# Patient Record
Sex: Male | Born: 1935 | Race: Black or African American | Hispanic: No | Smoking: Former smoker
Health system: Southern US, Community
[De-identification: ages and names within clinical notes are randomized; demographics above are authoritative.]

## PROBLEM LIST (undated history)

## (undated) DIAGNOSIS — C679 Malignant neoplasm of bladder, unspecified: Secondary | ICD-10-CM

## (undated) DIAGNOSIS — R413 Other amnesia: Secondary | ICD-10-CM

## (undated) DIAGNOSIS — M1990 Unspecified osteoarthritis, unspecified site: Secondary | ICD-10-CM

## (undated) DIAGNOSIS — C801 Malignant (primary) neoplasm, unspecified: Secondary | ICD-10-CM

## (undated) DIAGNOSIS — I2699 Other pulmonary embolism without acute cor pulmonale: Secondary | ICD-10-CM

## (undated) DIAGNOSIS — J439 Emphysema, unspecified: Secondary | ICD-10-CM

## (undated) DIAGNOSIS — R42 Dizziness and giddiness: Secondary | ICD-10-CM

## (undated) DIAGNOSIS — N481 Balanitis: Secondary | ICD-10-CM

## (undated) DIAGNOSIS — I440 Atrioventricular block, first degree: Secondary | ICD-10-CM

## (undated) DIAGNOSIS — I719 Aortic aneurysm of unspecified site, without rupture: Secondary | ICD-10-CM

## (undated) DIAGNOSIS — R001 Bradycardia, unspecified: Secondary | ICD-10-CM

## (undated) DIAGNOSIS — R0602 Shortness of breath: Secondary | ICD-10-CM

## (undated) DIAGNOSIS — C61 Malignant neoplasm of prostate: Secondary | ICD-10-CM

## (undated) DIAGNOSIS — R3915 Urgency of urination: Secondary | ICD-10-CM

## (undated) DIAGNOSIS — K449 Diaphragmatic hernia without obstruction or gangrene: Secondary | ICD-10-CM

## (undated) DIAGNOSIS — Z8551 Personal history of malignant neoplasm of bladder: Secondary | ICD-10-CM

## (undated) DIAGNOSIS — Z960 Presence of urogenital implants: Secondary | ICD-10-CM

## (undated) DIAGNOSIS — I82409 Acute embolism and thrombosis of unspecified deep veins of unspecified lower extremity: Secondary | ICD-10-CM

## (undated) HISTORY — DX: Atrioventricular block, first degree: I44.0

## (undated) HISTORY — DX: Bradycardia, unspecified: R00.1

## (undated) HISTORY — DX: Dizziness and giddiness: R42

## (undated) HISTORY — PX: OTHER SURGICAL HISTORY: SHX169

## (undated) HISTORY — DX: Shortness of breath: R06.02

## (undated) HISTORY — DX: Malignant neoplasm of prostate: C61

## (undated) HISTORY — PX: FOOT SURGERY: SHX648

## (undated) HISTORY — DX: Unspecified osteoarthritis, unspecified site: M19.90

## (undated) HISTORY — DX: Balanitis: N48.1

---
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
## 1999-01-01 ENCOUNTER — Ambulatory Visit (HOSPITAL_COMMUNITY): Admission: RE | Admit: 1999-01-01 | Discharge: 1999-01-01 | Payer: Self-pay | Admitting: Family Medicine

## 1999-06-02 ENCOUNTER — Encounter: Admission: RE | Admit: 1999-06-02 | Discharge: 1999-06-02 | Payer: Self-pay | Admitting: Family Medicine

## 1999-06-02 ENCOUNTER — Encounter: Payer: Self-pay | Admitting: Family Medicine

## 2000-02-19 ENCOUNTER — Emergency Department (HOSPITAL_COMMUNITY): Admission: EM | Admit: 2000-02-19 | Discharge: 2000-02-19 | Payer: Self-pay | Admitting: Emergency Medicine

## 2000-02-20 ENCOUNTER — Encounter: Payer: Self-pay | Admitting: Emergency Medicine

## 2000-08-04 ENCOUNTER — Ambulatory Visit (HOSPITAL_COMMUNITY): Admission: RE | Admit: 2000-08-04 | Discharge: 2000-08-04 | Payer: Self-pay | Admitting: Family Medicine

## 2000-08-04 ENCOUNTER — Encounter: Payer: Self-pay | Admitting: Family Medicine

## 2000-08-16 DIAGNOSIS — Z8546 Personal history of malignant neoplasm of prostate: Secondary | ICD-10-CM

## 2000-08-16 HISTORY — DX: Personal history of malignant neoplasm of prostate: Z85.46

## 2000-11-23 ENCOUNTER — Ambulatory Visit (HOSPITAL_COMMUNITY): Admission: RE | Admit: 2000-11-23 | Discharge: 2000-11-23 | Payer: Self-pay | Admitting: Family Medicine

## 2000-11-23 ENCOUNTER — Encounter: Payer: Self-pay | Admitting: Family Medicine

## 2001-01-02 ENCOUNTER — Encounter (INDEPENDENT_AMBULATORY_CARE_PROVIDER_SITE_OTHER): Payer: Self-pay | Admitting: *Deleted

## 2001-01-02 ENCOUNTER — Other Ambulatory Visit: Admission: RE | Admit: 2001-01-02 | Discharge: 2001-01-02 | Payer: Self-pay | Admitting: Urology

## 2001-01-11 ENCOUNTER — Encounter: Payer: Self-pay | Admitting: Urology

## 2001-01-11 ENCOUNTER — Encounter: Admission: RE | Admit: 2001-01-11 | Discharge: 2001-01-11 | Payer: Self-pay | Admitting: Urology

## 2001-01-18 ENCOUNTER — Ambulatory Visit: Admission: RE | Admit: 2001-01-18 | Discharge: 2001-04-18 | Payer: Self-pay | Admitting: Radiation Oncology

## 2001-02-10 ENCOUNTER — Ambulatory Visit (HOSPITAL_COMMUNITY): Admission: RE | Admit: 2001-02-10 | Discharge: 2001-02-10 | Payer: Self-pay | Admitting: Radiation Oncology

## 2001-04-11 ENCOUNTER — Encounter: Payer: Self-pay | Admitting: Urology

## 2001-04-11 ENCOUNTER — Ambulatory Visit (HOSPITAL_COMMUNITY): Admission: RE | Admit: 2001-04-11 | Discharge: 2001-04-11 | Payer: Self-pay | Admitting: Urology

## 2001-06-08 ENCOUNTER — Ambulatory Visit: Admission: RE | Admit: 2001-06-08 | Discharge: 2001-09-06 | Payer: Self-pay | Admitting: Radiation Oncology

## 2006-06-16 ENCOUNTER — Emergency Department (HOSPITAL_COMMUNITY): Admission: EM | Admit: 2006-06-16 | Discharge: 2006-06-16 | Payer: Self-pay | Admitting: Emergency Medicine

## 2006-08-18 ENCOUNTER — Encounter: Admission: RE | Admit: 2006-08-18 | Discharge: 2006-08-18 | Payer: Self-pay | Admitting: Family Medicine

## 2007-11-20 ENCOUNTER — Ambulatory Visit (HOSPITAL_COMMUNITY): Admission: RE | Admit: 2007-11-20 | Discharge: 2007-11-20 | Payer: Self-pay | Admitting: Urology

## 2010-09-06 ENCOUNTER — Encounter: Payer: Self-pay | Admitting: Urology

## 2010-09-07 ENCOUNTER — Encounter: Payer: Self-pay | Admitting: Urology

## 2011-01-01 NOTE — Op Note (Signed)
[facility]  Patient:    KWINTON, MAAHS Visit Number: [id number] MRN: [id number]          Service Type: DSU Location: DAY Attending Physician:  Lindaann Slough Dictated by:   Lindaann Slough, M.D. Proc. Date: 04/11/01 Adm. Date:  04/11/2001   CC:         Maryln Gottron, M.D.   Operative Report  PREOPERATIVE DIAGNOSIS:  Adenocarcinoma of prostate.  POSTOPERATIVE DIAGNOSIS:  Adenocarcinoma of prostate.  OPERATION:  I-125 seed implantation.  SURGEON:  Lindaann Slough, M.D.           Maryln Gottron, M.D.  ANESTHESIA:  General.  INDICATIONS:  The patient is a 77 year old male who was found to have an elevated PSA at 14.77.  Prostate biopsy was positive for adenocarcinoma.  Bone scan is negative for metastatic disease.  Treatment options were discussed with the patient, and he chose to have radiation.  He was evaluated by Dr. Dayton Scrape and was scheduled for combination external beam and seed implantation.  He has completed the external beam radiation treatment, and he is scheduled now for seed implantation.  DESCRIPTION OF PROCEDURE:  The patient was placed in the dorsolithotomy position.  A Foley catheter and a rectal tube were placed in the bladder. Then a transducer was inserted in the rectum.  When the imaged obtained on the ultrasound were similar to the ones obtained on the simulation study, the anchors were placed in the prostate, and the seeds were implanted in the prostate.  This was done under ultrasound and fluoroscopy guidance.  There was good seed distribution.  A total of 101 seeds were implanted in the prostate. Then the Foley catheter was removed.  A flexible cystoscope was then passed in the bladder.  There was on seed in the stand protruding to the prostatic urethra.  This seed was grasped with a grasping forceps and removed.  Three seeds were in that strand.  So, the patient received a total of 98 seeds. there was no evidence  of stone or tumor in the bladder.  The ureteral orifices were in normal position and shape with clear efflux.  The cystoscope was then removed.  A #16 Foley catheter was then reinserted in the bladder.  The patient tolerated the procedure well and left the OR in satisfactory condition to postanesthesia care unit. Dictated by:   Lindaann Slough, M.D. Attending Physician:  Lindaann Slough DD:  04/11/01 TD:  04/11/01 Job: [id number]

## 2013-04-20 DIAGNOSIS — M4696 Unspecified inflammatory spondylopathy, lumbar region: Secondary | ICD-10-CM | POA: Insufficient documentation

## 2013-04-20 DIAGNOSIS — R972 Elevated prostate specific antigen [PSA]: Secondary | ICD-10-CM | POA: Insufficient documentation

## 2013-04-20 DIAGNOSIS — E785 Hyperlipidemia, unspecified: Secondary | ICD-10-CM | POA: Insufficient documentation

## 2013-10-01 ENCOUNTER — Encounter (HOSPITAL_COMMUNITY): Payer: Self-pay | Admitting: Emergency Medicine

## 2013-10-01 ENCOUNTER — Emergency Department (HOSPITAL_COMMUNITY)
Admission: EM | Admit: 2013-10-01 | Discharge: 2013-10-01 | Disposition: A | Discharge status: Home / Self Care | Payer: Commercial Managed Care - HMO | Source: Home / Self Care | Attending: Emergency Medicine | Admitting: Emergency Medicine

## 2013-10-01 DIAGNOSIS — K047 Periapical abscess without sinus: Secondary | ICD-10-CM

## 2013-10-01 DIAGNOSIS — E215 Disorder of parathyroid gland, unspecified: Secondary | ICD-10-CM

## 2013-10-01 MED ORDER — HYDROCODONE-ACETAMINOPHEN 5-325 MG PO TABS
ORAL_TABLET | ORAL | Status: AC
  Filled 2013-10-01: qty 1

## 2013-10-01 MED ORDER — OXYCODONE-ACETAMINOPHEN 5-325 MG PO TABS: ORAL_TABLET | ORAL | Status: DC

## 2013-10-01 MED ORDER — HYDROCODONE-ACETAMINOPHEN 5-325 MG PO TABS
1.0000 | ORAL_TABLET | Freq: Once | ORAL | Status: AC
  Administered 2013-10-01: 1 via ORAL

## 2013-10-01 MED ORDER — CLINDAMYCIN HCL 300 MG PO CAPS: 300.0000 mg | ORAL_CAPSULE | Freq: Four times a day (QID) | ORAL | Status: DC

## 2013-10-01 NOTE — ED Notes (Signed)
C/o right lower tooth pain and swelling.  On set Friday.  Mild relief with tylenol.  Denies drainage and any other symptoms.

## 2013-10-01 NOTE — Discharge Instructions (Signed)
Look up the [employer] for free dental clinics. Http://www.ncdental.org/ncds/Schedule.asp ° °Get there early and be prepared to wait. [employer] and [employer] have dental hygienist schools that provide low cost routine dental care.  ° °Other resources: °[facility] °[address] °[place], [place] °([phone] ° °Patients with Medicaid: °[facility]                     [facility] °[address]                                [address] °Phone:  [phone]                                                  Phone:  [phone] ° °If unable to pay or uninsured, contact:  [facility] or [facility] to become qualified for the adult dental clinic. ° °No matter what dental problem you have, it will not get better unless you get good dental care.  If the tooth is not taken care of, your symptoms will come back in time and you will be visiting us again in the Urgent Care Center with a bad toothache.  So, see your dentist as soon as possible.  If you don't have a dentist, we can give you a list of dentists.  Sometimes the most cost effective treatment is removal of the tooth.  This can be done very inexpensively through one of the low cost Affordable Denture Centers such as the facility on [address] in [place] (1-800-336-8873).  The downside to this is that you will have one less tooth and this can effect your ability to chew. ° °Some other things that can be done for a dental infection include the following: ° °· Rinse your mouth out with hot salt water (1/2 tsp of table salt and a pinch of baking soda in 8 oz of hot water).  You can do this every 2 or 3 hours. °· Avoid cold foods, beverages, and cold air.  This will make your symptoms worse. °· Sleep with your head elevated.  Sleeping flat will cause your gums and oral tissues to swell and make them hurt more.  You can sleep on several pillows.  Even  better is to sleep in a recliner with your head higher than your heart. °· For mild to moderate pain, you can take Tylenol, ibuprofen, or Aleve. °· External application of heat by a heating pad, hot water bottle, or hot wet towel can help with pain and speed healing.  You can do this every 2 to 3 hours. Do not fall asleep on a heating pad since this can cause a burn.  °·  °

## 2013-10-01 NOTE — ED Provider Notes (Signed)
  Chief Complaint   Chief Complaint  Patient presents with  . Dental Pain    History of Present Illness   Connor Morris is a 80 year old male who's had a one-week history of toothache in the right, lower, second bicuspid. There has been no swelling, no purulent drainage, no difficulty swallowing or breathing, no swelling of the tongue floor the mouth. There is pain with chewing on that side. He is able to fully open and close his mouth. He denies any headache, fever, facial pain, neck pain or swelling.  Review of Systems   Other than as noted above, the patient denies any of the following symptoms: Systemic:  No fever, chills,  Or sweats. ENT:  No headache, ear ache, sore throat, nasal congestion, facial pain, or swelling. Lymphatic:  No adenopathy. Lungs:  No coughing, wheezing or shortness of breath.  [facility]   Past medical history, family history, social history, meds, and allergies were reviewed.  He takes medication for his cholesterol.   Physical Examination     Vital signs:  BP 147/65  Pulse 64  Temp(Src) 98.6 F (37 C) (Oral)  Resp 16  SpO2 98% General:  Alert, oriented, in no distress. ENT:  TMs and canals normal.  Nasal mucosa normal. Mouth exam:  Almost all of his molars were missing. The right, lower, second bicuspid appears normal but it's tender to palpation. No swelling of the gingiva, no visible collection of pus. There is no swelling of the floor the mouth the tongue. The pharynx is clear and widely patent. Neck:  No swelling or adenopathy. Lungs:  Breath sounds clear and equal bilaterally.  No wheezes, rales or rhonchi. Heart:  Regular rhythm.  No gallops or murmers. Skin:  Clear, warm and dry.   Course in [facility] [place]   He was given Norco 5/325 one by mouth for pain.  Assessment   The encounter diagnosis was Dental abscess.  Plan   1.  Meds:  The following meds were prescribed:   Discharge Medication List as of 10/01/2013  1:32 PM    START  taking these medications   Details  clindamycin (CLEOCIN) 300 MG capsule Take 1 capsule (300 mg total) by mouth 4 (four) times daily., Starting 10/01/2013, Until Discontinued, Normal    oxyCODONE-acetaminophen (PERCOCET) 5-325 MG per tablet 1 tablet every 6 hours as needed for pain., Print        2.  Patient Education/Counseling:  The patient was given appropriate handouts, self care instructions, and instructed in symptomatic relief. Suggested sleeping with head of bed elevated and hot salt water mouthwash.   3.  Follow up:  The patient was told to follow up if no better in 3 to 4 days, if becoming worse in any way, and given some red flag symptoms such as difficulty swallowing or breathing which would prompt immediate return.  Follow up with a dentist as soon as posssible.     Harden Mo, MD 10/01/13 [phone]

## 2013-12-13 ENCOUNTER — Encounter: Payer: Self-pay | Admitting: Gastroenterology

## 2013-12-13 DIAGNOSIS — F17200 Nicotine dependence, unspecified, uncomplicated: Secondary | ICD-10-CM | POA: Insufficient documentation

## 2013-12-26 ENCOUNTER — Other Ambulatory Visit (HOSPITAL_COMMUNITY): Payer: Self-pay | Admitting: Urology

## 2013-12-26 DIAGNOSIS — C61 Malignant neoplasm of prostate: Secondary | ICD-10-CM

## 2014-01-04 ENCOUNTER — Encounter (HOSPITAL_COMMUNITY)
Admission: RE | Admit: 2014-01-04 | Discharge: 2014-01-04 | Disposition: A | Discharge status: Home / Self Care | Payer: Medicare HMO | Source: Ambulatory Visit | Attending: Urology | Admitting: Urology

## 2014-01-04 DIAGNOSIS — C61 Malignant neoplasm of prostate: Secondary | ICD-10-CM | POA: Insufficient documentation

## 2014-01-04 MED ORDER — TECHNETIUM TC 99M MEDRONATE IV KIT
24.6000 | PACK | Freq: Once | INTRAVENOUS | Status: AC | PRN
  Administered 2014-01-04: 24.6 via INTRAVENOUS

## 2014-01-11 ENCOUNTER — Ambulatory Visit (AMBULATORY_SURGERY_CENTER): Payer: Self-pay

## 2014-01-11 VITALS — Ht 67.0 in | Wt 173.6 lb

## 2014-01-11 DIAGNOSIS — Z1211 Encounter for screening for malignant neoplasm of colon: Secondary | ICD-10-CM

## 2014-01-11 MED ORDER — MOVIPREP 100 G PO SOLR: 1.0000 | Freq: Once | ORAL | Status: DC

## 2014-01-11 NOTE — Progress Notes (Signed)
No allergies to eggs or soy No home oxygen No diet/weight loss meds No experience with anesthesia No email

## 2014-01-16 NOTE — Addendum Note (Signed)
Addended by: Lowry Ram on: 01/16/2014 04:03 PM   Modules accepted: Level of Service

## 2014-01-25 ENCOUNTER — Encounter: Payer: Self-pay | Admitting: Gastroenterology

## 2014-01-25 ENCOUNTER — Ambulatory Visit (AMBULATORY_SURGERY_CENTER): Payer: Medicare HMO | Admitting: Gastroenterology

## 2014-01-25 VITALS — BP 118/62 | HR 62 | Temp 96.3°F | Resp 46 | Ht 67.0 in | Wt 173.0 lb

## 2014-01-25 DIAGNOSIS — Z1211 Encounter for screening for malignant neoplasm of colon: Secondary | ICD-10-CM

## 2014-01-25 MED ORDER — SODIUM CHLORIDE 0.9 % IV SOLN: 500.0000 mL | INTRAVENOUS | Status: DC

## 2014-01-25 NOTE — Progress Notes (Signed)
Report to PACU, RN, vss, BBS= Clear.  

## 2014-01-25 NOTE — Op Note (Signed)
[place]  [employer] [place], [id number]   COLONOSCOPY PROCEDURE REPORT  PATIENT: Connor Morris, Connor Morris  MR#: [id number] BIRTHDATE: Feb 02, 1934 , 87  yrs. old GENDER: Male ENDOSCOPIST: Milus Banister, MD REFERRED WI:OXBDZ Ardeth Perfect, M.D. PROCEDURE DATE:  01/25/2014 PROCEDURE:   Colonoscopy, screening First Screening Colonoscopy - Avg.  risk and is 50 yrs.  old or older - No.  Prior Negative Screening - Now for repeat screening. 10 or more years since last screening  History of Adenoma - Now for follow-up colonoscopy & has been > or = to 3 yrs.  N/A  Polyps Removed Today? No.  Recommend repeat exam, <10 yrs? No. ASA CLASS:   Class II INDICATIONS:average risk screening, colonoscopy 15 years ago per patient was normal MEDICATIONS: MAC sedation, administered by CRNA and propofol (Diprivan) 200mg  IV  DESCRIPTION OF PROCEDURE:   After the risks benefits and alternatives of the procedure were thoroughly explained, informed consent was obtained.  A digital rectal exam revealed no abnormalities of the rectum.   The LB [id number]  endoscope was introduced through the anus and advanced to the cecum, which was identified by both the appendix and ileocecal valve. No adverse events experienced.   The quality of the prep was excellent.  The instrument was then slowly withdrawn as the colon was fully examined.   COLON FINDINGS: A normal appearing cecum, ileocecal valve, and appendiceal orifice were identified.  The ascending, hepatic flexure, transverse, splenic flexure, descending, sigmoid colon and rectum appeared unremarkable.  No polyps or cancers were seen. Retroflexed views revealed no abnormalities. The time to cecum=2 minutes 59 seconds.  Withdrawal time=6 minutes 34 seconds.  The scope was withdrawn and the procedure completed. COMPLICATIONS: There were no complications.  ENDOSCOPIC IMPRESSION: Normal colon No polyps or cancers  RECOMMENDATIONS: Given  your age, you do not need further colon cancer screening. These types of tests usually stop around the age 61-80.   eSigned:  Milus Banister, MD 01/25/2014 9:14 AM

## 2014-01-25 NOTE — Patient Instructions (Signed)
YOU HAD AN ENDOSCOPIC PROCEDURE TODAY AT THE [facility]: Refer to the procedure report that was given to you for any specific questions about what was found during the examination.  If the procedure report does not answer your questions, please call your gastroenterologist to clarify.  If you requested that your care partner not be given the details of your procedure findings, then the procedure report has been included in a sealed envelope for you to review at your convenience later.  YOU SHOULD EXPECT: Some feelings of bloating in the abdomen. Passage of more gas than usual.  Walking can help get rid of the air that was put into your GI tract during the procedure and reduce the bloating. If you had a lower endoscopy (such as a colonoscopy or flexible sigmoidoscopy) you may notice spotting of blood in your stool or on the toilet paper. If you underwent a bowel prep for your procedure, then you may not have a normal bowel movement for a few days.  DIET: Your first meal following the procedure should be a light meal and then it is ok to progress to your normal diet.  A half-sandwich or bowl of soup is an example of a good first meal.  Heavy or fried foods are harder to digest and may make you feel nauseous or bloated.  Likewise meals heavy in dairy and vegetables can cause extra gas to form and this can also increase the bloating.  Drink plenty of fluids but you should avoid alcoholic beverages for 24 hours.  ACTIVITY: Your care partner should take you home directly after the procedure.  You should plan to take it easy, moving slowly for the rest of the day.  You can resume normal activity the day after the procedure however you should NOT DRIVE or use heavy machinery for 24 hours (because of the sedation medicines used during the test).    SYMPTOMS TO REPORT IMMEDIATELY: A gastroenterologist can be reached at any hour.  During normal business hours, 8:30 AM to 5:00 PM Monday through Friday,  call [phone].  After hours and on weekends, please call the GI answering service at [phone] who will take a message and have the physician on call contact you.   Following lower endoscopy (colonoscopy or flexible sigmoidoscopy):  Excessive amounts of blood in the stool  Significant tenderness or worsening of abdominal pains  Swelling of the abdomen that is new, acute  Fever of 100F or higher  FOLLOW UP: If any biopsies were taken you will be contacted by phone or by letter within the next 1-3 weeks.  Call your gastroenterologist if you have not heard about the biopsies in 3 weeks.  Our staff will call the home number listed on your records the next business day following your procedure to check on you and address any questions or concerns that you may have at that time regarding the information given to you following your procedure. This is a courtesy call and so if there is no answer at the home number and we have not heard from you through the emergency physician on call, we will assume that you have returned to your regular daily activities without incident.  SIGNATURES/CONFIDENTIALITY: You and/or your care partner have signed paperwork which will be entered into your electronic medical record.  These signatures attest to the fact that that the information above on your After Visit Summary has been reviewed and is understood.  Full responsibility of the confidentiality of this   discharge information lies with you and/or your care-partner.  Recommendations Given your age, you do not need further colon cancer screening. These types of tests usually stop around age 79-80.

## 2014-01-28 ENCOUNTER — Telehealth: Payer: Self-pay | Admitting: *Deleted

## 2014-01-28 NOTE — Telephone Encounter (Signed)
No identifier, left message, follow-up  

## 2014-08-26 DIAGNOSIS — C61 Malignant neoplasm of prostate: Secondary | ICD-10-CM | POA: Diagnosis not present

## 2014-09-02 DIAGNOSIS — C61 Malignant neoplasm of prostate: Secondary | ICD-10-CM | POA: Diagnosis not present

## 2014-10-08 DIAGNOSIS — H4011X1 Primary open-angle glaucoma, mild stage: Secondary | ICD-10-CM | POA: Diagnosis not present

## 2014-11-05 DIAGNOSIS — H40013 Open angle with borderline findings, low risk, bilateral: Secondary | ICD-10-CM | POA: Diagnosis not present

## 2014-12-09 DIAGNOSIS — Z79899 Other long term (current) drug therapy: Secondary | ICD-10-CM | POA: Diagnosis not present

## 2014-12-09 DIAGNOSIS — E785 Hyperlipidemia, unspecified: Secondary | ICD-10-CM | POA: Diagnosis not present

## 2014-12-09 DIAGNOSIS — R972 Elevated prostate specific antigen [PSA]: Secondary | ICD-10-CM | POA: Diagnosis not present

## 2014-12-16 DIAGNOSIS — Z23 Encounter for immunization: Secondary | ICD-10-CM | POA: Diagnosis not present

## 2014-12-16 DIAGNOSIS — Z8546 Personal history of malignant neoplasm of prostate: Secondary | ICD-10-CM | POA: Diagnosis not present

## 2014-12-16 DIAGNOSIS — E785 Hyperlipidemia, unspecified: Secondary | ICD-10-CM | POA: Diagnosis not present

## 2014-12-16 DIAGNOSIS — R972 Elevated prostate specific antigen [PSA]: Secondary | ICD-10-CM | POA: Diagnosis not present

## 2014-12-16 DIAGNOSIS — Z6828 Body mass index (BMI) 28.0-28.9, adult: Secondary | ICD-10-CM | POA: Diagnosis not present

## 2014-12-16 DIAGNOSIS — Z0000 Encounter for general adult medical examination without abnormal findings: Secondary | ICD-10-CM | POA: Diagnosis not present

## 2014-12-16 DIAGNOSIS — Z87891 Personal history of nicotine dependence: Secondary | ICD-10-CM | POA: Diagnosis not present

## 2014-12-19 DIAGNOSIS — Z1212 Encounter for screening for malignant neoplasm of rectum: Secondary | ICD-10-CM | POA: Diagnosis not present

## 2015-03-03 DIAGNOSIS — C61 Malignant neoplasm of prostate: Secondary | ICD-10-CM | POA: Diagnosis not present

## 2015-05-07 DIAGNOSIS — H40053 Ocular hypertension, bilateral: Secondary | ICD-10-CM | POA: Diagnosis not present

## 2015-10-22 DIAGNOSIS — H40053 Ocular hypertension, bilateral: Secondary | ICD-10-CM | POA: Diagnosis not present

## 2015-12-17 DIAGNOSIS — Z125 Encounter for screening for malignant neoplasm of prostate: Secondary | ICD-10-CM | POA: Diagnosis not present

## 2015-12-17 DIAGNOSIS — E784 Other hyperlipidemia: Secondary | ICD-10-CM | POA: Diagnosis not present

## 2015-12-22 DIAGNOSIS — Z1389 Encounter for screening for other disorder: Secondary | ICD-10-CM | POA: Diagnosis not present

## 2015-12-22 DIAGNOSIS — E784 Other hyperlipidemia: Secondary | ICD-10-CM | POA: Diagnosis not present

## 2015-12-22 DIAGNOSIS — R972 Elevated prostate specific antigen [PSA]: Secondary | ICD-10-CM | POA: Diagnosis not present

## 2015-12-22 DIAGNOSIS — Z6827 Body mass index (BMI) 27.0-27.9, adult: Secondary | ICD-10-CM | POA: Diagnosis not present

## 2015-12-22 DIAGNOSIS — Z8546 Personal history of malignant neoplasm of prostate: Secondary | ICD-10-CM | POA: Diagnosis not present

## 2015-12-22 DIAGNOSIS — Z0000 Encounter for general adult medical examination without abnormal findings: Secondary | ICD-10-CM | POA: Diagnosis not present

## 2015-12-25 DIAGNOSIS — M859 Disorder of bone density and structure, unspecified: Secondary | ICD-10-CM | POA: Diagnosis not present

## 2015-12-25 DIAGNOSIS — M899 Disorder of bone, unspecified: Secondary | ICD-10-CM | POA: Insufficient documentation

## 2016-05-13 DIAGNOSIS — C61 Malignant neoplasm of prostate: Secondary | ICD-10-CM | POA: Diagnosis not present

## 2016-05-20 DIAGNOSIS — C61 Malignant neoplasm of prostate: Secondary | ICD-10-CM | POA: Diagnosis not present

## 2016-05-20 DIAGNOSIS — R351 Nocturia: Secondary | ICD-10-CM | POA: Diagnosis not present

## 2016-05-24 ENCOUNTER — Other Ambulatory Visit: Payer: Self-pay | Admitting: Urology

## 2016-05-24 DIAGNOSIS — C61 Malignant neoplasm of prostate: Secondary | ICD-10-CM

## 2016-06-07 ENCOUNTER — Encounter (HOSPITAL_COMMUNITY)
Admission: RE | Admit: 2016-06-07 | Discharge: 2016-06-07 | Disposition: A | Discharge status: Home / Self Care | Payer: Commercial Managed Care - HMO | Source: Ambulatory Visit | Attending: Urology | Admitting: Urology

## 2016-06-07 DIAGNOSIS — C61 Malignant neoplasm of prostate: Secondary | ICD-10-CM

## 2016-06-07 DIAGNOSIS — Z8546 Personal history of malignant neoplasm of prostate: Secondary | ICD-10-CM | POA: Diagnosis not present

## 2016-06-07 DIAGNOSIS — R972 Elevated prostate specific antigen [PSA]: Secondary | ICD-10-CM | POA: Diagnosis not present

## 2016-06-07 MED ORDER — TECHNETIUM TC 99M MEDRONATE IV KIT: 25.0000 | PACK | Freq: Once | INTRAVENOUS | Status: DC | PRN

## 2016-08-16 DIAGNOSIS — R001 Bradycardia, unspecified: Secondary | ICD-10-CM

## 2016-08-16 HISTORY — DX: Bradycardia, unspecified: R00.1

## 2016-09-07 DIAGNOSIS — I440 Atrioventricular block, first degree: Secondary | ICD-10-CM | POA: Diagnosis not present

## 2016-09-07 DIAGNOSIS — R42 Dizziness and giddiness: Secondary | ICD-10-CM | POA: Diagnosis not present

## 2016-09-07 DIAGNOSIS — R030 Elevated blood-pressure reading, without diagnosis of hypertension: Secondary | ICD-10-CM | POA: Insufficient documentation

## 2016-09-07 DIAGNOSIS — N481 Balanitis: Secondary | ICD-10-CM | POA: Diagnosis not present

## 2016-09-07 DIAGNOSIS — E784 Other hyperlipidemia: Secondary | ICD-10-CM | POA: Diagnosis not present

## 2016-09-07 DIAGNOSIS — R001 Bradycardia, unspecified: Secondary | ICD-10-CM | POA: Diagnosis not present

## 2016-09-14 DIAGNOSIS — R42 Dizziness and giddiness: Secondary | ICD-10-CM | POA: Insufficient documentation

## 2016-09-14 DIAGNOSIS — I440 Atrioventricular block, first degree: Secondary | ICD-10-CM | POA: Insufficient documentation

## 2016-09-14 DIAGNOSIS — R001 Bradycardia, unspecified: Secondary | ICD-10-CM | POA: Insufficient documentation

## 2016-09-14 HISTORY — DX: Atrioventricular block, first degree: I44.0

## 2016-09-16 ENCOUNTER — Ambulatory Visit (INDEPENDENT_AMBULATORY_CARE_PROVIDER_SITE_OTHER): Payer: Medicare HMO | Admitting: Cardiovascular Disease

## 2016-09-16 ENCOUNTER — Encounter: Payer: Self-pay | Admitting: Cardiovascular Disease

## 2016-09-16 ENCOUNTER — Encounter (INDEPENDENT_AMBULATORY_CARE_PROVIDER_SITE_OTHER): Payer: Self-pay

## 2016-09-16 VITALS — BP 118/70 | HR 47 | Ht 68.0 in | Wt 170.0 lb

## 2016-09-16 DIAGNOSIS — R001 Bradycardia, unspecified: Secondary | ICD-10-CM | POA: Insufficient documentation

## 2016-09-16 DIAGNOSIS — R0602 Shortness of breath: Secondary | ICD-10-CM | POA: Diagnosis not present

## 2016-09-16 NOTE — Patient Instructions (Signed)
Medication Instructions:  Your physician recommends that you continue on your current medications as directed. Please refer to the Current Medication list given to you today.   Labwork: None Ordered   Testing/Procedures: Your physician has requested that you have an echocardiogram. Echocardiography is a painless test that uses sound waves to create images of your heart. It provides your doctor with information about the size and shape of your heart and how well your heart's chambers and valves are working. This procedure takes approximately one hour. There are no restrictions for this procedure.    Follow-Up: Your physician recommends that you schedule a follow-up appointment in: 3 months with Dr. Nahser.    If you need a refill on your cardiac medications before your next appointment, please call your pharmacy.   Thank you for choosing [facility]! Gwyneth Fernandez, RN [phone]    

## 2016-09-16 NOTE — Progress Notes (Signed)
Cardiology Office Note   Date:  09/16/2016   ID:  Connor Morris, DOB 08/01/1934, MRN [id number]  PCP:  Velna Hatchet, MD  Cardiologist:   Mertie Moores, MD   Chief Complaint  Patient presents with  . Bradycardia      History of Present Illness: Connor Morris is a 83 y.o. male who presents for Evaluation of his bradycardia.  Seen for the first time , request from [place]  No hx of syncope  No CP or dyspnea   Retired from the [employer] - [employer].  Goes to the [facility] - sometimes 5 days a week .  No CP or dyspnea   Past Medical History:  Diagnosis Date  . Arthritis   . Balanitis   . Prostate cancer ([place]) 2003   seed implants    Past Surgical History:  Procedure Laterality Date  . FOOT SURGERY       Current Outpatient Prescriptions  Medication Sig Dispense Refill  . latanoprost (XALATAN) 0.005 % ophthalmic solution Place 1 drop into both eyes at bedtime.     Marland Kitchen oxyCODONE-acetaminophen (PERCOCET) 5-325 MG per tablet 1 tablet every 6 hours as needed for pain. 20 tablet 0  . pravastatin (PRAVACHOL) 20 MG tablet Take 20 mg by mouth daily.     . valACYclovir (VALTREX) 500 MG tablet Take 500 mg by mouth daily.      No current facility-administered medications for this visit.     Allergies:   Patient has no known allergies.    Social History:  The patient  reports that he quit smoking about 31 years ago. His smoking use included Cigarettes. He has a 20.00 pack-year smoking history. He has never used smokeless tobacco. He reports that he does not drink alcohol or use drugs.   Family History:  The patient's family history includes Diabetes in his sister; Hypertension in his mother.    ROS:  Please see the history of present illness.    Review of Systems: Constitutional:  denies fever, chills, diaphoresis, appetite change and fatigue.  HEENT: denies photophobia, eye pain, redness, hearing loss, ear pain, congestion, sore throat, rhinorrhea,  sneezing, neck pain, neck stiffness and tinnitus.  Respiratory: denies SOB, DOE, cough, chest tightness, and wheezing.  Cardiovascular: denies chest pain, palpitations and leg swelling.  Gastrointestinal: denies nausea, vomiting, abdominal pain, diarrhea, constipation, blood in stool.  Genitourinary: denies dysuria, urgency, frequency, hematuria, flank pain and difficulty urinating.  Musculoskeletal: denies  myalgias, back pain, joint swelling, arthralgias and gait problem.   Skin: denies pallor, rash and wound.  Neurological: denies dizziness, seizures, syncope, weakness, light-headedness, numbness and headaches.   Hematological: denies adenopathy, easy bruising, personal or family bleeding history.  Psychiatric/ Behavioral: denies suicidal ideation, mood changes, confusion, nervousness, sleep disturbance and agitation.       All other systems are reviewed and negative.    PHYSICAL EXAM: VS:  BP 118/70 (BP Location: Left Arm)   Pulse (!) 47   Ht 5\' 8"  (1.727 m)   Wt 170 lb (77.1 kg)   BMI 25.85 kg/m  , BMI Body mass index is 25.85 kg/m. GEN: Well nourished, well developed, in no acute distress  HEENT: normal  Neck: no JVD, carotid bruits, or masses Cardiac: RRR;  Bradycardic .  no murmurs, rubs, or gallops,no edema  Respiratory:  clear to auscultation bilaterally, normal work of breathing GI: soft, nontender, nondistended, + BS MS: no deformity or atrophy  Skin: warm and dry, no rash Neuro:  Strength and  sensation are intact Psych: normal   EKG:  EKG is ordered today. The ekg ordered today demonstrates  Sinus brady at 47.   1st degree AV block , minimal voltage for LVH    Recent Labs: No results found for requested labs within last 8760 hours.    Lipid Panel No results found for: CHOL, TRIG, HDL, CHOLHDL, VLDL, LDLCALC, LDLDIRECT    Wt Readings from Last 3 Encounters:  09/16/16 170 lb (77.1 kg)  01/25/14 173 lb (78.5 kg)  01/11/14 173 lb 9.6 oz (78.7 kg)       Other studies Reviewed: Additional studies/ records that were reviewed today include: . Review of the above records demonstrates:    ASSESSMENT AND PLAN:  1.  Asymptomatic sinus bradycardia:   No CP ,   no syncope or near syncope.  Had some vertigo while raking the yard once   No further evaluation for this sinus bradycardia  at this time.  2. Dyspnea:   Will get an echo.  No angina Will see him in 3 months .     Current medicines are reviewed at length with the patient today.  The patient does not have concerns regarding medicines.  Labs/ tests ordered today include:  No orders of the defined types were placed in this encounter. 2 D echo     Disposition:   FU with me in 3 months      Mertie Moores, MD  09/16/2016 11:36 AM    [facility] [place], [place], [place]  [address] Phone: [phone]; Fax: [phone]

## 2016-10-05 ENCOUNTER — Other Ambulatory Visit (HOSPITAL_COMMUNITY): Payer: Medicare HMO

## 2016-12-03 ENCOUNTER — Other Ambulatory Visit: Payer: Self-pay

## 2016-12-03 ENCOUNTER — Ambulatory Visit (HOSPITAL_COMMUNITY): Payer: Medicare HMO | Attending: Cardiology

## 2016-12-03 DIAGNOSIS — I352 Nonrheumatic aortic (valve) stenosis with insufficiency: Secondary | ICD-10-CM | POA: Insufficient documentation

## 2016-12-03 DIAGNOSIS — R0602 Shortness of breath: Secondary | ICD-10-CM | POA: Diagnosis not present

## 2016-12-03 DIAGNOSIS — R0609 Other forms of dyspnea: Secondary | ICD-10-CM | POA: Diagnosis present

## 2016-12-03 DIAGNOSIS — R001 Bradycardia, unspecified: Secondary | ICD-10-CM | POA: Diagnosis not present

## 2016-12-03 DIAGNOSIS — Z87891 Personal history of nicotine dependence: Secondary | ICD-10-CM | POA: Diagnosis not present

## 2016-12-03 DIAGNOSIS — I517 Cardiomegaly: Secondary | ICD-10-CM | POA: Insufficient documentation

## 2016-12-03 MED ORDER — PERFLUTREN LIPID MICROSPHERE
1.0000 mL | INTRAVENOUS | Status: AC | PRN
  Administered 2016-12-03: 2 mL via INTRAVENOUS

## 2016-12-06 ENCOUNTER — Telehealth: Payer: Self-pay | Admitting: Cardiovascular Disease

## 2016-12-06 NOTE — Telephone Encounter (Signed)
Reviewed results of echo with patient and advised him that Dr. Acie Fredrickson will go through the results in more detail at his office visit on May 1. He verbalized understanding and thanked me for the call.

## 2016-12-06 NOTE — Telephone Encounter (Signed)
New Message     Pt is returning your call about echo results

## 2016-12-14 ENCOUNTER — Encounter: Payer: Self-pay | Admitting: Cardiovascular Disease

## 2016-12-14 ENCOUNTER — Ambulatory Visit (INDEPENDENT_AMBULATORY_CARE_PROVIDER_SITE_OTHER): Payer: Medicare HMO | Admitting: Cardiovascular Disease

## 2016-12-14 VITALS — BP 150/66 | HR 50 | Ht 68.0 in | Wt 174.2 lb

## 2016-12-14 DIAGNOSIS — R42 Dizziness and giddiness: Secondary | ICD-10-CM

## 2016-12-14 DIAGNOSIS — R001 Bradycardia, unspecified: Secondary | ICD-10-CM | POA: Diagnosis not present

## 2016-12-14 NOTE — Patient Instructions (Signed)

## 2016-12-14 NOTE — Progress Notes (Signed)
Cardiology Office Note   Date:  12/14/2016   ID:  Connor Morris, DOB [birth year], MRN [id number]  PCP:  Velna Hatchet, MD  Cardiologist:   Mertie Moores, MD   Chief Complaint  Patient presents with  . Follow-up    sinus brady       History of Present Illness: Connor Morris is a 83 y.o. male who presents for Evaluation of his bradycardia.  Seen for the first time , request from [facility])  No hx of syncope  No CP or dyspnea   Retired from the [employer] - [employer].  Goes to the [facility] - sometimes 5 days a week .  No CP or dyspnea   Dec 14, 2016:  Doing well.  Seen with Connor Morris, daughter.   Occasional dizzy spells - sound like orthostatic hypotension. Also has occasional dizzy spell after walking around the track.  Has occurred 1-2 times this month  Past Medical History:  Diagnosis Date  . Arthritis   . Balanitis   . Prostate cancer ([place]) 2003   seed implants    Past Surgical History:  Procedure Laterality Date  . FOOT SURGERY       Current Outpatient Prescriptions  Medication Sig Dispense Refill  . latanoprost (XALATAN) 0.005 % ophthalmic solution Place 1 drop into both eyes at bedtime.     Marland Kitchen oxyCODONE-acetaminophen (PERCOCET) 5-325 MG per tablet 1 tablet every 6 hours as needed for pain. 20 tablet 0  . pravastatin (PRAVACHOL) 20 MG tablet Take 20 mg by mouth daily.     . valACYclovir (VALTREX) 500 MG tablet Take 500 mg by mouth daily.      No current facility-administered medications for this visit.     Allergies:   Patient has no known allergies.    Social History:  The patient  reports that he quit smoking about 31 years ago. His smoking use included Cigarettes. He has a 20.00 pack-year smoking history. He has never used smokeless tobacco. He reports that he does not drink alcohol or use drugs.   Family History:  The patient's family history includes Diabetes in his sister; Hypertension in his mother.    ROS:  Please see  the history of present illness.    Review of Systems: Constitutional:  denies fever, chills, diaphoresis, appetite change and fatigue.  HEENT: denies photophobia, eye pain, redness, hearing loss, ear pain, congestion, sore throat, rhinorrhea, sneezing, neck pain, neck stiffness and tinnitus.  Respiratory: denies SOB, DOE, cough, chest tightness, and wheezing.  Cardiovascular: denies chest pain, palpitations and leg swelling.  Gastrointestinal: denies nausea, vomiting, abdominal pain, diarrhea, constipation, blood in stool.  Genitourinary: denies dysuria, urgency, frequency, hematuria, flank pain and difficulty urinating.  Musculoskeletal: denies  myalgias, back pain, joint swelling, arthralgias and gait problem.   Skin: denies pallor, rash and wound.  Neurological: denies dizziness, seizures, syncope, weakness, light-headedness, numbness and headaches.   Hematological: denies adenopathy, easy bruising, personal or family bleeding history.  Psychiatric/ Behavioral: denies suicidal ideation, mood changes, confusion, nervousness, sleep disturbance and agitation.       All other systems are reviewed and negative.    PHYSICAL EXAM: VS:  BP (!) 150/66 (BP Location: Left Arm, Patient Position: Sitting, Cuff Size: Normal)   Pulse (!) 50   Ht 5\' 8"  (1.727 m)   Wt 174 lb 3.2 oz (79 kg)   SpO2 97%   BMI 26.49 kg/m  , BMI Body mass index is 26.49 kg/m. GEN: Well nourished, well developed,  in no acute distress  HEENT: normal  Neck: no JVD, carotid bruits, or masses Cardiac: RRR;  Bradycardic .  no murmurs, rubs, or gallops,no edema  Respiratory:  clear to auscultation bilaterally, normal work of breathing GI: soft, nontender, nondistended, + BS MS: no deformity or atrophy  Skin: warm and dry, no rash Neuro:  Strength and sensation are intact Psych: normal   EKG:  EKG is ordered today. The ekg ordered today demonstrates  Sinus brady at 47.   1st degree AV block , minimal voltage for LVH     Recent Labs: No results found for requested labs within last 8760 hours.    Lipid Panel No results found for: CHOL, TRIG, HDL, CHOLHDL, VLDL, LDLCALC, LDLDIRECT    Wt Readings from Last 3 Encounters:  12/14/16 174 lb 3.2 oz (79 kg)  09/16/16 170 lb (77.1 kg)  01/25/14 173 lb (78.5 kg)      Other studies Reviewed: Additional studies/ records that were reviewed today include: . Review of the above records demonstrates:    ASSESSMENT AND PLAN:  1.  Asymptomatic sinus bradycardia:   No CP ,   no syncope or near syncope.   He does have some occasional episodes of orthostatic hypertension. I've advised him to call us if this seems to be getting worse. Had some vertigo while raking the yard once   No further evaluation for this sinus bradycardia  at this time.  2. Dyspnea:   Echocardiogram reveals normal left ventricle systolic function. He has grade 1 diastolic dysfunction. He has mild aortic stenosis. We'll continue to follow.     Current medicines are reviewed at length with the patient today.  The patient does not have concerns regarding medicines.  Labs/ tests ordered today include:  No orders of the defined types were placed in this encounter.   Disposition:   FU with me in 6 months     Mertie Moores, MD  12/14/2016 10:53 AM    [address], [place], [place]  [address] Phone: [phone]; Fax: [phone]

## 2016-12-20 DIAGNOSIS — E784 Other hyperlipidemia: Secondary | ICD-10-CM | POA: Diagnosis not present

## 2016-12-20 DIAGNOSIS — M859 Disorder of bone density and structure, unspecified: Secondary | ICD-10-CM | POA: Diagnosis not present

## 2016-12-20 DIAGNOSIS — Z125 Encounter for screening for malignant neoplasm of prostate: Secondary | ICD-10-CM | POA: Diagnosis not present

## 2016-12-27 DIAGNOSIS — Z1389 Encounter for screening for other disorder: Secondary | ICD-10-CM | POA: Diagnosis not present

## 2016-12-27 DIAGNOSIS — R9721 Rising PSA following treatment for malignant neoplasm of prostate: Secondary | ICD-10-CM | POA: Diagnosis not present

## 2016-12-27 DIAGNOSIS — Z0000 Encounter for general adult medical examination without abnormal findings: Secondary | ICD-10-CM | POA: Diagnosis not present

## 2016-12-27 DIAGNOSIS — E784 Other hyperlipidemia: Secondary | ICD-10-CM | POA: Diagnosis not present

## 2016-12-27 DIAGNOSIS — I440 Atrioventricular block, first degree: Secondary | ICD-10-CM | POA: Diagnosis not present

## 2016-12-27 DIAGNOSIS — Z6827 Body mass index (BMI) 27.0-27.9, adult: Secondary | ICD-10-CM | POA: Diagnosis not present

## 2016-12-27 DIAGNOSIS — R001 Bradycardia, unspecified: Secondary | ICD-10-CM | POA: Diagnosis not present

## 2016-12-27 DIAGNOSIS — R972 Elevated prostate specific antigen [PSA]: Secondary | ICD-10-CM | POA: Diagnosis not present

## 2016-12-27 DIAGNOSIS — M859 Disorder of bone density and structure, unspecified: Secondary | ICD-10-CM | POA: Diagnosis not present

## 2017-02-10 ENCOUNTER — Other Ambulatory Visit: Payer: Self-pay | Admitting: Urology

## 2017-02-10 DIAGNOSIS — R351 Nocturia: Secondary | ICD-10-CM | POA: Diagnosis not present

## 2017-02-10 DIAGNOSIS — C61 Malignant neoplasm of prostate: Secondary | ICD-10-CM | POA: Diagnosis not present

## 2017-02-21 ENCOUNTER — Encounter (HOSPITAL_COMMUNITY)
Admission: RE | Admit: 2017-02-21 | Discharge: 2017-02-21 | Disposition: A | Discharge status: Home / Self Care | Payer: Medicare HMO | Source: Ambulatory Visit | Attending: Urology | Admitting: Urology

## 2017-02-21 DIAGNOSIS — Z8546 Personal history of malignant neoplasm of prostate: Secondary | ICD-10-CM | POA: Diagnosis not present

## 2017-02-21 DIAGNOSIS — C61 Malignant neoplasm of prostate: Secondary | ICD-10-CM | POA: Diagnosis not present

## 2017-02-21 MED ORDER — TECHNETIUM TC 99M MEDRONATE IV KIT
21.9000 | PACK | Freq: Once | INTRAVENOUS | Status: AC | PRN
  Administered 2017-02-21: 21.9 via INTRAVENOUS

## 2017-04-07 DIAGNOSIS — C61 Malignant neoplasm of prostate: Secondary | ICD-10-CM | POA: Diagnosis not present

## 2017-04-07 DIAGNOSIS — R351 Nocturia: Secondary | ICD-10-CM | POA: Diagnosis not present

## 2017-05-02 DIAGNOSIS — H40053 Ocular hypertension, bilateral: Secondary | ICD-10-CM | POA: Diagnosis not present

## 2017-06-21 ENCOUNTER — Encounter: Payer: Self-pay | Admitting: Cardiovascular Disease

## 2017-06-21 ENCOUNTER — Ambulatory Visit (INDEPENDENT_AMBULATORY_CARE_PROVIDER_SITE_OTHER): Payer: Medicare HMO | Admitting: Cardiovascular Disease

## 2017-06-21 VITALS — BP 134/68 | HR 54 | Ht 68.0 in | Wt 173.1 lb

## 2017-06-21 DIAGNOSIS — R001 Bradycardia, unspecified: Secondary | ICD-10-CM

## 2017-06-21 NOTE — Patient Instructions (Signed)

## 2017-06-21 NOTE — Progress Notes (Signed)
Cardiology Office Note   Date:  06/21/2017   ID:  Connor Morris, DOB 05/11/1934, MRN [id number]  PCP:  Velna Hatchet, MD  Cardiologist:   Mertie Moores, MD   Chief Complaint  Patient presents with  . Follow-up    sinus bradycardia      History of Present Illness: Connor Morris is a 83 y.o. male who presents for Evaluation of his bradycardia.  Seen for the first time , request from [facility])  No hx of syncope  No CP or dyspnea   Retired from the [employer] - [employer].  Goes to the [facility] - sometimes 5 days a week .  No CP or dyspnea   Dec 14, 2016:  Doing well.  Seen with [name], daughter.   Occasional dizzy spells - sound like orthostatic hypotension. Also has occasional dizzy spell after walking around the track.  Has occurred 1-2 times this month  Nov. 6 2018:    Doing well ,  No syncope  Felt dizzy walking around the track .  No CP or dyspnea   Past Medical History:  Diagnosis Date  . Arthritis   . Balanitis   . Prostate cancer ([place]) 2003   seed implants    Past Surgical History:  Procedure Laterality Date  . FOOT SURGERY       Current Outpatient Medications  Medication Sig Dispense Refill  . oxyCODONE-acetaminophen (PERCOCET) 5-325 MG per tablet 1 tablet every 6 hours as needed for pain. 20 tablet 0  . pravastatin (PRAVACHOL) 20 MG tablet Take 20 mg by mouth daily.     . valACYclovir (VALTREX) 500 MG tablet Take 500 mg by mouth daily.      No current facility-administered medications for this visit.     Allergies:   Patient has no known allergies.    Social History:  The patient  reports that he quit smoking about 31 years ago. His smoking use included cigarettes. He has a 20.00 pack-year smoking history. he has never used smokeless tobacco. He reports that he does not drink alcohol or use drugs.   Family History:  The patient's family history includes Diabetes in his sister; Hypertension in his mother.     ROS:  Please see the history of present illness.    Review of Systems: Noted in the HPI.  Otherwise negative      All other systems are reviewed and negative.    Physical Exam: Blood pressure 134/68, pulse (!) 54, height 5\' 8"  (1.727 m), weight 173 lb 1.9 oz (78.5 kg), SpO2 98 %.  GEN:  Well nourished, well developed in no acute distress HEENT: Normal NECK: No JVD; No carotid bruits LYMPHATICS: No lymphadenopathy CARDIAC: RR, no murmurs, rubs, gallops RESPIRATORY:  Clear to auscultation without rales, wheezing or rhonchi  ABDOMEN: Soft, non-tender, non-distended MUSCULOSKELETAL:  No edema; No deformity  SKIN: Warm and dry NEUROLOGIC:  Alert and oriented x 3   EKG:     Recent Labs: No results found for requested labs within last 8760 hours.    Lipid Panel No results found for: CHOL, TRIG, HDL, CHOLHDL, VLDL, LDLCALC, LDLDIRECT    Wt Readings from Last 3 Encounters:  06/21/17 173 lb 1.9 oz (78.5 kg)  12/14/16 174 lb 3.2 oz (79 kg)  09/16/16 170 lb (77.1 kg)      Other studies Reviewed: Additional studies/ records that were reviewed today include: . Review of the above records demonstrates:    ASSESSMENT AND PLAN:  1.  Asymptomatic sinus bradycardia:   Doing well.  He has not had any syncope or presyncope. See him again in 1 year.   Current medicines are reviewed at length with the patient today.  The patient does not have concerns regarding medicines.  Labs/ tests ordered today include:  No orders of the defined types were placed in this encounter.       Mertie Moores, MD  06/21/2017 4:05 PM    [facility] [address], [place], [place]  [address] Phone: [phone]; Fax: [phone]

## 2017-07-08 IMAGING — NM NM BONE WHOLE BODY
2 series · 2 of 2 positions shown · non-contrast
Comparison: 01/04/2014 and older studies

CLINICAL DATA: History of prostate carcinoma. PSA is 8.65. No
recent trauma. No bone pain.

EXAM:
NUCLEAR MEDICINE WHOLE BODY BONE SCAN
TECHNIQUE: Whole body anterior and posterior images were obtained approximately
3 hours after intravenous injection of radiopharmaceutical.
RADIOPHARMACEUTICALS:  20.0 mCi 9echnetium-66m MDP IV

[Series 1: wbr_bone_40 whole body · 2.66mm/px · 1 of 1 slices shown (1 of 2)]
[im 1/1]
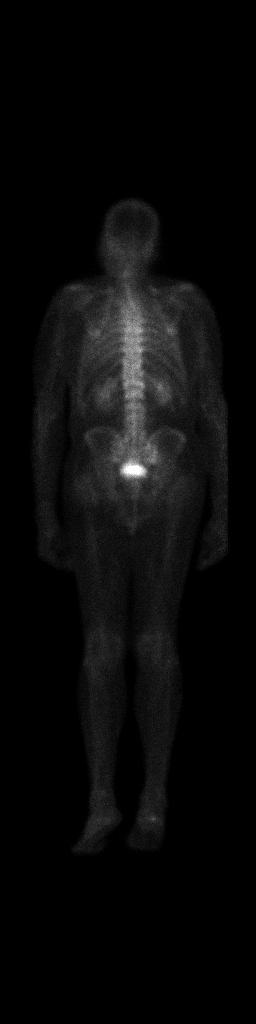

[Series 1: wbr_bone_40 whole body · 2.66mm/px · 1 of 1 slices shown (2 of 2)]
[im 1/1]
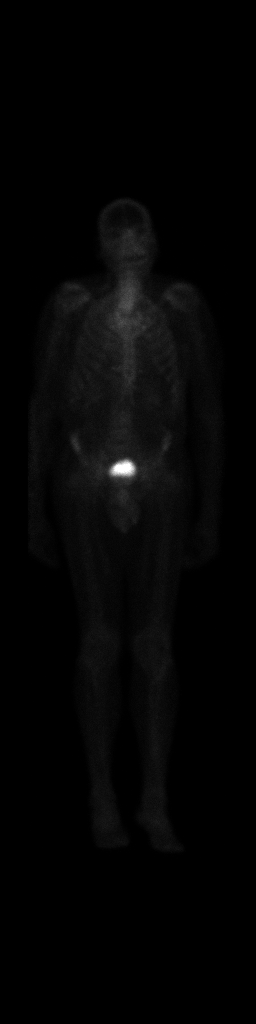

[2 of 2 positions shown; findings below may reference images not displayed]

FINDINGS: There are no areas of radiotracer localization to suggest metastatic
disease to bone.

Patchy degenerative uptake is noted along the thoracolumbar spine.
There is degenerative uptake involving the shoulders,
sternoclavicular joints, knees and feet.

Renal uptake is symmetric.
IMPRESSION: 1. No evidence of metastatic disease to bone. Stable appearance from
the prior exam.

## 2017-10-31 DIAGNOSIS — H40053 Ocular hypertension, bilateral: Secondary | ICD-10-CM | POA: Diagnosis not present

## 2017-11-09 ENCOUNTER — Ambulatory Visit: Payer: Medicare HMO | Admitting: Physician Assistant

## 2017-11-09 DIAGNOSIS — R0989 Other specified symptoms and signs involving the circulatory and respiratory systems: Secondary | ICD-10-CM

## 2017-11-09 NOTE — Progress Notes (Deleted)
Cardiology Office Note    Date:  11/09/2017   ID:  Zaccheaus Storlie, DOB 06-Feb-1934, MRN [id number]  PCP:  Velna Hatchet, MD  Cardiologist: Dr. Acie Fredrickson  Chief Complaint: Dizziness  History of Present Illness:   Connor Morris is a 84 y.o. male with history of bradycardia presents for evaluation of dizziness.  Echocardiogram 11/2016 showed LV function of 42-70%, grade 1 diastolic dysfunction, mild aortic stenosis and mild dilated left atrium.  Patient does have history of occasional dizzy spell that felt like orthostatic hypotension previously.  No syncope or pre-syncopal previously.  He was doing well on cardiac standpoint when last seen by Dr. Acie Fredrickson November 2018.   Past Medical History:  Diagnosis Date  . Arthritis   . Balanitis   . Prostate cancer ([place]) 2003   seed implants    Past Surgical History:  Procedure Laterality Date  . FOOT SURGERY      Current Medications: Prior to Admission medications   Medication Sig Start Date End Date Taking? Authorizing Provider  oxyCODONE-acetaminophen (PERCOCET) 5-325 MG per tablet 1 tablet every 6 hours as needed for pain. 10/01/13   Harden Mo, MD  pravastatin (PRAVACHOL) 20 MG tablet Take 20 mg by mouth daily.  01/12/14   [provider]  valACYclovir (VALTREX) 500 MG tablet Take 500 mg by mouth daily.  01/12/14   [provider]    Allergies:   Patient has no known allergies.   Social History   Socioeconomic History  . Marital status: Divorced    Spouse name: Not on file  . Number of children: Not on file  . Years of education: Not on file  . Highest education level: Not on file  Occupational History  . Not on file  Social Needs  . Financial resource strain: Not on file  . Food insecurity:    Worry: Not on file    Inability: Not on file  . Transportation needs:    Medical: Not on file    Non-medical: Not on file  Tobacco Use  . Smoking status: Former Smoker    Packs/day: 1.00    Years:  20.00    Pack years: 20.00    Types: Cigarettes    Last attempt to quit: 09/14/1985    Years since quitting: 32.1  . Smokeless tobacco: Never Used  Substance and Sexual Activity  . Alcohol use: No  . Drug use: No  . Sexual activity: Never  Lifestyle  . Physical activity:    Days per week: Not on file    Minutes per session: Not on file  . Stress: Not on file  Relationships  . Social connections:    Talks on phone: Not on file    Gets together: Not on file    Attends religious service: Not on file    Active member of club or organization: Not on file    Attends meetings of clubs or organizations: Not on file    Relationship status: Not on file  Other Topics Concern  . Not on file  Social History Narrative  . Not on file     Family History:  The patient's family history includes Diabetes in his sister; Hypertension in his mother. ***  ROS:   Please see the history of present illness.    ROS All other systems reviewed and are negative.   PHYSICAL EXAM:   VS:  There were no vitals taken for this visit.   GEN: Well nourished, well developed, in no  acute distress HEENT: normal Neck: no JVD, carotid bruits, or masses Cardiac: ***RRR; no murmurs, rubs, or gallops,no edema  Respiratory:  clear to auscultation bilaterally, normal work of breathing GI: soft, nontender, nondistended, + BS MS: no deformity or atrophy Skin: warm and dry, no rash Neuro:  Alert and Oriented x 3, Strength and sensation are intact Psych: euthymic mood, full affect  Wt Readings from Last 3 Encounters:  06/21/17 173 lb 1.9 oz (78.5 kg)  12/14/16 174 lb 3.2 oz (79 kg)  09/16/16 170 lb (77.1 kg)      Studies/Labs Reviewed:   EKG:  EKG is ordered today.  The ekg ordered today demonstrates ***  Recent Labs: No results found for requested labs within last 8760 hours.   Lipid Panel No results found for: CHOL, TRIG, HDL, CHOLHDL, VLDL, LDLCALC, LDLDIRECT  Additional studies/ records that were  reviewed today include:   As summarized above   ASSESSMENT & PLAN:    1. Dizziness/bradycardia    Medication Adjustments/Labs and Tests Ordered: Current medicines are reviewed at length with the patient today.  Concerns regarding medicines are outlined above.  Medication changes, Labs and Tests ordered today are listed in the Patient Instructions below. There are no Patient Instructions on file for this visit.   Mahalia Longest [place], [place]  11/09/2017 10:12 AM    [address], [place], [place]  [address] Phone: [phone]; Fax: [phone]

## 2017-11-10 ENCOUNTER — Encounter: Payer: Self-pay | Admitting: Physician Assistant

## 2017-11-11 ENCOUNTER — Encounter (HOSPITAL_COMMUNITY): Payer: Self-pay | Admitting: Emergency Medicine

## 2017-11-11 ENCOUNTER — Ambulatory Visit (HOSPITAL_COMMUNITY)
Admission: EM | Admit: 2017-11-11 | Discharge: 2017-11-11 | Disposition: A | Discharge status: Home / Self Care | Payer: Medicare HMO | Attending: Family Medicine | Admitting: Family Medicine

## 2017-11-11 DIAGNOSIS — N4889 Other specified disorders of penis: Secondary | ICD-10-CM

## 2017-11-11 DIAGNOSIS — A64 Unspecified sexually transmitted disease: Secondary | ICD-10-CM

## 2017-11-11 DIAGNOSIS — Z202 Contact with and (suspected) exposure to infections with a predominantly sexual mode of transmission: Secondary | ICD-10-CM | POA: Diagnosis not present

## 2017-11-11 DIAGNOSIS — Z113 Encounter for screening for infections with a predominantly sexual mode of transmission: Secondary | ICD-10-CM

## 2017-11-11 MED ORDER — VALACYCLOVIR HCL 1 G PO TABS: 1000.0000 mg | ORAL_TABLET | Freq: Three times a day (TID) | ORAL | 0 refills | Status: DC

## 2017-11-11 MED ORDER — CEFTRIAXONE SODIUM 250 MG IJ SOLR
250.0000 mg | Freq: Once | INTRAMUSCULAR | Status: AC
  Administered 2017-11-11: 250 mg via INTRAMUSCULAR

## 2017-11-11 MED ORDER — VALACYCLOVIR HCL 500 MG PO TABS: 500.0000 mg | ORAL_TABLET | Freq: Two times a day (BID) | ORAL | Status: DC

## 2017-11-11 MED ORDER — CEFTRIAXONE SODIUM 250 MG IJ SOLR
INTRAMUSCULAR | Status: AC
  Filled 2017-11-11: qty 250

## 2017-11-11 NOTE — Discharge Instructions (Addendum)
Take meds for 1 week until gone. Use condoms until rash has cleared

## 2017-11-11 NOTE — ED Provider Notes (Addendum)
[facility]    CSN: [id number] Arrival date & time: 11/11/17  1155     History   Chief Complaint Chief Complaint  Patient presents with  . Genital Problem    HPI Connor Morris is a 84 y.o. male.   Patient was exposed to possible STD about 1 month ago and symptoms developed soon thereafter.  He denies any drainage or discharge or pain.  Symptoms are primarily erythema and raw areas on the glans and then slightly proximal to the glans.  He has tried some over-the-counter Neosporin without relief.  HPI  Past Medical History:  Diagnosis Date  . Arthritis   . Balanitis   . Prostate cancer ([place]) 2003   seed implants    Patient Active Problem List   Diagnosis Date Noted  . Sinus bradycardia 09/16/2016  . Shortness of breath 09/16/2016  . Bradycardia 09/14/2016  . 1st degree AV block 09/14/2016  . Dizziness 09/14/2016    Past Surgical History:  Procedure Laterality Date  . FOOT SURGERY         Home Medications    Prior to Admission medications   Medication Sig Start Date End Date Taking? Authorizing Provider  oxyCODONE-acetaminophen (PERCOCET) 5-325 MG per tablet 1 tablet every 6 hours as needed for pain. 10/01/13   Harden Mo, MD  pravastatin (PRAVACHOL) 20 MG tablet Take 20 mg by mouth daily.  01/12/14   [provider]  valACYclovir (VALTREX) 500 MG tablet Take 500 mg by mouth daily.  01/12/14   [provider]    Family History Family History  Problem Relation Age of Onset  . Diabetes Sister   . Hypertension Mother        family history  . Colon cancer Neg Hx     Social History Social History   Tobacco Use  . Smoking status: Former Smoker    Packs/day: 1.00    Years: 20.00    Pack years: 20.00    Types: Cigarettes    Last attempt to quit: 09/14/1985    Years since quitting: 32.1  . Smokeless tobacco: Never Used  Substance Use Topics  . Alcohol use: No  . Drug use: No     Allergies   Patient has no known  allergies.   Review of Systems Review of Systems  Constitutional: Negative.   HENT: Negative.   Cardiovascular: Negative.   Genitourinary: Positive for genital sores and penile swelling.  Neurological: Negative.      Physical Exam Triage Vital Signs ED Triage Vitals  Enc Vitals Group     BP 11/11/17 1226 (!) 146/53     Pulse Rate 11/11/17 1226 62     Resp 11/11/17 1226 18     Temp 11/11/17 1226 98.4 F (36.9 C)     Temp src --      SpO2 11/11/17 1226 100 %     Weight --      Height --      Head Circumference --      Peak Flow --      Pain Score 11/11/17 1228 0     Pain Loc --      Pain Edu? --      Excl. in [place]? --    No data found.  Updated Vital Signs BP (!) 146/53   Pulse 62   Temp 98.4 F (36.9 C)   Resp 18   SpO2 100%   Visual Acuity Right Eye Distance:   Left Eye Distance:  Bilateral Distance:    Right Eye Near:   Left Eye Near:    Bilateral Near:     Physical Exam   UC Treatments / Results  Labs (all labs ordered are listed, but only abnormal results are displayed) Labs Reviewed - No data to display  EKG None Radiology No results found.  Procedures Procedures (including critical care time)  Medications Ordered in UC Medications - No data to display   Initial Impression / Assessment and Plan / UC Course  I have reviewed the triage vital signs and the nursing notes.  Pertinent labs & imaging results that were available during my care of the patient were reviewed by me and considered in my medical decision making (see chart for details).     STD.  This could be herpetic infection but it is not associated with pain.  Other possibility is yeast. Will cover with Rocephin but also prescription for Valtrex  Final Clinical Impressions(s) / UC Diagnoses   Final diagnoses:  None    ED Discharge Orders    None       Controlled Substance Prescriptions [place] Controlled Substance Registry consulted? Not Applicable   Wardell Honour, MD 11/11/17 1310    Wardell Honour, MD 11/11/17 1325

## 2017-11-11 NOTE — ED Triage Notes (Addendum)
Pt states he messed around with a girl a month ago, states "down there is a different color" Pt concerned for STD. Pt poor historian. Denies pain. Denies burning with urination. Denies penile discharge

## 2017-11-23 ENCOUNTER — Encounter: Payer: Self-pay | Admitting: Physician Assistant

## 2017-11-23 ENCOUNTER — Ambulatory Visit: Payer: Medicare HMO | Admitting: Physician Assistant

## 2017-11-23 VITALS — BP 140/76 | HR 55 | Ht 68.0 in | Wt 172.0 lb

## 2017-11-23 DIAGNOSIS — E782 Mixed hyperlipidemia: Secondary | ICD-10-CM | POA: Diagnosis not present

## 2017-11-23 DIAGNOSIS — R42 Dizziness and giddiness: Secondary | ICD-10-CM | POA: Diagnosis not present

## 2017-11-23 DIAGNOSIS — R001 Bradycardia, unspecified: Secondary | ICD-10-CM

## 2017-11-23 NOTE — Progress Notes (Signed)
Cardiology Office Note    Date:  11/23/2017   ID:  Connor Morris, DOB 10-07-1933, MRN [id number]  PCP:  Velna Hatchet, MD  Cardiologist:  Dr. Acie Fredrickson  Chief Complaint: Dizziness  History of Present Illness:   Connor Morris is a 84 y.o. male with hx of sinus bradycardia and dizzy spells sounds like orthostatics presents for dizziness.   Last seen by Dr. Acie Fredrickson 06/2017. Continues to have dizziness. No work up recommended.  Recently noted worsening dizziness for past 1-2 weeks and added to my schedules. He gets dizziness when trying to stand and initial walking. Resolves in 5 minutes. No syncope, headache, chest pain or sob. Sometime exacerbates with rapid head movement. He goes to [facility] 5 times/week. He walks 2 miles each time without any discomfort except initial dizziness. Denies orthopnea, PND, LE edema, slurred speech or weakness of one side of body. No allergies, ear pain, headache or fever. Not on any AV blocking agent.   Past Medical History:  Diagnosis Date  . 1st degree AV block 09/14/2016  . Arthritis   . Balanitis   . Dizziness 09/14/2016  . Prostate cancer ([place]) 2003   seed implants  . Shortness of breath 09/16/2016  . Sinus bradycardia 09/16/2016    Past Surgical History:  Procedure Laterality Date  . FOOT SURGERY      Current Medications:  Prior to Admission medications   Medication Sig Start Date End Date Taking? Authorizing Provider  clotrimazole (LOTRIMIN) 1 % cream Apply 1 application topically 2 (two) times daily.   Yes [provider]  pravastatin (PRAVACHOL) 20 MG tablet Take 20 mg by mouth daily.  01/12/14  Yes [provider]     Allergies:   Patient has no known allergies.   Social History   Socioeconomic History  . Marital status: Divorced    Spouse name: Not on file  . Number of children: Not on file  . Years of education: Not on file  . Highest education level: Not on file  Occupational History  . Not on file  Social  Needs  . Financial resource strain: Not on file  . Food insecurity:    Worry: Not on file    Inability: Not on file  . Transportation needs:    Medical: Not on file    Non-medical: Not on file  Tobacco Use  . Smoking status: Former Smoker    Packs/day: 1.00    Years: 20.00    Pack years: 20.00    Types: Cigarettes    Last attempt to quit: 09/14/1985    Years since quitting: 32.2  . Smokeless tobacco: Never Used  Substance and Sexual Activity  . Alcohol use: No  . Drug use: No  . Sexual activity: Never  Lifestyle  . Physical activity:    Days per week: Not on file    Minutes per session: Not on file  . Stress: Not on file  Relationships  . Social connections:    Talks on phone: Not on file    Gets together: Not on file    Attends religious service: Not on file    Active member of club or organization: Not on file    Attends meetings of clubs or organizations: Not on file    Relationship status: Not on file  Other Topics Concern  . Not on file  Social History Narrative  . Not on file     Family History:  The patient's family history includes Diabetes in his  sister; Hypertension in his mother.   ROS:   Please see the history of present illness.    ROS All other systems reviewed and are negative.   PHYSICAL EXAM:   VS:  BP 140/76   Pulse (!) 55   Ht 5\' 8"  (1.727 m)   Wt 172 lb (78 kg)   SpO2 96%   BMI 26.15 kg/m    GEN: Well nourished, well developed, in no acute distress  HEENT: normal  Neck: no JVD, carotid bruits, or masses Cardiac: RRR; no murmurs, rubs, or gallops,no edema  Respiratory:  clear to auscultation bilaterally, normal work of breathing GI: soft, nontender, nondistended, + BS MS: no deformity or atrophy  Skin: warm and dry, no rash Neuro:  Alert and Oriented x 3, Strength and sensation are intact Psych: euthymic mood, full affect  Wt Readings from Last 3 Encounters:  11/23/17 172 lb (78 kg)  06/21/17 173 lb 1.9 oz (78.5 kg)  12/14/16 174  lb 3.2 oz (79 kg)      Studies/Labs Reviewed:   EKG:  EKG is ordered today.  The ekg ordered today demonstrates sinus bradycardia at rate of 55 bpm. PR interval of 242ms.   Recent Labs: No results found for requested labs within last 8760 hours.   Lipid Panel No results found for: CHOL, TRIG, HDL, CHOLHDL, VLDL, LDLCALC, LDLDIRECT  Additional studies/ records that were reviewed today include:   As above  ASSESSMENT & PLAN:    1. Dizziness/Sinus bradycardia with 1st degree AV -Patient is not orthostatic by vitals today nor felt dizzy.  Seems his dizziness occurs mostly during rapid head movement.  No syncope.  Denies any allergies or ear pain.  Recommended follow-up with PCP for further evaluation.  Might need meclizine versus vestibular therapy.  He has sinus bradycardia at baseline.  Does not suspect high degree bradycardia.  Discussed 24-hour monitor however patient wants to defer for now until reevaluation by PCP.  Continue walking regimen.  Advised to get balance before movement.  Orthostatic VS for the past 24 hrs:  BP- Lying Pulse- Lying BP- Sitting Pulse- Sitting BP- Standing at 0 minutes Pulse- Standing at 0 minutes  11/23/17 1009 164/68 (!) 48 145/69 58 140/66 56    2. HLD - Continue pravastatin. Followed by PCP    Medication Adjustments/Labs and Tests Ordered: Current medicines are reviewed at length with the patient today.  Concerns regarding medicines are outlined above.  Medication changes, Labs and Tests ordered today are listed in the Patient Instructions below. Patient Instructions  Medication Instructions:   Your physician recommends that you continue on your current medications as directed. Please refer to the Current Medication list given to you today.   If you need a refill on your cardiac medications before your next appointment, please call your pharmacy.  Labwork:  NONE ORDERED  TODAY    Testing/Procedures:  NONE ORDERED  TODAY    Follow-Up:   WITH DR Acie Fredrickson AS SCHEDULED   Any Other Special Instructions Will Be Listed Below (If Applicable).  MAKE SURE YOU CONTACT YOUR PCP FOR DIZZINESS IN NEXT COUPLE OF WEEKS  Jarrett Soho, [place]  11/23/2017 10:24 AM    [address], [place], [place]  [address] Phone: [phone]; Fax: [phone]

## 2017-11-23 NOTE — Patient Instructions (Signed)
Medication Instructions:   Your physician recommends that you continue on your current medications as directed. Please refer to the Current Medication list given to you today.   If you need a refill on your cardiac medications before your next appointment, please call your pharmacy.  Labwork:  NONE ORDERED  TODAY    Testing/Procedures:  NONE ORDERED  TODAY    Follow-Up:  WITH DR Acie Fredrickson AS SCHEDULED   Any Other Special Instructions Will Be Listed Below (If Applicable).  MAKE SURE YOU CONTACT YOUR PCP FOR DIZZINESS IN NEXT COUPLE OF WEEKS

## 2017-12-01 ENCOUNTER — Ambulatory Visit: Payer: Medicare HMO | Admitting: Physician Assistant

## 2017-12-01 ENCOUNTER — Ambulatory Visit (HOSPITAL_COMMUNITY)
Admission: EM | Admit: 2017-12-01 | Discharge: 2017-12-01 | Disposition: A | Discharge status: Home / Self Care | Payer: Medicare HMO | Attending: Family Medicine | Admitting: Family Medicine

## 2017-12-01 ENCOUNTER — Encounter (HOSPITAL_COMMUNITY): Payer: Self-pay | Admitting: Family Medicine

## 2017-12-01 DIAGNOSIS — R21 Rash and other nonspecific skin eruption: Secondary | ICD-10-CM | POA: Diagnosis present

## 2017-12-01 DIAGNOSIS — Z87891 Personal history of nicotine dependence: Secondary | ICD-10-CM | POA: Insufficient documentation

## 2017-12-01 DIAGNOSIS — N481 Balanitis: Secondary | ICD-10-CM | POA: Diagnosis not present

## 2017-12-01 MED ORDER — CLOTRIMAZOLE 1 % EX CREA: TOPICAL_CREAM | CUTANEOUS | 0 refills | Status: DC

## 2017-12-01 NOTE — ED Triage Notes (Signed)
Pt here for rash to penis. He was seen here a month ago and reports that it has worsened.

## 2017-12-01 NOTE — ED Provider Notes (Signed)
[place]    CSN: [id number] Arrival date & time: 12/01/17  1035     History   Chief Complaint Chief Complaint  Patient presents with  . Rash    HPI Connor Morris is a 84 y.o. male.   84 year old male, with history of prostate cancer, presenting today complaining of redness to the head of his penis.  Patient was seen about a month ago at this location for the same and was given IM Rocephin and Valtrex.  States his symptoms have not improved since that time.  He denies any associated pain.  The history is provided by the patient.  Rash  Location:  Pelvis Pelvic rash location:  Penis Quality: redness and swelling   Quality: not blistering, not bruising, not burning, not draining, not dry, not itchy, not painful and not peeling   Severity:  Moderate Onset quality:  Gradual Duration:  1 month Timing:  Constant Progression:  Unchanged Chronicity:  New Context: not animal contact, not chemical exposure, not diapers, not eggs, not exposure to similar rash and not food   Relieved by:  Nothing Worsened by:  Nothing Ineffective treatments: valtrex  Associated symptoms: no abdominal pain, no diarrhea, no fatigue, no fever, no headaches, no hoarse voice, no induration, no joint pain, no myalgias, no nausea, no periorbital edema, no shortness of breath, no sore throat and not vomiting     Past Medical History:  Diagnosis Date  . 1st degree AV block 09/14/2016  . Arthritis   . Balanitis   . Dizziness 09/14/2016  . Prostate cancer ([place]) 2003   seed implants  . Shortness of breath 09/16/2016  . Sinus bradycardia 09/16/2016    Patient Active Problem List   Diagnosis Date Noted  . Sinus bradycardia 09/16/2016  . Shortness of breath 09/16/2016  . Bradycardia 09/14/2016  . 1st degree AV block 09/14/2016  . Dizziness 09/14/2016    Past Surgical History:  Procedure Laterality Date  . FOOT SURGERY         Home Medications    Prior to Admission medications     Medication Sig Start Date End Date Taking? Authorizing Provider  clotrimazole (LOTRIMIN) 1 % cream Apply to affected area 2 times daily 12/01/17   Marielys Trinidad C, PA-C  pravastatin (PRAVACHOL) 20 MG tablet Take 20 mg by mouth daily.  01/12/14   [provider]    Family History Family History  Problem Relation Age of Onset  . Diabetes Sister   . Hypertension Mother        family history  . Colon cancer Neg Hx     Social History Social History   Tobacco Use  . Smoking status: Former Smoker    Packs/day: 1.00    Years: 20.00    Pack years: 20.00    Types: Cigarettes    Last attempt to quit: 09/14/1985    Years since quitting: 32.2  . Smokeless tobacco: Never Used  Substance Use Topics  . Alcohol use: No  . Drug use: No     Allergies   Patient has no known allergies.   Review of Systems Review of Systems  Constitutional: Negative for chills, fatigue and fever.  HENT: Negative for ear pain, hoarse voice and sore throat.   Eyes: Negative for pain and visual disturbance.  Respiratory: Negative for cough and shortness of breath.   Cardiovascular: Negative for chest pain and palpitations.  Gastrointestinal: Negative for abdominal pain, diarrhea, nausea and vomiting.  Genitourinary: Positive for penile  swelling. Negative for dysuria and hematuria.  Musculoskeletal: Negative for arthralgias, back pain and myalgias.  Skin: Positive for rash. Negative for color change.  Neurological: Negative for seizures, syncope and headaches.  All other systems reviewed and are negative.    Physical Exam Triage Vital Signs ED Triage Vitals  Enc Vitals Group     BP 12/01/17 1107 (!) 151/96     Pulse Rate 12/01/17 1107 63     Resp 12/01/17 1107 18     Temp 12/01/17 1107 98.5 F (36.9 C)     Temp src --      SpO2 12/01/17 1107 96 %     Weight --      Height --      Head Circumference --      Peak Flow --      Pain Score 12/01/17 1105 0     Pain Loc --      Pain Edu? --       Excl. in [place]? --    No data found.  Updated Vital Signs BP (!) 151/96   Pulse 63   Temp 98.5 F (36.9 C)   Resp 18   SpO2 96%   Visual Acuity Right Eye Distance:   Left Eye Distance:   Bilateral Distance:    Right Eye Near:   Left Eye Near:    Bilateral Near:     Physical Exam  Constitutional: He appears well-developed and well-nourished.  HENT:  Head: Normocephalic and atraumatic.  Eyes: Conjunctivae are normal.  Neck: Neck supple.  Cardiovascular: Normal rate and regular rhythm.  No murmur heard. Pulmonary/Chest: Effort normal and breath sounds normal. No respiratory distress.  Abdominal: Soft. There is no tenderness.  Genitourinary: Uncircumcised.  Genitourinary Comments: Erythema to the head of the penis with an area of excoriation to the right side of the head of the penis.   Musculoskeletal: He exhibits no edema.  Neurological: He is alert.  Skin: Skin is warm and dry.  Psychiatric: He has a normal mood and affect.  Nursing note and vitals reviewed.    UC Treatments / Results  Labs (all labs ordered are listed, but only abnormal results are displayed) Labs Reviewed  RPR    EKG None Radiology No results found.  Procedures Procedures (including critical care time)  Medications Ordered in UC Medications - No data to display   Initial Impression / Assessment and Plan / UC Course  I have reviewed the triage vital signs and the nursing notes.  Pertinent labs & imaging results that were available during my care of the patient were reviewed by me and considered in my medical decision making (see chart for details).     Patient uncircumcised. Erythema to the glans. Will treat with clotrimazole for balanitis   Final Clinical Impressions(s) / UC Diagnoses   Final diagnoses:  Balanitis    ED Discharge Orders        Ordered    clotrimazole (LOTRIMIN) 1 % cream     12/01/17 1118       Controlled Substance Prescriptions [place] Controlled  Substance Registry consulted? Not Applicable   Phebe Colla, [place] 12/01/17 1228

## 2017-12-02 LAB — RPR: RPR Ser Ql: NONREACTIVE

## 2017-12-13 ENCOUNTER — Encounter: Payer: Self-pay | Admitting: Cardiovascular Disease

## 2017-12-22 DIAGNOSIS — Z125 Encounter for screening for malignant neoplasm of prostate: Secondary | ICD-10-CM | POA: Diagnosis not present

## 2017-12-22 DIAGNOSIS — E7849 Other hyperlipidemia: Secondary | ICD-10-CM | POA: Diagnosis not present

## 2017-12-22 DIAGNOSIS — R82998 Other abnormal findings in urine: Secondary | ICD-10-CM | POA: Diagnosis not present

## 2017-12-22 DIAGNOSIS — M859 Disorder of bone density and structure, unspecified: Secondary | ICD-10-CM | POA: Diagnosis not present

## 2017-12-26 DIAGNOSIS — Z1212 Encounter for screening for malignant neoplasm of rectum: Secondary | ICD-10-CM | POA: Diagnosis not present

## 2018-01-02 ENCOUNTER — Encounter: Payer: Self-pay | Admitting: Cardiovascular Disease

## 2018-01-02 ENCOUNTER — Ambulatory Visit: Payer: Medicare HMO | Admitting: Cardiovascular Disease

## 2018-01-02 VITALS — BP 134/52 | HR 51 | Ht 68.0 in | Wt 170.0 lb

## 2018-01-02 DIAGNOSIS — R001 Bradycardia, unspecified: Secondary | ICD-10-CM | POA: Diagnosis not present

## 2018-01-02 NOTE — Progress Notes (Signed)
Cardiology Office Note   Date:  01/02/2018   ID:  Connor Morris, DOB 05/04/1934, MRN [id number]  PCP:  Velna Hatchet, MD  Cardiologist:   Mertie Moores, MD   Chief Complaint  Patient presents with  . Bradycardia        Connor Morris is a 84 y.o. male who presents for Evaluation of his bradycardia.  Seen for the first time , request from [facility])  No hx of syncope  No CP or dyspnea   Retired from the [employer] - [employer].  Goes to the [facility] - sometimes 5 days a week .  No CP or dyspnea   Dec 14, 2016:  Doing well.  Seen with Connor Morris, daughter.   Occasional dizzy spells - sound like orthostatic hypotension. Also has occasional dizzy spell after walking around the track.  Has occurred 1-2 times this month  Nov. 6 2018:    Doing well ,  No syncope  Felt dizzy walking around the track .  No CP or dyspnea   Jan 02, 2018: Connor Morris He also has a history of hyperlipidemia.  Seen today for follow-up of his bradycardia. No syncope or presyncope ,  Had a remote episode of dizziness   Past Medical History:  Diagnosis Date  . 1st degree AV block 09/14/2016  . Arthritis   . Balanitis   . Dizziness 09/14/2016  . Prostate cancer ([place]) 2003   seed implants  . Shortness of breath 09/16/2016  . Sinus bradycardia 09/16/2016    Past Surgical History:  Procedure Laterality Date  . FOOT SURGERY       Current Outpatient Medications  Medication Sig Dispense Refill  . clotrimazole (LOTRIMIN) 1 % cream Apply to affected area 2 times daily 40 g 0  . pravastatin (PRAVACHOL) 20 MG tablet Take 20 mg by mouth daily.      No current facility-administered medications for this visit.     Allergies:   Patient has no known allergies.    Social History:  The patient  reports that he quit smoking about 32 years ago. His smoking use included cigarettes. He has a 20.00 pack-year smoking history. He has never used smokeless tobacco. He reports that he  does not drink alcohol or use drugs.   Family History:  The patient's family history includes Diabetes in his sister; Hypertension in his mother.    ROS:  Please see the history of present illness.    Review of Systems: Noted in current history, otherwise review of systems is negative.  Physical Exam: Blood pressure (!) 134/52, pulse (!) 51, height 5\' 8"  (1.727 m), weight 170 lb (77.1 kg), SpO2 97 %.  GEN:   Elderly , frail appearing male,  NAD  HEENT: Normal NECK: No JVD; No carotid bruits LYMPHATICS: No lymphadenopathy CARDIAC: RR , bradycardia  RESPIRATORY:  Clear to auscultation without rales, wheezing or rhonchi  ABDOMEN: Soft, non-tender, non-distended MUSCULOSKELETAL:  No edema; No deformity  SKIN: Warm and dry NEUROLOGIC:       EKG:     Recent Labs: No results found for requested labs within last 8760 hours.    Lipid Panel No results found for: CHOL, TRIG, HDL, CHOLHDL, VLDL, LDLCALC, LDLDIRECT    Wt Readings from Last 3 Encounters:  01/02/18 170 lb (77.1 kg)  11/23/17 172 lb (78 kg)  06/21/17 173 lb 1.9 oz (78.5 kg)      Other studies Reviewed: Additional studies/ records that were reviewed today include: .  Review of the above records demonstrates:    ASSESSMENT AND PLAN:  1.  Asymptomatic sinus bradycardia:   Connor Morris continues to have episodes of asymptomatic sinus bradycardia.  He denies any episodes of syncope or presyncope.  He had some dizziness a year or so ago but has not had any recurrent problems.  At this point, we will continue to monitor.  There is no indication for pacemaker at this time.  I will see him back in 1 year for follow-up visit.     Current medicines are reviewed at length with the patient today.  The patient does not have concerns regarding medicines.  Labs/ tests ordered today include:  No orders of the defined types were placed in this encounter.       Mertie Moores, MD  01/02/2018 11:43 AM    [address], [place], [place]  [address] Phone: [phone]; Fax: [phone]

## 2018-01-02 NOTE — Patient Instructions (Signed)
Medication Instructions:  Your physician recommends that you continue on your current medications as directed. Please refer to the Current Medication list given to you today.   Labwork: None Ordered   Testing/Procedures: None Ordered   Follow-Up: Your physician wants you to follow-up in: 1 year with Dr. Nahser.  You will receive a reminder letter in the mail two months in advance. If you don't receive a letter, please call our office to schedule the follow-up appointment.   If you need a refill on your cardiac medications before your next appointment, please call your pharmacy.   Thank you for choosing [facility]! Iniko Robles, RN [phone]    

## 2018-03-16 DIAGNOSIS — I714 Abdominal aortic aneurysm, without rupture, unspecified: Secondary | ICD-10-CM

## 2018-03-16 DIAGNOSIS — Z86711 Personal history of pulmonary embolism: Secondary | ICD-10-CM

## 2018-03-16 DIAGNOSIS — Z86718 Personal history of other venous thrombosis and embolism: Secondary | ICD-10-CM

## 2018-03-16 DIAGNOSIS — Z7901 Long term (current) use of anticoagulants: Secondary | ICD-10-CM

## 2018-03-16 HISTORY — DX: Personal history of other venous thrombosis and embolism: Z86.718

## 2018-03-16 HISTORY — DX: Abdominal aortic aneurysm, without rupture, unspecified: I71.40

## 2018-03-16 HISTORY — DX: Long term (current) use of anticoagulants: Z79.01

## 2018-03-16 HISTORY — DX: Abdominal aortic aneurysm, without rupture: I71.4

## 2018-03-16 HISTORY — DX: Personal history of pulmonary embolism: Z86.711

## 2018-04-04 ENCOUNTER — Encounter (HOSPITAL_COMMUNITY): Payer: Self-pay | Admitting: Emergency Medicine

## 2018-04-04 ENCOUNTER — Observation Stay (HOSPITAL_COMMUNITY)
Admission: EM | Admit: 2018-04-04 | Discharge: 2018-04-06 | Disposition: A | Discharge status: Home / Self Care | Payer: Medicare HMO | Attending: Internal Medicine | Admitting: Internal Medicine

## 2018-04-04 ENCOUNTER — Emergency Department (HOSPITAL_COMMUNITY): Payer: Medicare HMO

## 2018-04-04 DIAGNOSIS — M1990 Unspecified osteoarthritis, unspecified site: Secondary | ICD-10-CM | POA: Insufficient documentation

## 2018-04-04 DIAGNOSIS — Z7901 Long term (current) use of anticoagulants: Secondary | ICD-10-CM | POA: Insufficient documentation

## 2018-04-04 DIAGNOSIS — R001 Bradycardia, unspecified: Secondary | ICD-10-CM | POA: Insufficient documentation

## 2018-04-04 DIAGNOSIS — Z8546 Personal history of malignant neoplasm of prostate: Secondary | ICD-10-CM

## 2018-04-04 DIAGNOSIS — I712 Thoracic aortic aneurysm, without rupture: Secondary | ICD-10-CM | POA: Diagnosis not present

## 2018-04-04 DIAGNOSIS — Z833 Family history of diabetes mellitus: Secondary | ICD-10-CM | POA: Insufficient documentation

## 2018-04-04 DIAGNOSIS — I2699 Other pulmonary embolism without acute cor pulmonale: Secondary | ICD-10-CM | POA: Diagnosis not present

## 2018-04-04 DIAGNOSIS — I440 Atrioventricular block, first degree: Secondary | ICD-10-CM | POA: Diagnosis not present

## 2018-04-04 DIAGNOSIS — I7121 Aneurysm of the ascending aorta, without rupture: Secondary | ICD-10-CM | POA: Diagnosis present

## 2018-04-04 DIAGNOSIS — Z8249 Family history of ischemic heart disease and other diseases of the circulatory system: Secondary | ICD-10-CM | POA: Diagnosis not present

## 2018-04-04 DIAGNOSIS — Z79899 Other long term (current) drug therapy: Secondary | ICD-10-CM | POA: Diagnosis not present

## 2018-04-04 DIAGNOSIS — Z87891 Personal history of nicotine dependence: Secondary | ICD-10-CM | POA: Insufficient documentation

## 2018-04-04 DIAGNOSIS — R0789 Other chest pain: Secondary | ICD-10-CM | POA: Diagnosis not present

## 2018-04-04 HISTORY — DX: Aortic aneurysm of unspecified site, without rupture: I71.9

## 2018-04-04 HISTORY — DX: Other pulmonary embolism without acute cor pulmonale: I26.99

## 2018-04-04 HISTORY — DX: Acute embolism and thrombosis of unspecified deep veins of unspecified lower extremity: I82.409

## 2018-04-04 LAB — BASIC METABOLIC PANEL
Anion gap: 8 (ref 5–15)
BUN: 13 mg/dL (ref 8–23)
CALCIUM: 9.8 mg/dL (ref 8.9–10.3)
CO2: 24 mmol/L (ref 22–32)
CREATININE: 1.07 mg/dL (ref 0.61–1.24)
Chloride: 104 mmol/L (ref 98–111)
GFR calc non Af Amer: >60 mL/min (ref >60)
Glucose, Bld: 101 mg/dL — ABNORMAL HIGH (ref 70–99)
Potassium: 4.1 mmol/L (ref 3.5–5.1)
SODIUM: 136 mmol/L (ref 135–145)

## 2018-04-04 LAB — CBC
HCT: 44 % (ref 39.0–52.0)
Hemoglobin: 13.9 g/dL (ref 13.0–17.0)
MCH: 29.3 pg (ref 26.0–34.0)
MCHC: 31.6 g/dL (ref 30.0–36.0)
MCV: 92.6 fL (ref 78.0–100.0)
PLATELETS: 193 10*3/uL (ref 150–400)
RBC: 4.75 MIL/uL (ref 4.22–5.81)
RDW: 11.7 % (ref 11.5–15.5)
WBC: 8.1 10*3/uL (ref 4.0–10.5)

## 2018-04-04 LAB — I-STAT TROPONIN, ED: TROPONIN I, POC: 0.01 ng/mL (ref 0.00–0.08)

## 2018-04-04 MED ORDER — NITROGLYCERIN 0.4 MG SL SUBL
0.4000 mg | SUBLINGUAL_TABLET | SUBLINGUAL | Status: DC | PRN
  Administered 2018-04-05 (×2): 0.4 mg via SUBLINGUAL
  Filled 2018-04-04: qty 1

## 2018-04-04 MED ORDER — ASPIRIN 81 MG PO CHEW
324.0000 mg | CHEWABLE_TABLET | Freq: Once | ORAL | Status: AC
Start: 2018-04-04 — End: 2018-04-05
  Administered 2018-04-05: 324 mg via ORAL
  Filled 2018-04-04: qty 4

## 2018-04-04 NOTE — ED Triage Notes (Signed)
Patient reports left chest pain with mild SOB onset 2 days ago , denies emesis or diaphoresis , described pain as " sore" .

## 2018-04-05 ENCOUNTER — Encounter (HOSPITAL_COMMUNITY): Payer: Self-pay | Admitting: General Practice

## 2018-04-05 ENCOUNTER — Observation Stay (HOSPITAL_BASED_OUTPATIENT_CLINIC_OR_DEPARTMENT_OTHER): Payer: Medicare HMO

## 2018-04-05 ENCOUNTER — Emergency Department (HOSPITAL_COMMUNITY): Payer: Medicare HMO

## 2018-04-05 ENCOUNTER — Other Ambulatory Visit: Payer: Self-pay

## 2018-04-05 ENCOUNTER — Observation Stay (HOSPITAL_COMMUNITY): Payer: Medicare HMO

## 2018-04-05 DIAGNOSIS — Z8546 Personal history of malignant neoplasm of prostate: Secondary | ICD-10-CM | POA: Diagnosis not present

## 2018-04-05 DIAGNOSIS — I7121 Aneurysm of the ascending aorta, without rupture: Secondary | ICD-10-CM | POA: Diagnosis present

## 2018-04-05 DIAGNOSIS — I2699 Other pulmonary embolism without acute cor pulmonale: Secondary | ICD-10-CM | POA: Diagnosis not present

## 2018-04-05 DIAGNOSIS — I712 Thoracic aortic aneurysm, without rupture: Secondary | ICD-10-CM

## 2018-04-05 DIAGNOSIS — M7989 Other specified soft tissue disorders: Secondary | ICD-10-CM | POA: Diagnosis not present

## 2018-04-05 HISTORY — DX: Other pulmonary embolism without acute cor pulmonale: I26.99

## 2018-04-05 LAB — PSA: PROSTATIC SPECIFIC ANTIGEN: 11.93 ng/mL — AB (ref 0.00–4.00)

## 2018-04-05 LAB — D-DIMER, QUANTITATIVE: D-Dimer, Quant: 5.25 ug/mL-FEU — ABNORMAL HIGH (ref 0.00–0.50)

## 2018-04-05 LAB — I-STAT TROPONIN, ED: Troponin i, poc: 0.01 ng/mL (ref 0.00–0.08)

## 2018-04-05 MED ORDER — ENOXAPARIN SODIUM 80 MG/0.8ML ~~LOC~~ SOLN
80.0000 mg | Freq: Two times a day (BID) | SUBCUTANEOUS | Status: DC
  Administered 2018-04-05: 80 mg via SUBCUTANEOUS
  Filled 2018-04-05 (×2): qty 0.8

## 2018-04-05 MED ORDER — IOPAMIDOL (ISOVUE-370) INJECTION 76%
100.0000 mL | Freq: Once | INTRAVENOUS | Status: AC | PRN
  Administered 2018-04-05: 66 mL via INTRAVENOUS

## 2018-04-05 MED ORDER — RIVAROXABAN 20 MG PO TABS: 20.0000 mg | ORAL_TABLET | Freq: Every day | ORAL | Status: DC

## 2018-04-05 MED ORDER — TRAMADOL HCL 50 MG PO TABS
50.0000 mg | ORAL_TABLET | Freq: Four times a day (QID) | ORAL | Status: DC | PRN
  Administered 2018-04-05 – 2018-04-06 (×2): 50 mg via ORAL
  Filled 2018-04-05 (×2): qty 1

## 2018-04-05 MED ORDER — ONDANSETRON HCL 4 MG/2ML IJ SOLN: 4.0000 mg | Freq: Four times a day (QID) | INTRAMUSCULAR | Status: DC | PRN

## 2018-04-05 MED ORDER — ACETAMINOPHEN 650 MG RE SUPP: 650.0000 mg | Freq: Four times a day (QID) | RECTAL | Status: DC | PRN

## 2018-04-05 MED ORDER — RIVAROXABAN 15 MG PO TABS
15.0000 mg | ORAL_TABLET | Freq: Two times a day (BID) | ORAL | Status: DC
  Administered 2018-04-05 – 2018-04-06 (×2): 15 mg via ORAL
  Filled 2018-04-05 (×2): qty 1

## 2018-04-05 MED ORDER — FENTANYL CITRATE (PF) 100 MCG/2ML IJ SOLN
50.0000 ug | Freq: Once | INTRAMUSCULAR | Status: DC
  Filled 2018-04-05: qty 2

## 2018-04-05 MED ORDER — ACETAMINOPHEN 325 MG PO TABS
650.0000 mg | ORAL_TABLET | Freq: Four times a day (QID) | ORAL | Status: DC | PRN
  Administered 2018-04-05: 650 mg via ORAL
  Filled 2018-04-05: qty 2

## 2018-04-05 MED ORDER — IOPAMIDOL (ISOVUE-370) INJECTION 76%
INTRAVENOUS | Status: AC
  Filled 2018-04-05: qty 100

## 2018-04-05 MED ORDER — ONDANSETRON HCL 4 MG PO TABS: 4.0000 mg | ORAL_TABLET | Freq: Four times a day (QID) | ORAL | Status: DC | PRN

## 2018-04-05 NOTE — Progress Notes (Signed)
Bilateral lower extremity venous duplex completed. Preliminary results - Right no evidence of DVT or Baker's cyst. Left - POSITIVE FOR AN ACUTE DVT NOTED IN THE POPLITEAL VEIN. There is no evidence of a Baker's cyst. Toma Copier, RVS 04/05/2018, 1:42 PM

## 2018-04-05 NOTE — Progress Notes (Signed)
PROGRESS NOTE        PATIENT DETAILS Name: Connor Morris Age: 84 y.o. Sex: male Date of Birth: 1934-03-24 Admit Date: 04/04/2018 Admitting Physician Etta Quill, DO QJJ:HERDEYCX, Nicki Reaper, MD  Brief Narrative: Patient is a 84 y.o. male with prior history of prostate CA status post seed implant/brachii therapy in 2003-presenting with left-sided chest pain, found to have pulmonary embolism.  See below for further details.  Subjective: Continues to have some left-sided pleuritic chest pain.  However appears comfortable.  Assessment/Plan: Pulmonary embolism: Likely the cause of patient's left-sided chest pain-it appears to be a known provoked pulmonary embolism.  Lower extremity Doppler positive for left popliteal vein DVT.  Stop Lovenox-switch over to Xarelto.  Await echocardiogram-if he continues to do well overnight-home tomorrow morning.  Need evaluation by oncology before consideration of discontinuing anticoagulation.  Asymptomatic sinus bradycardia: Followed by cardiology in the outpatient setting.  Avoid rate control agents.  Remote history of prostate cancer: Stable for outpatient follow-up with his urologist/oncologist.  He waited at 11.93.  DVT Prophylaxis: Full dose anticoagulation with Xarelto  Code Status: Full code   Family Communication: None at bedside  Disposition Plan: Remain inpatient  Antimicrobial agents: Anti-infectives (From admission, onward)   None      Procedures: None  CONSULTS:  None  Time spent: 25- minutes-Greater than 50% of this time was spent in counseling, explanation of diagnosis, planning of further management, and coordination of care.  MEDICATIONS: Scheduled Meds: . fentaNYL (SUBLIMAZE) injection  50 mcg Intravenous Once  . rivaroxaban  15 mg Oral BID   Followed by  . [START ON 04/27/2018] rivaroxaban  20 mg Oral Q supper   Continuous Infusions: PRN Meds:.acetaminophen **OR** acetaminophen,  nitroGLYCERIN, ondansetron **OR** ondansetron (ZOFRAN) IV   PHYSICAL EXAM: Vital signs: Vitals:   04/05/18 0400 04/05/18 0415 04/05/18 0500 04/05/18 1356  BP: 118/68 116/65 120/60 (!) 116/59  Pulse: 81 80 82 68  Resp: 15 16 18 18   Temp:   98.1 F (36.7 C) 98.4 F (36.9 C)  TempSrc:   Oral Oral  SpO2: 95% 96% 97% 96%   There were no vitals filed for this visit. There is no height or weight on file to calculate BMI.   General appearance :Awake, alert, not in any distress. Speech Clear. Eyes:Pink conjunctiva HEENT: Atraumatic and Normocephalic Neck: supple, no JVD. No cervical lymphadenopathy. No thyromegaly Resp:Good air entry bilaterally, no added sounds  CVS: S1 S2 regular, no murmurs.  GI: Bowel sounds present, Non tender and not distended with no gaurding, rigidity or rebound.No organomegaly Extremities: B/L Lower Ext shows no edema, both legs are warm to touch Neurology:  speech clear,Non focal, sensation is grossly intact. Psychiatric: Normal judgment and insight. Alert and oriented x 3. Normal mood. Musculoskeletal:No digital cyanosis Skin:No Rash, warm and dry Wounds:N/A  I have personally reviewed following labs and imaging studies  LABORATORY DATA: CBC: Recent Labs  Lab 04/04/18 2027  WBC 8.1  HGB 13.9  HCT 44.0  MCV 92.6  PLT 448    Basic Metabolic Panel: Recent Labs  Lab 04/04/18 2027  NA 136  K 4.1  CL 104  CO2 24  GLUCOSE 101*  BUN 13  CREATININE 1.07  CALCIUM 9.8    GFR: CrCl cannot be calculated (Unknown ideal weight.).  Liver Function Tests: No results for input(s): AST, ALT, ALKPHOS, BILITOT, PROT,  ALBUMIN in the last 168 hours. No results for input(s): LIPASE, AMYLASE in the last 168 hours. No results for input(s): AMMONIA in the last 168 hours.  Coagulation Profile: No results for input(s): INR, PROTIME in the last 168 hours.  Cardiac Enzymes: No results for input(s): CKTOTAL, CKMB, CKMBINDEX, TROPONINI in the last 168  hours.  BNP (last 3 results) No results for input(s): PROBNP in the last 8760 hours.  HbA1C: No results for input(s): HGBA1C in the last 72 hours.  CBG: No results for input(s): GLUCAP in the last 168 hours.  Lipid Profile: No results for input(s): CHOL, HDL, LDLCALC, TRIG, CHOLHDL, LDLDIRECT in the last 72 hours.  Thyroid Function Tests: No results for input(s): TSH, T4TOTAL, FREET4, T3FREE, THYROIDAB in the last 72 hours.  Anemia Panel: No results for input(s): VITAMINB12, FOLATE, FERRITIN, TIBC, IRON, RETICCTPCT in the last 72 hours.  Urine analysis: No results found for: COLORURINE, APPEARANCEUR, LABSPEC, PHURINE, GLUCOSEU, HGBUR, BILIRUBINUR, KETONESUR, PROTEINUR, UROBILINOGEN, NITRITE, LEUKOCYTESUR  Sepsis Labs: Lactic Acid, Venous No results found for: LATICACIDVEN  MICROBIOLOGY: No results found for this or any previous visit (from the past 240 hour(s)).  RADIOLOGY STUDIES/RESULTS: Dg Chest 2 View  Result Date: 04/04/2018 CLINICAL DATA:  Left-sided chest pain and dyspnea onset yesterday. EXAM: CHEST - 2 VIEW COMPARISON:  CT abdomen and pelvis 06/07/2016, CXR 08/18/2006 FINDINGS: Borderline cardiomegaly with tortuous atherosclerotic and ectatic appearing thoracic aorta. Small hiatal hernia is present causing a double density over the cardiac silhouette. Bibasilar atelectasis is seen. On the lateral view, there is redemonstration of 50% compression deformity of T12 and superior endplate central depression of L1. IMPRESSION: 1. Borderline cardiomegaly with aortic atherosclerosis. Bibasilar atelectasis without acute pulmonary disease. 2. Chronic moderate T12 compression deformity with central superior endplate compression of L1. Electronically Signed   By: Ashley Royalty M.D.   On: 04/04/2018 21:11   Ct Angio Chest Pe W/cm &/or Wo Cm  Result Date: 04/05/2018 CLINICAL DATA:  Left-sided chest pain EXAM: CT ANGIOGRAPHY CHEST WITH CONTRAST TECHNIQUE: Multidetector CT imaging of the  chest was performed using the standard protocol during bolus administration of intravenous contrast. Multiplanar CT image reconstructions and MIPs were obtained to evaluate the vascular anatomy. CONTRAST:  47mL ISOVUE-370 IOPAMIDOL (ISOVUE-370) INJECTION 76% COMPARISON:  Chest x-ray 04/04/2018 FINDINGS: Cardiovascular: Satisfactory opacification of the pulmonary arteries to the segmental level. Small nonocclusive emboli with left upper lobe segmental branches. Aneurysmal dilatation of the ascending aorta up to 4.6 cm. Moderate aortic atherosclerosis. Coronary vascular calcification. Mild cardiomegaly. No pericardial effusion Mediastinum/Nodes: Midline trachea. No thyroid mass. No significantly enlarged lymph nodes. Hiatal hernia with mild distal esophageal thickening. Lungs/Pleura: No acute consolidation or effusion. Moderate emphysema. Bronchial wall thickening in the left greater than right lower lobe with atelectasis or mild infiltrate at the left base. No pleural effusion Upper Abdomen: Cyst within the upper pole of the left kidney. No acute abnormality Musculoskeletal: Moderate compression deformity L1. Review of the MIP images confirms the above findings. IMPRESSION: 1. Positive for small acute emboli within segmental left upper lobe pulmonary branch vessels. 2. Aneurysmal dilatation of the ascending aorta up to 4.6 cm. Ascending thoracic aortic aneurysm. Recommend semi-annual imaging followup by CTA or MRA and referral to cardiothoracic surgery if not already obtained. This recommendation follows 2010 ACCF/AHA/AATS/ACR/ASA/SCA/SCAI/SIR/STS/SVM Guidelines for the Diagnosis and Management of Patients With Thoracic Aortic Disease. Circulation. 2010; 121: A355-D322 3. Emphysema 4. Hiatal hernia with distal esophageal thickening, possible reflux or mild esophagitis. Critical Value/emergent results were called by telephone  at the time of interpretation on 04/05/2018 at 2:38 am to Dr. Theotis Burrow , who verbally  acknowledged these results. Aortic Atherosclerosis (ICD10-I70.0) and Emphysema (ICD10-J43.9).Aortic aneurysm NOS (ICD10-I71.9). Electronically Signed   By: Donavan Foil M.D.   On: 04/05/2018 02:38     LOS: 0 days   Oren Binet, MD  [facility]  If 7PM-7AM, please contact night-coverage  Please page via [email or web address]-Password [email or web address]-click on MD name and type text message  04/05/2018, 4:33 PM

## 2018-04-05 NOTE — Progress Notes (Signed)
ANTICOAGULATION CONSULT NOTE - Initial Consult  Pharmacy Consult for Xarelto Indication: pulmonary embolus  No Known Allergies  Patient Measurements:     Vital Signs: Temp: 98.1 F (36.7 C) (08/21 0500) Temp Source: Oral (08/21 0500) BP: 120/60 (08/21 0500) Pulse Rate: 82 (08/21 0500)  Labs: Recent Labs    04/04/18 2027  HGB 13.9  HCT 44.0  PLT 193  CREATININE 1.07    CrCl cannot be calculated (Unknown ideal weight.).   Medical History: Past Medical History:  Diagnosis Date  . 1st degree AV block 09/14/2016  . Arthritis   . Balanitis   . Dizziness 09/14/2016  . Prostate cancer ([place]) 2003   seed implants  . Shortness of breath 09/16/2016  . Sinus bradycardia 09/16/2016    Medications:  Scheduled:  . fentaNYL (SUBLIMAZE) injection  50 mcg Intravenous Once  . iopamidol        Assessment: 84 yo M presented with SOB and CP x 2 days.  CT found PE.  Pt initially started on treatment dose Lovenox (last dose 0530 8/21) now to transition to PO Xarelto.  SCr 1, CrCl >30, Hgb 13  Goal of Therapy:  therapeitic anticoagulation Monitor platelets by anticoagulation protocol: Yes   Plan:  D/C Lovenox. Start Xarelto 15mg  PO BID x 21 days, then Xarelto 20mg  daily with food. First Xarelto dose to be given 8/21 at [place], Pharm.D., BCPS Clinical Pharmacist Pager: [phone] Clinical phone for 04/05/2018 from 8:30-4:00 is [phone].  **Pharmacist phone directory can now be found on [email or web address] (PW [email or web address]).  Listed under [place].  04/05/2018 11:26 AM

## 2018-04-05 NOTE — Progress Notes (Signed)
ANTICOAGULATION CONSULT NOTE - Initial Consult  Pharmacy Consult for lovenox Indication: pulmonary embolus  No Known Allergies  Patient Measurements:   Heparin Dosing Weight: 170 lb  Vital Signs: Temp: 98.2 F (36.8 C) (08/20 2020) Temp Source: Oral (08/20 2020) BP: 115/75 (08/21 0315) Pulse Rate: 86 (08/21 0315)  Labs: Recent Labs    04/04/18 2027  HGB 13.9  HCT 44.0  PLT 193  CREATININE 1.07    CrCl cannot be calculated (Unknown ideal weight.).   Medical History: Past Medical History:  Diagnosis Date  . 1st degree AV block 09/14/2016  . Arthritis   . Balanitis   . Dizziness 09/14/2016  . Prostate cancer ([place]) 2003   seed implants  . Shortness of breath 09/16/2016  . Sinus bradycardia 09/16/2016    Medications:  See medication history  Assessment: 84 yo man to start lovenox for PE.  He was not on anticoagulation PTA Goal of Therapy:  Anti-Xa level 0.6-1 units/ml 4hrs after LMWH dose given Monitor platelets by anticoagulation protocol: Yes   Plan:  Lovenox 80 mg sq q12 hours Check CBC q 3 days while on lovenox  Gwyn Hieronymus Poteet 04/05/2018,3:41 AM

## 2018-04-05 NOTE — Significant Event (Addendum)
Rapid Response Event Note  Overview: Ongoing Chest Pain  Initial Focused Assessment:  Asked by RN if NTG should be given for chest pain or how to treat it. I went in to assess patient, patient was alert and oriented, conversant and laughing, stated the pain is in the left side of his chest, same intensity, he states that this is not new. Patient + small acute emboli w/i segmental LUL pulmonary branch vessels. Placed on DOAC.   VSS. Saturations are 100% on RA, skin warm and dry.  Interventions: - RN to give Tramadol for pain relief, will hold on EKG/NTG since pain seems more related to PE.   Plan of Care: - Re-assess pain in about 30-45 minutes - I called at 2140, per RN, patient is resting comfortably  Event Summary:   at    Start Time 2025 End Time 2040  Connor Morris R

## 2018-04-05 NOTE — Progress Notes (Signed)
Patient arrived to the unit via bed from the emergency department.  Patient is alert and oriented.  Skin assessment complete.  Skin is intact. No skin issues.  Placed the patient on cardiac monitoring.  educated the patient on how to reach the staff on the unit.  Activated the bed alarm and placed the call light within reach.  Will continue to monitor the patient and notify MD as needed

## 2018-04-05 NOTE — ED Notes (Signed)
Patient transported to CT 

## 2018-04-05 NOTE — H&P (Addendum)
History and Physical    Connor Morris PRF:[id number] DOB: 13-Nov-1933 DOA: 04/04/2018  PCP: Velna Hatchet, MD  Patient coming from: Home  I have personally briefly reviewed patient's old medical records in [place]  Chief Complaint: Chest pain  HPI: Connor Morris is a 84 y.o. male with medical history significant of prostate CA s/p seed implant in 2003.  Patient presents to the ED with c/o chest pain.  Symptoms onset over the last 2 days.  Heaviness that was initially intermittent but now constant.  Associated SOB, no nausea, no vomiting, no diaphoresis.  Pain is non exertional and nothing seems to make it better or worse.   ED Course: Found to have small LUL acute PE.  No recent travel nor prolonged immobility.   Review of Systems: As per HPI otherwise 10 point review of systems negative.   Past Medical History:  Diagnosis Date  . 1st degree AV block 09/14/2016  . Arthritis   . Balanitis   . Dizziness 09/14/2016  . Prostate cancer ([place]) 2003   seed implants  . Shortness of breath 09/16/2016  . Sinus bradycardia 09/16/2016    Past Surgical History:  Procedure Laterality Date  . FOOT SURGERY       reports that he quit smoking about 32 years ago. His smoking use included cigarettes. He has a 20.00 pack-year smoking history. He has never used smokeless tobacco. He reports that he does not drink alcohol or use drugs.  No Known Allergies  Family History  Problem Relation Age of Onset  . Diabetes Sister   . Hypertension Mother        family history  . Colon cancer Neg Hx      Prior to Admission medications   Medication Sig Start Date End Date Taking? Authorizing Provider  clotrimazole (LOTRIMIN) 1 % cream Apply to affected area 2 times daily Patient not taking: Reported on 04/04/2018 12/01/17   Phebe Colla, [place]    Physical Exam: Vitals:   04/05/18 0230 04/05/18 0245 04/05/18 0300 04/05/18 0315  BP: 116/67 108/65 112/67 115/75  Pulse: 86 87 88 86  Resp:  20 18 16 19   Temp:      TempSrc:      SpO2: 94% 95% 97% 93%    Constitutional: NAD, calm, comfortable Eyes: PERRL, lids and conjunctivae normal ENMT: Mucous membranes are moist. Posterior pharynx clear of any exudate or lesions.Normal dentition.  Neck: normal, supple, no masses, no thyromegaly Respiratory: clear to auscultation bilaterally, no wheezing, no crackles. Normal respiratory effort. No accessory muscle use.  Cardiovascular: Regular rate and rhythm, no murmurs / rubs / gallops. No extremity edema. 2+ pedal pulses. No carotid bruits.  Abdomen: no tenderness, no masses palpated. No hepatosplenomegaly. Bowel sounds positive.  Musculoskeletal: no clubbing / cyanosis. No joint deformity upper and lower extremities. Good ROM, no contractures. Normal muscle tone.  Skin: no rashes, lesions, ulcers. No induration Neurologic: CN 2-12 grossly intact. Sensation intact, DTR normal. Strength 5/5 in all 4.  Psychiatric: Normal judgment and insight. Alert and oriented x 3. Normal mood.    Labs on Admission: I have personally reviewed following labs and imaging studies  CBC: Recent Labs  Lab 04/04/18 2027  WBC 8.1  HGB 13.9  HCT 44.0  MCV 92.6  PLT 935   Basic Metabolic Panel: Recent Labs  Lab 04/04/18 2027  NA 136  K 4.1  CL 104  CO2 24  GLUCOSE 101*  BUN 13  CREATININE 1.07  CALCIUM 9.8  GFR: CrCl cannot be calculated (Unknown ideal weight.). Liver Function Tests: No results for input(s): AST, ALT, ALKPHOS, BILITOT, PROT, ALBUMIN in the last 168 hours. No results for input(s): LIPASE, AMYLASE in the last 168 hours. No results for input(s): AMMONIA in the last 168 hours. Coagulation Profile: No results for input(s): INR, PROTIME in the last 168 hours. Cardiac Enzymes: No results for input(s): CKTOTAL, CKMB, CKMBINDEX, TROPONINI in the last 168 hours. BNP (last 3 results) No results for input(s): PROBNP in the last 8760 hours. HbA1C: No results for input(s): HGBA1C  in the last 72 hours. CBG: No results for input(s): GLUCAP in the last 168 hours. Lipid Profile: No results for input(s): CHOL, HDL, LDLCALC, TRIG, CHOLHDL, LDLDIRECT in the last 72 hours. Thyroid Function Tests: No results for input(s): TSH, T4TOTAL, FREET4, T3FREE, THYROIDAB in the last 72 hours. Anemia Panel: No results for input(s): VITAMINB12, FOLATE, FERRITIN, TIBC, IRON, RETICCTPCT in the last 72 hours. Urine analysis: No results found for: COLORURINE, APPEARANCEUR, LABSPEC, PHURINE, GLUCOSEU, HGBUR, BILIRUBINUR, KETONESUR, PROTEINUR, UROBILINOGEN, NITRITE, LEUKOCYTESUR  Radiological Exams on Admission: Dg Chest 2 View  Result Date: 04/04/2018 CLINICAL DATA:  Left-sided chest pain and dyspnea onset yesterday. EXAM: CHEST - 2 VIEW COMPARISON:  CT abdomen and pelvis 06/07/2016, CXR 08/18/2006 FINDINGS: Borderline cardiomegaly with tortuous atherosclerotic and ectatic appearing thoracic aorta. Small hiatal hernia is present causing a double density over the cardiac silhouette. Bibasilar atelectasis is seen. On the lateral view, there is redemonstration of 50% compression deformity of T12 and superior endplate central depression of L1. IMPRESSION: 1. Borderline cardiomegaly with aortic atherosclerosis. Bibasilar atelectasis without acute pulmonary disease. 2. Chronic moderate T12 compression deformity with central superior endplate compression of L1. Electronically Signed   By: Ashley Royalty M.D.   On: 04/04/2018 21:11   Ct Angio Chest Pe W/cm &/or Wo Cm  Result Date: 04/05/2018 CLINICAL DATA:  Left-sided chest pain EXAM: CT ANGIOGRAPHY CHEST WITH CONTRAST TECHNIQUE: Multidetector CT imaging of the chest was performed using the standard protocol during bolus administration of intravenous contrast. Multiplanar CT image reconstructions and MIPs were obtained to evaluate the vascular anatomy. CONTRAST:  46mL ISOVUE-370 IOPAMIDOL (ISOVUE-370) INJECTION 76% COMPARISON:  Chest x-ray 04/04/2018  FINDINGS: Cardiovascular: Satisfactory opacification of the pulmonary arteries to the segmental level. Small nonocclusive emboli with left upper lobe segmental branches. Aneurysmal dilatation of the ascending aorta up to 4.6 cm. Moderate aortic atherosclerosis. Coronary vascular calcification. Mild cardiomegaly. No pericardial effusion Mediastinum/Nodes: Midline trachea. No thyroid mass. No significantly enlarged lymph nodes. Hiatal hernia with mild distal esophageal thickening. Lungs/Pleura: No acute consolidation or effusion. Moderate emphysema. Bronchial wall thickening in the left greater than right lower lobe with atelectasis or mild infiltrate at the left base. No pleural effusion Upper Abdomen: Cyst within the upper pole of the left kidney. No acute abnormality Musculoskeletal: Moderate compression deformity L1. Review of the MIP images confirms the above findings. IMPRESSION: 1. Positive for small acute emboli within segmental left upper lobe pulmonary branch vessels. 2. Aneurysmal dilatation of the ascending aorta up to 4.6 cm. Ascending thoracic aortic aneurysm. Recommend semi-annual imaging followup by CTA or MRA and referral to cardiothoracic surgery if not already obtained. This recommendation follows 2010 ACCF/AHA/AATS/ACR/ASA/SCA/SCAI/SIR/STS/SVM Guidelines for the Diagnosis and Management of Patients With Thoracic Aortic Disease. Circulation. 2010; 121: J188-C166 3. Emphysema 4. Hiatal hernia with distal esophageal thickening, possible reflux or mild esophagitis. Critical Value/emergent results were called by telephone at the time of interpretation on 04/05/2018 at 2:38 am to Dr. Theotis Burrow ,  who verbally acknowledged these results. Aortic Atherosclerosis (ICD10-I70.0) and Emphysema (ICD10-J43.9).Aortic aneurysm NOS (ICD10-I71.9). Electronically Signed   By: Donavan Foil M.D.   On: 04/05/2018 02:38    EKG: Independently reviewed.  Assessment/Plan Principal Problem:   Acute pulmonary  embolism without acute cor pulmonale (HCC) Active Problems:   History of prostate cancer   Thoracic ascending aortic aneurysm ([place])    1. Acute PE - 1. Lovenox 2. Tele monitor 3. 2d echo 4. BLE [place] 2. H/o Prostate CA - 1. Checking PSA 2. No osseous mets on bone scan last year 3. May want to touch base with oncology in AM to see if we need to repeat bone scan or other imaging study to look for evidence of mets now that he has an apparently unprovoked PE. 3. Thoracic ascending aortic aneurysm - 1. Needs semi-annual imaging follow up with CT or MRI to monitor.  DVT prophylaxis: Lovenox Code Status: Full Family Communication: No family in room Disposition Plan: Home after admit Consults called: None Admission status: Place in obs   Kamrynn Melott, [facility] Pager [phone] Only works nights!  If 7AM-7PM, please contact the primary day team physician taking care of patient  [email or web address] Password [facility]  04/05/2018, 3:56 AM

## 2018-04-05 NOTE — ED Provider Notes (Addendum)
[facility] EMERGENCY DEPARTMENT Provider Note   CSN: [id number] Arrival date & time: 04/04/18  2013     History   Chief Complaint Chief Complaint  Patient presents with  . Chest Pain    HPI Connor Morris is a 84 y.o. male.  84 year old male with past medical history including first-degree heart block, prostate cancer who presents with chest pain.  Over the past 2 days, the patient has had left-sided chest pain that he describes as a heaviness that was initially intermittent but has been more constant since this afternoon/evening.  He reports associated shortness of breath, no nausea, vomiting, or diaphoresis.  The pain is nonexertional and nothing seems to make it better or worse.  He rubs some alcohol in his chest earlier to try to help his pain.  He has never had pain like this before.  He denies any leg swelling or pain.  No recent travel or history of blood clots.  He denies any cardiac history.  No family history of heart disease.  The history is provided by the patient.  Chest Pain      Past Medical History:  Diagnosis Date  . 1st degree AV block 09/14/2016  . Arthritis   . Balanitis   . Dizziness 09/14/2016  . Prostate cancer ([place]) 2003   seed implants  . Shortness of breath 09/16/2016  . Sinus bradycardia 09/16/2016    Patient Active Problem List   Diagnosis Date Noted  . Sinus bradycardia 09/16/2016  . Shortness of breath 09/16/2016  . Bradycardia 09/14/2016  . 1st degree AV block 09/14/2016  . Dizziness 09/14/2016    Past Surgical History:  Procedure Laterality Date  . FOOT SURGERY          Home Medications    Prior to Admission medications   Medication Sig Start Date End Date Taking? Authorizing Provider  clotrimazole (LOTRIMIN) 1 % cream Apply to affected area 2 times daily Patient not taking: Reported on 04/04/2018 12/01/17   Phebe Colla, PA-C    Family History Family History  Problem Relation Age of Onset  . Diabetes  Sister   . Hypertension Mother        family history  . Colon cancer Neg Hx     Social History Social History   Tobacco Use  . Smoking status: Former Smoker    Packs/day: 1.00    Years: 20.00    Pack years: 20.00    Types: Cigarettes    Last attempt to quit: 09/14/1985    Years since quitting: 32.5  . Smokeless tobacco: Never Used  Substance Use Topics  . Alcohol use: No  . Drug use: No     Allergies   Patient has no known allergies.   Review of Systems Review of Systems  Cardiovascular: Positive for chest pain.   All other systems reviewed and are negative except that which was mentioned in HPI   Physical Exam Updated Vital Signs BP 115/75   Pulse 86   Temp 98.2 F (36.8 C) (Oral)   Resp 19   SpO2 93%   Physical Exam  Constitutional: He is oriented to person, place, and time. He appears well-developed and well-nourished.  In mild distress due to pain  HENT:  Head: Normocephalic and atraumatic.  Moist mucous membranes  Eyes: Pupils are equal, round, and reactive to light. Conjunctivae are normal.  Neck: Neck supple.  Cardiovascular: Regular rhythm and normal heart sounds. Bradycardia present.  No murmur heard. Pulmonary/Chest: Effort  normal and breath sounds normal.  Abdominal: Soft. Bowel sounds are normal. He exhibits no distension. There is no tenderness.  Musculoskeletal: He exhibits no edema.  Neurological: He is alert and oriented to person, place, and time.  Fluent speech  Skin: Skin is warm and dry.  Psychiatric: He has a normal mood and affect. Judgment normal.  Nursing note and vitals reviewed.    ED Treatments / Results  Labs (all labs ordered are listed, but only abnormal results are displayed) Labs Reviewed  BASIC METABOLIC PANEL - Abnormal; Notable for the following components:      Result Value   Glucose, Bld 101 (*)    All other components within normal limits  D-DIMER, QUANTITATIVE (NOT AT [facility]) - Abnormal; Notable for the  following components:   D-Dimer, Quant 5.25 (*)    All other components within normal limits  CBC  I-STAT TROPONIN, ED  I-STAT TROPONIN, ED    EKG EKG Interpretation  Date/Time:  Tuesday April 04 2018 20:17:29 EDT Ventricular Rate:  74 PR Interval:  194 QRS Duration: 104 QT Interval:  394 QTC Calculation: 437 R Axis:   -59 Text Interpretation:  Normal sinus rhythm Incomplete right bundle branch block Left anterior fascicular block Moderate voltage criteria for LVH, may be normal variant Abnormal ECG previous T wave inversions in III have normalized T wave flattening in I and aVL Confirmed by Theotis Burrow [id number]) on 04/04/2018 11:18:58 PM   Radiology Dg Chest 2 View  Result Date: 04/04/2018 CLINICAL DATA:  Left-sided chest pain and dyspnea onset yesterday. EXAM: CHEST - 2 VIEW COMPARISON:  CT abdomen and pelvis 06/07/2016, CXR 08/18/2006 FINDINGS: Borderline cardiomegaly with tortuous atherosclerotic and ectatic appearing thoracic aorta. Small hiatal hernia is present causing a double density over the cardiac silhouette. Bibasilar atelectasis is seen. On the lateral view, there is redemonstration of 50% compression deformity of T12 and superior endplate central depression of L1. IMPRESSION: 1. Borderline cardiomegaly with aortic atherosclerosis. Bibasilar atelectasis without acute pulmonary disease. 2. Chronic moderate T12 compression deformity with central superior endplate compression of L1. Electronically Signed   By: Ashley Royalty M.D.   On: 04/04/2018 21:11   Ct Angio Chest Pe W/cm &/or Wo Cm  Result Date: 04/05/2018 CLINICAL DATA:  Left-sided chest pain EXAM: CT ANGIOGRAPHY CHEST WITH CONTRAST TECHNIQUE: Multidetector CT imaging of the chest was performed using the standard protocol during bolus administration of intravenous contrast. Multiplanar CT image reconstructions and MIPs were obtained to evaluate the vascular anatomy. CONTRAST:  57mL ISOVUE-370 IOPAMIDOL (ISOVUE-370)  INJECTION 76% COMPARISON:  Chest x-ray 04/04/2018 FINDINGS: Cardiovascular: Satisfactory opacification of the pulmonary arteries to the segmental level. Small nonocclusive emboli with left upper lobe segmental branches. Aneurysmal dilatation of the ascending aorta up to 4.6 cm. Moderate aortic atherosclerosis. Coronary vascular calcification. Mild cardiomegaly. No pericardial effusion Mediastinum/Nodes: Midline trachea. No thyroid mass. No significantly enlarged lymph nodes. Hiatal hernia with mild distal esophageal thickening. Lungs/Pleura: No acute consolidation or effusion. Moderate emphysema. Bronchial wall thickening in the left greater than right lower lobe with atelectasis or mild infiltrate at the left base. No pleural effusion Upper Abdomen: Cyst within the upper pole of the left kidney. No acute abnormality Musculoskeletal: Moderate compression deformity L1. Review of the MIP images confirms the above findings. IMPRESSION: 1. Positive for small acute emboli within segmental left upper lobe pulmonary branch vessels. 2. Aneurysmal dilatation of the ascending aorta up to 4.6 cm. Ascending thoracic aortic aneurysm. Recommend semi-annual imaging followup by CTA or MRA and  referral to cardiothoracic surgery if not already obtained. This recommendation follows 2010 ACCF/AHA/AATS/ACR/ASA/SCA/SCAI/SIR/STS/SVM Guidelines for the Diagnosis and Management of Patients With Thoracic Aortic Disease. Circulation. 2010; 121: H675-F163 3. Emphysema 4. Hiatal hernia with distal esophageal thickening, possible reflux or mild esophagitis. Critical Value/emergent results were called by telephone at the time of interpretation on 04/05/2018 at 2:38 am to Dr. Theotis Burrow , who verbally acknowledged these results. Aortic Atherosclerosis (ICD10-I70.0) and Emphysema (ICD10-J43.9).Aortic aneurysm NOS (ICD10-I71.9). Electronically Signed   By: Donavan Foil M.D.   On: 04/05/2018 02:38    Procedures .Critical Care Performed by:  Sharlett Iles, MD Authorized by: Sharlett Iles, MD   Critical care provider statement:    Critical care time (minutes):  30   Critical care time was exclusive of:  Separately billable procedures and treating other patients   Critical care was necessary to treat or prevent imminent or life-threatening deterioration of the following conditions: Pulmonary embolism.   Critical care was time spent personally by me on the following activities:  Development of treatment plan with patient or surrogate, evaluation of patient's response to treatment, examination of patient, obtaining history from patient or surrogate, ordering and performing treatments and interventions, ordering and review of laboratory studies, ordering and review of radiographic studies, re-evaluation of patient's condition and review of old charts   (including critical care time)  Medications Ordered in ED Medications  nitroGLYCERIN (NITROSTAT) SL tablet 0.4 mg (0.4 mg Sublingual Given 04/05/18 0030)  fentaNYL (SUBLIMAZE) injection 50 mcg (has no administration in time range)  iopamidol (ISOVUE-370) 76 % injection (has no administration in time range)  aspirin chewable tablet 324 mg (324 mg Oral Given 04/05/18 0019)  iopamidol (ISOVUE-370) 76 % injection 100 mL (66 mLs Intravenous Contrast Given 04/05/18 0205)     Initial Impression / Assessment and Plan / ED Course  I have reviewed the triage vital signs and the nursing notes.  Pertinent labs & imaging results that were available during my care of the patient were reviewed by me and considered in my medical decision making (see chart for details).     Pt was uncomfortable and in pain on exam. Hypertensive, remainder of VS stable.  EKG shows some T wave flattening in lateral leads compared to previous however no ST elevation.  Gave aspirin and nitroglycerin.  Initial troponin negative and chest x-ray negative acute.  Obtained d-dimer because of history of prostate  cancer.  D-dimer elevated at 5.25.  Obtain CTA of chest which was positive for small pulmonary emboli in left upper lobe.  Gave the patient Lovenox and discussed admission with [facility], Dr. Alcario Drought.  Patient admitted for further care.   Patient was also notified of incidental finding of aortic aneurysm and need for outpatient follow up.   Final Clinical Impressions(s) / ED Diagnoses   Final diagnoses:  Acute pulmonary embolism without acute cor pulmonale, unspecified pulmonary embolism type [facility])    ED Discharge Orders    None       Parul Porcelli, Wenda Overland, MD 04/05/18 Dobbins, Wenda Overland, MD 04/05/18 [phone]

## 2018-04-06 ENCOUNTER — Observation Stay (HOSPITAL_COMMUNITY): Payer: Medicare HMO

## 2018-04-06 DIAGNOSIS — I712 Thoracic aortic aneurysm, without rupture: Secondary | ICD-10-CM | POA: Diagnosis not present

## 2018-04-06 DIAGNOSIS — I2692 Saddle embolus of pulmonary artery without acute cor pulmonale: Secondary | ICD-10-CM | POA: Diagnosis not present

## 2018-04-06 DIAGNOSIS — I2699 Other pulmonary embolism without acute cor pulmonale: Secondary | ICD-10-CM | POA: Diagnosis not present

## 2018-04-06 DIAGNOSIS — Z8546 Personal history of malignant neoplasm of prostate: Secondary | ICD-10-CM | POA: Diagnosis not present

## 2018-04-06 LAB — ECHOCARDIOGRAM COMPLETE

## 2018-04-06 MED ORDER — PANTOPRAZOLE SODIUM 40 MG PO TBEC
40.0000 mg | DELAYED_RELEASE_TABLET | Freq: Every day | ORAL | Status: DC
  Administered 2018-04-06: 40 mg via ORAL
  Filled 2018-04-06: qty 1

## 2018-04-06 MED ORDER — TRAMADOL HCL 50 MG PO TABS: 50.0000 mg | ORAL_TABLET | Freq: Four times a day (QID) | ORAL | 0 refills | Status: DC | PRN

## 2018-04-06 MED ORDER — RIVAROXABAN (XARELTO) VTE STARTER PACK (15 & 20 MG): ORAL_TABLET | ORAL | 0 refills | Status: DC

## 2018-04-06 MED ORDER — PANTOPRAZOLE SODIUM 40 MG PO TBEC: 40.0000 mg | DELAYED_RELEASE_TABLET | Freq: Every day | ORAL | 0 refills | Status: DC

## 2018-04-06 NOTE — Progress Notes (Signed)
Nsg Discharge Note  Admit Date:  04/04/2018 Discharge date: 04/06/2018   [name] to be D/C'd Home per MD order.  AVS completed.  Copy for chart, and copy for patient signed, and dated. Patient/caregiver able to verbalize understanding.  Discharge Medication: Allergies as of 04/06/2018   No Known Allergies     Medication List    STOP taking these medications   clotrimazole 1 % cream Commonly known as:  LOTRIMIN     TAKE these medications   pantoprazole 40 MG tablet Commonly known as:  PROTONIX Take 1 tablet (40 mg total) by mouth daily at 12 noon.   Rivaroxaban 15 & 20 MG Tbpk Take as directed on package: Start with one 15mg  tablet by mouth twice a day with food until 9/12 morning.. On 9/12 evening, switch to one 20mg  tablet once a day with food.   traMADol 50 MG tablet Commonly known as:  ULTRAM Take 1 tablet (50 mg total) by mouth every 6 (six) hours as needed for moderate pain.       Discharge Assessment: Vitals:   04/06/18 0512 04/06/18 1355  BP: (!) 114/59 120/65  Pulse: (!) 58 60  Resp: 16 17  Temp: 98.1 F (36.7 C) 98 F (36.7 C)  SpO2: 96% 97%   Skin clean, dry and intact without evidence of skin break down, no evidence of skin tears noted. IV catheter discontinued intact. Site without signs and symptoms of complications - no redness or edema noted at insertion site, patient denies c/o pain - only slight tenderness at site.  Dressing with slight pressure applied.  D/c Instructions-Education: Discharge instructions given to patient/family with verbalized understanding. D/c education completed with patient/family including follow up instructions, medication list, d/c activities limitations if indicated, with other d/c instructions as indicated by MD - patient able to verbalize understanding, all questions fully answered. Patient instructed to return to ED, call 911, or call MD for any changes in condition.  Patient escorted via [place], and D/C home via private  auto.  [place] N Brandon Scarbrough, RN 04/06/2018 2:54 PM

## 2018-04-06 NOTE — Care Management Note (Signed)
Case Management Note  Patient Details  Name: Connor Morris MRN: [id number] Date of Birth: 1934/01/23  Subjective/Objective:  Acute pulmonary embolism.       Hx of prostate CA status post seed implant/brachii therapy in 2003. From home alone. States lives close by and will assist with needs @ d/c. PTA independent with ADL's , no DME usage.      Ival Bible (Daughter) Raevon Broom ([phone]      PCP:  Velna Hatchet  Action/Plan: Transition to home today. NCM provided pt with 30 day free Xarelto card. Pt has transportation to home,son.  Expected Discharge Date:   04/06/2018          Expected Discharge Plan:  Home/Self Care  In-House Referral:     Discharge planning Services  CM Consult(New Xarelto for PE)  Post Acute Care Choice:    Choice offered to:     DME Arranged:   N/A DME Agency:    N/A HH Arranged:   N/A HH Agency:   N/A  Status of Service:  Completed, signed off  If discussed at [place] of Stay Meetings, dates discussed:    Additional Comments:  Sharin Mons, RN 04/06/2018, 10:25 AM

## 2018-04-06 NOTE — Discharge Summary (Signed)
PATIENT DETAILS Name: Connor Morris Age: 84 y.o. Sex: male Date of Birth: 1934/06/19 MRN: [id number]. Admitting Physician: Etta Quill, DO WGY:[id number], Nicki Reaper, MD  Admit Date: 04/04/2018 Discharge date: 04/06/2018  Recommendations for Outpatient Follow-up:  1. Follow up with PCP in 1-2 weeks 2. Please obtain BMP/CBC in one week 3. Please refer patient to Hematology-before discontinuing anticoagulation 4. Please ensure follow-up with oncology/urology as PSA elevated 5. Has 4.6 cm aneurysmal dilatation of ascending aorta-we will require semiannual imaging and or referral to cardiothoracic surgery.  Admitted From:  Home  Disposition: [place]: No  Equipment/Devices: None  Discharge Condition: Stable  CODE STATUS: FULL CODE  Diet recommendation:  Heart Healthy   Brief Summary: See H&P, Labs, Consult and Test reports for all details in brief, Patient is a 84 y.o. male with prior history of prostate CA status post seed implant/brachii therapy in 2003-presenting with left-sided chest pain, found to have pulmonary embolism.  See below for further details.  Brief Hospital Course: Pulmonary embolism: Likely the cause of patient's left-sided chest pain-it appears to be a known provoked pulmonary embolism.  Lower extremity Doppler positive for left popliteal vein DVT.    2D echocardiogram showed no signs of RV strain, EF is preserved without any wall motion abnormalities.  Initially on Lovenox, switched over to Xarelto on 8/21.  Seems to be tolerating anticoagulation pretty well, continues to have intermittent left-sided pleuritic chest pain that is easily controlled with Tylenol and Ultram.  Stable for discharge today-hemodynamically stable, will need evaluation by hematology before consideration of discontinuing anticoagulation.  Per nursing staff, patient ambulated 200 feet in the hallway without any issues.  Asymptomatic sinus bradycardia: Followed by cardiology  in the outpatient setting.  Avoid rate control agents.  Remote history of prostate cancer: Stable for outpatient follow-up with his urologist/oncologist.  PSA 11.93.  Aneurysmal dilatation of the ascending aorta up to 4.6 cm: Radiology recommending semiannual imaging by CTA or MRA.  Consider outpatient follow-up with cardiothoracic surgery as well.  Note-tried calling patient's daughter unable to leave a message, left a message/voicemail for patient's brother earlier this morning.  Procedures/Studies: None  Discharge Diagnoses:  Principal Problem:   Acute pulmonary embolism without acute cor pulmonale (HCC) Active Problems:   History of prostate cancer   Thoracic ascending aortic aneurysm [facility])   Discharge Instructions:  Activity:  As tolerated  Discharge Instructions    Call MD for:  difficulty breathing, headache or visual disturbances   Complete by:  As directed    Call MD for:  persistant nausea and vomiting   Complete by:  As directed    Call MD for:  severe uncontrolled pain   Complete by:  As directed    Diet general   Complete by:  As directed    Discharge instructions   Complete by:  As directed    Follow with Primary MD  Velna Hatchet, MD in 1 week  You have blood clots in your lungs and in your leg,hence you have been started on a blood thinner (Xarelto). If you have black tarry stools, blood stools, bloody vomiting, head trauma, significant headache or confusion-please seek immediate medical attention.   Please get a complete blood count and chemistry panel checked by your Primary MD at your next visit, and again as instructed by your Primary MD.  Get Medicines reviewed and adjusted: Please take all your medications with you for your next visit with your Primary MD  Laboratory/radiological data: Please request  your Primary MD to go over all hospital tests and procedure/radiological results at the follow up, please ask your Primary MD to get all Hospital  records sent to his/her office.  In some cases, they will be blood work, cultures and biopsy results pending at the time of your discharge. Please request that your primary care M.D. follows up on these results.  Also Note the following: If you experience worsening of your admission symptoms, develop shortness of breath, life threatening emergency, suicidal or homicidal thoughts you must seek medical attention immediately by calling 911 or calling your MD immediately  if symptoms less severe.  You must read complete instructions/literature along with all the possible adverse reactions/side effects for all the Medicines you take and that have been prescribed to you. Take any new Medicines after you have completely understood and accpet all the possible adverse reactions/side effects.   Do not drive when taking Pain medications or sleeping medications (Benzodaizepines)  Do not take more than prescribed Pain, Sleep and Anxiety Medications. It is not advisable to combine anxiety,sleep and pain medications without talking with your primary care practitioner  Special Instructions: If you have smoked or chewed Tobacco  in the last 2 yrs please stop smoking, stop any regular Alcohol  and or any Recreational drug use.  Wear Seat belts while driving.  Please note: You were cared for by a hospitalist during your hospital stay. Once you are discharged, your primary care physician will handle any further medical issues. Please note that NO REFILLS for any discharge medications will be authorized once you are discharged, as it is imperative that you return to your primary care physician (or establish a relationship with a primary care physician if you do not have one) for your post hospital discharge needs so that they can reassess your need for medications and monitor your lab values.   Increase activity slowly   Complete by:  As directed      Allergies as of 04/06/2018   No Known Allergies       Medication List    STOP taking these medications   clotrimazole 1 % cream Commonly known as:  LOTRIMIN     TAKE these medications   pantoprazole 40 MG tablet Commonly known as:  PROTONIX Take 1 tablet (40 mg total) by mouth daily at 12 noon.   Rivaroxaban 15 & 20 MG Tbpk Take as directed on package: Start with one 15mg  tablet by mouth twice a day with food until 9/12 morning.. On 9/12 evening, switch to one 20mg  tablet once a day with food.   traMADol 50 MG tablet Commonly known as:  ULTRAM Take 1 tablet (50 mg total) by mouth every 6 (six) hours as needed for moderate pain.      Follow-up Information    Velna Hatchet, MD. Schedule an appointment as soon as possible for a visit in 1 week(s).   Specialty:  Internal Medicine Contact information: [place] [address] [phone]        Nahser, Wonda Cheng, MD. Schedule an appointment as soon as possible for a visit in 2 week(s).   Specialty:  Cardiology Contact information: [address] [place] [address] [phone]          No Known Allergies   Consultations:   None  Other Procedures/Studies: Dg Chest 2 View  Result Date: 04/04/2018 CLINICAL DATA:  Left-sided chest pain and dyspnea onset yesterday. EXAM: CHEST - 2 VIEW COMPARISON:  CT abdomen  and pelvis 06/07/2016, CXR 08/18/2006 FINDINGS: Borderline cardiomegaly with tortuous atherosclerotic and ectatic appearing thoracic aorta. Small hiatal hernia is present causing a double density over the cardiac silhouette. Bibasilar atelectasis is seen. On the lateral view, there is redemonstration of 50% compression deformity of T12 and superior endplate central depression of L1. IMPRESSION: 1. Borderline cardiomegaly with aortic atherosclerosis. Bibasilar atelectasis without acute pulmonary disease. 2. Chronic moderate T12 compression deformity with central superior endplate compression of L1. Electronically Signed   By: Ashley Royalty  M.D.   On: 04/04/2018 21:11   Ct Angio Chest Pe W/cm &/or Wo Cm  Result Date: 04/05/2018 CLINICAL DATA:  Left-sided chest pain EXAM: CT ANGIOGRAPHY CHEST WITH CONTRAST TECHNIQUE: Multidetector CT imaging of the chest was performed using the standard protocol during bolus administration of intravenous contrast. Multiplanar CT image reconstructions and MIPs were obtained to evaluate the vascular anatomy. CONTRAST:  62mL ISOVUE-370 IOPAMIDOL (ISOVUE-370) INJECTION 76% COMPARISON:  Chest x-ray 04/04/2018 FINDINGS: Cardiovascular: Satisfactory opacification of the pulmonary arteries to the segmental level. Small nonocclusive emboli with left upper lobe segmental branches. Aneurysmal dilatation of the ascending aorta up to 4.6 cm. Moderate aortic atherosclerosis. Coronary vascular calcification. Mild cardiomegaly. No pericardial effusion Mediastinum/Nodes: Midline trachea. No thyroid mass. No significantly enlarged lymph nodes. Hiatal hernia with mild distal esophageal thickening. Lungs/Pleura: No acute consolidation or effusion. Moderate emphysema. Bronchial wall thickening in the left greater than right lower lobe with atelectasis or mild infiltrate at the left base. No pleural effusion Upper Abdomen: Cyst within the upper pole of the left kidney. No acute abnormality Musculoskeletal: Moderate compression deformity L1. Review of the MIP images confirms the above findings. IMPRESSION: 1. Positive for small acute emboli within segmental left upper lobe pulmonary branch vessels. 2. Aneurysmal dilatation of the ascending aorta up to 4.6 cm. Ascending thoracic aortic aneurysm. Recommend semi-annual imaging followup by CTA or MRA and referral to cardiothoracic surgery if not already obtained. This recommendation follows 2010 ACCF/AHA/AATS/ACR/ASA/SCA/SCAI/SIR/STS/SVM Guidelines for the Diagnosis and Management of Patients With Thoracic Aortic Disease. Circulation. 2010; 121: Z366-Y403 3. Emphysema 4. Hiatal hernia with  distal esophageal thickening, possible reflux or mild esophagitis. Critical Value/emergent results were called by telephone at the time of interpretation on 04/05/2018 at 2:38 am to Dr. Theotis Burrow , who verbally acknowledged these results. Aortic Atherosclerosis (ICD10-I70.0) and Emphysema (ICD10-J43.9).Aortic aneurysm NOS (ICD10-I71.9). Electronically Signed   By: Donavan Foil M.D.   On: 04/05/2018 02:38     TODAY-DAY OF DISCHARGE:  Subjective:   [name] today has no headache,no chest abdominal pain,no new weakness tingling or numbness, feels much better wants to go home today.  Objective:   Blood pressure (!) 114/59, pulse (!) 58, temperature 98.1 F (36.7 C), resp. rate 16, SpO2 96 %.  Intake/Output Summary (Last 24 hours) at 04/06/2018 1317 Last data filed at 04/06/2018 1140 Gross per 24 hour  Intake 222 ml  Output 1400 ml  Net -1178 ml   There were no vitals filed for this visit.  Exam: Awake Alert, Oriented *3, No new F.N deficits, Normal affect [place].AT,PERRAL Supple Neck,No JVD, No cervical lymphadenopathy appriciated.  Symmetrical Chest wall movement, Good air movement bilaterally, CTAB RRR,No Gallops,Rubs or new Murmurs, No Parasternal Heave +ve B.Sounds, Abd Soft, Non tender, No organomegaly appriciated, No rebound -guarding or rigidity. No Cyanosis, Clubbing or edema, No new Rash or bruise   PERTINENT RADIOLOGIC STUDIES: Dg Chest 2 View  Result Date: 04/04/2018 CLINICAL DATA:  Left-sided chest pain and dyspnea onset yesterday. EXAM: CHEST -  2 VIEW COMPARISON:  CT abdomen and pelvis 06/07/2016, CXR 08/18/2006 FINDINGS: Borderline cardiomegaly with tortuous atherosclerotic and ectatic appearing thoracic aorta. Small hiatal hernia is present causing a double density over the cardiac silhouette. Bibasilar atelectasis is seen. On the lateral view, there is redemonstration of 50% compression deformity of T12 and superior endplate central depression of L1. IMPRESSION:  1. Borderline cardiomegaly with aortic atherosclerosis. Bibasilar atelectasis without acute pulmonary disease. 2. Chronic moderate T12 compression deformity with central superior endplate compression of L1. Electronically Signed   By: Ashley Royalty M.D.   On: 04/04/2018 21:11   Ct Angio Chest Pe W/cm &/or Wo Cm  Result Date: 04/05/2018 CLINICAL DATA:  Left-sided chest pain EXAM: CT ANGIOGRAPHY CHEST WITH CONTRAST TECHNIQUE: Multidetector CT imaging of the chest was performed using the standard protocol during bolus administration of intravenous contrast. Multiplanar CT image reconstructions and MIPs were obtained to evaluate the vascular anatomy. CONTRAST:  28mL ISOVUE-370 IOPAMIDOL (ISOVUE-370) INJECTION 76% COMPARISON:  Chest x-ray 04/04/2018 FINDINGS: Cardiovascular: Satisfactory opacification of the pulmonary arteries to the segmental level. Small nonocclusive emboli with left upper lobe segmental branches. Aneurysmal dilatation of the ascending aorta up to 4.6 cm. Moderate aortic atherosclerosis. Coronary vascular calcification. Mild cardiomegaly. No pericardial effusion Mediastinum/Nodes: Midline trachea. No thyroid mass. No significantly enlarged lymph nodes. Hiatal hernia with mild distal esophageal thickening. Lungs/Pleura: No acute consolidation or effusion. Moderate emphysema. Bronchial wall thickening in the left greater than right lower lobe with atelectasis or mild infiltrate at the left base. No pleural effusion Upper Abdomen: Cyst within the upper pole of the left kidney. No acute abnormality Musculoskeletal: Moderate compression deformity L1. Review of the MIP images confirms the above findings. IMPRESSION: 1. Positive for small acute emboli within segmental left upper lobe pulmonary branch vessels. 2. Aneurysmal dilatation of the ascending aorta up to 4.6 cm. Ascending thoracic aortic aneurysm. Recommend semi-annual imaging followup by CTA or MRA and referral to cardiothoracic surgery if not  already obtained. This recommendation follows 2010 ACCF/AHA/AATS/ACR/ASA/SCA/SCAI/SIR/STS/SVM Guidelines for the Diagnosis and Management of Patients With Thoracic Aortic Disease. Circulation. 2010; 121: Z610-R604 3. Emphysema 4. Hiatal hernia with distal esophageal thickening, possible reflux or mild esophagitis. Critical Value/emergent results were called by telephone at the time of interpretation on 04/05/2018 at 2:38 am to Dr. Theotis Burrow , who verbally acknowledged these results. Aortic Atherosclerosis (ICD10-I70.0) and Emphysema (ICD10-J43.9).Aortic aneurysm NOS (ICD10-I71.9). Electronically Signed   By: Donavan Foil M.D.   On: 04/05/2018 02:38     PERTINENT LAB RESULTS: CBC: Recent Labs    04/04/18 2027  WBC 8.1  HGB 13.9  HCT 44.0  PLT 193   CMET CMP     Component Value Date/Time   NA 136 04/04/2018 2027   K 4.1 04/04/2018 2027   CL 104 04/04/2018 2027   CO2 24 04/04/2018 2027   GLUCOSE 101 (H) 04/04/2018 2027   BUN 13 04/04/2018 2027   CREATININE 1.07 04/04/2018 2027   CALCIUM 9.8 04/04/2018 2027   GFRNONAA >60 04/04/2018 2027   GFRAA >60 04/04/2018 2027    GFR CrCl cannot be calculated (Unknown ideal weight.). No results for input(s): LIPASE, AMYLASE in the last 72 hours. No results for input(s): CKTOTAL, CKMB, CKMBINDEX, TROPONINI in the last 72 hours. Invalid input(s): POCBNP Recent Labs    04/04/18 2027  DDIMER 5.25*   No results for input(s): HGBA1C in the last 72 hours. No results for input(s): CHOL, HDL, LDLCALC, TRIG, CHOLHDL, LDLDIRECT in the last 72 hours. No results for  input(s): TSH, T4TOTAL, T3FREE, THYROIDAB in the last 72 hours.  Invalid input(s): FREET3 No results for input(s): VITAMINB12, FOLATE, FERRITIN, TIBC, IRON, RETICCTPCT in the last 72 hours. Coags: No results for input(s): INR in the last 72 hours.  Invalid input(s): PT Microbiology: No results found for this or any previous visit (from the past 240 hour(s)).  FURTHER DISCHARGE  INSTRUCTIONS:  Get Medicines reviewed and adjusted: Please take all your medications with you for your next visit with your Primary MD  Laboratory/radiological data: Please request your Primary MD to go over all hospital tests and procedure/radiological results at the follow up, please ask your Primary MD to get all Hospital records sent to his/her office.  In some cases, they will be blood work, cultures and biopsy results pending at the time of your discharge. Please request that your primary care M.D. goes through all the records of your hospital data and follows up on these results.  Also Note the following: If you experience worsening of your admission symptoms, develop shortness of breath, life threatening emergency, suicidal or homicidal thoughts you must seek medical attention immediately by calling 911 or calling your MD immediately  if symptoms less severe.  You must read complete instructions/literature along with all the possible adverse reactions/side effects for all the Medicines you take and that have been prescribed to you. Take any new Medicines after you have completely understood and accpet all the possible adverse reactions/side effects.   Do not drive when taking Pain medications or sleeping medications (Benzodaizepines)  Do not take more than prescribed Pain, Sleep and Anxiety Medications. It is not advisable to combine anxiety,sleep and pain medications without talking with your primary care practitioner  Special Instructions: If you have smoked or chewed Tobacco  in the last 2 yrs please stop smoking, stop any regular Alcohol  and or any Recreational drug use.  Wear Seat belts while driving.  Please note: You were cared for by a hospitalist during your hospital stay. Once you are discharged, your primary care physician will handle any further medical issues. Please note that NO REFILLS for any discharge medications will be authorized once you are discharged, as it is  imperative that you return to your primary care physician (or establish a relationship with a primary care physician if you do not have one) for your post hospital discharge needs so that they can reassess your need for medications and monitor your lab values.  Total Time spent coordinating discharge including counseling, education and face to face time equals 45 minutes.  Signed: Anniah Glick 04/06/2018 1:17 PM

## 2018-04-06 NOTE — Progress Notes (Signed)
Patient off unit to ECHO.

## 2018-04-06 NOTE — Progress Notes (Signed)
  Echocardiogram 2D Echocardiogram has been performed.  Merrie Roof F 04/06/2018, 9:26 AM

## 2018-04-14 DIAGNOSIS — C61 Malignant neoplasm of prostate: Secondary | ICD-10-CM | POA: Diagnosis not present

## 2018-04-14 DIAGNOSIS — Z6826 Body mass index (BMI) 26.0-26.9, adult: Secondary | ICD-10-CM | POA: Diagnosis not present

## 2018-04-14 DIAGNOSIS — I712 Thoracic aortic aneurysm, without rupture: Secondary | ICD-10-CM | POA: Diagnosis not present

## 2018-04-14 DIAGNOSIS — I2699 Other pulmonary embolism without acute cor pulmonale: Secondary | ICD-10-CM | POA: Diagnosis not present

## 2018-05-01 DIAGNOSIS — H40053 Ocular hypertension, bilateral: Secondary | ICD-10-CM | POA: Diagnosis not present

## 2018-05-24 DIAGNOSIS — Z125 Encounter for screening for malignant neoplasm of prostate: Secondary | ICD-10-CM | POA: Diagnosis not present

## 2018-05-24 DIAGNOSIS — M859 Disorder of bone density and structure, unspecified: Secondary | ICD-10-CM | POA: Diagnosis not present

## 2018-05-24 DIAGNOSIS — E7849 Other hyperlipidemia: Secondary | ICD-10-CM | POA: Diagnosis not present

## 2018-05-26 DIAGNOSIS — Z1212 Encounter for screening for malignant neoplasm of rectum: Secondary | ICD-10-CM | POA: Diagnosis not present

## 2018-05-30 DIAGNOSIS — D692 Other nonthrombocytopenic purpura: Secondary | ICD-10-CM | POA: Diagnosis not present

## 2018-05-30 DIAGNOSIS — Z1389 Encounter for screening for other disorder: Secondary | ICD-10-CM | POA: Diagnosis not present

## 2018-05-30 DIAGNOSIS — M859 Disorder of bone density and structure, unspecified: Secondary | ICD-10-CM | POA: Diagnosis not present

## 2018-05-30 DIAGNOSIS — I712 Thoracic aortic aneurysm, without rupture: Secondary | ICD-10-CM | POA: Diagnosis not present

## 2018-05-30 DIAGNOSIS — I2699 Other pulmonary embolism without acute cor pulmonale: Secondary | ICD-10-CM | POA: Diagnosis not present

## 2018-05-30 DIAGNOSIS — Z7901 Long term (current) use of anticoagulants: Secondary | ICD-10-CM | POA: Diagnosis not present

## 2018-05-30 DIAGNOSIS — C61 Malignant neoplasm of prostate: Secondary | ICD-10-CM | POA: Diagnosis not present

## 2018-05-30 DIAGNOSIS — E7849 Other hyperlipidemia: Secondary | ICD-10-CM | POA: Diagnosis not present

## 2018-05-30 DIAGNOSIS — Z0000 Encounter for general adult medical examination without abnormal findings: Secondary | ICD-10-CM | POA: Diagnosis not present

## 2018-06-14 ENCOUNTER — Institutional Professional Consult (permissible substitution): Payer: Medicare HMO | Admitting: Surgery

## 2018-06-14 ENCOUNTER — Other Ambulatory Visit: Payer: Self-pay

## 2018-06-14 ENCOUNTER — Encounter: Payer: Self-pay | Admitting: Surgery

## 2018-06-14 VITALS — BP 140/60 | Resp 16 | Ht 68.0 in | Wt 178.0 lb

## 2018-06-14 DIAGNOSIS — I712 Thoracic aortic aneurysm, without rupture: Secondary | ICD-10-CM

## 2018-06-14 DIAGNOSIS — J439 Emphysema, unspecified: Secondary | ICD-10-CM | POA: Diagnosis not present

## 2018-06-14 DIAGNOSIS — I7121 Aneurysm of the ascending aorta, without rupture: Secondary | ICD-10-CM

## 2018-06-15 ENCOUNTER — Encounter: Payer: Self-pay | Admitting: Surgery

## 2018-06-15 NOTE — Progress Notes (Signed)
Cardiothoracic Surgery Consultation  PCP is Velna Hatchet, MD Referring Provider is Velna Hatchet, MD  Chief Complaint  Patient presents with  . TAA    CTA CHEST 04/05/18, ECHO 04/06/18    HPI:  The patient is an 84 year old gentleman with a history of prostate cancer status post seed implant who was admitted to [facility] from 04/04/2018 to 04/06/2018 with left-sided chest pain and was found to have a left lower extremity popliteal DVT and pulmonary embolism by CTA of the chest.  This also showed aneurysmal dilatation of the ascending aorta up to 4.6 cm.  He was treated with Lovenox and transition to Xarelto.  He has been doing well at home without chest pain or shortness of breath.  He is here today with his son.  He denies any history of heart disease.  There is no family history of aortic aneurysm, aortic dissection, or connective tissue disorder.  Past Medical History:  Diagnosis Date  . 1st degree AV block 09/14/2016  . Aortic aneurysm ([place])   . Arthritis   . Balanitis   . Dizziness 09/14/2016  . DVT (deep venous thrombosis) ([place]) 03/2018  . Prostate cancer ([place]) 2003   seed implants  . Pulmonary embolism ([place]) 03/2018  . Shortness of breath 09/16/2016  . Sinus bradycardia 09/16/2016    Past Surgical History:  Procedure Laterality Date  . FOOT SURGERY      Family History  Problem Relation Age of Onset  . Diabetes Sister   . Hypertension Mother        family history  . Colon cancer Neg Hx     Social History Social History   Tobacco Use  . Smoking status: Former Smoker    Packs/day: 1.00    Years: 20.00    Pack years: 20.00    Types: Cigarettes    Last attempt to quit: 09/14/1985    Years since quitting: 32.7  . Smokeless tobacco: Never Used  Substance Use Topics  . Alcohol use: No  . Drug use: No    Current Outpatient Medications  Medication Sig Dispense Refill  . pantoprazole (PROTONIX) 40 MG tablet Take 1 tablet (40 mg total) by mouth daily at 12  noon. 30 tablet 0  . Rivaroxaban 15 & 20 MG TBPK Take as directed on package: Start with one 15mg  tablet by mouth twice a day with food until 9/12 morning.. On 9/12 evening, switch to one 20mg  tablet once a day with food. 51 each 0  . traMADol (ULTRAM) 50 MG tablet Take 1 tablet (50 mg total) by mouth every 6 (six) hours as needed for moderate pain. 20 tablet 0   No current facility-administered medications for this visit.     No Known Allergies  Review of Systems  Constitutional: Negative.  Negative for fatigue.  HENT: Negative.   Eyes: Negative.   Respiratory: Negative for chest tightness and shortness of breath.   Cardiovascular: Negative for chest pain.  Gastrointestinal: Negative.   Endocrine: Negative.   Genitourinary: Negative.   Musculoskeletal: Negative.   Allergic/Immunologic: Negative.   Neurological: Negative for dizziness and syncope.  Hematological: Negative.   Psychiatric/Behavioral: Negative.     BP 140/60 (BP Location: Right Arm, Patient Position: Sitting, Cuff Size: Large)   Resp 16   Ht 5\' 8"  (1.727 m)   Wt 178 lb (80.7 kg)   SpO2 97% Comment: ON RA  BMI 27.06 kg/m  Physical Exam  Constitutional: He is oriented to person, place, and time.  Well-developed elderly gentleman in no distress  HENT:  Head: Normocephalic and atraumatic.  Mouth/Throat: Oropharynx is clear and moist.  Eyes: Pupils are equal, round, and reactive to light. EOM are normal.  Neck: Normal range of motion. Neck supple. No JVD present.  Cardiovascular: Normal rate, regular rhythm and normal heart sounds.  No murmur heard. Pulmonary/Chest: Effort normal and breath sounds normal. No respiratory distress.  Abdominal: Soft. Bowel sounds are normal. He exhibits no distension. There is no tenderness.  Musculoskeletal: Normal range of motion. He exhibits no edema.  Lymphadenopathy:    He has no cervical adenopathy.  Neurological: He is alert and oriented to person, place, and time.  Skin:  Skin is warm and dry.  Psychiatric: He has a normal mood and affect.     Diagnostic Tests:  CLINICAL DATA:  Left-sided chest pain  EXAM: CT ANGIOGRAPHY CHEST WITH CONTRAST  TECHNIQUE: Multidetector CT imaging of the chest was performed using the standard protocol during bolus administration of intravenous contrast. Multiplanar CT image reconstructions and MIPs were obtained to evaluate the vascular anatomy.  CONTRAST:  86mL ISOVUE-370 IOPAMIDOL (ISOVUE-370) INJECTION 76%  COMPARISON:  Chest x-ray 04/04/2018  FINDINGS: Cardiovascular: Satisfactory opacification of the pulmonary arteries to the segmental level. Small nonocclusive emboli with left upper lobe segmental branches. Aneurysmal dilatation of the ascending aorta up to 4.6 cm. Moderate aortic atherosclerosis. Coronary vascular calcification. Mild cardiomegaly. No pericardial effusion  Mediastinum/Nodes: Midline trachea. No thyroid mass. No significantly enlarged lymph nodes. Hiatal hernia with mild distal esophageal thickening.  Lungs/Pleura: No acute consolidation or effusion. Moderate emphysema. Bronchial wall thickening in the left greater than right lower lobe with atelectasis or mild infiltrate at the left base. No pleural effusion  Upper Abdomen: Cyst within the upper pole of the left kidney. No acute abnormality  Musculoskeletal: Moderate compression deformity L1.  Review of the MIP images confirms the above findings.  IMPRESSION: 1. Positive for small acute emboli within segmental left upper lobe pulmonary branch vessels. 2. Aneurysmal dilatation of the ascending aorta up to 4.6 cm. Ascending thoracic aortic aneurysm. Recommend semi-annual imaging followup by CTA or MRA and referral to cardiothoracic surgery if not already obtained. This recommendation follows 2010 ACCF/AHA/AATS/ACR/ASA/SCA/SCAI/SIR/STS/SVM Guidelines for the Diagnosis and Management of Patients With Thoracic Aortic  Disease. Circulation. 2010; 121: Y099-I338 3. Emphysema 4. Hiatal hernia with distal esophageal thickening, possible reflux or mild esophagitis.  Critical Value/emergent results were called by telephone at the time of interpretation on 04/05/2018 at 2:38 am to Dr. Theotis Burrow , who verbally acknowledged these results.  Aortic Atherosclerosis (ICD10-I70.0) and Emphysema (ICD10-J43.9).Aortic aneurysm NOS (ICD10-I71.9).   Electronically Signed   By: Donavan Foil M.D.   On: 04/05/2018 02:38  Impression:  This 84 year old gentleman has an incidental 4.6 cm fusiform ascending aortic aneurysm noted on CTA of the chest done for left-sided chest pain secondary to pulmonary embolism.  This is well below the 5.5 cm surgical threshold.  I reviewed the CTA images with him and his son and answered their questions.  I discussed the importance of good blood pressure control and preventing further enlargement of the aorta and aortic dissection.  I have recommended that he have a follow-up CTA of the chest in 1 year.   Plan:  He will return to see me in 1 year with a CTA of the chest.  I spent 30 minutes performing this consultation and > 50% of this time was spent face to face counseling and coordinating the care of this patient's  ascending aortic aneurysm.  Gaye Pollack, MD [facility] [phone]

## 2018-08-16 HISTORY — PX: CATARACT EXTRACTION W/ INTRAOCULAR LENS IMPLANT: SHX1309

## 2018-10-30 DIAGNOSIS — C61 Malignant neoplasm of prostate: Secondary | ICD-10-CM | POA: Diagnosis not present

## 2018-11-29 DIAGNOSIS — C61 Malignant neoplasm of prostate: Secondary | ICD-10-CM | POA: Diagnosis not present

## 2018-11-29 DIAGNOSIS — R030 Elevated blood-pressure reading, without diagnosis of hypertension: Secondary | ICD-10-CM | POA: Diagnosis not present

## 2018-11-29 DIAGNOSIS — M8580 Other specified disorders of bone density and structure, unspecified site: Secondary | ICD-10-CM | POA: Diagnosis not present

## 2018-11-29 DIAGNOSIS — I712 Thoracic aortic aneurysm, without rupture: Secondary | ICD-10-CM | POA: Diagnosis not present

## 2018-11-29 DIAGNOSIS — I2699 Other pulmonary embolism without acute cor pulmonale: Secondary | ICD-10-CM | POA: Diagnosis not present

## 2018-12-12 DIAGNOSIS — C61 Malignant neoplasm of prostate: Secondary | ICD-10-CM | POA: Diagnosis not present

## 2018-12-12 DIAGNOSIS — R351 Nocturia: Secondary | ICD-10-CM | POA: Diagnosis not present

## 2019-02-26 ENCOUNTER — Other Ambulatory Visit: Payer: Self-pay

## 2019-02-26 ENCOUNTER — Encounter (HOSPITAL_COMMUNITY): Payer: Self-pay

## 2019-02-26 ENCOUNTER — Ambulatory Visit (HOSPITAL_COMMUNITY)
Admission: EM | Admit: 2019-02-26 | Discharge: 2019-02-26 | Disposition: A | Discharge status: Home / Self Care | Payer: Medicare HMO | Attending: Urgent Care | Admitting: Urgent Care

## 2019-02-26 DIAGNOSIS — R300 Dysuria: Secondary | ICD-10-CM | POA: Diagnosis not present

## 2019-02-26 DIAGNOSIS — R319 Hematuria, unspecified: Secondary | ICD-10-CM | POA: Diagnosis not present

## 2019-02-26 DIAGNOSIS — Z8546 Personal history of malignant neoplasm of prostate: Secondary | ICD-10-CM

## 2019-02-26 DIAGNOSIS — R350 Frequency of micturition: Secondary | ICD-10-CM | POA: Diagnosis not present

## 2019-02-26 LAB — POCT URINALYSIS DIP (DEVICE)
Bilirubin Urine: NEGATIVE
Glucose, UA: NEGATIVE mg/dL
Ketones, ur: NEGATIVE mg/dL
Leukocytes,Ua: NEGATIVE
Nitrite: NEGATIVE
Protein, ur: NEGATIVE mg/dL
Specific Gravity, Urine: 1.025 (ref 1.005–1.030)
Urobilinogen, UA: 0.2 mg/dL (ref 0.0–1.0)
pH: 6 (ref 5.0–8.0)

## 2019-02-26 MED ORDER — CEPHALEXIN 500 MG PO CAPS: 500.0000 mg | ORAL_CAPSULE | Freq: Three times a day (TID) | ORAL | 0 refills | Status: DC

## 2019-02-26 NOTE — ED Provider Notes (Signed)
MRN: [id number] DOB: Aug 09, 1934  Subjective:   Connor Morris is a 85 y.o. male presenting for 1 week history of recurrent mild to moderate hematuria, dysuria, urinary frequency.  Patient reports that the last time he saw blood in his urine was this morning.  He has a remote history of prostate cancer treated surgically per patient.  Denies smoking cigarettes.  He is concerned about STI as he reports having had this close to a year ago and thinks that this is a problem today.  Denies being sexually active since then.  Reports that he was treated.  No current facility-administered medications for this encounter.   Current Outpatient Medications:  .  XARELTO 20 MG TABS tablet, TK 1 T PO QD, Disp: , Rfl:  .  pantoprazole (PROTONIX) 40 MG tablet, Take 1 tablet (40 mg total) by mouth daily at 12 noon., Disp: 30 tablet, Rfl: 0 .  Rivaroxaban 15 & 20 MG TBPK, Take as directed on package: Start with one 15mg  tablet by mouth twice a day with food until 9/12 morning.. On 9/12 evening, switch to one 20mg  tablet once a day with food., Disp: 51 each, Rfl: 0 .  traMADol (ULTRAM) 50 MG tablet, Take 1 tablet (50 mg total) by mouth every 6 (six) hours as needed for moderate pain., Disp: 20 tablet, Rfl: 0   No Known Allergies  Past Medical History:  Diagnosis Date  . 1st degree AV block 09/14/2016  . Aortic aneurysm ([place])   . Arthritis   . Balanitis   . Dizziness 09/14/2016  . DVT (deep venous thrombosis) ([place]) 03/2018  . Prostate cancer ([place]) 2003   seed implants  . Pulmonary embolism ([place]) 03/2018  . Shortness of breath 09/16/2016  . Sinus bradycardia 09/16/2016     Past Surgical History:  Procedure Laterality Date  . FOOT SURGERY      ROS  Objective:   Vitals: BP 138/81 (BP Location: Right Arm)   Pulse 65   Temp 98.1 F (36.7 C) (Oral)   Resp 16   SpO2 98%   Physical Exam Constitutional:      Appearance: He is well-developed.  Eyes:     General: No scleral icterus. Cardiovascular:    Rate and Rhythm: Normal rate and regular rhythm.     Heart sounds: No murmur. No friction rub. No gallop.   Pulmonary:     Effort: No respiratory distress.     Breath sounds: No wheezing or rales.  Abdominal:     General: Bowel sounds are normal. There is no distension.     Palpations: Abdomen is soft. There is no mass.     Tenderness: There is no abdominal tenderness. There is no guarding or rebound.  Skin:    General: Skin is warm and dry.  Neurological:     Mental Status: He is alert and oriented to person, place, and time.     Results for orders placed or performed during the hospital encounter of 02/26/19 (from the past 24 hour(s))  POCT urinalysis dip (device)     Status: Abnormal   Collection Time: 02/26/19 10:11 AM  Result Value Ref Range   Glucose, UA NEGATIVE NEGATIVE mg/dL   Bilirubin Urine NEGATIVE NEGATIVE   Ketones, ur NEGATIVE NEGATIVE mg/dL   Specific Gravity, Urine 1.025 1.005 - 1.030   Hgb urine dipstick LARGE (A) NEGATIVE   pH 6.0 5.0 - 8.0   Protein, ur NEGATIVE NEGATIVE mg/dL   Urobilinogen, UA 0.2 0.0 - 1.0 mg/dL  Nitrite NEGATIVE NEGATIVE   Leukocytes,Ua NEGATIVE NEGATIVE    Assessment and Plan :   1. Hematuria, unspecified type   2. History of prostate cancer   3. Urinary frequency   4. Dysuria     Counseled patient extensively on need to have follow-up with his urologist or to contact [facility] for consult.  I am concerned that he has hematuria again in the setting of his history of prostate cancer.  Information for this practice was provided to the patient.  We will do STI testing and a urine culture, treat for cystitis in the meantime with Keflex.  Recommended patient follow-up with PCP as well. Counseled patient on potential for adverse effects with medications prescribed/recommended today, ER and return-to-clinic precautions discussed, patient verbalized understanding.    Jaynee Eagles, PA-C 02/26/19 1058

## 2019-02-26 NOTE — ED Triage Notes (Signed)
Patient presents to Urgent Care with complaints of burning with urination since about a week ago. Patient reports he saw some blood in his urine last night but has not seen any today. Pt has hx of similar problems.

## 2019-02-26 NOTE — Discharge Instructions (Addendum)
Please contact [facility] for a recheck on your prostate cancer. Your PCP can set up a referral for you. In the meantime, I am going to cover you for an urinary tract infection.  [facility] in [place], [place] Address: [address] [place], [place], [place] [address] Phone: [phone]

## 2019-02-27 LAB — URINE CULTURE: Culture: <10000 — AB

## 2019-02-28 LAB — URINE CYTOLOGY ANCILLARY ONLY
Chlamydia: NEGATIVE
Neisseria Gonorrhea: NEGATIVE
Trichomonas: NEGATIVE

## 2019-03-23 ENCOUNTER — Ambulatory Visit (HOSPITAL_COMMUNITY)
Admission: EM | Admit: 2019-03-23 | Discharge: 2019-03-23 | Discharge status: Against Medical Advice | Payer: Medicare HMO

## 2019-04-26 DIAGNOSIS — H2513 Age-related nuclear cataract, bilateral: Secondary | ICD-10-CM | POA: Diagnosis not present

## 2019-04-26 DIAGNOSIS — H524 Presbyopia: Secondary | ICD-10-CM | POA: Diagnosis not present

## 2019-04-26 DIAGNOSIS — H401131 Primary open-angle glaucoma, bilateral, mild stage: Secondary | ICD-10-CM | POA: Diagnosis not present

## 2019-04-28 DIAGNOSIS — Z03818 Encounter for observation for suspected exposure to other biological agents ruled out: Secondary | ICD-10-CM | POA: Diagnosis not present

## 2019-05-04 DIAGNOSIS — H524 Presbyopia: Secondary | ICD-10-CM | POA: Diagnosis not present

## 2019-05-04 DIAGNOSIS — H2513 Age-related nuclear cataract, bilateral: Secondary | ICD-10-CM | POA: Diagnosis not present

## 2019-05-29 DIAGNOSIS — Z125 Encounter for screening for malignant neoplasm of prostate: Secondary | ICD-10-CM | POA: Diagnosis not present

## 2019-05-29 DIAGNOSIS — E7849 Other hyperlipidemia: Secondary | ICD-10-CM | POA: Diagnosis not present

## 2019-05-29 DIAGNOSIS — M859 Disorder of bone density and structure, unspecified: Secondary | ICD-10-CM | POA: Diagnosis not present

## 2019-06-04 DIAGNOSIS — I712 Thoracic aortic aneurysm, without rupture: Secondary | ICD-10-CM | POA: Diagnosis not present

## 2019-06-04 DIAGNOSIS — E785 Hyperlipidemia, unspecified: Secondary | ICD-10-CM | POA: Diagnosis not present

## 2019-06-04 DIAGNOSIS — M8580 Other specified disorders of bone density and structure, unspecified site: Secondary | ICD-10-CM | POA: Diagnosis not present

## 2019-06-04 DIAGNOSIS — D692 Other nonthrombocytopenic purpura: Secondary | ICD-10-CM | POA: Diagnosis not present

## 2019-06-04 DIAGNOSIS — E559 Vitamin D deficiency, unspecified: Secondary | ICD-10-CM | POA: Insufficient documentation

## 2019-06-04 DIAGNOSIS — Z0000 Encounter for general adult medical examination without abnormal findings: Secondary | ICD-10-CM | POA: Diagnosis not present

## 2019-06-04 DIAGNOSIS — C61 Malignant neoplasm of prostate: Secondary | ICD-10-CM | POA: Diagnosis not present

## 2019-06-04 DIAGNOSIS — I2699 Other pulmonary embolism without acute cor pulmonale: Secondary | ICD-10-CM | POA: Diagnosis not present

## 2019-06-04 DIAGNOSIS — Z7689 Persons encountering health services in other specified circumstances: Secondary | ICD-10-CM | POA: Diagnosis not present

## 2019-06-05 DIAGNOSIS — C61 Malignant neoplasm of prostate: Secondary | ICD-10-CM | POA: Diagnosis not present

## 2019-06-07 ENCOUNTER — Other Ambulatory Visit: Payer: Self-pay | Admitting: Surgery

## 2019-06-07 DIAGNOSIS — I712 Thoracic aortic aneurysm, without rupture, unspecified: Secondary | ICD-10-CM

## 2019-06-19 DIAGNOSIS — H2511 Age-related nuclear cataract, right eye: Secondary | ICD-10-CM | POA: Diagnosis not present

## 2019-06-19 DIAGNOSIS — H25811 Combined forms of age-related cataract, right eye: Secondary | ICD-10-CM | POA: Diagnosis not present

## 2019-06-27 ENCOUNTER — Inpatient Hospital Stay: Admission: RE | Admit: 2019-06-27 | Payer: Medicare HMO | Source: Ambulatory Visit

## 2019-07-04 ENCOUNTER — Ambulatory Visit: Payer: Medicare HMO | Admitting: Surgery

## 2019-07-09 ENCOUNTER — Encounter (HOSPITAL_COMMUNITY): Payer: Self-pay | Admitting: Emergency Medicine

## 2019-07-09 ENCOUNTER — Other Ambulatory Visit: Payer: Self-pay

## 2019-07-09 ENCOUNTER — Emergency Department (HOSPITAL_COMMUNITY)
Admission: EM | Admit: 2019-07-09 | Discharge: 2019-07-09 | Disposition: A | Discharge status: Home / Self Care | Payer: Medicare HMO | Attending: Emergency Medicine | Admitting: Emergency Medicine

## 2019-07-09 DIAGNOSIS — Z87891 Personal history of nicotine dependence: Secondary | ICD-10-CM | POA: Insufficient documentation

## 2019-07-09 DIAGNOSIS — R319 Hematuria, unspecified: Secondary | ICD-10-CM | POA: Insufficient documentation

## 2019-07-09 DIAGNOSIS — I82409 Acute embolism and thrombosis of unspecified deep veins of unspecified lower extremity: Secondary | ICD-10-CM | POA: Insufficient documentation

## 2019-07-09 DIAGNOSIS — Z79899 Other long term (current) drug therapy: Secondary | ICD-10-CM | POA: Diagnosis not present

## 2019-07-09 DIAGNOSIS — Z7901 Long term (current) use of anticoagulants: Secondary | ICD-10-CM | POA: Insufficient documentation

## 2019-07-09 DIAGNOSIS — I2699 Other pulmonary embolism without acute cor pulmonale: Secondary | ICD-10-CM | POA: Insufficient documentation

## 2019-07-09 DIAGNOSIS — Z8546 Personal history of malignant neoplasm of prostate: Secondary | ICD-10-CM | POA: Insufficient documentation

## 2019-07-09 LAB — CBC
HCT: 46.3 % (ref 39.0–52.0)
Hemoglobin: 14.6 g/dL (ref 13.0–17.0)
MCH: 29.6 pg (ref 26.0–34.0)
MCHC: 31.5 g/dL (ref 30.0–36.0)
MCV: 93.9 fL (ref 80.0–100.0)
Platelets: 241 10*3/uL (ref 150–400)
RBC: 4.93 MIL/uL (ref 4.22–5.81)
RDW: 12.2 % (ref 11.5–15.5)
WBC: 4.6 10*3/uL (ref 4.0–10.5)
nRBC: 0 % (ref 0.0–0.2)

## 2019-07-09 LAB — BASIC METABOLIC PANEL
Anion gap: 9 (ref 5–15)
BUN: 13 mg/dL (ref 8–23)
CO2: 29 mmol/L (ref 22–32)
Calcium: 10.2 mg/dL (ref 8.9–10.3)
Chloride: 102 mmol/L (ref 98–111)
Creatinine, Ser: 0.99 mg/dL (ref 0.61–1.24)
GFR calc Af Amer: >60 mL/min (ref >60)
GFR calc non Af Amer: >60 mL/min (ref >60)
Glucose, Bld: 97 mg/dL (ref 70–99)
Potassium: 4.1 mmol/L (ref 3.5–5.1)
Sodium: 140 mmol/L (ref 135–145)

## 2019-07-09 LAB — URINALYSIS, ROUTINE W REFLEX MICROSCOPIC: Hgb urine dipstick: NEGATIVE

## 2019-07-09 LAB — URINALYSIS, MICROSCOPIC (REFLEX)
RBC / HPF: >50 RBC/hpf (ref 0–5)
Squamous Epithelial / LPF: NONE SEEN (ref 0–5)

## 2019-07-09 NOTE — Discharge Instructions (Addendum)
Follow-up with urology please see information attached

## 2019-07-09 NOTE — ED Provider Notes (Signed)
[facility] EMERGENCY DEPARTMENT Provider Note   CSN: [id number] Arrival date & time: 07/09/19  [id number]     History   Chief Complaint Chief Complaint  Patient presents with  . Hematuria    HPI Bronxton Aldous is a 85 y.o. male history of approximately 30-pack-year smoking, prostate cancer status post radiation years ago, currently on rivaroxaban for DVT in the past.      HPI  Patient presents for 1 week of intermittent since hematuria that he notices throughout his stream.  States that her past 3 days it has been constant instead of intermittent.  Denies any pain associated with this, denies abdominal pain, weakness, dizziness, lightheadedness, flank pain, difficulty urinating, pain with urinating, pain with defecation.  Patient denies any history of hematuria prior to 1 week ago.  Patient states he is not currently sexually active without sex 1 month ago.  Did not use protection.  Denies any concern for sexually transmitted diseases.  Denies any dyspareunia or hernia.  Denies any penile discharge, penile pain, testicular pain.  States he does not see primary care doctor.  Denies following up with urologist since his prostate radiation.  Denies any leg swelling, kidney problems, other medical problems.  My review of EMR patient was seen in urgent care 02/26/2019 for hematuria and discharged with instructions to follow-up with urology.  At this time he was treated with Keflex empirically and had urine culture which resulted negative.  Past Medical History:  Diagnosis Date  . 1st degree AV block 09/14/2016  . Aortic aneurysm ([place])   . Arthritis   . Balanitis   . Dizziness 09/14/2016  . DVT (deep venous thrombosis) ([place]) 03/2018  . Prostate cancer ([place]) 2003   seed implants  . Pulmonary embolism ([place]) 03/2018  . Shortness of breath 09/16/2016  . Sinus bradycardia 09/16/2016    Patient Active Problem List   Diagnosis Date Noted  . Acute pulmonary embolism without acute  cor pulmonale ([place]) 04/05/2018  . History of prostate cancer 04/05/2018  . Thoracic ascending aortic aneurysm ([place]) 04/05/2018  . Sinus bradycardia 09/16/2016  . Shortness of breath 09/16/2016  . Bradycardia 09/14/2016  . 1st degree AV block 09/14/2016  . Dizziness 09/14/2016    Past Surgical History:  Procedure Laterality Date  . FOOT SURGERY          Home Medications    Prior to Admission medications   Medication Sig Start Date End Date Taking? Authorizing Provider  cephALEXin (KEFLEX) 500 MG capsule Take 1 capsule (500 mg total) by mouth 3 (three) times daily. 02/26/19   Jaynee Eagles, PA-C  pantoprazole (PROTONIX) 40 MG tablet Take 1 tablet (40 mg total) by mouth daily at 12 noon. 04/06/18   Ghimire, Henreitta Leber, MD  Rivaroxaban 15 & 20 MG TBPK Take as directed on package: Start with one 15mg  tablet by mouth twice a day with food until 9/12 morning.. On 9/12 evening, switch to one 20mg  tablet once a day with food. 04/06/18   Ghimire, Henreitta Leber, MD  traMADol (ULTRAM) 50 MG tablet Take 1 tablet (50 mg total) by mouth every 6 (six) hours as needed for moderate pain. 04/06/18   Ghimire, Henreitta Leber, MD  XARELTO 20 MG TABS tablet TK 1 T PO QD 02/21/19   [provider]    Family History Family History  Problem Relation Age of Onset  . Diabetes Sister   . Hypertension Mother        family history  .  Healthy Father   . Colon cancer Neg Hx     Social History Social History   Tobacco Use  . Smoking status: Former Smoker    Packs/day: 1.00    Years: 20.00    Pack years: 20.00    Types: Cigarettes    Quit date: 09/14/1985    Years since quitting: 33.8  . Smokeless tobacco: Never Used  Substance Use Topics  . Alcohol use: No  . Drug use: No     Allergies   Patient has no known allergies.   Review of Systems Review of Systems  Constitutional: Negative for chills and fever.  HENT: Negative for congestion and ear pain.   Eyes: Negative for pain.  Respiratory:  Negative for cough and shortness of breath.   Cardiovascular: Negative for chest pain.  Gastrointestinal: Negative for abdominal pain, blood in stool, constipation, diarrhea, nausea and vomiting.  Endocrine: Negative for polydipsia and polyuria.  Genitourinary: Positive for hematuria. Negative for difficulty urinating and dysuria.  Musculoskeletal: Negative for myalgias.  Neurological: Negative for dizziness and headaches.     Physical Exam Updated Vital Signs BP (!) 135/51   Pulse (!) 51   Temp 97.7 F (36.5 C) (Oral)   Resp 16   Ht 5\' 8"  (1.727 m)   Wt 72.6 kg   SpO2 100%   BMI 24.33 kg/m   Physical Exam Vitals signs and nursing note reviewed.  Constitutional:      General: He is not in acute distress. HENT:     Head: Normocephalic and atraumatic.     Nose: Nose normal.  Eyes:     General: No scleral icterus. Neck:     Musculoskeletal: Normal range of motion.  Cardiovascular:     Rate and Rhythm: Normal rate and regular rhythm.     Pulses: Normal pulses.     Heart sounds: Normal heart sounds.  Pulmonary:     Effort: Pulmonary effort is normal. No respiratory distress.     Breath sounds: No wheezing.  Abdominal:     General: Abdomen is flat. Bowel sounds are normal.     Palpations: Abdomen is soft.     Tenderness: There is no abdominal tenderness. There is no right CVA tenderness, left CVA tenderness or rebound.     Hernia: No hernia is present.     Comments: Patient is thin   Musculoskeletal:     Right lower leg: No edema.     Left lower leg: No edema.  Skin:    General: Skin is warm and dry.     Capillary Refill: Capillary refill takes less than 2 seconds.  Neurological:     Mental Status: He is alert. Mental status is at baseline.  Psychiatric:        Mood and Affect: Mood normal.        Behavior: Behavior normal.      ED Treatments / Results  Labs (all labs ordered are listed, but only abnormal results are displayed) Labs Reviewed  URINALYSIS,  ROUTINE W REFLEX MICROSCOPIC - Abnormal; Notable for the following components:      Result Value   Color, Urine RED (*)    APPearance TURBID (*)    Glucose, UA   (*)    Value: TEST NOT REPORTED DUE TO COLOR INTERFERENCE OF URINE PIGMENT   Bilirubin Urine   (*)    Value: TEST NOT REPORTED DUE TO COLOR INTERFERENCE OF URINE PIGMENT   Ketones, ur   (*)  Value: TEST NOT REPORTED DUE TO COLOR INTERFERENCE OF URINE PIGMENT   Protein, ur   (*)    Value: TEST NOT REPORTED DUE TO COLOR INTERFERENCE OF URINE PIGMENT   Nitrite   (*)    Value: TEST NOT REPORTED DUE TO COLOR INTERFERENCE OF URINE PIGMENT   Leukocytes,Ua   (*)    Value: TEST NOT REPORTED DUE TO COLOR INTERFERENCE OF URINE PIGMENT   All other components within normal limits  URINALYSIS, MICROSCOPIC (REFLEX) - Abnormal; Notable for the following components:   Bacteria, UA FEW (*)    All other components within normal limits  URINE CULTURE  CBC  BASIC METABOLIC PANEL    EKG None  Radiology No results found.  Procedures Procedures (including critical care time)  Medications Ordered in ED Medications - No data to display   Initial Impression / Assessment and Plan / ED Course  I have reviewed the triage vital signs and the nursing notes.  Pertinent labs & imaging results that were available during my care of the patient were reviewed by me and considered in my medical decision making (see chart for details).        Patient is a 85 year old male with significant smoking history, on rivaroxaban presenting for intermittent hematuria for 1 week on chart review it appears that this is been an ongoing problem for 4 months.  Differential includes nephrotic syndrome, bladder cancer, urethritis, kidney stone, exercise-induced hematuria, anticoagulant use, complication related to prostate cancer.   Patient is poor historian initially states that he is not on any medications however when asked if he is taking blood thinner he  states that he still is.  States that he misses no doses of this.  Denies any history of hematuria however when asked about urgent care visit he states that he did have it in the past.  This patient is compliant with his overactive and I suspect this is likely due to anticoagulant use.  However, as patient is elderly with smoking history I discussed with him that he is at high risk for bladder cancer.  Recommend that he follow-up with urology.  This may require cystoscopy at the discretion of specialist.  Patient is otherwise well-appearing with vitals within normal limits patient is slightly bradycardic however this appears to be normal for him.  Has a his heart rate admission 1 year ago for his DVT and PE was 60.  Patient denies any lightheadedness, dizziness, easy bleeding or bruising elsewhere in his body.  I suspect that he is stable for discharge and discussed follow-up with him.  He is agreeable to follow-up with urology.  As he is having no pain today I do not feel there is an indication for a CT scan or urgent urology consult.  Discharged in good condition.  Understanding of return precautions which include inability to urinate, pain.     Final Clinical Impressions(s) / ED Diagnoses   Final diagnoses:  Painless hematuria    ED Discharge Orders    None       Tedd Sias, [place] 07/09/19 1042    Little, Wenda Overland, MD 07/15/19 1944

## 2019-07-09 NOTE — ED Triage Notes (Signed)
Pt endorses blood in his urine for about a week or 2. Denies any pain.

## 2019-07-10 LAB — URINE CULTURE: Culture: NO GROWTH

## 2019-07-16 ENCOUNTER — Ambulatory Visit
Admission: RE | Admit: 2019-07-16 | Discharge: 2019-07-16 | Disposition: A | Discharge status: Home / Self Care | Payer: Medicare HMO | Source: Ambulatory Visit | Attending: Surgery | Admitting: Surgery

## 2019-07-16 DIAGNOSIS — I712 Thoracic aortic aneurysm, without rupture, unspecified: Secondary | ICD-10-CM

## 2019-07-16 MED ORDER — IOPAMIDOL (ISOVUE-370) INJECTION 76%
75.0000 mL | Freq: Once | INTRAVENOUS | Status: AC | PRN
  Administered 2019-07-16: 75 mL via INTRAVENOUS

## 2019-07-18 ENCOUNTER — Ambulatory Visit: Payer: Medicare HMO | Admitting: Surgery

## 2019-07-18 ENCOUNTER — Encounter: Payer: Self-pay | Admitting: Surgery

## 2019-07-18 ENCOUNTER — Other Ambulatory Visit: Payer: Self-pay

## 2019-07-18 VITALS — BP 122/70 | HR 73 | Temp 97.7°F | Resp 20 | Ht 68.0 in | Wt 160.0 lb

## 2019-07-18 DIAGNOSIS — I7121 Aneurysm of the ascending aorta, without rupture: Secondary | ICD-10-CM

## 2019-07-18 DIAGNOSIS — I712 Thoracic aortic aneurysm, without rupture: Secondary | ICD-10-CM

## 2019-07-18 NOTE — Progress Notes (Signed)
HPI:  The patient is an 85 year old gentleman who returns today for follow-up of a 4.6 cm fusiform ascending aortic aneurysm that was found on a CTA of the chest in August 2019 that was done after he presented with a left lower extremity DVT and pulmonary embolism.  I saw him in the office on 06/14/2018 and we decided to do a follow-up scan in 1 year.  He has been doing well over the past year with no change in his medical history.  He denies any chest or back pain.  Denies shortness of breath.  Current Outpatient Medications  Medication Sig Dispense Refill  . pantoprazole (PROTONIX) 40 MG tablet Take 1 tablet (40 mg total) by mouth daily at 12 noon. 30 tablet 0  . Rivaroxaban 15 & 20 MG TBPK Take as directed on package: Start with one 15mg  tablet by mouth twice a day with food until 9/12 morning.. On 9/12 evening, switch to one 20mg  tablet once a day with food. 51 each 0  . traMADol (ULTRAM) 50 MG tablet Take 1 tablet (50 mg total) by mouth every 6 (six) hours as needed for moderate pain. 20 tablet 0  . XARELTO 20 MG TABS tablet TK 1 T PO QD    . cephALEXin (KEFLEX) 500 MG capsule Take 1 capsule (500 mg total) by mouth 3 (three) times daily. (Patient not taking: Reported on 07/18/2019) 30 capsule 0   No current facility-administered medications for this visit.      Physical Exam: BP 122/70   Pulse 73   Temp 97.7 F (36.5 C) (Skin)   Resp 20   Ht 5\' 8"  (1.727 m)   Wt 160 lb (72.6 kg)   SpO2 98% Comment: RA  BMI 24.33 kg/m  He looks well. Cardiac exam shows a regular rate and rhythm with normal heart sounds.  There is no murmur. Lungs are clear.  Diagnostic Tests:  CLINICAL DATA:  Ascending thoracic aortic prominence. History of prostate carcinoma  EXAM: CT ANGIOGRAPHY CHEST WITH CONTRAST  TECHNIQUE: Multidetector CT imaging of the chest was performed using the standard protocol during bolus administration of intravenous contrast. Multiplanar CT image reconstructions  and MIPs were obtained to evaluate the vascular anatomy.  CONTRAST:  49mL ISOVUE-370 IOPAMIDOL (ISOVUE-370) INJECTION 76%  COMPARISON:  April 05, 2018  FINDINGS: Cardiovascular: Ascending thoracic aortic diameter measures 4.6 x 4.5 cm, essentially stable. The measured diameter at the sinuses of Valsalva measures 3.9 cm. The measured diameter in the aortic arch measures 3.0 cm. The measured diameter of the descending aorta at the pulmonary outflow tract level measures 2.7 x 2.6 cm. There is no evident thoracic aortic dissection. There are scattered foci of calcification in the visualized great vessels. There is aortic atherosclerosis. There is no appreciable pericardial effusion or pericardial thickening.  There is no demonstrable pulmonary embolus.  Mediastinum/Nodes: Thyroid is mildly inhomogeneous without dominant mass. There is no appreciable thoracic adenopathy. There is a hiatal hernia with distal esophageal thickening, essentially stable.  Lungs/Pleura: There is a degree of underlying centrilobular emphysematous change. There is central peribronchial thickening, stable. There are areas of patchy atelectasis bilaterally. There is no edema or consolidation. No pleural effusions are evident.  Upper Abdomen: There is upper abdominal aortic atherosclerosis. There are cysts in the upper pole left kidney, largest measuring 3.9 x 3.5 cm. Visualized upper abdominal structures otherwise appear unremarkable.  Musculoskeletal: There is persistent and stable compression of the L1 vertebral body with kyphosis at the thoracolumbar junction.  Bones are diffusely osteoporotic. No blastic or lytic bone lesions are evident. There are no chest wall lesions.  Review of the MIP images confirms the above findings.  IMPRESSION: 1. Essentially stable prominence of the ascending thoracic aorta, measuring 4.6 x 4.5 cm. Other measurements as described. No evident dissection. Ascending  thoracic aortic aneurysm. Recommend semi-annual imaging followup by CTA or MRA and referral to cardiothoracic surgery if not already obtained. This recommendation follows 2010 ACCF/AHA/AATS/ACR/ASA/SCA/SCAI/SIR/STS/SVM Guidelines for the Diagnosis and Management of Patients With Thoracic Aortic Disease. Circulation. 2010; 121ML:4928372. Aortic aneurysm NOS (ICD10-I71.9). There is aortic and great vessel atherosclerosis.  2.  No demonstrable pulmonary embolus.  3. Underlying emphysematous change. Central peribronchial thickening consistent chronic bronchitis. There are areas of patchy atelectasis. No edema or consolidation. No pleural effusions.  4.  No evident adenopathy.  5. Persistent hiatal hernia with distal esophageal thickening, grossly stable compared to prior CT examination.  Aortic Atherosclerosis (ICD10-I70.0) and Emphysema (ICD10-J43.9).   Electronically Signed   By: Lowella Grip III M.D.   On: 07/16/2019 11:27   Impression:  He has a stable 4.6 cm fusiform ascending aortic aneurysm.  This is still well below the 5.5 cm surgical threshold.  I reviewed the CTA images with him and his son and answered their questions.  His blood pressure is under good control and I stressed the importance of that and preventing further enlargement and acute aortic dissection.  Plan:  I will plan to see him back in 1 year with a CTA of the chest.  I spent 15 minutes performing this established patient evaluation and > 50% of this time was spent face to face counseling and coordinating the care of this patient's aortic aneurysm.    Gaye Pollack, MD [facility] [phone]

## 2019-07-24 DIAGNOSIS — R310 Gross hematuria: Secondary | ICD-10-CM | POA: Diagnosis not present

## 2019-08-16 DIAGNOSIS — N281 Cyst of kidney, acquired: Secondary | ICD-10-CM | POA: Diagnosis not present

## 2019-08-16 DIAGNOSIS — R310 Gross hematuria: Secondary | ICD-10-CM | POA: Diagnosis not present

## 2019-08-30 ENCOUNTER — Other Ambulatory Visit: Payer: Self-pay | Admitting: Urology

## 2019-08-30 DIAGNOSIS — R310 Gross hematuria: Secondary | ICD-10-CM | POA: Diagnosis not present

## 2019-09-12 ENCOUNTER — Other Ambulatory Visit: Payer: Self-pay | Admitting: Urology

## 2019-09-14 ENCOUNTER — Encounter (HOSPITAL_BASED_OUTPATIENT_CLINIC_OR_DEPARTMENT_OTHER): Payer: Self-pay | Admitting: Urology

## 2019-09-14 ENCOUNTER — Other Ambulatory Visit: Payer: Self-pay

## 2019-09-14 NOTE — Progress Notes (Addendum)
Spoke w/ via phone for pre-op interview---daughter Advertising account planner Lab needs dos----    I stat 8          Lab results------ COVID test ------09-17-2019 Arrive at -------1015 09-20-2019 NPO after ------midnight Medications to take morning of surgery -----none Diabetic medication -----n/a Patient Special Instructions ----- Pre-Op special Istructions ----- Patient verbalized understanding of instructions that were given at this phone interview. Patient denies shortness of breath, chest pain, fever, cough a this phone interview.  Anesthesia : hx of pe, blood thinner, thoracic ascending aneurysm  PCP: dr Velna Hatchet (prescribes eliquis) lov dr Cyndia Bent vascular 07-18-2019 chart/epic Cardiologist :none Chest ct 07-16-2019 chart/epic EKG :none Echo :04-06-2018 chart/epic Cardiac Cath : none Sleep Study/ CPAP :none Fasting Blood Sugar :      / Checks Blood Sugar -- times a day:  n/a Blood Thinner/ Instructions /Last Dose:eliquis, called selita bradsher at [facility] urology and left message: patient needs instructions on eliquis for 09-20-2019 surgery and to please fax eliquis instructions to [phone]. ASA / Instructions/ Last Dose : n/a  Patient denies shortness of breath, chest pain, fever, and cough at this phone interview.

## 2019-09-17 ENCOUNTER — Other Ambulatory Visit (HOSPITAL_COMMUNITY)
Admission: RE | Admit: 2019-09-17 | Discharge: 2019-09-17 | Disposition: A | Discharge status: Home / Self Care | Payer: Medicare HMO | Source: Ambulatory Visit | Attending: Urology | Admitting: Urology

## 2019-09-17 DIAGNOSIS — Z20822 Contact with and (suspected) exposure to covid-19: Secondary | ICD-10-CM | POA: Insufficient documentation

## 2019-09-17 DIAGNOSIS — Z01812 Encounter for preprocedural laboratory examination: Secondary | ICD-10-CM | POA: Insufficient documentation

## 2019-09-17 LAB — SARS CORONAVIRUS 2 (TAT 6-24 HRS): SARS Coronavirus 2: NEGATIVE

## 2019-09-18 NOTE — Progress Notes (Signed)
Requested note to stop eliquis from selits bradsher at [facility] urology, and to fax note to [phone].

## 2019-09-20 ENCOUNTER — Ambulatory Visit (HOSPITAL_BASED_OUTPATIENT_CLINIC_OR_DEPARTMENT_OTHER)
Admission: RE | Admit: 2019-09-20 | Discharge: 2019-09-20 | Disposition: A | Discharge status: Home / Self Care | Payer: Medicare HMO | Attending: Urology | Admitting: Urology

## 2019-09-20 ENCOUNTER — Ambulatory Visit (HOSPITAL_BASED_OUTPATIENT_CLINIC_OR_DEPARTMENT_OTHER): Payer: Medicare HMO | Admitting: Physician Assistant

## 2019-09-20 ENCOUNTER — Encounter (HOSPITAL_BASED_OUTPATIENT_CLINIC_OR_DEPARTMENT_OTHER): Payer: Self-pay | Admitting: Urology

## 2019-09-20 ENCOUNTER — Encounter (HOSPITAL_BASED_OUTPATIENT_CLINIC_OR_DEPARTMENT_OTHER)
Admission: RE | Disposition: A | Discharge status: Home / Self Care | Payer: Self-pay | Source: Home / Self Care | Attending: Urology

## 2019-09-20 DIAGNOSIS — C679 Malignant neoplasm of bladder, unspecified: Secondary | ICD-10-CM | POA: Diagnosis not present

## 2019-09-20 DIAGNOSIS — Z86718 Personal history of other venous thrombosis and embolism: Secondary | ICD-10-CM | POA: Insufficient documentation

## 2019-09-20 DIAGNOSIS — Z87891 Personal history of nicotine dependence: Secondary | ICD-10-CM | POA: Diagnosis not present

## 2019-09-20 DIAGNOSIS — M1990 Unspecified osteoarthritis, unspecified site: Secondary | ICD-10-CM | POA: Diagnosis not present

## 2019-09-20 DIAGNOSIS — Z20822 Contact with and (suspected) exposure to covid-19: Secondary | ICD-10-CM | POA: Diagnosis not present

## 2019-09-20 DIAGNOSIS — I440 Atrioventricular block, first degree: Secondary | ICD-10-CM | POA: Diagnosis not present

## 2019-09-20 DIAGNOSIS — Z8546 Personal history of malignant neoplasm of prostate: Secondary | ICD-10-CM | POA: Insufficient documentation

## 2019-09-20 DIAGNOSIS — Z8249 Family history of ischemic heart disease and other diseases of the circulatory system: Secondary | ICD-10-CM | POA: Insufficient documentation

## 2019-09-20 DIAGNOSIS — D494 Neoplasm of unspecified behavior of bladder: Secondary | ICD-10-CM | POA: Diagnosis not present

## 2019-09-20 DIAGNOSIS — I712 Thoracic aortic aneurysm, without rupture: Secondary | ICD-10-CM | POA: Diagnosis not present

## 2019-09-20 DIAGNOSIS — Z86711 Personal history of pulmonary embolism: Secondary | ICD-10-CM | POA: Insufficient documentation

## 2019-09-20 DIAGNOSIS — I2609 Other pulmonary embolism with acute cor pulmonale: Secondary | ICD-10-CM | POA: Diagnosis not present

## 2019-09-20 HISTORY — PX: TRANSURETHRAL RESECTION OF BLADDER TUMOR: SHX2575

## 2019-09-20 HISTORY — PX: CYSTOSCOPY W/ RETROGRADES: SHX1426

## 2019-09-20 LAB — POCT I-STAT, CHEM 8
BUN: 16 mg/dL (ref 8–23)
Calcium, Ion: 1.33 mmol/L (ref 1.15–1.40)
Chloride: 101 mmol/L (ref 98–111)
Creatinine, Ser: 1 mg/dL (ref 0.61–1.24)
Glucose, Bld: 82 mg/dL (ref 70–99)
HCT: 45 % (ref 39.0–52.0)
Hemoglobin: 15.3 g/dL (ref 13.0–17.0)
Potassium: 4.1 mmol/L (ref 3.5–5.1)
Sodium: 142 mmol/L (ref 135–145)
TCO2: 30 mmol/L (ref 22–32)

## 2019-09-20 SURGERY — TURBT (TRANSURETHRAL RESECTION OF BLADDER TUMOR)
Anesthesia: General

## 2019-09-20 MED ORDER — ONDANSETRON HCL 4 MG/2ML IJ SOLN
4.0000 mg | Freq: Once | INTRAMUSCULAR | Status: DC | PRN
  Filled 2019-09-20: qty 2

## 2019-09-20 MED ORDER — PROPOFOL 10 MG/ML IV BOLUS
INTRAVENOUS | Status: AC
  Filled 2019-09-20: qty 20

## 2019-09-20 MED ORDER — GLYCOPYRROLATE PF 0.2 MG/ML IJ SOSY
PREFILLED_SYRINGE | INTRAMUSCULAR | Status: AC
  Filled 2019-09-20: qty 1

## 2019-09-20 MED ORDER — ACETAMINOPHEN 160 MG/5ML PO SOLN
325.0000 mg | ORAL | Status: DC | PRN
  Filled 2019-09-20: qty 20.3

## 2019-09-20 MED ORDER — ONDANSETRON HCL 4 MG/2ML IJ SOLN
INTRAMUSCULAR | Status: DC | PRN
  Administered 2019-09-20: 4 mg via INTRAVENOUS

## 2019-09-20 MED ORDER — PROPOFOL 10 MG/ML IV BOLUS
INTRAVENOUS | Status: DC | PRN
  Administered 2019-09-20: 130 mg via INTRAVENOUS

## 2019-09-20 MED ORDER — ONDANSETRON HCL 4 MG/2ML IJ SOLN
INTRAMUSCULAR | Status: AC
  Filled 2019-09-20: qty 2

## 2019-09-20 MED ORDER — OXYCODONE HCL 5 MG PO TABS
5.0000 mg | ORAL_TABLET | Freq: Once | ORAL | Status: DC | PRN
  Filled 2019-09-20: qty 1

## 2019-09-20 MED ORDER — LIDOCAINE 2% (20 MG/ML) 5 ML SYRINGE
INTRAMUSCULAR | Status: AC
  Filled 2019-09-20: qty 5

## 2019-09-20 MED ORDER — ACETAMINOPHEN 325 MG PO TABS
325.0000 mg | ORAL_TABLET | ORAL | Status: DC | PRN
  Filled 2019-09-20: qty 2

## 2019-09-20 MED ORDER — OXYCODONE HCL 5 MG/5ML PO SOLN
5.0000 mg | Freq: Once | ORAL | Status: DC | PRN
  Filled 2019-09-20: qty 5

## 2019-09-20 MED ORDER — TRAMADOL HCL 50 MG PO TABS: 50.0000 mg | ORAL_TABLET | Freq: Four times a day (QID) | ORAL | 0 refills | Status: DC | PRN

## 2019-09-20 MED ORDER — MEPERIDINE HCL 25 MG/ML IJ SOLN
6.2500 mg | INTRAMUSCULAR | Status: DC | PRN
  Filled 2019-09-20: qty 1

## 2019-09-20 MED ORDER — FENTANYL CITRATE (PF) 100 MCG/2ML IJ SOLN
INTRAMUSCULAR | Status: AC
  Filled 2019-09-20: qty 2

## 2019-09-20 MED ORDER — FENTANYL CITRATE (PF) 100 MCG/2ML IJ SOLN
25.0000 ug | INTRAMUSCULAR | Status: DC | PRN
  Filled 2019-09-20: qty 1

## 2019-09-20 MED ORDER — DEXAMETHASONE SODIUM PHOSPHATE 10 MG/ML IJ SOLN
INTRAMUSCULAR | Status: DC | PRN
  Administered 2019-09-20: 10 mg via INTRAVENOUS

## 2019-09-20 MED ORDER — CEFAZOLIN SODIUM-DEXTROSE 2-4 GM/100ML-% IV SOLN
INTRAVENOUS | Status: AC
  Filled 2019-09-20: qty 100

## 2019-09-20 MED ORDER — CEFAZOLIN SODIUM-DEXTROSE 2-4 GM/100ML-% IV SOLN
2.0000 g | INTRAVENOUS | Status: DC
  Filled 2019-09-20: qty 100

## 2019-09-20 MED ORDER — ACETAMINOPHEN 10 MG/ML IV SOLN
1000.0000 mg | Freq: Once | INTRAVENOUS | Status: DC | PRN
  Filled 2019-09-20: qty 100

## 2019-09-20 MED ORDER — LIDOCAINE 2% (20 MG/ML) 5 ML SYRINGE
INTRAMUSCULAR | Status: DC | PRN
  Administered 2019-09-20: 60 mg via INTRAVENOUS

## 2019-09-20 MED ORDER — SUGAMMADEX SODIUM 200 MG/2ML IV SOLN
INTRAVENOUS | Status: DC | PRN
  Administered 2019-09-20: 200 mg via INTRAVENOUS

## 2019-09-20 MED ORDER — ROCURONIUM BROMIDE 10 MG/ML (PF) SYRINGE
PREFILLED_SYRINGE | INTRAVENOUS | Status: DC | PRN
  Administered 2019-09-20: 50 mg via INTRAVENOUS

## 2019-09-20 MED ORDER — GLYCOPYRROLATE 0.2 MG/ML IJ SOLN
INTRAMUSCULAR | Status: DC | PRN
  Administered 2019-09-20: .1 mg via INTRAVENOUS

## 2019-09-20 MED ORDER — FENTANYL CITRATE (PF) 100 MCG/2ML IJ SOLN
INTRAMUSCULAR | Status: DC | PRN
  Administered 2019-09-20: 50 ug via INTRAVENOUS

## 2019-09-20 MED ORDER — LACTATED RINGERS IV SOLN
INTRAVENOUS | Status: DC
  Filled 2019-09-20: qty 1000

## 2019-09-20 MED ORDER — IOPAMIDOL (ISOVUE-370) INJECTION 76%
INTRAVENOUS | Status: DC | PRN
  Administered 2019-09-20: 5 mL

## 2019-09-20 MED ORDER — DEXAMETHASONE SODIUM PHOSPHATE 10 MG/ML IJ SOLN
INTRAMUSCULAR | Status: AC
  Filled 2019-09-20: qty 1

## 2019-09-20 SURGICAL SUPPLY — 27 items
BAG DRAIN URO-CYSTO SKYTR STRL (DRAIN) ×8 IMPLANT
BAG URINE DRAIN 2000ML AR STRL (UROLOGICAL SUPPLIES) ×4 IMPLANT
BAG URINE LEG 500ML (DRAIN) IMPLANT
CATH FOLEY 3WAY 30CC 22F (CATHETERS) ×4 IMPLANT
CATH INTERMIT  6FR 70CM (CATHETERS) ×4 IMPLANT
CLOTH BEACON ORANGE TIMEOUT ST (SAFETY) ×4 IMPLANT
ELECT REM PT RETURN 9FT ADLT (ELECTROSURGICAL)
ELECTRODE REM PT RTRN 9FT ADLT (ELECTROSURGICAL) IMPLANT
GLOVE BIO SURGEON STRL SZ8 (GLOVE) ×4 IMPLANT
GOWN STRL REUS W/TWL XL LVL3 (GOWN DISPOSABLE) ×4 IMPLANT
GUIDEWIRE STR DUAL SENSOR (WIRE) ×4 IMPLANT
GUIDEWIRE ZIPWRE .038 STRAIGHT (WIRE) ×4 IMPLANT
IV NS IRRIG 3000ML ARTHROMATIC (IV SOLUTION) ×4 IMPLANT
KIT TURNOVER CYSTO (KITS) ×4 IMPLANT
LOOP CUT BIPOLAR 24F LRG (ELECTROSURGICAL) ×4 IMPLANT
MANIFOLD NEPTUNE II (INSTRUMENTS) ×4 IMPLANT
NS IRRIG 500ML POUR BTL (IV SOLUTION) ×4 IMPLANT
PACK CYSTO (CUSTOM PROCEDURE TRAY) ×4 IMPLANT
PLUG CATH AND CAP STER (CATHETERS) IMPLANT
STENT URET 6FRX26 CONTOUR (STENTS) ×4 IMPLANT
SYR 10ML LL (SYRINGE) ×4 IMPLANT
SYR 30ML LL (SYRINGE) ×4 IMPLANT
SYR TOOMEY IRRIG 70ML (MISCELLANEOUS) ×4
SYRINGE TOOMEY IRRIG 70ML (MISCELLANEOUS) ×2 IMPLANT
TUBE CONNECTING 12'X1/4 (SUCTIONS) ×1
TUBE CONNECTING 12X1/4 (SUCTIONS) ×3 IMPLANT
TUBING UROLOGY SET (TUBING) ×4 IMPLANT

## 2019-09-20 NOTE — Anesthesia Preprocedure Evaluation (Addendum)
Anesthesia Evaluation  Patient identified by MRN, date of birth, ID band Patient awake    Reviewed: Allergy & Precautions, NPO status , Patient's Chart, lab work & pertinent test results  Airway Mallampati: II       Dental  (+) Poor Dentition, Missing,    Pulmonary neg pulmonary ROS, former smoker,    Pulmonary exam normal        Cardiovascular Normal cardiovascular exam+ dysrhythmias  Rhythm:Regular Rate:Normal     Neuro/Psych negative psych ROS   GI/Hepatic negative GI ROS, Neg liver ROS,   Endo/Other  negative endocrine ROS  Renal/GU negative Renal ROS     Musculoskeletal   Abdominal Normal abdominal exam  (+)   Peds  Hematology negative hematology ROS (+)   Anesthesia Other Findings Conclusions   - Left ventricle: The cavity size was normal. Wall thickness was  increased in a pattern of moderate LVH. Systolic function was  normal. The estimated ejection fraction was in the range of 60%  to 65%. Wall motion was normal; there were no regional wall  motion abnormalities. Doppler parameters are consistent with  abnormal left ventricular relaxation (grade 1 diastolic  dysfunction).  - Aortic valve: There was mild regurgitation.  - Aortic root: The aortic root was mildly dilated.  - Left atrium: The atrium was moderately dilated.   -------------------------------------------------------------------  Study data: Comparison was made to the study of 12/03/2016. Study  status: Routine. Procedure: The patient reported no pain pre or  post test. Transthoracic echocardiography. Image quality was  adequate. Study completion: There were no complications.  Transthoracic echocardiography. M-mode, complete 2D, spectral  Doppler, and color Doppler. Birthdate: Patient birthdate:  01/28/1934. Age: Patient is 86 yr old. Sex: Gender: male.  BMI: 25.9 kg/m^2. Blood pressure:   114/59 Patient  status:  Inpatient. Study date: Study date: 04/06/2018. Study time: 08:31  AM. Location: Echo laboratory.   -------------------------------------------------------------------   -------------------------------------------------------------------  Left ventricle: The cavity size was normal. Wall thickness was  increased in a pattern of moderate LVH. Systolic function was  normal. The estimated ejection fraction was in the range of 60% to  65%. Wall motion was normal; there were no regional wall motion  abnormalities. Doppler parameters are consistent with abnormal left  ventricular relaxation (grade 1 diastolic dysfunction).   -------------------------------------------------------------------  Aortic valve:  Trileaflet; normal thickness, moderately calcified  leaflets. Mobility was not restricted. Doppler: Transvalvular  velocity was within the normal range. There was no stenosis. There  was mild regurgitation.   -------------------------------------------------------------------  Aorta: Aortic root: The aortic root was mildly dilated.   -------------------------------------------------------------------  Mitral valve:  Structurally normal valve.  Mobility was not  restricted. Doppler: Transvalvular velocity was within the normal  range. There was no evidence for stenosis. There was no  regurgitation.   -------------------------------------------------------------------  Left atrium: The atrium was moderately dilated.   -------------------------------------------------------------------  Right ventricle: The cavity size was normal. Wall thickness was  normal. Systolic function was normal.   -------------------------------------------------------------------  Pulmonic valve:  Not well visualized. Doppler: Transvalvular  velocity was within the normal range. There was no evidence for  stenosis.    -------------------------------------------------------------------  Tricuspid valve:  Structurally normal valve.  Doppler:  Transvalvular velocity was within the normal range. There was no  regurgitation.   -------------------------------------------------------------------  Pulmonary artery:  The main pulmonary artery was normal-sized.  Systolic pressure was within the normal range.   -------------------------------------------------------------------  Right atrium: The atrium was normal in size.   -------------------------------------------------------------------  Pericardium: There was  no pericardial effusion.   -------------------------------------------------------------------  Systemic veins:  Inferior vena cava: Poorly visualized.   -------------------------------------------------------------------  Measurements   Left ventricle              Value    Reference  LV ID, ED, PLAX chordal    (L)    34  mm   43 - 52  LV ID, ES, PLAX chordal         25  mm   23 - 38  LV fx shortening, PLAX chordal   Reproductive/Obstetrics                            Anesthesia Physical Anesthesia Plan  ASA: II  Anesthesia Plan: General   Post-op Pain Management:    Induction: Intravenous  PONV Risk Score and Plan: 3 and Ondansetron  Airway Management Planned: Oral ETT  Additional Equipment: None  Intra-op Plan:   Post-operative Plan: Extubation in OR  Informed Consent: I have reviewed the patients History and Physical, chart, labs and discussed the procedure including the risks, benefits and alternatives for the proposed anesthesia with the patient or authorized representative who has indicated his/her understanding and acceptance.     Dental advisory given  Plan Discussed with: CRNA  Anesthesia Plan Comments:         Anesthesia Quick Evaluation

## 2019-09-20 NOTE — Anesthesia Procedure Notes (Signed)
Procedure Name: Intubation Date/Time: 09/20/2019 12:33 PM Performed by: Gerald Leitz, CRNA Pre-anesthesia Checklist: Patient identified, Patient being monitored, Timeout performed, Emergency Drugs available and Suction available Patient Re-evaluated:Patient Re-evaluated prior to induction Oxygen Delivery Method: Circle system utilized Preoxygenation: Pre-oxygenation with 100% oxygen Induction Type: IV induction Ventilation: Mask ventilation without difficulty Laryngoscope Size: Mac and 3 Grade View: Grade I Tube type: Oral Tube size: 7.5 mm Number of attempts: 1 Airway Equipment and Method: Stylet and Video-laryngoscopy Placement Confirmation: ETT inserted through vocal cords under direct vision,  positive ETCO2 and breath sounds checked- equal and bilateral Secured at: 25 cm Tube secured with: Tape Dental Injury: Teeth and Oropharynx as per pre-operative assessment  Difficulty Due To: Difficult Airway- due to anterior larynx and Difficult Airway- due to dentition

## 2019-09-20 NOTE — Transfer of Care (Signed)
Immediate Anesthesia Transfer of Care Note  Patient: Connor Morris  Procedure(s) Performed: Procedure(s) with comments: TRANSURETHRAL RESECTION OF BLADDER TUMOR (TURBT) (N/A) - 1 HR CYSTOSCOPY WITH RETROGRADE PYELOGRAM (Bilateral)  Patient Location: PACU  Anesthesia Type:General  Level of Consciousness: Alert, Awake, Oriented  Airway & Oxygen Therapy: Patient Spontanous Breathing  Post-op Assessment: Report given to RN  Post vital signs: Reviewed and stable  Last Vitals:  Vitals:   09/20/19 1034 09/20/19 1328  BP: 122/65 (!) 189/59  Pulse: 65 (!) 55  Resp: 16 14  Temp: 36.4 C (!) 36.4 C  SpO2: [id number]    Complications: No apparent anesthesia complications

## 2019-09-20 NOTE — H&P (Signed)
Urology Admission H&P  Chief Complaint:  Gross hematuria  History of Present Illness: [name] is a 86yo with gross hematuria who was found to have a bladder tumor on office cystoscopy. He has mild LUTS. No fevers. Hx of prostate cancer   Past Medical History:  Diagnosis Date  . 1st degree AV block 09/14/2016  . Aortic aneurysm [facility])    sees dr bartle 4.6 x 4.5 cm stable per 07-16-2019 chest ct  . Arthritis   . Balanitis   . Dizziness 09/14/2016   resolved  . DVT (deep venous thrombosis) ([place]) 03/2018  . Prostate cancer ([place]) 2003   seed implants  . Pulmonary embolism ([place]) 03/2018  . Sinus bradycardia 09/16/2016   Past Surgical History:  Procedure Laterality Date  . FOOT SURGERY  yrs ago    Home Medications:  Current Facility-Administered Medications  Medication Dose Route Frequency Provider Last Rate Last Admin  . ceFAZolin (ANCEF) IVPB 2g/100 mL premix  2 g Intravenous 30 min Pre-Op Alyson Ingles Candee Furbish, MD      . lactated ringers infusion   Intravenous Continuous Lyn Hollingshead, MD 50 mL/hr at 09/20/19 1043 New Bag at 09/20/19 1043   Allergies: No Known Allergies  Family History  Problem Relation Age of Onset  . Diabetes Sister   . Hypertension Mother        family history  . Healthy Father   . Colon cancer Neg Hx    Social History:  reports that he quit smoking about 34 years ago. His smoking use included cigarettes. He has a 20.00 pack-year smoking history. He has never used smokeless tobacco. He reports that he does not drink alcohol or use drugs.  Review of Systems  Genitourinary: Positive for hematuria.  All other systems reviewed and are negative.   Physical Exam:  Vital signs in last 24 hours: Temp:  [97.6 F (36.4 C)] 97.6 F (36.4 C) (02/04 1034) Pulse Rate:  [65] 65 (02/04 1034) Resp:  [16] 16 (02/04 1034) BP: (122)/(65) 122/65 (02/04 1034) SpO2:  [98 %] 98 % (02/04 1034) Weight:  [72.3 kg] 72.3 kg (02/04 1034) Physical Exam  Constitutional:  He is oriented to person, place, and time. He appears well-developed and well-nourished.  HENT:  Head: Normocephalic and atraumatic.  Eyes: Pupils are equal, round, and reactive to light. EOM are normal.  Neck: No thyromegaly present.  Cardiovascular: Normal rate and regular rhythm.  Respiratory: Effort normal. No respiratory distress.  GI: Soft. He exhibits no distension.  Musculoskeletal:        General: No edema. Normal range of motion.     Cervical back: Normal range of motion.  Neurological: He is alert and oriented to person, place, and time.  Skin: Skin is warm and dry.  Psychiatric: He has a normal mood and affect. His behavior is normal. Judgment and thought content normal.    Laboratory Data:  Results for orders placed or performed during the hospital encounter of 09/20/19 (from the past 24 hour(s))  I-STAT, chem 8     Status: None   Collection Time: 09/20/19 10:39 AM  Result Value Ref Range   Sodium 142 135 - 145 mmol/L   Potassium 4.1 3.5 - 5.1 mmol/L   Chloride 101 98 - 111 mmol/L   BUN 16 8 - 23 mg/dL   Creatinine, Ser 1.00 0.61 - 1.24 mg/dL   Glucose, Bld 82 70 - 99 mg/dL   Calcium, Ion 1.33 1.15 - 1.40 mmol/L   TCO2 30 22 -  32 mmol/L   Hemoglobin 15.3 13.0 - 17.0 g/dL   HCT 45.0 39.0 - 52.0 %   Recent Results (from the past 240 hour(s))  SARS CORONAVIRUS 2 (TAT 6-24 HRS) Nasopharyngeal Nasopharyngeal Swab     Status: None   Collection Time: 09/17/19 10:12 AM   Specimen: Nasopharyngeal Swab  Result Value Ref Range Status   SARS Coronavirus 2 NEGATIVE NEGATIVE Final    Comment: (NOTE) SARS-CoV-2 target nucleic acids are NOT DETECTED. The SARS-CoV-2 RNA is generally detectable in upper and lower respiratory specimens during the acute phase of infection. Negative results do not preclude SARS-CoV-2 infection, do not rule out co-infections with other pathogens, and should not be used as the sole basis for treatment or other patient management decisions. Negative  results must be combined with clinical observations, patient history, and epidemiological information. The expected result is Negative. Fact Sheet for Patients: SugarRoll.be Fact Sheet for Healthcare Providers: https://www.woods-mathews.com/ This test is not yet approved or cleared by the [place] FDA and  has been authorized for detection and/or diagnosis of SARS-CoV-2 by FDA under an Emergency Use Authorization (EUA). This EUA will remain  in effect (meaning this test can be used) for the duration of the COVID-19 declaration under Section 56 4(b)(1) of the Act, 21 U.S.C. section 360bbb-3(b)(1), unless the authorization is terminated or revoked sooner. Performed at [facility], [place] [address] [place], [place] [address]    Creatinine: Recent Labs    09/20/19 1039  CREATININE 1.00   Baseline Creatinine: 1  Impression/Assessment:  86yo with bladder tumor  Plan:  The risks/benefits/alterantives to TURBT was explained to the patient and he understands and wishes to proceed with surgery  Nicolette Bang 09/20/2019, 12:19 PM

## 2019-09-20 NOTE — Discharge Instructions (Signed)
Indwelling Urinary Catheter Care, Adult An indwelling urinary catheter is a thin tube that is put into your bladder. The tube helps to drain pee (urine) out of your body. The tube goes in through your urethra. Your urethra is where pee comes out of your body. Your pee will come out through the catheter, then it will go into a bag (drainage bag). Take good care of your catheter so it will work well. How to wear your catheter and bag Supplies neede leg strap.  Alcohol wipe or soap and water (if you use tape).  Large overnight bag Wearing your catheter  Attach your catheter to your leg with  a leg strap.  Make sure the catheter is not pulled tight.  If a leg strap gets wet, take it off and put on a dry strap.. Wearing your bag  You should have been given a large overnight bag.  You may wear the overnight bag in the day or night.  Always have the overnight bag lower than your bladder.    Do not let the bag touch the floor.  Before you go to sleep, put a clean plastic bag in a wastebasket. Then hang the overnight bag inside the wastebasket.  How to care for your skin and catheter Supplies needed  A clean washcloth.  Water and mild soap.  A clean towel. Caring for your skin and catheter      Clean the skin around your catheter every day: 1. Wash your hands with soap and water. 2. Wet a clean washcloth in warm water and mild soap. 3. Clean the skin around your urethra.  If you are male:  Gently spread the folds of skin around your vagina (labia).  With the washcloth in your other hand, wipe the inner side of your labia on each side. Wipe from front to back.  If you are male:  Pull back any skin that covers the end of your penis (foreskin).  With the washcloth in your other hand, wipe your penis in small circles. Start wiping at the tip of your penis, then move away from the catheter.  Move the foreskin back in place, if needed. 4. With your free hand, hold the  catheter close to where it goes into your body.  Keep holding the catheter during cleaning so it does not get pulled out. 5. With the washcloth in your other hand, clean the catheter.  Only wipe downward on the catheter.  Do not wipe upward toward your body. Doing this may push germs into your urethra and cause infection. 6. Use a clean towel to pat-dry the catheter and the skin around it. Make sure to wipe off all soap. 7. Wash your hands with soap and water.  Shower every day.   Do not take baths.  Do not use cream, ointment, or lotion on the area where the catheter goes into your body, unless your doctor tells you to.  Do not use powders, sprays, or lotions on your genital area.  Check your skin around the catheter every day for signs of infection. Check for: ? Redness, swelling, or pain. ? Fluid or blood. ? Warmth. ? Pus or a bad smell. How to empty the bag   Emptying the bag Pour the pee out of your bag when it is ?- full, or at least 2-3 times a day.  1. Wash your hands with soap and water. 2. Separate (detach) the bag from your leg. 3. Hold the bag over the toilet  or a clean pail. Keep the bag lower than your hips and bladder. This is so the pee (urine) does not go back into the tube. 4. Open the pour spout. It is at the bottom of the bag. 5. Empty the pee into the toilet or pail. Do not let the pour spout touch any surface. 6. Put rubbing alcohol on a gauze pad or cotton ball. 7. Use the gauze pad or cotton ball to clean the pour spout. 8. Close the pour spout. 9. Attach the bag to your leg with tape or a leg strap. 10. Wash your hands with soap and water.   Changing the bag Replace your bag when it starts to leak, smell bad, or look dirty. 1. Wash your hands with soap and water. 2. Separate the dirty bag from your leg. 3. Pinch the catheter with your fingers so that pee does not spill out. 4. Separate the catheter tube from the bag tube where these tubes  connect (at the connection valve). Do not let the tubes touch any surface. 5. Clean the end of the catheter tube with an alcohol wipe. Use a different alcohol wipe to clean the end of the bag tube. 6. Connect the catheter tube to the tube of the clean bag. 7. Attach the clean bag to your leg with tape or a leg strap. Do not make the bag tight on your leg. 8. Wash your hands with soap and water. General rules   Never pull on your catheter. Never try to take it out. Doing that can hurt you.  Always wash your hands before and after you touch your catheter or bag. Use a mild, fragrance-free soap. If you do not have soap and water, use hand sanitizer.  Always make sure there are no twists or bends (kinks) in the catheter tube.  Always make sure there are no leaks in the catheter or bag.  Drink enough fluid to keep your pee pale yellow.  Do not take baths, swim, or use a hot tub.  If you are male, wipe from front to back after you poop (have a bowel movement). Contact a doctor if:  Your pee is cloudy.  Your pee smells worse than usual.  Your catheter gets clogged.  Your catheter leaks.  Your bladder feels full. Get help right away if:  You have redness, swelling, or pain where the catheter goes into your body.  You have fluid, blood, pus, or a bad smell coming from the area where the catheter goes into your body.  Your skin feels warm where the catheter goes into your body.  You have a fever.  You have pain in your: ? Belly (abdomen). ? Legs. ? Lower back. ? Bladder.  You see blood in the catheter.  Your pee is pink or red.  You feel sick to your stomach (nauseous).  You throw up (vomit).  You have chills.  Your pee is not draining into the bag.  Your catheter gets pulled out. Summary  An indwelling urinary catheter is a thin tube that is placed into the bladder to help drain pee (urine) out of the body.  The catheter is placed into the part of the body  that drains pee from the bladder (urethra).  Taking good care of your catheter will keep it working properly and help prevent problems.  Always wash your hands before and after touching your catheter or bag.  Never pull on your catheter or try to take it out. This  information is not intended to replace advice given to you by your health care provider. Make sure you discuss any questions you have with your health care provider. Document Revised: 11/24/2018 Document Reviewed: 03/18/2017 Elsevier Patient Education  [place] [facility] Instructions  Activity: Get plenty of rest for the remainder of the day. A responsible individual must stay with you for 24 hours following the procedure.  For the next 24 hours, DO NOT: -Drive a car -Paediatric nurse -Drink alcoholic beverages -Take any medication unless instructed by your physician -Make any legal decisions or sign important papers.  Meals: Start with liquid foods such as gelatin or soup. Progress to regular foods as tolerated. Avoid greasy, spicy, heavy foods. If nausea and/or vomiting occur, drink only clear liquids until the nausea and/or vomiting subsides. Call your physician if vomiting continues.  Special Instructions/Symptoms: Your throat may feel dry or sore from the anesthesia or the breathing tube placed in your throat during surgery. If this causes discomfort, gargle with warm salt water. The discomfort should disappear within 24 hours.

## 2019-09-20 NOTE — Op Note (Signed)
Preoperative diagnosis: Bladder Tumor  Postop diagnosis: Same  Procedure: 1.  Cystoscopy 2. Bilateral retrograde pyelography 3. Intra-operative fluoroscopy, under 1 hour, with interpretation 4.Transurethral resection of bladder tumor, medium 5. Right 6x26 JJ ureteral Stent Placement     Attending: Nicolette Bang  Anesthesia: General  Estimated blood loss: 5 cc  Drains: 1. 22 French Foley catheter 2. Right 6x26 JJ ureteral Stent  Specimens: Bladder tumor  Antibiotics: Ancef  Findings: 3cm sessile tumor involving right ureteral orifice  Indications: Patient is a 86 year old with a history of  bladder tumor found on office cystoscopy.   After discussing treatment options patient decided to proceed with transurethral resection of bladder tumor  Procedure in detail: Prior to procedure consetn was obtained. Patient was brought to the operating room and briefing was done sure correct patient, correct procedure, correct site.  General anesthesia was in administered patient was placed in the dorsal lithotomy position.  The rigid 67 French cystoscope was passed urethra and bladder.  Bladder was inspected masses or lesions and we a 3 cm lesion involving the right ureteral orifice. We then turned our attention to the left ureteral orifice.  The left ureteral orifice was cannulated with a 6 French ureteral catheter.  A gentle retrograde was obtained in findings noted above.     Removed the cystoscope and placed a 70 French resectoscope in the bladder.  Using bipolar electrocautery were then removed the 2 bladder tumors.  We removed the bladder tumors down to the base exposing muscle.  We then removed the pieces and sent them for pathology.  We were able to identify the right ureteral orifice at this time. We then cannulated the right ureteral orifice with a 6 French ureteral catheter. We then placed a zip wire through the ureteral catheter and advanced up to the renal pelvis.  A gentle retrograde  was obtained in findings noted above.We then placed a 6 x 26 double-J ureteral stent over the wire.  We then removed the wire and good coil was noted in the pelvis under fluoroscopy in the bladder under direct vision. The bladder was then drained and a 22 French Foley catheter was placed. This concluded the procedure which resulted by the patient.  Complications: None  Condition: Stable,  extubated, transferred to PACU.  Plan: The patient is to be discharged home and followup in 1 week for voiding trial and pathology discussion

## 2019-09-20 NOTE — Progress Notes (Signed)
NOTE RECEIVED AND PLACED ON CHART TO STOP XARELTO 72 HOURS PRIOR TO SURGERY  PER DR Towana Badger

## 2019-09-20 NOTE — Anesthesia Postprocedure Evaluation (Signed)
Anesthesia Post Note  Patient: Publishing copy  Procedure(s) Performed: TRANSURETHRAL RESECTION OF BLADDER TUMOR (TURBT) (N/A ) CYSTOSCOPY WITH RETROGRADE PYELOGRAM (Bilateral )     Patient location during evaluation: PACU Anesthesia Type: General Level of consciousness: sedated Pain management: pain level controlled Vital Signs Assessment: post-procedure vital signs reviewed and stable Respiratory status: spontaneous breathing Cardiovascular status: stable Postop Assessment: no apparent nausea or vomiting Anesthetic complications: no    Last Vitals:  Vitals:   09/20/19 1328 09/20/19 1330  BP: (!) 189/59 (!) 194/63  Pulse: (!) 55 (!) 59  Resp: 14 14  Temp: (!) 36.4 C   SpO2: 100% 100%    Last Pain:  Vitals:   09/20/19 1328  TempSrc:   PainSc: 0-No pain   Pain Goal:                   Huston Foley

## 2019-09-21 LAB — SURGICAL PATHOLOGY

## 2019-09-21 NOTE — Addendum Note (Signed)
Addendum  created 09/21/19 0853 by Suan Halter, CRNA   Charge Capture section accepted

## 2019-09-27 DIAGNOSIS — C672 Malignant neoplasm of lateral wall of bladder: Secondary | ICD-10-CM | POA: Diagnosis not present

## 2019-09-27 DIAGNOSIS — R310 Gross hematuria: Secondary | ICD-10-CM | POA: Diagnosis not present

## 2019-09-28 ENCOUNTER — Other Ambulatory Visit: Payer: Self-pay | Admitting: Urology

## 2019-10-18 ENCOUNTER — Other Ambulatory Visit: Payer: Self-pay | Admitting: Urology

## 2019-10-24 ENCOUNTER — Other Ambulatory Visit: Payer: Self-pay

## 2019-10-24 ENCOUNTER — Encounter (HOSPITAL_BASED_OUTPATIENT_CLINIC_OR_DEPARTMENT_OTHER): Payer: Self-pay | Admitting: Urology

## 2019-10-24 NOTE — Progress Notes (Addendum)
Spoke w/ via phone for pre-op interview---daughter Salem---- I stat 8             Lab results------ COVID test ------10-26-2019 Arrive at -------1030 am 10-30-2019 NPO after ------midnight Medications to take morning of surgery -----patient wishes to take xarelto after surgery Diabetic medication -----n/a Patient Special Instructions ----- Pre-Op special Istructions ----- Patient verbalized understanding of instructions that were given at this phone interview. Patient denies shortness of breath, chest pain, fever, cough a this phone interview.  Anesthesia : hx of pe. Blood thinner thoracic ascending aneurysm Chart to jessica zanetto pa for review, Addendum spoke to [place] zanetto pa ok to proceed.  PCP:dr scott holwerda Cardiologist :none Chest ct 07-16-2019 epic EKG :09-20-2019 epic Echo :none Cardiac Cath : none Sleep Study/ CPAP :none Fasting Blood Sugar :      / Checks Blood Sugar -- times a day:  n/a Blood Thinner/ Instructions /Last Dose:patient to remain on eliquis per dr Alyson Ingles ASA / Instructions/ Last Dose : n/a  Patient denies shortness of breath, chest pain, fever, and cough at this phone interview.

## 2019-10-26 ENCOUNTER — Other Ambulatory Visit (HOSPITAL_COMMUNITY)
Admission: RE | Admit: 2019-10-26 | Discharge: 2019-10-26 | Disposition: A | Discharge status: Home / Self Care | Payer: Medicare HMO | Source: Ambulatory Visit | Attending: Urology | Admitting: Urology

## 2019-10-26 DIAGNOSIS — Z20822 Contact with and (suspected) exposure to covid-19: Secondary | ICD-10-CM | POA: Insufficient documentation

## 2019-10-26 DIAGNOSIS — Z01812 Encounter for preprocedural laboratory examination: Secondary | ICD-10-CM | POA: Insufficient documentation

## 2019-10-26 LAB — SARS CORONAVIRUS 2 (TAT 6-24 HRS): SARS Coronavirus 2: NEGATIVE

## 2019-10-30 ENCOUNTER — Ambulatory Visit (HOSPITAL_BASED_OUTPATIENT_CLINIC_OR_DEPARTMENT_OTHER): Payer: Medicare HMO | Admitting: Physician Assistant

## 2019-10-30 ENCOUNTER — Encounter (HOSPITAL_BASED_OUTPATIENT_CLINIC_OR_DEPARTMENT_OTHER): Payer: Self-pay | Admitting: Urology

## 2019-10-30 ENCOUNTER — Ambulatory Visit (HOSPITAL_BASED_OUTPATIENT_CLINIC_OR_DEPARTMENT_OTHER)
Admission: RE | Admit: 2019-10-30 | Discharge: 2019-10-30 | Disposition: A | Discharge status: Home / Self Care | Payer: Medicare HMO | Attending: Urology | Admitting: Urology

## 2019-10-30 ENCOUNTER — Encounter (HOSPITAL_BASED_OUTPATIENT_CLINIC_OR_DEPARTMENT_OTHER)
Admission: RE | Disposition: A | Discharge status: Home / Self Care | Payer: Self-pay | Source: Home / Self Care | Attending: Urology

## 2019-10-30 DIAGNOSIS — Z87891 Personal history of nicotine dependence: Secondary | ICD-10-CM | POA: Insufficient documentation

## 2019-10-30 DIAGNOSIS — N3080 Other cystitis without hematuria: Secondary | ICD-10-CM | POA: Insufficient documentation

## 2019-10-30 DIAGNOSIS — Z86718 Personal history of other venous thrombosis and embolism: Secondary | ICD-10-CM | POA: Diagnosis not present

## 2019-10-30 DIAGNOSIS — Z86711 Personal history of pulmonary embolism: Secondary | ICD-10-CM | POA: Insufficient documentation

## 2019-10-30 DIAGNOSIS — N3020 Other chronic cystitis without hematuria: Secondary | ICD-10-CM | POA: Diagnosis not present

## 2019-10-30 DIAGNOSIS — Z8546 Personal history of malignant neoplasm of prostate: Secondary | ICD-10-CM | POA: Insufficient documentation

## 2019-10-30 DIAGNOSIS — D494 Neoplasm of unspecified behavior of bladder: Secondary | ICD-10-CM | POA: Diagnosis not present

## 2019-10-30 DIAGNOSIS — I712 Thoracic aortic aneurysm, without rupture: Secondary | ICD-10-CM | POA: Diagnosis not present

## 2019-10-30 DIAGNOSIS — K449 Diaphragmatic hernia without obstruction or gangrene: Secondary | ICD-10-CM | POA: Diagnosis not present

## 2019-10-30 DIAGNOSIS — Z8551 Personal history of malignant neoplasm of bladder: Secondary | ICD-10-CM | POA: Insufficient documentation

## 2019-10-30 DIAGNOSIS — J449 Chronic obstructive pulmonary disease, unspecified: Secondary | ICD-10-CM | POA: Diagnosis not present

## 2019-10-30 HISTORY — PX: TRANSURETHRAL RESECTION OF BLADDER TUMOR: SHX2575

## 2019-10-30 HISTORY — PX: CYSTOSCOPY: SHX5120

## 2019-10-30 LAB — POCT I-STAT, CHEM 8
BUN: 12 mg/dL (ref 8–23)
Calcium, Ion: 1.34 mmol/L (ref 1.15–1.40)
Chloride: 102 mmol/L (ref 98–111)
Creatinine, Ser: 1 mg/dL (ref 0.61–1.24)
Glucose, Bld: 79 mg/dL (ref 70–99)
HCT: 41 % (ref 39.0–52.0)
Hemoglobin: 13.9 g/dL (ref 13.0–17.0)
Potassium: 4.2 mmol/L (ref 3.5–5.1)
Sodium: 139 mmol/L (ref 135–145)
TCO2: 28 mmol/L (ref 22–32)

## 2019-10-30 SURGERY — TURBT (TRANSURETHRAL RESECTION OF BLADDER TUMOR)
Anesthesia: General | Site: Bladder

## 2019-10-30 MED ORDER — PROPOFOL 10 MG/ML IV BOLUS
INTRAVENOUS | Status: AC
  Filled 2019-10-30: qty 20

## 2019-10-30 MED ORDER — LIDOCAINE 2% (20 MG/ML) 5 ML SYRINGE
INTRAMUSCULAR | Status: DC | PRN
  Administered 2019-10-30: 100 mg via INTRAVENOUS

## 2019-10-30 MED ORDER — TRAMADOL HCL 50 MG PO TABS: 50.0000 mg | ORAL_TABLET | Freq: Four times a day (QID) | ORAL | 0 refills | Status: AC | PRN

## 2019-10-30 MED ORDER — FENTANYL CITRATE (PF) 100 MCG/2ML IJ SOLN
25.0000 ug | INTRAMUSCULAR | Status: DC | PRN
  Filled 2019-10-30: qty 1

## 2019-10-30 MED ORDER — LACTATED RINGERS IV SOLN
INTRAVENOUS | Status: DC
  Filled 2019-10-30: qty 1000

## 2019-10-30 MED ORDER — ONDANSETRON HCL 4 MG/2ML IJ SOLN
4.0000 mg | Freq: Once | INTRAMUSCULAR | Status: DC | PRN
  Filled 2019-10-30: qty 2

## 2019-10-30 MED ORDER — ONDANSETRON HCL 4 MG/2ML IJ SOLN
INTRAMUSCULAR | Status: DC | PRN
  Administered 2019-10-30: 4 mg via INTRAVENOUS

## 2019-10-30 MED ORDER — FENTANYL CITRATE (PF) 100 MCG/2ML IJ SOLN
INTRAMUSCULAR | Status: DC | PRN
  Administered 2019-10-30 (×2): 25 ug via INTRAVENOUS

## 2019-10-30 MED ORDER — OXYCODONE HCL 5 MG/5ML PO SOLN
5.0000 mg | Freq: Once | ORAL | Status: DC | PRN
  Filled 2019-10-30: qty 5

## 2019-10-30 MED ORDER — LIDOCAINE 2% (20 MG/ML) 5 ML SYRINGE
INTRAMUSCULAR | Status: AC
  Filled 2019-10-30: qty 5

## 2019-10-30 MED ORDER — CEFAZOLIN SODIUM-DEXTROSE 2-4 GM/100ML-% IV SOLN
INTRAVENOUS | Status: AC
  Filled 2019-10-30: qty 100

## 2019-10-30 MED ORDER — ONDANSETRON HCL 4 MG/2ML IJ SOLN
INTRAMUSCULAR | Status: AC
  Filled 2019-10-30: qty 2

## 2019-10-30 MED ORDER — SODIUM CHLORIDE 0.9 % IR SOLN
Status: DC | PRN
  Administered 2019-10-30: 3000 mL via INTRAVESICAL

## 2019-10-30 MED ORDER — CEFAZOLIN SODIUM-DEXTROSE 2-4 GM/100ML-% IV SOLN
2.0000 g | INTRAVENOUS | Status: AC
  Administered 2019-10-30: 2 g via INTRAVENOUS
  Filled 2019-10-30: qty 100

## 2019-10-30 MED ORDER — FENTANYL CITRATE (PF) 100 MCG/2ML IJ SOLN
INTRAMUSCULAR | Status: AC
  Filled 2019-10-30: qty 2

## 2019-10-30 MED ORDER — OXYCODONE HCL 5 MG PO TABS
5.0000 mg | ORAL_TABLET | Freq: Once | ORAL | Status: DC | PRN
  Filled 2019-10-30: qty 1

## 2019-10-30 MED ORDER — PROPOFOL 10 MG/ML IV BOLUS
INTRAVENOUS | Status: DC | PRN
  Administered 2019-10-30: 50 mg via INTRAVENOUS
  Administered 2019-10-30: 100 mg via INTRAVENOUS

## 2019-10-30 SURGICAL SUPPLY — 24 items
BAG DRAIN URO-CYSTO SKYTR STRL (DRAIN) ×3 IMPLANT
BAG URINE DRAIN 2000ML AR STRL (UROLOGICAL SUPPLIES) ×2 IMPLANT
BAG URINE LEG 500ML (DRAIN) IMPLANT
CATH FOLEY 3WAY 30CC 22F (CATHETERS) ×3 IMPLANT
CLOTH BEACON ORANGE TIMEOUT ST (SAFETY) ×3 IMPLANT
ELECT REM PT RETURN 9FT ADLT (ELECTROSURGICAL) ×3
ELECTRODE REM PT RTRN 9FT ADLT (ELECTROSURGICAL) ×1 IMPLANT
GLOVE BIO SURGEON STRL SZ 6.5 (GLOVE) ×1 IMPLANT
GLOVE BIO SURGEON STRL SZ8 (GLOVE) ×3 IMPLANT
GLOVE BIO SURGEONS STRL SZ 6.5 (GLOVE) ×1
GLOVE BIOGEL PI IND STRL 6.5 (GLOVE) IMPLANT
GLOVE BIOGEL PI INDICATOR 6.5 (GLOVE) ×4
GOWN STRL REUS W/TWL XL LVL3 (GOWN DISPOSABLE) ×3 IMPLANT
IV NS IRRIG 3000ML ARTHROMATIC (IV SOLUTION) ×3 IMPLANT
KIT TURNOVER CYSTO (KITS) ×3 IMPLANT
MANIFOLD NEPTUNE II (INSTRUMENTS) ×3 IMPLANT
PACK CYSTO (CUSTOM PROCEDURE TRAY) ×3 IMPLANT
PLUG CATH AND CAP STER (CATHETERS) ×2 IMPLANT
SYR 30ML LL (SYRINGE) ×3 IMPLANT
SYR TOOMEY IRRIG 70ML (MISCELLANEOUS)
SYRINGE TOOMEY IRRIG 70ML (MISCELLANEOUS) IMPLANT
TUBE CONNECTING 12'X1/4 (SUCTIONS) ×1
TUBE CONNECTING 12X1/4 (SUCTIONS) ×2 IMPLANT
TUBING UROLOGY SET (TUBING) ×3 IMPLANT

## 2019-10-30 NOTE — H&P (Signed)
Urology Admission H&P  Chief Complaint: bladder cancer  History of Present Illness: Connor Morris is an 86 yo with a hx of T1G3 bladder cancer here for repeat resection of his tumor. He had a right lateral wall tumor involving the right ureteral orifice and had a stent placed 1 month ago. He has moderate LUTS with the stent in place. No gross hematuria  Past Medical History:  Diagnosis Date  . 1st degree AV block 09/14/2016   hx of left anterior fasicular block on ekg  . Aortic aneurysm [facility])    sees dr bartle 4.6 x 4.5 cm stable per 07-16-2019 chest ct  . Arthritis   . Balanitis   . Bladder cancer ([place])   . Dizziness 09/14/2016   resolved  . DVT (deep venous thrombosis) ([place]) 03/2018  . Prostate cancer ([place]) 2003   seed implants  . Pulmonary embolism ([place]) 03/2018  . Sinus bradycardia 09/16/2016   Past Surgical History:  Procedure Laterality Date  . CYSTOSCOPY W/ RETROGRADES Bilateral 09/20/2019   Procedure: CYSTOSCOPY WITH RETROGRADE PYELOGRAM;  Surgeon: Cleon Gustin, MD;  Location: [facility];  Service: Urology;  Laterality: Bilateral;  . FOOT SURGERY  yrs ago  . TRANSURETHRAL RESECTION OF BLADDER TUMOR N/A 09/20/2019   Procedure: TRANSURETHRAL RESECTION OF BLADDER TUMOR (TURBT);  Surgeon: Cleon Gustin, MD;  Location: [facility];  Service: Urology;  Laterality: N/A;  1 HR    Home Medications:  Current Facility-Administered Medications  Medication Dose Route Frequency Provider Last Rate Last Admin  . ceFAZolin (ANCEF) IVPB 2g/100 mL premix  2 g Intravenous 30 min Pre-Op Alyson Ingles Candee Furbish, MD      . lactated ringers infusion   Intravenous Continuous Lyn Hollingshead, MD 50 mL/hr at 10/30/19 1122 New Bag at 10/30/19 1122   Allergies: No Known Allergies  Family History  Problem Relation Age of Onset  . Diabetes Sister   . Hypertension Mother        family history  . Healthy Father   . Colon cancer Neg Hx    Social History:  reports  that he quit smoking about 34 years ago. His smoking use included cigarettes. He has a 20.00 pack-year smoking history. He has never used smokeless tobacco. He reports that he does not drink alcohol or use drugs.  Review of Systems  All other systems reviewed and are negative.   Physical Exam:  Vital signs in last 24 hours: Temp:  [97.2 F (36.2 C)] 97.2 F (36.2 C) (03/16 1113) Pulse Rate:  [49] 49 (03/16 1113) Resp:  [16] 16 (03/16 1113) BP: (161)/(58) 161/58 (03/16 1113) SpO2:  [100 %] 100 % (03/16 1113) Weight:  [70.8 kg] 70.8 kg (03/16 1113) Physical Exam  Constitutional: He is oriented to person, place, and time. He appears well-developed and well-nourished.  HENT:  Head: Normocephalic and atraumatic.  Eyes: Pupils are equal, round, and reactive to light. EOM are normal.  Neck: No thyromegaly present.  Cardiovascular: Normal rate and regular rhythm.  Respiratory: Effort normal. No respiratory distress.  GI: Soft. He exhibits no distension.  Musculoskeletal:        General: No edema. Normal range of motion.     Cervical back: Normal range of motion.  Neurological: He is alert and oriented to person, place, and time.  Skin: Skin is warm and dry.  Psychiatric: He has a normal mood and affect. His behavior is normal. Judgment and thought content normal.    Laboratory Data:  Results for orders placed or performed during the hospital encounter of 10/30/19 (from the past 24 hour(s))  I-STAT, chem 8     Status: None   Collection Time: 10/30/19 11:21 AM  Result Value Ref Range   Sodium 139 135 - 145 mmol/L   Potassium 4.2 3.5 - 5.1 mmol/L   Chloride 102 98 - 111 mmol/L   BUN 12 8 - 23 mg/dL   Creatinine, Ser 1.00 0.61 - 1.24 mg/dL   Glucose, Bld 79 70 - 99 mg/dL   Calcium, Ion 1.34 1.15 - 1.40 mmol/L   TCO2 28 22 - 32 mmol/L   Hemoglobin 13.9 13.0 - 17.0 g/dL   HCT 41.0 39.0 - 52.0 %   Recent Results (from the past 240 hour(s))  SARS CORONAVIRUS 2 (TAT 6-24 HRS)  Nasopharyngeal Nasopharyngeal Swab     Status: None   Collection Time: 10/26/19  9:02 AM   Specimen: Nasopharyngeal Swab  Result Value Ref Range Status   SARS Coronavirus 2 NEGATIVE NEGATIVE Final    Comment: (NOTE) SARS-CoV-2 target nucleic acids are NOT DETECTED. The SARS-CoV-2 RNA is generally detectable in upper and lower respiratory specimens during the acute phase of infection. Negative results do not preclude SARS-CoV-2 infection, do not rule out co-infections with other pathogens, and should not be used as the sole basis for treatment or other patient management decisions. Negative results must be combined with clinical observations, patient history, and epidemiological information. The expected result is Negative. Fact Sheet for Patients: SugarRoll.be Fact Sheet for Healthcare Providers: https://www.woods-mathews.com/ This test is not yet approved or cleared by the [place] FDA and  has been authorized for detection and/or diagnosis of SARS-CoV-2 by FDA under an Emergency Use Authorization (EUA). This EUA will remain  in effect (meaning this test can be used) for the duration of the COVID-19 declaration under Section 56 4(b)(1) of the Act, 21 U.S.C. section 360bbb-3(b)(1), unless the authorization is terminated or revoked sooner. Performed at [facility], [place] [address] [place], [place] [address]    Creatinine: Recent Labs    10/30/19 1121  CREATININE 1.00   Baseline Creatinine: 1  Impression/Assessment:  86yo with bladder cancer  Plan:  The risks/benefits/alterantives to TURBT was explained to the patient and he understands and wishes to proceed with surgery  Nicolette Bang 10/30/2019, 11:48 AM

## 2019-10-30 NOTE — Anesthesia Preprocedure Evaluation (Addendum)
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  '19 TTE - Moderate LVH. EF 60% to 65%. Grade 1 diastolic dysfunction. Mild AI. The aortic root was mildly dilated. Left atrium was moderately dilated.     Neuro/Psych negative neurological ROS  negative psych ROS   GI/Hepatic Neg liver ROS, hiatal hernia,   Endo/Other  negative endocrine ROS  Renal/GU negative Renal ROS    Bladder cancer     Musculoskeletal  (+) Arthritis ,   Abdominal   Peds  Hematology negative hematology ROS (+)   Anesthesia Other Findings Covid neg 10/26/19  Reproductive/Obstetrics                            Anesthesia Physical Anesthesia Plan  ASA: III  Anesthesia Plan: General   Post-op Pain Management:    Induction: Intravenous  PONV Risk Score and Plan: 2 and Treatment may vary due to age or medical condition and Ondansetron  Airway Management Planned: LMA  Additional Equipment: None  Intra-op Plan:   Post-operative Plan: Extubation in OR  Informed Consent: I have reviewed the patients History and Physical, chart, labs and discussed the procedure including the risks, benefits and alternatives for the proposed anesthesia with the patient or authorized representative who has indicated his/her understanding  and acceptance.     Dental advisory given  Plan Discussed with: CRNA and Anesthesiologist  Anesthesia Plan Comments: ( )       Anesthesia Quick Evaluation

## 2019-10-30 NOTE — Discharge Instructions (Signed)
Indwelling Urinary Catheter Care, Adult An indwelling urinary catheter is a thin tube that is put into your bladder. The tube helps to drain pee (urine) out of your body. The tube goes in through your urethra. Your urethra is where pee comes out of your body. Your pee will come out through the catheter, then it will go into a bag (drainage bag). Take good care of your catheter so it will work well. How to wear your catheter and bag Supplies needed  Sticky tape (adhesive tape) or a leg strap.  Alcohol wipe or soap and water (if you use tape).  A clean towel (if you use tape).  Large overnight bag.  Smaller bag (leg bag). Wearing your catheter Attach your catheter to your leg with tape or a leg strap.  Make sure the catheter is not pulled tight.  If a leg strap gets wet, take it off and put on a dry strap.  If you use tape to hold the bag on your leg: 1. Use an alcohol wipe or soap and water to wash your skin where the tape made it sticky before. 2. Use a clean towel to pat-dry that skin. 3. Use new tape to make the bag stay on your leg. Wearing your bags You should have been given a large overnight bag.  You may wear the overnight bag in the day or night.  Always have the overnight bag lower than your bladder.  Do not let the bag touch the floor.  Before you go to sleep, put a clean plastic bag in a wastebasket. Then hang the overnight bag inside the wastebasket. You should also have a smaller leg bag that fits under your clothes.  Always wear the leg bag below your knee.  Do not wear your leg bag at night. How to care for your skin and catheter Supplies needed  A clean washcloth.  Water and mild soap.  A clean towel. Caring for your skin and catheter      Clean the skin around your catheter every day: 1. Wash your hands with soap and water. 2. Wet a clean washcloth in warm water and mild soap. 3. Clean the skin around your urethra.  If you are  male:  Gently spread the folds of skin around your vagina (labia).  With the washcloth in your other hand, wipe the inner side of your labia on each side. Wipe from front to back.  If you are male:  Pull back any skin that covers the end of your penis (foreskin).  With the washcloth in your other hand, wipe your penis in small circles. Start wiping at the tip of your penis, then move away from the catheter.  Move the foreskin back in place, if needed. 4. With your free hand, hold the catheter close to where it goes into your body.  Keep holding the catheter during cleaning so it does not get pulled out. 5. With the washcloth in your other hand, clean the catheter.  Only wipe downward on the catheter.  Do not wipe upward toward your body. Doing this may push germs into your urethra and cause infection. 6. Use a clean towel to pat-dry the catheter and the skin around it. Make sure to wipe off all soap. 7. Wash your hands with soap and water.  Shower every day. Do not take baths.  Do not use cream, ointment, or lotion on the area where the catheter goes into your body, unless your doctor tells you   to.  Do not use powders, sprays, or lotions on your genital area.  Check your skin around the catheter every day for signs of infection. Check for: ? Redness, swelling, or pain. ? Fluid or blood. ? Warmth. ? Pus or a bad smell. How to empty the bag Supplies needed  Rubbing alcohol.  Gauze pad or cotton ball.  Tape or a leg strap. Emptying the bag Pour the pee out of your bag when it is ?- full, or at least 2-3 times a day. Do this for your overnight bag and your leg bag. 1. Wash your hands with soap and water. 2. Separate (detach) the bag from your leg. 3. Hold the bag over the toilet or a clean pail. Keep the bag lower than your hips and bladder. This is so the pee (urine) does not go back into the tube. 4. Open the pour spout. It is at the bottom of the bag. 5. Empty the  pee into the toilet or pail. Do not let the pour spout touch any surface. 6. Put rubbing alcohol on a gauze pad or cotton ball. 7. Use the gauze pad or cotton ball to clean the pour spout. 8. Close the pour spout. 9. Attach the bag to your leg with tape or a leg strap. 10. Wash your hands with soap and water. Follow instructions for cleaning the drainage bag:  From the product maker.  As told by your doctor. How to change the bag Supplies needed  Alcohol wipes.  A clean bag.  Tape or a leg strap. Changing the bag Replace your bag when it starts to leak, smell bad, or look dirty. 1. Wash your hands with soap and water. 2. Separate the dirty bag from your leg. 3. Pinch the catheter with your fingers so that pee does not spill out. 4. Separate the catheter tube from the bag tube where these tubes connect (at the connection valve). Do not let the tubes touch any surface. 5. Clean the end of the catheter tube with an alcohol wipe. Use a different alcohol wipe to clean the end of the bag tube. 6. Connect the catheter tube to the tube of the clean bag. 7. Attach the clean bag to your leg with tape or a leg strap. Do not make the bag tight on your leg. 8. Wash your hands with soap and water. General rules   Never pull on your catheter. Never try to take it out. Doing that can hurt you.  Always wash your hands before and after you touch your catheter or bag. Use a mild, fragrance-free soap. If you do not have soap and water, use hand sanitizer.  Always make sure there are no twists or bends (kinks) in the catheter tube.  Always make sure there are no leaks in the catheter or bag.  Drink enough fluid to keep your pee pale yellow.  Do not take baths, swim, or use a hot tub.  If you are male, wipe from front to back after you poop (have a bowel movement). Contact a doctor if:  Your pee is cloudy.  Your pee smells worse than usual.  Your catheter gets clogged.  Your catheter  leaks.  Your bladder feels full. Get help right away if:  You have redness, swelling, or pain where the catheter goes into your body.  You have fluid, blood, pus, or a bad smell coming from the area where the catheter goes into your body.  Your skin feels warm where   where the catheter goes into your body.  You have a fever.  You have pain in your: ? Belly (abdomen). ? Legs. ? Lower back. ? Bladder.  You see blood in the catheter.  Your pee is pink or red.  You feel sick to your stomach (nauseous).  You throw up (vomit).  You have chills.  Your pee is not draining into the bag.  Your catheter gets pulled out. Summary  An indwelling urinary catheter is a thin tube that is placed into the bladder to help drain pee (urine) out of the body.  The catheter is placed into the part of the body that drains pee from the bladder (urethra).  Taking good care of your catheter will keep it working properly and help prevent problems.  Always wash your hands before and after touching your catheter or bag.  Never pull on your catheter or try to take it out. This information is not intended to replace advice given to you by your health care provider. Make sure you discuss any questions you have with your health care provider. Document Revised: 11/24/2018 Document Reviewed: 03/18/2017 [employer] Patient Education  2020 [employer]   Post Anesthesia Home Care Instructions  Activity: Get plenty of rest for the remainder of the day. A responsible adult should stay with you for 24 hours following the procedure.  For the next 24 hours, DO NOT: -Drive a car -Operate machinery -Drink alcoholic beverages -Take any medication unless instructed by your physician -Make any legal decisions or sign important papers.  Meals: Start with liquid foods such as gelatin or soup. Progress to regular foods as tolerated. Avoid greasy, spicy, heavy foods. If nausea and/or vomiting occur, drink only clear  liquids until the nausea and/or vomiting subsides. Call your physician if vomiting continues.  Special Instructions/Symptoms: Your throat may feel dry or sore from the anesthesia or the breathing tube placed in your throat during surgery. If this causes discomfort, gargle with warm salt water. The discomfort should disappear within 24 hours.  If you had a scopolamine patch placed behind your ear for the management of post- operative nausea and/or vomiting:  1. The medication in the patch is effective for 72 hours, after which it should be removed.  Wrap patch in a tissue and discard in the trash. Wash hands thoroughly with soap and water. 2. You may remove the patch earlier than 72 hours if you experience unpleasant side effects which may include dry mouth, dizziness or visual disturbances. 3. Avoid touching the patch. Wash your hands with soap and water after contact with the patch.    

## 2019-10-30 NOTE — Transfer of Care (Signed)
Immediate Anesthesia Transfer of Care Note  Patient: Connor Morris  Procedure(s) Performed: TRANSURETHRAL RESECTION OF BLADDER TUMOR (TURBT), REMOVAL OF RIGHT STENT (N/A Bladder) CYSTOSCOPY (N/A Bladder)  Patient Location: PACU  Anesthesia Type:General  Level of Consciousness: awake, drowsy and responds to stimulation  Airway & Oxygen Therapy: Patient Spontanous Breathing and Patient connected to nasal cannula oxygen  Post-op Assessment: Report given to RN and Post -op Vital signs reviewed and stable  Post vital signs: Reviewed and stable  Last Vitals:  Vitals Value Taken Time  BP 163/52 10/30/19 1255  Temp    Pulse 49 10/30/19 1258  Resp 11 10/30/19 1258  SpO2 100 % 10/30/19 1258  Vitals shown include unvalidated device data.  Last Pain:  Vitals:   10/30/19 1113  TempSrc: Oral  PainSc: 0-No pain      Patients Stated Pain Goal: 3 ([id number])  Complications: No apparent anesthesia complications

## 2019-10-30 NOTE — Op Note (Signed)
.  Preoperative diagnosis: bladder tumor  Postoperative diagnosis: Same  Procedure: 1 cystoscopy 2. Right ureteral stent removal 3. Transurethral resection of bladder tumor, small  Attending: Rosie Fate  Anesthesia: General  Estimated blood loss: Minimal  Drains: 22 French foley  Specimens: bladder tumor right lateral wall  Antibiotics: ancef  Findings: 0.5cm papillary right lateral wall tumor.  Ureteral orifices in normal anatomic location.   Indications: Patient is a 86 year old male/male with a history of T1G3 bladder cancer.  After discussing treatment options, they decided proceed with transurethral resection of a bladder tumor.  Procedure her in detail: The patient was brought to the operating room and a brief timeout was done to ensure correct patient, correct procedure, correct site.  General anesthesia was administered patient was placed in dorsal lithotomy position.  Their genitalia was then prepped and draped in usual sterile fashion.  A rigid 86 French cystoscope was passed in the urethra and the bladder.  Bladder was inspected and we noted small 0.5cm residual bladder tumor at the previous resection site.  the ureteral orifices were in the normal orthotopic locations. The right ureteral stent was then removed. We then removed the cystoscope and placed a resectoscope into the bladder.  Using the bipolar resectoscope we removed the bladder tumor down to the base. A subsequent muscle deep biopsy was then taken. Hemostasis was then obtained with electrocautery. We then removed the bladder tumor chips and sent them for pathology. We then re-inspected the bladder and found no residula bleeding.  the bladder was then drained, a 22 French foley was placed and this concluded the procedure which was well tolerated by patient.  Complications: None  Condition: Stable, extubated, transferred to PACU  Plan: Patient is to be discharged home and followup in 5 days for foley catheter  removal and pathology discussion.

## 2019-10-30 NOTE — Anesthesia Postprocedure Evaluation (Signed)
Anesthesia Post Note  Patient: Publishing copy  Procedure(s) Performed: TRANSURETHRAL RESECTION OF BLADDER TUMOR (TURBT), REMOVAL OF RIGHT STENT (N/A Bladder) CYSTOSCOPY (N/A Bladder)     Patient location during evaluation: PACU Anesthesia Type: General Level of consciousness: awake and alert Pain management: pain level controlled Vital Signs Assessment: post-procedure vital signs reviewed and stable Respiratory status: spontaneous breathing, nonlabored ventilation and respiratory function stable Cardiovascular status: blood pressure returned to baseline, stable and bradycardic Postop Assessment: no apparent nausea or vomiting Anesthetic complications: no    Last Vitals:  Vitals:   10/30/19 1345 10/30/19 1415  BP: (!) 179/57 (!) 192/62  Pulse: (!) 45   Resp: 11   Temp:    SpO2: 100%     Last Pain:  Vitals:   10/30/19 1330  TempSrc:   PainSc: 0-No pain                 Audry Pili

## 2019-10-30 NOTE — Anesthesia Procedure Notes (Signed)
Procedure Name: LMA Insertion Date/Time: 10/30/2019 12:19 PM Performed by: Lollie Sails, CRNA Pre-anesthesia Checklist: Patient identified, Emergency Drugs available, Suction available, Patient being monitored and Timeout performed Patient Re-evaluated:Patient Re-evaluated prior to induction Oxygen Delivery Method: Circle system utilized Preoxygenation: Pre-oxygenation with 100% oxygen Induction Type: IV induction Ventilation: Mask ventilation without difficulty LMA: LMA inserted LMA Size: 4.0 Number of attempts: 1 Placement Confirmation: positive ETCO2 and breath sounds checked- equal and bilateral Tube secured with: Tape Dental Injury: Teeth and Oropharynx as per pre-operative assessment  Comments: Upper left central incisor very loose prior to placement of LMA - tooth intact with no change post LMA placement.

## 2019-10-31 LAB — SURGICAL PATHOLOGY

## 2019-11-08 DIAGNOSIS — C672 Malignant neoplasm of lateral wall of bladder: Secondary | ICD-10-CM | POA: Diagnosis not present

## 2019-12-03 DIAGNOSIS — Z7689 Persons encountering health services in other specified circumstances: Secondary | ICD-10-CM | POA: Diagnosis not present

## 2019-12-03 DIAGNOSIS — C61 Malignant neoplasm of prostate: Secondary | ICD-10-CM | POA: Diagnosis not present

## 2019-12-03 DIAGNOSIS — I2699 Other pulmonary embolism without acute cor pulmonale: Secondary | ICD-10-CM | POA: Diagnosis not present

## 2019-12-03 DIAGNOSIS — C679 Malignant neoplasm of bladder, unspecified: Secondary | ICD-10-CM | POA: Diagnosis not present

## 2019-12-03 DIAGNOSIS — E785 Hyperlipidemia, unspecified: Secondary | ICD-10-CM | POA: Diagnosis not present

## 2019-12-11 DIAGNOSIS — Z5111 Encounter for antineoplastic chemotherapy: Secondary | ICD-10-CM | POA: Diagnosis not present

## 2019-12-11 DIAGNOSIS — C672 Malignant neoplasm of lateral wall of bladder: Secondary | ICD-10-CM | POA: Diagnosis not present

## 2019-12-18 DIAGNOSIS — C672 Malignant neoplasm of lateral wall of bladder: Secondary | ICD-10-CM | POA: Diagnosis not present

## 2019-12-18 DIAGNOSIS — R300 Dysuria: Secondary | ICD-10-CM | POA: Diagnosis not present

## 2019-12-25 DIAGNOSIS — C672 Malignant neoplasm of lateral wall of bladder: Secondary | ICD-10-CM | POA: Diagnosis not present

## 2019-12-25 DIAGNOSIS — Z5111 Encounter for antineoplastic chemotherapy: Secondary | ICD-10-CM | POA: Diagnosis not present

## 2019-12-27 DIAGNOSIS — H524 Presbyopia: Secondary | ICD-10-CM | POA: Diagnosis not present

## 2019-12-27 DIAGNOSIS — Z961 Presence of intraocular lens: Secondary | ICD-10-CM | POA: Diagnosis not present

## 2019-12-27 DIAGNOSIS — H2512 Age-related nuclear cataract, left eye: Secondary | ICD-10-CM | POA: Diagnosis not present

## 2019-12-27 DIAGNOSIS — H40012 Open angle with borderline findings, low risk, left eye: Secondary | ICD-10-CM | POA: Diagnosis not present

## 2020-01-01 DIAGNOSIS — C672 Malignant neoplasm of lateral wall of bladder: Secondary | ICD-10-CM | POA: Diagnosis not present

## 2020-01-01 DIAGNOSIS — Z5111 Encounter for antineoplastic chemotherapy: Secondary | ICD-10-CM | POA: Diagnosis not present

## 2020-01-08 DIAGNOSIS — Z5111 Encounter for antineoplastic chemotherapy: Secondary | ICD-10-CM | POA: Diagnosis not present

## 2020-01-08 DIAGNOSIS — C672 Malignant neoplasm of lateral wall of bladder: Secondary | ICD-10-CM | POA: Diagnosis not present

## 2020-01-15 DIAGNOSIS — C672 Malignant neoplasm of lateral wall of bladder: Secondary | ICD-10-CM | POA: Diagnosis not present

## 2020-01-15 DIAGNOSIS — Z5111 Encounter for antineoplastic chemotherapy: Secondary | ICD-10-CM | POA: Diagnosis not present

## 2020-01-22 DIAGNOSIS — Z5111 Encounter for antineoplastic chemotherapy: Secondary | ICD-10-CM | POA: Diagnosis not present

## 2020-01-22 DIAGNOSIS — C672 Malignant neoplasm of lateral wall of bladder: Secondary | ICD-10-CM | POA: Diagnosis not present

## 2020-01-30 ENCOUNTER — Emergency Department (HOSPITAL_COMMUNITY): Payer: Medicare HMO

## 2020-01-30 ENCOUNTER — Encounter (HOSPITAL_COMMUNITY): Payer: Self-pay | Admitting: Emergency Medicine

## 2020-01-30 ENCOUNTER — Inpatient Hospital Stay (HOSPITAL_COMMUNITY)
Admission: EM | Admit: 2020-01-30 | Discharge: 2020-02-04 | DRG: 871 | Disposition: A | Discharge status: Home / Self Care | Payer: Medicare HMO | Attending: Internal Medicine | Admitting: Internal Medicine

## 2020-01-30 ENCOUNTER — Other Ambulatory Visit: Payer: Self-pay

## 2020-01-30 DIAGNOSIS — R0602 Shortness of breath: Secondary | ICD-10-CM | POA: Diagnosis not present

## 2020-01-30 DIAGNOSIS — R262 Difficulty in walking, not elsewhere classified: Secondary | ICD-10-CM | POA: Diagnosis present

## 2020-01-30 DIAGNOSIS — N390 Urinary tract infection, site not specified: Secondary | ICD-10-CM

## 2020-01-30 DIAGNOSIS — R410 Disorientation, unspecified: Secondary | ICD-10-CM

## 2020-01-30 DIAGNOSIS — C679 Malignant neoplasm of bladder, unspecified: Secondary | ICD-10-CM | POA: Diagnosis present

## 2020-01-30 DIAGNOSIS — Z79891 Long term (current) use of opiate analgesic: Secondary | ICD-10-CM

## 2020-01-30 DIAGNOSIS — Z7901 Long term (current) use of anticoagulants: Secondary | ICD-10-CM | POA: Diagnosis not present

## 2020-01-30 DIAGNOSIS — E43 Unspecified severe protein-calorie malnutrition: Secondary | ICD-10-CM | POA: Diagnosis not present

## 2020-01-30 DIAGNOSIS — E46 Unspecified protein-calorie malnutrition: Secondary | ICD-10-CM

## 2020-01-30 DIAGNOSIS — M1990 Unspecified osteoarthritis, unspecified site: Secondary | ICD-10-CM | POA: Diagnosis present

## 2020-01-30 DIAGNOSIS — I712 Thoracic aortic aneurysm, without rupture: Secondary | ICD-10-CM

## 2020-01-30 DIAGNOSIS — Z86718 Personal history of other venous thrombosis and embolism: Secondary | ICD-10-CM

## 2020-01-30 DIAGNOSIS — Z6823 Body mass index (BMI) 23.0-23.9, adult: Secondary | ICD-10-CM

## 2020-01-30 DIAGNOSIS — G928 Other toxic encephalopathy: Secondary | ICD-10-CM | POA: Diagnosis present

## 2020-01-30 DIAGNOSIS — J439 Emphysema, unspecified: Secondary | ICD-10-CM | POA: Diagnosis not present

## 2020-01-30 DIAGNOSIS — Z86711 Personal history of pulmonary embolism: Secondary | ICD-10-CM | POA: Diagnosis not present

## 2020-01-30 DIAGNOSIS — Z8546 Personal history of malignant neoplasm of prostate: Secondary | ICD-10-CM | POA: Diagnosis not present

## 2020-01-30 DIAGNOSIS — I440 Atrioventricular block, first degree: Secondary | ICD-10-CM | POA: Diagnosis present

## 2020-01-30 DIAGNOSIS — N3000 Acute cystitis without hematuria: Secondary | ICD-10-CM | POA: Diagnosis not present

## 2020-01-30 DIAGNOSIS — Z8249 Family history of ischemic heart disease and other diseases of the circulatory system: Secondary | ICD-10-CM | POA: Diagnosis not present

## 2020-01-30 DIAGNOSIS — Z833 Family history of diabetes mellitus: Secondary | ICD-10-CM

## 2020-01-30 DIAGNOSIS — Z8551 Personal history of malignant neoplasm of bladder: Secondary | ICD-10-CM | POA: Diagnosis not present

## 2020-01-30 DIAGNOSIS — Z20822 Contact with and (suspected) exposure to covid-19: Secondary | ICD-10-CM | POA: Diagnosis not present

## 2020-01-30 DIAGNOSIS — Z8619 Personal history of other infectious and parasitic diseases: Secondary | ICD-10-CM

## 2020-01-30 DIAGNOSIS — Z87891 Personal history of nicotine dependence: Secondary | ICD-10-CM | POA: Diagnosis not present

## 2020-01-30 DIAGNOSIS — A419 Sepsis, unspecified organism: Principal | ICD-10-CM | POA: Diagnosis present

## 2020-01-30 DIAGNOSIS — R001 Bradycardia, unspecified: Secondary | ICD-10-CM | POA: Diagnosis not present

## 2020-01-30 DIAGNOSIS — G9341 Metabolic encephalopathy: Secondary | ICD-10-CM | POA: Diagnosis present

## 2020-01-30 DIAGNOSIS — R531 Weakness: Secondary | ICD-10-CM | POA: Diagnosis not present

## 2020-01-30 DIAGNOSIS — Z79899 Other long term (current) drug therapy: Secondary | ICD-10-CM | POA: Diagnosis not present

## 2020-01-30 DIAGNOSIS — G92 Toxic encephalopathy: Secondary | ICD-10-CM | POA: Diagnosis present

## 2020-01-30 DIAGNOSIS — I7121 Aneurysm of the ascending aorta, without rupture: Secondary | ICD-10-CM | POA: Diagnosis present

## 2020-01-30 DIAGNOSIS — G9340 Encephalopathy, unspecified: Secondary | ICD-10-CM

## 2020-01-30 DIAGNOSIS — J939 Pneumothorax, unspecified: Secondary | ICD-10-CM | POA: Diagnosis not present

## 2020-01-30 DIAGNOSIS — R079 Chest pain, unspecified: Secondary | ICD-10-CM | POA: Diagnosis not present

## 2020-01-30 DIAGNOSIS — R519 Headache, unspecified: Secondary | ICD-10-CM | POA: Diagnosis not present

## 2020-01-30 DIAGNOSIS — Z9079 Acquired absence of other genital organ(s): Secondary | ICD-10-CM | POA: Diagnosis not present

## 2020-01-30 HISTORY — DX: Personal history of other infectious and parasitic diseases: Z86.19

## 2020-01-30 LAB — URINALYSIS, ROUTINE W REFLEX MICROSCOPIC
Bilirubin Urine: NEGATIVE
Glucose, UA: NEGATIVE mg/dL
Ketones, ur: 20 mg/dL — AB
Nitrite: NEGATIVE
Protein, ur: 100 mg/dL — AB
Specific Gravity, Urine: >1.046 — ABNORMAL HIGH (ref 1.005–1.030)
WBC, UA: >50 WBC/hpf — ABNORMAL HIGH (ref 0–5)
pH: 6 (ref 5.0–8.0)

## 2020-01-30 LAB — COMPREHENSIVE METABOLIC PANEL
ALT: 16 U/L (ref 0–44)
AST: 31 U/L (ref 15–41)
Albumin: 3.4 g/dL — ABNORMAL LOW (ref 3.5–5.0)
Alkaline Phosphatase: 42 U/L (ref 38–126)
Anion gap: 11 (ref 5–15)
BUN: 14 mg/dL (ref 8–23)
CO2: 23 mmol/L (ref 22–32)
Calcium: 9.5 mg/dL (ref 8.9–10.3)
Chloride: 102 mmol/L (ref 98–111)
Creatinine, Ser: 1.16 mg/dL (ref 0.61–1.24)
GFR calc Af Amer: >60 mL/min (ref >60)
GFR calc non Af Amer: 58 mL/min — ABNORMAL LOW (ref >60)
Glucose, Bld: 147 mg/dL — ABNORMAL HIGH (ref 70–99)
Potassium: 3.8 mmol/L (ref 3.5–5.1)
Sodium: 136 mmol/L (ref 135–145)
Total Bilirubin: 1.4 mg/dL — ABNORMAL HIGH (ref 0.3–1.2)
Total Protein: 7.6 g/dL (ref 6.5–8.1)

## 2020-01-30 LAB — CBC
HCT: 34.7 % — ABNORMAL LOW (ref 39.0–52.0)
Hemoglobin: 10.9 g/dL — ABNORMAL LOW (ref 13.0–17.0)
MCH: 26.7 pg (ref 26.0–34.0)
MCHC: 31.4 g/dL (ref 30.0–36.0)
MCV: 85 fL (ref 80.0–100.0)
Platelets: 223 10*3/uL (ref 150–400)
RBC: 4.08 MIL/uL — ABNORMAL LOW (ref 4.22–5.81)
RDW: 15 % (ref 11.5–15.5)
WBC: 9.7 10*3/uL (ref 4.0–10.5)
nRBC: 0 % (ref 0.0–0.2)

## 2020-01-30 LAB — BRAIN NATRIURETIC PEPTIDE: B Natriuretic Peptide: 100.8 pg/mL — ABNORMAL HIGH (ref 0.0–100.0)

## 2020-01-30 LAB — PROTIME-INR
INR: 1.4 — ABNORMAL HIGH (ref 0.8–1.2)
Prothrombin Time: 16.2 seconds — ABNORMAL HIGH (ref 11.4–15.2)

## 2020-01-30 LAB — SARS CORONAVIRUS 2 BY RT PCR (HOSPITAL ORDER, PERFORMED IN ~~LOC~~ HOSPITAL LAB): SARS Coronavirus 2: NEGATIVE

## 2020-01-30 LAB — LACTIC ACID, PLASMA: Lactic Acid, Venous: 1.5 mmol/L (ref 0.5–1.9)

## 2020-01-30 LAB — TROPONIN I (HIGH SENSITIVITY)
Troponin I (High Sensitivity): 23 ng/L — ABNORMAL HIGH (ref <18)
Troponin I (High Sensitivity): 26 ng/L — ABNORMAL HIGH (ref <18)

## 2020-01-30 MED ORDER — ACETAMINOPHEN 500 MG PO TABS
1000.0000 mg | ORAL_TABLET | Freq: Once | ORAL | Status: AC
  Administered 2020-01-30: 1000 mg via ORAL
  Filled 2020-01-30: qty 2

## 2020-01-30 MED ORDER — ONDANSETRON HCL 4 MG PO TABS: 4.0000 mg | ORAL_TABLET | Freq: Four times a day (QID) | ORAL | Status: DC | PRN

## 2020-01-30 MED ORDER — IOHEXOL 350 MG/ML SOLN
100.0000 mL | Freq: Once | INTRAVENOUS | Status: AC | PRN
  Administered 2020-01-30: 100 mL via INTRAVENOUS

## 2020-01-30 MED ORDER — SODIUM CHLORIDE 0.9 % IV BOLUS
500.0000 mL | Freq: Once | INTRAVENOUS | Status: AC
  Administered 2020-01-30: 500 mL via INTRAVENOUS

## 2020-01-30 MED ORDER — ACETAMINOPHEN 650 MG RE SUPP: 650.0000 mg | Freq: Four times a day (QID) | RECTAL | Status: DC | PRN

## 2020-01-30 MED ORDER — ONDANSETRON HCL 4 MG/2ML IJ SOLN: 4.0000 mg | Freq: Four times a day (QID) | INTRAMUSCULAR | Status: DC | PRN

## 2020-01-30 MED ORDER — ACETAMINOPHEN 325 MG PO TABS
650.0000 mg | ORAL_TABLET | Freq: Four times a day (QID) | ORAL | Status: DC | PRN
  Administered 2020-01-31: 650 mg via ORAL
  Filled 2020-01-30: qty 2

## 2020-01-30 MED ORDER — SODIUM CHLORIDE 0.9 % IV BOLUS
1000.0000 mL | Freq: Once | INTRAVENOUS | Status: AC
  Administered 2020-01-30: 1000 mL via INTRAVENOUS

## 2020-01-30 MED ORDER — SODIUM CHLORIDE 0.9 % IV SOLN
1.0000 g | Freq: Once | INTRAVENOUS | Status: AC
  Administered 2020-01-30: 1 g via INTRAVENOUS
  Filled 2020-01-30: qty 10

## 2020-01-30 NOTE — ED Triage Notes (Signed)
Pt presents from home by EMS with [facility] w/exersion -reports falling last week -generalized weakness  EMS reports that Pt's home was very hot -received 1000mg  Tylenol from EMS for temp 102.4

## 2020-01-30 NOTE — H&P (Signed)
History and Physical    Hasnain Manheim GLO:[id number] DOB: [birth year] DOA: 01/30/2020  PCP: Velna Hatchet, MD   Patient coming from: Home   Chief Complaint: Fever, generalized weakness   HPI: Noboru Bidinger is a 86 y.o. male with medical history significant for bladder cancer status post transurethral resection in March, history of prostate cancer remotely, thoracic aortic aneurysm, and history of DVT and PE on Xarelto, now presenting to the emergency department with fatigue and generalized weakness.  Patient is disoriented in the emergency department which limits the history.  He reports being unable to walk due to weakness but has a hard time saying when this began.  He reports exertional dyspnea but denies cough or chest pain.  He reports feeling very cold but not sure if he has had a fever.  EMS was called and the patient was found to be febrile to 102.4 F.  He was treated with 1 g of acetaminophen by EMS and brought into the ED.  ED Course: Upon arrival to the ED, patient is found to have a temperature of 37.8 C after receiving acetaminophen with EMS, oxygen saturation in the upper 90s on room air, slight tachypnea, and blood pressure 101/51.  EKG features sinus rhythm and chest x-ray with stable emphysematous changes.  Noncontrast head CT negative for acute intracranial abnormality.  CT chest negative for acute intrathoracic pathology and negative for PE but notable for stable ascending aortic aneurysm.  Chemistry panel with slight elevation in total bilirubin.  CBC with mild anemia.  Lactic acid reassuringly normal.  Troponin slightly elevated.  A blood culture was obtained and the patient was given 1.5 L of normal saline, 1 g acetaminophen, and 1 g IV Rocephin in the ED.  COVID-19 screening test is negative.   Review of Systems:  Unable to complete ROS secondary the patient's clinical condition.  Past Medical History:  Diagnosis Date  . 1st degree AV block 09/14/2016   hx of left  anterior fasicular block on ekg  . Aortic aneurysm [facility])    sees dr bartle 4.6 x 4.5 cm stable per 07-16-2019 chest ct  . Arthritis   . Balanitis   . Bladder cancer ([place])   . Dizziness 09/14/2016   resolved  . DVT (deep venous thrombosis) ([place]) 03/2018  . Prostate cancer ([place]) 2003   seed implants  . Pulmonary embolism ([place]) 03/2018  . Sinus bradycardia 09/16/2016    Past Surgical History:  Procedure Laterality Date  . CYSTOSCOPY N/A 10/30/2019   Procedure: CYSTOSCOPY;  Surgeon: Cleon Gustin, MD;  Location: [facility];  Service: Urology;  Laterality: N/A;  . CYSTOSCOPY W/ RETROGRADES Bilateral 09/20/2019   Procedure: CYSTOSCOPY WITH RETROGRADE PYELOGRAM;  Surgeon: Cleon Gustin, MD;  Location: [facility];  Service: Urology;  Laterality: Bilateral;  . FOOT SURGERY  yrs ago  . TRANSURETHRAL RESECTION OF BLADDER TUMOR N/A 09/20/2019   Procedure: TRANSURETHRAL RESECTION OF BLADDER TUMOR (TURBT);  Surgeon: Cleon Gustin, MD;  Location: [facility];  Service: Urology;  Laterality: N/A;  1 HR  . TRANSURETHRAL RESECTION OF BLADDER TUMOR N/A 10/30/2019   Procedure: TRANSURETHRAL RESECTION OF BLADDER TUMOR (TURBT), REMOVAL OF RIGHT STENT;  Surgeon: Cleon Gustin, MD;  Location: [facility];  Service: Urology;  Laterality: N/A;     reports that he quit smoking about 34 years ago. His smoking use included cigarettes. He has a 20.00 pack-year smoking history. He has never used smokeless tobacco. He  reports that he does not drink alcohol and does not use drugs.  No Known Allergies  Family History  Problem Relation Age of Onset  . Diabetes Sister   . Hypertension Mother        family history  . Healthy Father   . Colon cancer Neg Hx      Prior to Admission medications   Medication Sig Start Date End Date Taking? Authorizing Provider  XARELTO 20 MG TABS tablet Take 20 mg by mouth daily.  02/21/19  Yes [provider]  Multiple Vitamins-Minerals (MULTIVITAMIN WITH MINERALS) tablet Take 1 tablet by mouth daily. Patient not taking: Reported on 01/30/2020    [provider]  traMADol (ULTRAM) 50 MG tablet Take 1 tablet (50 mg total) by mouth every 6 (six) hours as needed for moderate pain. Patient not taking: Reported on 01/30/2020 10/30/19 10/29/20  Cleon Gustin, MD    Physical Exam: Vitals:   01/30/20 2308 01/30/20 2315 01/30/20 2328 01/30/20 2345  BP: (!) 138/54 (!) 114/52 (!) 113/53 (!) 121/49  Pulse: 84 80 79 79  Resp: (!) 22 (!) 23 (!) 25 18  Temp:      TempSrc:      SpO2: 99% 97% 97% 97%    Constitutional: NAD, calm  Eyes: PERTLA, lids and conjunctivae normal ENMT: Mucous membranes are moist. Posterior pharynx clear of any exudate or lesions.   Neck: normal, supple, no masses, no thyromegaly Respiratory:  no wheezing, no crackles. Mild tachypnea. No accessory muscle use.  Cardiovascular: S1 & S2 heard, regular rate and rhythm. No extremity edema.   Abdomen: No distension, soft. Suprapubic tenderness. Bowel sounds active.  Musculoskeletal: no clubbing / cyanosis. No joint deformity upper and lower extremities.   Skin: no significant rashes, lesions, ulcers. Warm, dry, well-perfused. Neurologic: No gross facial asymmetry. Sensation intact. Moving all extremities.  Psychiatric: Sleeping, wakes to voice and oriented to person only. Pleasant and cooperative.    Labs and Imaging on Admission: I have personally reviewed following labs and imaging studies  CBC: Recent Labs  Lab 01/30/20 1806  WBC 9.7  HGB 10.9*  HCT 34.7*  MCV 85.0  PLT 366   Basic Metabolic Panel: Recent Labs  Lab 01/30/20 1806  NA 136  K 3.8  CL 102  CO2 23  GLUCOSE 147*  BUN 14  CREATININE 1.16  CALCIUM 9.5   GFR: CrCl cannot be calculated (Unknown ideal weight.). Liver Function Tests: Recent Labs  Lab 01/30/20 1806  AST 31  ALT 16  ALKPHOS 42  BILITOT 1.4*  PROT 7.6   ALBUMIN 3.4*   No results for input(s): LIPASE, AMYLASE in the last 168 hours. No results for input(s): AMMONIA in the last 168 hours. Coagulation Profile: Recent Labs  Lab 01/30/20 1806  INR 1.4*   Cardiac Enzymes: No results for input(s): CKTOTAL, CKMB, CKMBINDEX, TROPONINI in the last 168 hours. BNP (last 3 results) No results for input(s): PROBNP in the last 8760 hours. HbA1C: No results for input(s): HGBA1C in the last 72 hours. CBG: No results for input(s): GLUCAP in the last 168 hours. Lipid Profile: No results for input(s): CHOL, HDL, LDLCALC, TRIG, CHOLHDL, LDLDIRECT in the last 72 hours. Thyroid Function Tests: No results for input(s): TSH, T4TOTAL, FREET4, T3FREE, THYROIDAB in the last 72 hours. Anemia Panel: No results for input(s): VITAMINB12, FOLATE, FERRITIN, TIBC, IRON, RETICCTPCT in the last 72 hours. Urine analysis:    Component Value Date/Time   COLORURINE YELLOW 01/30/2020 2035  APPEARANCEUR CLOUDY (A) 01/30/2020 2035   LABSPEC >1.046 (H) 01/30/2020 2035   PHURINE 6.0 01/30/2020 2035   GLUCOSEU NEGATIVE 01/30/2020 2035   HGBUR LARGE (A) 01/30/2020 2035   BILIRUBINUR NEGATIVE 01/30/2020 2035   KETONESUR 20 (A) 01/30/2020 2035   PROTEINUR 100 (A) 01/30/2020 2035   UROBILINOGEN 0.2 02/26/2019 1011   NITRITE NEGATIVE 01/30/2020 2035   LEUKOCYTESUR LARGE (A) 01/30/2020 2035   Sepsis Labs: @LABRCNTIP (procalcitonin:4,lacticidven:4) ) Recent Results (from the past 240 hour(s))  SARS Coronavirus 2 by RT PCR (hospital order, performed in [place] hospital lab) Nasopharyngeal Nasopharyngeal Swab     Status: None   Collection Time: 01/30/20 10:23 PM   Specimen: Nasopharyngeal Swab  Result Value Ref Range Status   SARS Coronavirus 2 NEGATIVE NEGATIVE Final    Comment: (NOTE) SARS-CoV-2 target nucleic acids are NOT DETECTED.  The SARS-CoV-2 RNA is generally detectable in upper and lower respiratory specimens during the acute phase of infection. The  lowest concentration of SARS-CoV-2 viral copies this assay can detect is 250 copies / mL. A negative result does not preclude SARS-CoV-2 infection and should not be used as the sole basis for treatment or other patient management decisions.  A negative result may occur with improper specimen collection / handling, submission of specimen other than nasopharyngeal swab, presence of viral mutation(s) within the areas targeted by this assay, and inadequate number of viral copies (<250 copies / mL). A negative result must be combined with clinical observations, patient history, and epidemiological information.  Fact Sheet for Patients:   StrictlyIdeas.no  Fact Sheet for Healthcare Providers: BankingDealers.co.za  This test is not yet approved or  cleared by the [place] FDA and has been authorized for detection and/or diagnosis of SARS-CoV-2 by FDA under an Emergency Use Authorization (EUA).  This EUA will remain in effect (meaning this test can be used) for the duration of the COVID-19 declaration under Section 564(b)(1) of the Act, 21 U.S.C. section 360bbb-3(b)(1), unless the authorization is terminated or revoked sooner.  Performed at [facility], [place] [address] [place], [place] [address]      Radiological Exams on Admission: CT Head Wo Contrast  Result Date: 01/30/2020 CLINICAL DATA:  [name], confusion, generalized weakness, history of prostate cancer EXAM: CT HEAD WITHOUT CONTRAST TECHNIQUE: Contiguous axial images were obtained from the base of the skull through the vertex without intravenous contrast. COMPARISON:  None. FINDINGS: Brain: Scattered hypodensities are seen throughout the periventricular white matter consistent with age-indeterminate small vessel ischemic change. No other signs of acute infarct or hemorrhage. Lateral ventricles and midline structures are unremarkable. No acute extra-axial fluid collections. No  mass effect. Vascular: No hyperdense vessel or unexpected calcification. Skull: Normal. Negative for fracture or focal lesion. Sinuses/Orbits: No acute finding. Other: None. IMPRESSION: 1. Age-indeterminate small-vessel ischemic changes in the white matter, favor chronic. 2. No acute intracranial trauma. Electronically Signed   By: Randa Ngo M.D.   On: 01/30/2020 20:09   CT ANGIO CHEST PE W OR WO CONTRAST  Result Date: 01/30/2020 CLINICAL DATA:  Shortness of breath with exertion and generalized weakness since falling last week. Possible fever. EXAM: CT ANGIOGRAPHY CHEST WITH CONTRAST TECHNIQUE: Multidetector CT imaging of the chest was performed using the standard protocol during bolus administration of intravenous contrast. Multiplanar CT image reconstructions and MIPs were obtained to evaluate the vascular anatomy. CONTRAST:  134mL OMNIPAQUE IOHEXOL 350 MG/ML SOLN COMPARISON:  Radiographs today and 04/05/2018. Chest CTA 07/16/2019. FINDINGS: Cardiovascular: The pulmonary arteries are well  opacified with contrast to the level of the subsegmental branches. There is no evidence of acute pulmonary embolism. There is preferential opacification of the pulmonary arteries, and only a small amount of aortic contrast. Grossly stable atherosclerosis of the aorta, great vessels and coronary arteries. The ascending aorta is dilated up to 4.6 cm, not significantly changed. No evidence of aortic dissection. There are prominent aortic valvular calcifications. The heart size is normal. There is no pericardial effusion. Mediastinum/Nodes: Small to moderate hiatal hernia with distal esophageal wall thickening, similar to previous study. No enlarged mediastinal, hilar or axillary lymph nodes identified. The thyroid gland and trachea demonstrate no significant findings. Lungs/Pleura: No pleural effusion or pneumothorax. Moderate centrilobular emphysema. There is minimal dependent atelectasis at both lung bases. The aeration  of the lungs has improved compared with the prior study. No suspicious pulmonary nodule. Upper abdomen: No acute findings are seen within the visualized upper abdomen. There is mild reflux of contrast into the IVC and hepatic veins. Left renal cysts are grossly stable. Musculoskeletal/Chest wall: There is no chest wall mass or suspicious osseous finding. Grossly stable chronic compression deformities involving the superior endplates of L1 and L2. Review of the MIP images confirms the above findings. IMPRESSION: 1. No evidence of acute pulmonary embolism or other acute chest process. 2. Grossly stable hiatal hernia and distal esophageal wall thickening. 3. Stable 4.6 cm ascending aortic aneurysm. Recommend semi-annual imaging followup by CTA or MRA and referral to cardiothoracic surgery if not already obtained. This recommendation follows 2010 ACCF/AHA/AATS/ACR/ASA/SCA/SCAI/SIR/STS/SVM Guidelines for the Diagnosis and Management of Patients With Thoracic Aortic Disease. Circulation. 2010; 121: E563-J497 4. Aortic Atherosclerosis (ICD10-I70.0) and Emphysema (ICD10-J43.9). Electronically Signed   By: Richardean Sale M.D.   On: 01/30/2020 20:15   DG Chest Port 1 View  Result Date: 01/30/2020 CLINICAL DATA:  Chest pain and shortness of breath EXAM: PORTABLE CHEST 1 VIEW COMPARISON:  04/04/2018 FINDINGS: Single frontal view of the chest demonstrates an unremarkable cardiac silhouette. Stable thoracic aortic ectasia and atherosclerosis. No acute airspace disease, effusion, or pneumothorax. Stable background emphysema. IMPRESSION: 1. Stable emphysema.  No acute process. Electronically Signed   By: Randa Ngo M.D.   On: 01/30/2020 18:11    EKG: Independently reviewed. Sinus rhythm, rate 63, QTc 543 ms.   Assessment/Plan   1. Acute encephalopathy  - Presents with lethargy and confusion, was febrile to 102.4 F with EMS and given 1 g APAP pta   - Head CT is negative for acute intracranial abnormality and there  is no meningismus  - Suspect this is secondary to infection and anticipate improvement with treatment as below    2. Sepsis suspected secondary to UTI  - Presents with confusion, lethargy, and fever without meningismus or acute findings on CT chest, but does have suprapubic tenderness and UA compatible with infection  - Blood culture was obtained in ED and empiric antibiotics started  - Add-on urine culture, continue empiric antibiotics, follow cultures and clinical response to treatment    3. History of PE  - No PE on CTA chest this admission  - Continue Xarelto    4. Bladder cancer  - Status-post TURBT in March 2021  - Continue urology follow-up after discharge    5. Ascending thoracic aortic aneurysm  - Noted on CTA in ED  - Semiannual imaging and CTS consultation recommended if not already established    DVT prophylaxis: Continue Xarelto  Code Status: Full; patient disoriented on admission and code status not discussed  Family Communication: Discussed with patient  Disposition Plan:  Patient is from: Home  Anticipated d/c is to: TBD Anticipated d/c date is: 02/03/20 Patient currently: Encephalopathic, in early sepsis, and requiring IV antibiotics and close monitoring  Consults called: None  Admission status: Inpatient     Vianne Bulls, MD [facility] Pager: See [email or web address]  If 7AM-7PM, please contact the daytime attending [email or web address]  01/31/2020, 12:02 AM

## 2020-01-30 NOTE — ED Provider Notes (Signed)
[facility] Emergency Department Provider Note MRN:  [id number]  Arrival date & time: 01/30/20     Chief Complaint   Shortness of breath History of Present Illness   Connor Morris is a 86 y.o. year-old male with a history of pulmonary embolism, aortic aneurysm presenting to the ED with chief complaint of shortness of breath.  Gradual onset shortness of breath that began yesterday, progressing since then.  Denies cough, unsure if he has had fever, denies chest pain, no abdominal pain, no numbness or weakness to the arms or legs, no burning with urination.  Symptoms are moderate, constant, no exacerbating or alleviating factors.  Review of Systems  A complete 10 system review of systems was obtained and all systems are negative except as noted in the HPI and PMH.   Patient's Health History    Past Medical History:  Diagnosis Date  . 1st degree AV block 09/14/2016   hx of left anterior fasicular block on ekg  . Aortic aneurysm [facility])    sees dr bartle 4.6 x 4.5 cm stable per 07-16-2019 chest ct  . Arthritis   . Balanitis   . Bladder cancer ([place])   . Dizziness 09/14/2016   resolved  . DVT (deep venous thrombosis) ([place]) 03/2018  . Prostate cancer ([place]) 2003   seed implants  . Pulmonary embolism ([place]) 03/2018  . Sinus bradycardia 09/16/2016    Past Surgical History:  Procedure Laterality Date  . CYSTOSCOPY N/A 10/30/2019   Procedure: CYSTOSCOPY;  Surgeon: Cleon Gustin, MD;  Location[facility];  Service: Urology;  Laterality: N/A;  . CYSTOSCOPY W/ RETROGRADES Bilateral 09/20/2019   Procedure: CYSTOSCOPY WITH RETROGRADE PYELOGRAM;  Surgeon: Cleon Gustin, MD;  Location: [facility];  Service: Urology;  Laterality: Bilateral;  . FOOT SURGERY  yrs ago  . TRANSURETHRAL RESECTION OF BLADDER TUMOR N/A 09/20/2019   Procedure: TRANSURETHRAL RESECTION OF BLADDER TUMOR (TURBT);  Surgeon: Cleon Gustin, MD;  Location:  [facility];  Service: Urology;  Laterality: N/A;  1 HR  . TRANSURETHRAL RESECTION OF BLADDER TUMOR N/A 10/30/2019   Procedure: TRANSURETHRAL RESECTION OF BLADDER TUMOR (TURBT), REMOVAL OF RIGHT STENT;  Surgeon: Cleon Gustin, MD;  Location: [facility];  Service: Urology;  Laterality: N/A;    Family History  Problem Relation Age of Onset  . Diabetes Sister   . Hypertension Mother        family history  . Healthy Father   . Colon cancer Neg Hx     Social History   Socioeconomic History  . Marital status: Divorced    Spouse name: Not on file  . Number of children: Not on file  . Years of education: Not on file  . Highest education level: Not on file  Occupational History  . Not on file  Tobacco Use  . Smoking status: Former Smoker    Packs/day: 1.00    Years: 20.00    Pack years: 20.00    Types: Cigarettes    Quit date: 09/14/1985    Years since quitting: 34.4  . Smokeless tobacco: Never Used  Vaping Use  . Vaping Use: Never used  Substance and Sexual Activity  . Alcohol use: No  . Drug use: No  . Sexual activity: Not Currently  Other Topics Concern  . Not on file  Social History Narrative  . Not on file   Social Determinants of Health   Financial Resource Strain:   .  Difficulty of Paying Living Expenses:   Food Insecurity:   . Worried About Charity fundraiser in the Last Year:   . Arboriculturist in the Last Year:   Transportation Needs:   . Film/video editor (Medical):   Marland Kitchen Lack of Transportation (Non-Medical):   Physical Activity:   . Days of Exercise per Week:   . Minutes of Exercise per Session:   Stress:   . Feeling of Stress :   Social Connections:   . Frequency of Communication with Friends and Family:   . Frequency of Social Gatherings with Friends and Family:   . Attends Religious Services:   . Active Member of Clubs or Organizations:   . Attends Archivist Meetings:   Marland Kitchen Marital Status:    Intimate Partner Violence:   . Fear of Current or Ex-Partner:   . Emotionally Abused:   Marland Kitchen Physically Abused:   . Sexually Abused:      Physical Exam   Vitals:   01/30/20 1915 01/30/20 1930  BP: (!) 120/50 (!) 113/51  Pulse: 65 64  Resp: (!) 22   Temp:    SpO2: 98% 99%    CONSTITUTIONAL: Chronically ill-appearing, NAD NEURO:  Alert and oriented x 3, no focal deficits EYES:  eyes equal and reactive ENT/NECK:  no LAD, no JVD CARDIO: Regular rate, well-perfused, normal S1 and S2 PULM:  CTAB no wheezing or rhonchi GI/GU:  normal bowel sounds, non-distended, non-tender MSK/SPINE:  No gross deformities, no edema SKIN:  no rash, atraumatic PSYCH:  Appropriate speech and behavior  *Additional and/or pertinent findings included in MDM below  Diagnostic and Interventional Summary    EKG Interpretation  Date/Time:  Wednesday January 30 2020 17:43:43 EDT Ventricular Rate:  86 PR Interval:    QRS Duration: 93 QT Interval:  378 QTC Calculation: 453 R Axis:   -56 Text Interpretation: Sinus rhythm Abnormal R-wave progression, early transition Inferior infarct, old No significant change was found Confirmed by Gerlene Fee [phone]) on 01/30/2020 6:00:26 PM      Labs Reviewed  CBC - Abnormal; Notable for the following components:      Result Value   RBC 4.08 (*)    Hemoglobin 10.9 (*)    HCT 34.7 (*)    All other components within normal limits  COMPREHENSIVE METABOLIC PANEL - Abnormal; Notable for the following components:   Glucose, Bld 147 (*)    Albumin 3.4 (*)    Total Bilirubin 1.4 (*)    GFR calc non Af Amer 58 (*)    All other components within normal limits  PROTIME-INR - Abnormal; Notable for the following components:   Prothrombin Time 16.2 (*)    INR 1.4 (*)    All other components within normal limits  BRAIN NATRIURETIC PEPTIDE - Abnormal; Notable for the following components:   B Natriuretic Peptide 100.8 (*)    All other components within normal limits   URINALYSIS, ROUTINE W REFLEX MICROSCOPIC - Abnormal; Notable for the following components:   APPearance CLOUDY (*)    Specific Gravity, Urine >1.046 (*)    Hgb urine dipstick LARGE (*)    Ketones, ur 20 (*)    Protein, ur 100 (*)    Leukocytes,Ua LARGE (*)    WBC, UA >50 (*)    Bacteria, UA MANY (*)    All other components within normal limits  TROPONIN I (HIGH SENSITIVITY) - Abnormal; Notable for the following components:   Troponin I (High Sensitivity)  23 (*)    All other components within normal limits  TROPONIN I (HIGH SENSITIVITY) - Abnormal; Notable for the following components:   Troponin I (High Sensitivity) 26 (*)    All other components within normal limits  CULTURE, BLOOD (SINGLE)  SARS CORONAVIRUS 2 BY RT PCR (HOSPITAL ORDER, [facility])  LACTIC ACID, PLASMA    CT ANGIO CHEST PE W OR WO CONTRAST  Final Result    CT Head Wo Contrast  Final Result    DG Chest Port 1 View  Final Result      Medications  cefTRIAXone (ROCEPHIN) 1 g in sodium chloride 0.9 % 100 mL IVPB (has no administration in time range)  sodium chloride 0.9 % bolus 500 mL (0 mLs Intravenous Stopped 01/30/20 1946)  iohexol (OMNIPAQUE) 350 MG/ML injection 100 mL (100 mLs Intravenous Contrast Given 01/30/20 1949)  sodium chloride 0.9 % bolus 1,000 mL (1,000 mLs Intravenous Bolus from Bag 01/30/20 2052)     Procedures  /  Critical Care Procedures  ED Course and Medical Decision Making  I have reviewed the triage vital signs, the nursing notes, and pertinent available records from the EMR.  Listed above are laboratory and imaging tests that I personally ordered, reviewed, and interpreted and then considered in my medical decision making (see below for details).      Oral temperature of 100, mildly tachypneic, distant lung sounds, otherwise hemodynamically stable.  Initial impression is pneumonia, possibly early sepsis.  Also considering pulmonary embolism given patient's  history of such, but he does endorse full compliance with his anticoagulant.  Does not appear fluid overloaded.  Work-up is pending, providing IV fluids.  Initial work-up is unrevealing, clear x-ray despite shortness of breath.  Given history of PE, and his INR not being therapeutic, CTA chest obtained to exclude PE as well as further eval for pneumonia.  CTA is unremarkable.  Urinalysis does come back with signs of infection.  On reassessment patient continues to be mildly confused, he has active rigors, will admit to hospitalist service for further care.  Barth Kirks. [place] Small, [place] [email or web address]  Final Clinical Impressions(s) / ED Diagnoses     ICD-10-CM   1. Acute cystitis without hematuria  N30.00   2. Confusion  R41.0     ED Discharge Orders    None       Discharge Instructions Discussed with and Provided to Patient:   Discharge Instructions   None       Maudie Flakes, MD 01/30/20 2205

## 2020-01-31 DIAGNOSIS — E43 Unspecified severe protein-calorie malnutrition: Secondary | ICD-10-CM

## 2020-01-31 DIAGNOSIS — N3000 Acute cystitis without hematuria: Secondary | ICD-10-CM

## 2020-01-31 DIAGNOSIS — G92 Toxic encephalopathy: Secondary | ICD-10-CM

## 2020-01-31 DIAGNOSIS — E46 Unspecified protein-calorie malnutrition: Secondary | ICD-10-CM

## 2020-01-31 LAB — CBC
HCT: 41 % (ref 39.0–52.0)
Hemoglobin: 12.9 g/dL — ABNORMAL LOW (ref 13.0–17.0)
MCH: 26.8 pg (ref 26.0–34.0)
MCHC: 31.5 g/dL (ref 30.0–36.0)
MCV: 85.2 fL (ref 80.0–100.0)
Platelets: 236 10*3/uL (ref 150–400)
RBC: 4.81 MIL/uL (ref 4.22–5.81)
RDW: 15.1 % (ref 11.5–15.5)
WBC: 19 10*3/uL — ABNORMAL HIGH (ref 4.0–10.5)
nRBC: 0 % (ref 0.0–0.2)

## 2020-01-31 LAB — BASIC METABOLIC PANEL
Anion gap: 12 (ref 5–15)
BUN: 11 mg/dL (ref 8–23)
CO2: 24 mmol/L (ref 22–32)
Calcium: 8.9 mg/dL (ref 8.9–10.3)
Chloride: 101 mmol/L (ref 98–111)
Creatinine, Ser: 1.06 mg/dL (ref 0.61–1.24)
GFR calc Af Amer: >60 mL/min (ref >60)
GFR calc non Af Amer: >60 mL/min (ref >60)
Glucose, Bld: 109 mg/dL — ABNORMAL HIGH (ref 70–99)
Potassium: 3.7 mmol/L (ref 3.5–5.1)
Sodium: 137 mmol/L (ref 135–145)

## 2020-01-31 LAB — HEPATIC FUNCTION PANEL
ALT: 14 U/L (ref 0–44)
AST: 34 U/L (ref 15–41)
Albumin: 3.1 g/dL — ABNORMAL LOW (ref 3.5–5.0)
Alkaline Phosphatase: 43 U/L (ref 38–126)
Bilirubin, Direct: 0.5 mg/dL — ABNORMAL HIGH (ref 0.0–0.2)
Indirect Bilirubin: 1.2 mg/dL — ABNORMAL HIGH (ref 0.3–0.9)
Total Bilirubin: 1.7 mg/dL — ABNORMAL HIGH (ref 0.3–1.2)
Total Protein: 7.1 g/dL (ref 6.5–8.1)

## 2020-01-31 MED ORDER — RIVAROXABAN 20 MG PO TABS
20.0000 mg | ORAL_TABLET | Freq: Every day | ORAL | Status: DC
  Administered 2020-01-31 – 2020-02-02 (×3): 20 mg via ORAL
  Filled 2020-01-31 (×5): qty 1

## 2020-01-31 MED ORDER — SODIUM CHLORIDE 0.9 % IV SOLN: INTRAVENOUS | Status: AC

## 2020-01-31 MED ORDER — SENNOSIDES-DOCUSATE SODIUM 8.6-50 MG PO TABS: 1.0000 | ORAL_TABLET | Freq: Every evening | ORAL | Status: DC | PRN

## 2020-01-31 MED ORDER — SODIUM CHLORIDE 0.9% FLUSH
3.0000 mL | Freq: Two times a day (BID) | INTRAVENOUS | Status: DC
  Administered 2020-01-31 – 2020-02-04 (×8): 3 mL via INTRAVENOUS

## 2020-01-31 MED ORDER — SODIUM CHLORIDE 0.9 % IV SOLN
2.0000 g | Freq: Two times a day (BID) | INTRAVENOUS | Status: DC
  Administered 2020-01-31 (×3): 2 g via INTRAVENOUS
  Filled 2020-01-31 (×4): qty 2

## 2020-01-31 NOTE — Progress Notes (Signed)
Pharmacy Antibiotic Note  Connor Morris is a 86 y.o. male admitted on 01/30/2020 with sepsis/UTI.  Pharmacy has been consulted for Cefepime dosing. WBC WNL. Renal function ok. Reported fever at home up to 102.4.   Plan: Cefepime 2g IV q12h Trend WBC, temp, renal function  F/U infectious work-up   Weight: 57.2 kg (126 lb)  Temp (24hrs), Avg:99 F (37.2 C), Min:98.2 F (36.8 C), Max:100 F (37.8 C)  Recent Labs  Lab 01/30/20 1806  WBC 9.7  CREATININE 1.16  LATICACIDVEN 1.5    Estimated Creatinine Clearance: 38.4 mL/min (by C-G formula based on SCr of 1.16 mg/dL).    No Known Allergies  Narda Bonds, PharmD, BCPS Clinical Pharmacist Phone: [phone]

## 2020-01-31 NOTE — Progress Notes (Signed)
[facility] PROGRESS NOTE    Progress Note  Connor Morris  KXF:[id number] DOB: Jan 17, 1934 DOA: 01/30/2020 PCP: Velna Hatchet, MD     Brief Narrative:   Connor Morris is an 86 y.o. male history significant of bladder cancer status post transurethral resection, prostate cancer remotely thoracic aortic aneurysm history of DVT and PE on Xarelto presents to the ED with fatigue and generalized weakness.  Is found to be disoriented in the ED unable to walk due to his significant weakness and he also reported exertional dyspnea.  In the ED he was found to have a temperature of 102  Assessment/Plan:   Acute toxic metabolic encephalopathy: Likely due to infectious etiology, he was given Tylenol for his fevers. Started empirically on antibiotics to below for further details. Encephalopathy has resolved.  Sepsis secondary to UTI: Presented with confusion lethargy and fevers and admission CT of the head and chest showed no acute abnormalities, he does have suprapubic tenderness and UA is compatible with infection. Blood cultures have been ordered.  He was started empirically on antibiotics. Has her worsening leukocytosis.  Blood cultures and urine cultures have been sent.  History of PE: CTA unremarkable continue Xarelto.  Post bladder cancer resection status post TURP on 11/03/2019 follow-up with urology upon discharge.  Ascending aortic aneurysm: CTA in the ED unremarkable.  Severe protein caloric malnutrition: Ensure therapy.    DVT prophylaxis: heparin Family Communication:none Status is: Inpatient  Remains inpatient appropriate because:Hemodynamically unstable   Dispo: The patient is from: Home              Anticipated d/c is to: SNF              Anticipated d/c date is: > 3 days              Patient currently is not medically stable to d/c.    Code Status:     Code Status Orders  (From admission, onward)         Start     Ordered   01/31/20 0122  Full  code  Continuous        01/31/20 0121        Code Status History    Date Active Date Inactive Code Status Order ID Comments User Context   04/05/2018 0356 04/06/2018 1756 Full Code [id number]  Etta Quill, DO ED   Advance Care Planning Activity        IV Access:    Peripheral IV   Procedures and diagnostic studies:   CT Head Wo Contrast  Result Date: 01/30/2020 CLINICAL DATA:  [name], confusion, generalized weakness, history of prostate cancer EXAM: CT HEAD WITHOUT CONTRAST TECHNIQUE: Contiguous axial images were obtained from the base of the skull through the vertex without intravenous contrast. COMPARISON:  None. FINDINGS: Brain: Scattered hypodensities are seen throughout the periventricular white matter consistent with age-indeterminate small vessel ischemic change. No other signs of acute infarct or hemorrhage. Lateral ventricles and midline structures are unremarkable. No acute extra-axial fluid collections. No mass effect. Vascular: No hyperdense vessel or unexpected calcification. Skull: Normal. Negative for fracture or focal lesion. Sinuses/Orbits: No acute finding. Other: None. IMPRESSION: 1. Age-indeterminate small-vessel ischemic changes in the white matter, favor chronic. 2. No acute intracranial trauma. Electronically Signed   By: Randa Ngo M.D.   On: 01/30/2020 20:09   CT ANGIO CHEST PE W OR WO CONTRAST  Result Date: 01/30/2020 CLINICAL DATA:  Shortness of breath with exertion and generalized weakness since falling last  week. Possible fever. EXAM: CT ANGIOGRAPHY CHEST WITH CONTRAST TECHNIQUE: Multidetector CT imaging of the chest was performed using the standard protocol during bolus administration of intravenous contrast. Multiplanar CT image reconstructions and MIPs were obtained to evaluate the vascular anatomy. CONTRAST:  161mL OMNIPAQUE IOHEXOL 350 MG/ML SOLN COMPARISON:  Radiographs today and 04/05/2018. Chest CTA 07/16/2019. FINDINGS: Cardiovascular: The  pulmonary arteries are well opacified with contrast to the level of the subsegmental branches. There is no evidence of acute pulmonary embolism. There is preferential opacification of the pulmonary arteries, and only a small amount of aortic contrast. Grossly stable atherosclerosis of the aorta, great vessels and coronary arteries. The ascending aorta is dilated up to 4.6 cm, not significantly changed. No evidence of aortic dissection. There are prominent aortic valvular calcifications. The heart size is normal. There is no pericardial effusion. Mediastinum/Nodes: Small to moderate hiatal hernia with distal esophageal wall thickening, similar to previous study. No enlarged mediastinal, hilar or axillary lymph nodes identified. The thyroid gland and trachea demonstrate no significant findings. Lungs/Pleura: No pleural effusion or pneumothorax. Moderate centrilobular emphysema. There is minimal dependent atelectasis at both lung bases. The aeration of the lungs has improved compared with the prior study. No suspicious pulmonary nodule. Upper abdomen: No acute findings are seen within the visualized upper abdomen. There is mild reflux of contrast into the IVC and hepatic veins. Left renal cysts are grossly stable. Musculoskeletal/Chest wall: There is no chest wall mass or suspicious osseous finding. Grossly stable chronic compression deformities involving the superior endplates of L1 and L2. Review of the MIP images confirms the above findings. IMPRESSION: 1. No evidence of acute pulmonary embolism or other acute chest process. 2. Grossly stable hiatal hernia and distal esophageal wall thickening. 3. Stable 4.6 cm ascending aortic aneurysm. Recommend semi-annual imaging followup by CTA or MRA and referral to cardiothoracic surgery if not already obtained. This recommendation follows 2010 ACCF/AHA/AATS/ACR/ASA/SCA/SCAI/SIR/STS/SVM Guidelines for the Diagnosis and Management of Patients With Thoracic Aortic Disease.  Circulation. 2010; 121: O962-X528 4. Aortic Atherosclerosis (ICD10-I70.0) and Emphysema (ICD10-J43.9). Electronically Signed   By: Richardean Sale M.D.   On: 01/30/2020 20:15   DG Chest Port 1 View  Result Date: 01/30/2020 CLINICAL DATA:  Chest pain and shortness of breath EXAM: PORTABLE CHEST 1 VIEW COMPARISON:  04/04/2018 FINDINGS: Single frontal view of the chest demonstrates an unremarkable cardiac silhouette. Stable thoracic aortic ectasia and atherosclerosis. No acute airspace disease, effusion, or pneumothorax. Stable background emphysema. IMPRESSION: 1. Stable emphysema.  No acute process. Electronically Signed   By: Randa Ngo M.D.   On: 01/30/2020 18:11     Medical Consultants:    None.  Anti-Infectives:   Cefepime  Subjective:    Connor Morris he is complaining of dysuria some suprapubic tenderness,  Objective:    Vitals:   01/31/20 0015 01/31/20 0028 01/31/20 0123 01/31/20 0426  BP: (!) 104/51  (!) 108/48 (!) 149/48  Pulse: 71  63 61  Resp: (!) 21  18 16   Temp:  98.2 F (36.8 C) 98.9 F (37.2 C) 97.7 F (36.5 C)  TempSrc:  Oral Oral Oral  SpO2: 97%  100% 98%  Weight:   57.2 kg    SpO2: 98 %   Intake/Output Summary (Last 24 hours) at 01/31/2020 0811 Last data filed at 01/31/2020 0600 Gross per 24 hour  Intake 462.56 ml  Output 0 ml  Net 462.56 ml   Filed Weights   01/31/20 0123  Weight: 57.2 kg    Exam: General exam:  In no acute distress, cachectic Respiratory system: Good air movement and clear to auscultation. Cardiovascular system: S1 & S2 heard, RRR. No JVD. Gastrointestinal system: Positive bowel sounds, abdomen is soft with suprapubic tenderness no rebound or guarding Extremities: No pedal edema. Skin: No rashes, lesions or ulcers Psychiatry: Judgement and insight appear normal.    Data Reviewed:    Labs: Basic Metabolic Panel: Recent Labs  Lab 01/30/20 1806 01/31/20 0614  NA 136 137  K 3.8 3.7  CL 102 101  CO2 23 24   GLUCOSE 147* 109*  BUN 14 11  CREATININE 1.16 1.06  CALCIUM 9.5 8.9   GFR Estimated Creatinine Clearance: 42 mL/min (by C-G formula based on SCr of 1.06 mg/dL). Liver Function Tests: Recent Labs  Lab 01/30/20 1806 01/31/20 0614  AST 31 34  ALT 16 14  ALKPHOS 42 43  BILITOT 1.4* 1.7*  PROT 7.6 7.1  ALBUMIN 3.4* 3.1*   No results for input(s): LIPASE, AMYLASE in the last 168 hours. No results for input(s): AMMONIA in the last 168 hours. Coagulation profile Recent Labs  Lab 01/30/20 1806  INR 1.4*   COVID-19 Labs  No results for input(s): DDIMER, FERRITIN, LDH, CRP in the last 72 hours.  Lab Results  Component Value Date   SARSCOV2NAA NEGATIVE 01/30/2020   [place] NEGATIVE 10/26/2019   [place] NEGATIVE 09/17/2019    CBC: Recent Labs  Lab 01/30/20 1806 01/31/20 0614  WBC 9.7 19.0*  HGB 10.9* 12.9*  HCT 34.7* 41.0  MCV 85.0 85.2  PLT 223 236   Cardiac Enzymes: No results for input(s): CKTOTAL, CKMB, CKMBINDEX, TROPONINI in the last 168 hours. BNP (last 3 results) No results for input(s): PROBNP in the last 8760 hours. CBG: No results for input(s): GLUCAP in the last 168 hours. D-Dimer: No results for input(s): DDIMER in the last 72 hours. Hgb A1c: No results for input(s): HGBA1C in the last 72 hours. Lipid Profile: No results for input(s): CHOL, HDL, LDLCALC, TRIG, CHOLHDL, LDLDIRECT in the last 72 hours. Thyroid function studies: No results for input(s): TSH, T4TOTAL, T3FREE, THYROIDAB in the last 72 hours.  Invalid input(s): FREET3 Anemia work up: No results for input(s): VITAMINB12, FOLATE, FERRITIN, TIBC, IRON, RETICCTPCT in the last 72 hours. Sepsis Labs: Recent Labs  Lab 01/30/20 1806 01/31/20 0614  WBC 9.7 19.0*  LATICACIDVEN 1.5  --    Microbiology Recent Results (from the past 240 hour(s))  SARS Coronavirus 2 by RT PCR (hospital order, performed in [facility] hospital lab) Nasopharyngeal Nasopharyngeal Swab     Status: None    Collection Time: 01/30/20 10:23 PM   Specimen: Nasopharyngeal Swab  Result Value Ref Range Status   SARS Coronavirus 2 NEGATIVE NEGATIVE Final    Comment: (NOTE) SARS-CoV-2 target nucleic acids are NOT DETECTED.  The SARS-CoV-2 RNA is generally detectable in upper and lower respiratory specimens during the acute phase of infection. The lowest concentration of SARS-CoV-2 viral copies this assay can detect is 250 copies / mL. A negative result does not preclude SARS-CoV-2 infection and should not be used as the sole basis for treatment or other patient management decisions.  A negative result may occur with improper specimen collection / handling, submission of specimen other than nasopharyngeal swab, presence of viral mutation(s) within the areas targeted by this assay, and inadequate number of viral copies (<250 copies / mL). A negative result must be combined with clinical observations, patient history, and epidemiological information.  Fact Sheet for Patients:   StrictlyIdeas.no  Fact Sheet for Healthcare Providers: BankingDealers.co.za  This test is not yet approved or  cleared by the [place] FDA and has been authorized for detection and/or diagnosis of SARS-CoV-2 by FDA under an Emergency Use Authorization (EUA).  This EUA will remain in effect (meaning this test can be used) for the duration of the COVID-19 declaration under Section 564(b)(1) of the Act, 21 U.S.C. section 360bbb-3(b)(1), unless the authorization is terminated or revoked sooner.  Performed at [facility], [place] [address], [place], [place] [address]      Medications:   . rivaroxaban  20 mg Oral Q supper  . sodium chloride flush  3 mL Intravenous Q12H   Continuous Infusions: . sodium chloride 100 mL/hr at 01/31/20 0152  . ceFEPime (MAXIPIME) IV 2 g (01/31/20 0155)      LOS: 1 day   Charlynne Cousins  [facility]  01/31/2020,  8:11 AM

## 2020-01-31 NOTE — Progress Notes (Signed)
New Admission Note:   Arrival Method: from ED via stretcher Mental Orientation: Alert & oriented x2 Telemetry: 5M19, CCMD notified Assessment:to be completed Skin: Intact, warm and dry IV: RAC, saline locked Pain: 0/10 Tubes: None Safety Measures: Safety Fall Prevention Plan has been discussed  Admission: to be completed 5 [place] Orientation: Patient has been orientated to the room, unit and staff.   Family: none at bedside  Orders to be reviewed and implemented. Will continue to monitor the patient. Call light has been placed within reach and bed alarm has been activated.

## 2020-01-31 NOTE — Evaluation (Signed)
Physical Therapy Evaluation Patient Details Name: Doctor Sheahan MRN: [id number] DOB: December 08, 1933 Today's Date: 01/31/2020   History of Present Illness  Pt is an 86 yo male presenting with AMS, fatigue, and weakness. Upon admission, pt dx with acute toxic metabolic encephalopathy, and sepsis due to UTI. PMH includes: bladder cancers s/p transurethral resection, prostate cancer, thoracic aortic aneurysm, and DVT and PT on Xarelto.  Clinical Impression  Pt in bed upon arrival of PT, agreeable to evaluation at this time. Prior to admission the pt was living alone, independent with use of SPC for mobility, independent with ADLs, and received food dropped off by family and friends which he could then heat up for meals. The pt now presents with limitations in functional mobility, endurance, strength, and stabiltiy due to above dx, and will continue to benefit from skilled PT to address these deficits. The pt was able to complete multiple transfers during today's session and a short ambulation in his room, but remained limited by onset of dizziness with change in position (vitals below). The pt would like to work towards returning home alone, and will continue to benefit from skilled PT to maximize strength and dynamic stability to facilitate safe return to prior level of mobility and independence.   VITALS:  - sitting EOB - BP: 113/59; HR: 82bpm - standing - BP: 85/49; HR: 88bpm - standing after 3 min - BP: 90/54 - sitting EOB - 105/52     Follow Up Recommendations Home health PT;Supervision/Assistance - 24 hour (if 24/7 cannot be arranged at all through family, may need ST SNF)    Equipment Recommendations  Rolling walker with 5" wheels    Recommendations for Other Services       Precautions / Restrictions Precautions Precautions: Fall Restrictions Weight Bearing Restrictions: No      Mobility  Bed Mobility Overal bed mobility: Modified Independent             General bed mobility  comments: extra time, but pt able to come to EOB without assist  Transfers Overall transfer level: Needs assistance Equipment used: Rolling walker (2 wheeled) Transfers: Sit to/from Stand Sit to Stand: Min assist         General transfer comment: minA to power up from low surface, x4 through session. VC for hand placement  Ambulation/Gait Ambulation/Gait assistance: Min assist Gait Distance (Feet): 15 Feet Assistive device: Rolling walker (2 wheeled) Gait Pattern/deviations: Step-through pattern;Decreased stride length;Drifts right/left Gait velocity: decreased Gait velocity interpretation: <1.31 ft/sec, indicative of household ambulator General Gait Details: pt with slow ambulation, short strides, increased lateral movement and reports of dizziness with ambulation. orthostatic.  Stairs            Wheelchair Mobility    Modified Rankin (Stroke Patients Only)       Balance Overall balance assessment: Needs assistance Sitting-balance support: No upper extremity supported;Feet supported Sitting balance-Leahy Scale: Good     Standing balance support: Bilateral upper extremity supported;During functional activity Standing balance-Leahy Scale: Fair Standing balance comment: can static stand with single UE support, BUE support for ambulation                             Pertinent Vitals/Pain Pain Assessment: No/denies pain    Home Living Family/patient expects to be discharged to:: Private residence Living Arrangements: Alone Available Help at Discharge: Available PRN/intermittently;Family (pt reports daughter lives in [place], but works and is not able to provide 24/7) Type of  Home: House Home Access: Stairs to enter Entrance Stairs-Rails: Psychiatric nurse of Steps: 4-5 Home Layout: One level Home Equipment: Cane - single point;Grab bars - tub/shower;Shower seat Additional Comments: pt difficult to understand at times, he may also  have a RW but I could not get a clear answer    Prior Function Level of Independence: Independent with assistive device(s)         Comments: Pt reports independent with use of SPC, reports not driving, so family/friends bring him "boxes of food" that he can heat up to eat     Hand Dominance        Extremity/Trunk Assessment   Upper Extremity Assessment Upper Extremity Assessment: Overall WFL for tasks assessed    Lower Extremity Assessment Lower Extremity Assessment: Overall WFL for tasks assessed    Cervical / Trunk Assessment Cervical / Trunk Assessment: Kyphotic  Communication   Communication: Other (comment) (mumbled, incomprehensible speech at times)  Cognition Arousal/Alertness: Awake/alert Behavior During Therapy: WFL for tasks assessed/performed Overall Cognitive Status: Difficult to assess                                 General Comments: Overal functional, pt with good command following, difficult to understand his answers at times but otherwise alert and functional      General Comments General comments (skin integrity, edema, etc.): pt reports of dizziness with upright activity, orthostatic, RN alerted    Exercises     Assessment/Plan    PT Assessment Patient needs continued PT services  PT Problem List Decreased strength;Decreased mobility;Decreased activity tolerance;Decreased balance;Decreased coordination;Decreased safety awareness       PT Treatment Interventions DME instruction;Therapeutic exercise;Gait training;Stair training;Functional mobility training;Therapeutic activities;Balance training;Patient/family education    PT Goals (Current goals can be found in the Care Plan section)  Acute Rehab PT Goals Patient Stated Goal: return home PT Goal Formulation: With patient Time For Goal Achievement: 02/14/20 Potential to Achieve Goals: Good    Frequency Min 3X/week   Barriers to discharge Decreased caregiver support pt from  home alone and reports no one to provide 24/7 supervision    Co-evaluation               AM-PAC PT "6 Clicks" Mobility  Outcome Measure Help needed turning from your back to your side while in a flat bed without using bedrails?: None Help needed moving from lying on your back to sitting on the side of a flat bed without using bedrails?: None Help needed moving to and from a bed to a chair (including a wheelchair)?: A Little Help needed standing up from a chair using your arms (e.g., wheelchair or bedside chair)?: A Little Help needed to walk in hospital room?: A Little Help needed climbing 3-5 steps with a railing? : A Little 6 Click Score: 20    End of Session Equipment Utilized During Treatment: Gait belt Activity Tolerance: Patient tolerated treatment well Patient left: in chair;with chair alarm set;with call bell/phone within reach Nurse Communication: Mobility status (orthostatics) PT Visit Diagnosis: Difficulty in walking, not elsewhere classified (R26.2)    Time: 3151-7616 PT Time Calculation (min) (ACUTE ONLY): 32 min   Charges:   PT Evaluation $PT Eval Moderate Complexity: 1 Mod PT Treatments $Gait Training: 8-22 mins        Karma Ganja, PT, DPT   Acute Rehabilitation Department Pager #: [phone]  Otho Bellows 01/31/2020, 2:19  PM

## 2020-02-01 LAB — BASIC METABOLIC PANEL
Anion gap: 9 (ref 5–15)
BUN: 14 mg/dL (ref 8–23)
CO2: 20 mmol/L — ABNORMAL LOW (ref 22–32)
Calcium: 9 mg/dL (ref 8.9–10.3)
Chloride: 107 mmol/L (ref 98–111)
Creatinine, Ser: 1.01 mg/dL (ref 0.61–1.24)
GFR calc Af Amer: >60 mL/min (ref >60)
GFR calc non Af Amer: >60 mL/min (ref >60)
Glucose, Bld: 85 mg/dL (ref 70–99)
Potassium: 3.9 mmol/L (ref 3.5–5.1)
Sodium: 136 mmol/L (ref 135–145)

## 2020-02-01 LAB — CBC
HCT: 42.8 % (ref 39.0–52.0)
Hemoglobin: 13.6 g/dL (ref 13.0–17.0)
MCH: 27.1 pg (ref 26.0–34.0)
MCHC: 31.8 g/dL (ref 30.0–36.0)
MCV: 85.4 fL (ref 80.0–100.0)
Platelets: 214 10*3/uL (ref 150–400)
RBC: 5.01 MIL/uL (ref 4.22–5.81)
RDW: 15.4 % (ref 11.5–15.5)
WBC: 13.5 10*3/uL — ABNORMAL HIGH (ref 4.0–10.5)
nRBC: 0 % (ref 0.0–0.2)

## 2020-02-01 MED ORDER — SODIUM CHLORIDE 0.9 % IV SOLN
1.0000 g | INTRAVENOUS | Status: DC
  Administered 2020-02-01 – 2020-02-04 (×4): 1 g via INTRAVENOUS
  Filled 2020-02-01 (×4): qty 1

## 2020-02-01 NOTE — Progress Notes (Signed)
Occupational Therapy Evaluation Patient Details Name: Connor Morris MRN: [id number] DOB: 09/26/1933 Today's Date: 02/01/2020    History of Present Illness Pt is an 86 yo male presenting with AMS, fatigue, and weakness. Upon admission, pt dx with acute toxic metabolic encephalopathy, and sepsis due to UTI. PMH includes: bladder cancers s/p transurethral resection, prostate cancer, thoracic aortic aneurysm, and DVT and PT on Xarelto.   Clinical Impression   Patient lives at home alone and is independent at prior level.  Uses a cane for mobility.  Today patient presenting with decreased activity tolerance, strength, balance and cognition.  He was not oriented to time and demonstrated poor problem solving and safety awareness.  Required min assist to stand and transfer to chair.  Patient needing mod assist with LB ADLs at this time and set up for UB seated ADLs.  Patient not safe to return home independently at this time, though if able to have 24 hour assist could go ome with HHOT, otherwise will need SNF.  Will continue to follow with OT acutely to address the deficits listed below.      Follow Up Recommendations  Supervision/Assistance - 24 hour;Other (comment);SNF (if patient able to have 24 hr assist at home then [facility])    Equipment Recommendations  3 in 1 bedside commode;Other (comment) (RW)    Recommendations for Other Services       Precautions / Restrictions Precautions Precautions: Fall Restrictions Weight Bearing Restrictions: No      Mobility Bed Mobility Overal bed mobility: Modified Independent                Transfers Overall transfer level: Needs assistance Equipment used: Rolling walker (2 wheeled) Transfers: Sit to/from [facility] Sit to Stand: Min assist Stand pivot transfers: Min assist            Balance Overall balance assessment: Needs assistance Sitting-balance support: No upper extremity supported;Feet supported Sitting  balance-Leahy Scale: Good     Standing balance support: Bilateral upper extremity supported;During functional activity Standing balance-Leahy Scale: Fair Standing balance comment: can static stand with single UE support, BUE support for ambulation                           ADL either performed or assessed with clinical judgement   ADL Overall ADL's : Needs assistance/impaired Eating/Feeding: Independent;Sitting   Grooming: Set up;Sitting   Upper Body Bathing: Set up;Sitting   Lower Body Bathing: Moderate assistance;Sit to/from stand   Upper Body Dressing : Set up;Sitting   Lower Body Dressing: Moderate assistance;Sit to/from stand   Toilet Transfer: Moderate assistance;RW;Stand-pivot   Toileting- Clothing Manipulation and Hygiene: Moderate assistance;Sit to/from stand       Functional mobility during ADLs: Minimal assistance;Rolling walker       Vision         Perception     Praxis      Pertinent Vitals/Pain Pain Assessment: No/denies pain     Hand Dominance Right   Extremity/Trunk Assessment Upper Extremity Assessment Upper Extremity Assessment: Generalized weakness           Communication Communication Communication: Other (comment) (Tends to mumble)   Cognition Arousal/Alertness: Awake/alert Behavior During Therapy: WFL for tasks assessed/performed Overall Cognitive Status: Difficult to assess                                 General Comments: Patient not  oriented to time "I can't think about that right now". Able to follow commands though poor safety awareness.  Problem solving poor.  Patient chose to have BM in bed instead of alerting care team for assist.   General Comments       Exercises     Shoulder Instructions      Home Living Family/patient expects to be discharged to:: Private residence Living Arrangements: Alone Available Help at Discharge: Available PRN/intermittently;Family (daughter lives close but  works) Type of Home: House Home Access: Stairs to enter Technical brewer of Steps: 4-5 Entrance Stairs-Rails: [employer]: One level         Biochemist, clinical: [place]: [place] - single point;Grab bars - tub/shower;Shower seat          Prior Functioning/Environment Level of Independence: Independent with assistive device(s)        Comments: Patient does not drive        OT Problem List: Decreased strength;Decreased activity tolerance;Impaired balance (sitting and/or standing);Decreased cognition;Decreased safety awareness      OT Treatment/Interventions: Self-care/ADL training;Therapeutic exercise;Energy conservation;Balance training;Patient/family education;Cognitive remediation/compensation;Therapeutic activities    OT Goals(Current goals can be found in the care plan section) Acute Rehab OT Goals Patient Stated Goal: return home OT Goal Formulation: With patient Time For Goal Achievement: 02/15/20 Potential to Achieve Goals: Good  OT Frequency: Min 2X/week   Barriers to D/C: Decreased caregiver support          Co-evaluation              AM-PAC OT "6 Clicks" Daily Activity     Outcome Measure Help from another person eating meals?: None Help from another person taking care of personal grooming?: A Little Help from another person toileting, which includes using toliet, bedpan, or urinal?: A Lot Help from another person bathing (including washing, rinsing, drying)?: A Lot Help from another person to put on and taking off regular upper body clothing?: A Little Help from another person to put on and taking off regular lower body clothing?: A Lot 6 Click Score: 16   End of Session Equipment Utilized During Treatment: Gait belt;Rolling walker Nurse Communication: Mobility status  Activity Tolerance: Patient tolerated treatment well Patient left: in chair;with call bell/phone within reach;with chair alarm set  OT Visit  Diagnosis: Unsteadiness on feet (R26.81);Muscle weakness (generalized) (M62.81);Other symptoms and signs involving cognitive function                Time: 1111-1135 OT Time Calculation (min): 24 min Charges:  OT General Charges $OT Visit: 1 Visit OT Evaluation $OT Eval Moderate Complexity: 1 Mod OT Treatments $Self Care/Home Management : 8-22 mins  August Luz, OTR/L   Phylliss Bob 02/01/2020, 3:10 PM

## 2020-02-01 NOTE — TOC Initial Note (Signed)
Transition of Care [facility]) - Initial/Assessment Note    Patient Details  Name: Connor Morris MRN: [id number] Date of Birth: [birth year]  Transition of Care [facility]) CM/SW Contact:    Bartholomew Crews, RN Phone Number: [phone] 02/01/2020, 5:19 PM  Clinical Narrative:                  Spoke with patient and daughter, [name], at the bedside. PTA home alone with family support. Discussed recommendations for [facility] PT - agreeable and request any help available. Patient would benefit from RN, SW, and PT. Discussed agency options. Referral accepted by [facility]. Will need HH orders for RN, PT, SW with Face to Face at time of discharge. Has cane, walker, and BSC at home. Family can provide transportation home. TOC following for transition needs.   Expected Discharge Plan: [place] Barriers to Discharge: Continued Medical Work up   Patient Goals and CMS Choice Patient states their goals for this hospitalization and ongoing recovery are:: return home CMS Medicare.gov Compare Post Acute Care list provided to:: Patient Choice offered to / list presented to : Patient, Adult Children  Expected Discharge Plan and Services Expected Discharge Plan: [place] In-house Referral: [facility] Discharge Planning Services: CM Consult Post Acute Care Choice: [place] arrangements for the past 2 months: Single Family Home                 DME Arranged: N/A DME Agency: NA       HH Arranged: RN, PT, Social Work [place] Agency: [place] Date [facility] Agency Contacted: 02/01/20 Time [place]: [phone] Representative spoke with at [place]: Melissa Arrangements/Services Living arrangements for the past 2 months: [place] Lives with:: Self Patient language and need for interpreter reviewed:: Yes Do you feel safe going back to the place where you live?: Yes      Need for Family Participation in Patient Care: Yes (Comment) Care giver support  system in place?: Yes (comment) Current home services: DME (cane, walker, bsc) Criminal Activity/Legal Involvement Pertinent to Current Situation/Hospitalization: No - Comment as needed  Activities of Daily Living      Permission Sought/Granted Permission sought to share information with : Family Supports    Share Information with NAME: Kylor Valverde     Permission granted to share info w Relationship: daughter     Emotional Assessment Appearance:: Appears stated age Attitude/Demeanor/Rapport: Engaged Affect (typically observed): Accepting Orientation: : Oriented to Self, Oriented to  Time, Oriented to Place, Oriented to Situation Alcohol / Substance Use: Not Applicable Psych Involvement: No (comment)  Admission diagnosis:  Confusion [R41.0] Acute cystitis without hematuria [N30.00] Sepsis secondary to UTI ([place]) [A41.9, N39.0] Patient Active Problem List   Diagnosis Date Noted  . Severe protein-calorie malnutrition ([place]) 01/31/2020  . Sepsis secondary to UTI ([place]) 01/30/2020  . History of pulmonary embolism 01/30/2020  . Toxic metabolic encephalopathy 39/10/90  . Bladder cancer ([place])   . Acute pulmonary embolism without acute cor pulmonale ([place]) 04/05/2018  . History of prostate cancer 04/05/2018  . Thoracic ascending aortic aneurysm ([place]) 04/05/2018  . Sinus bradycardia 09/16/2016  . Shortness of breath 09/16/2016  . Bradycardia 09/14/2016  . 1st degree AV block 09/14/2016  . Dizziness 09/14/2016   PCP:  Velna Hatchet, MD Pharmacy:   [facility] [place], [place]  [place] [address] Phone: [phone] Fax: [phone]     Social Determinants of Health (SDOH) Interventions    Readmission Risk Interventions No flowsheet data found.

## 2020-02-01 NOTE — Progress Notes (Signed)
[facility] PROGRESS NOTE    Progress Note  Connor Morris  BDZ:[id number] DOB: 17-Mar-1934 DOA: 01/30/2020 PCP: Velna Hatchet, MD     Brief Narrative:   Connor Morris is an 86 y.o. male history significant of bladder cancer status post transurethral resection, prostate cancer remotely thoracic aortic aneurysm history of DVT and PE on Xarelto presents to the ED with fatigue and generalized weakness.  Is found to be disoriented in the ED unable to walk due to his significant weakness and he also reported exertional dyspnea.  In the ED he was found to have a temperature of 102  Assessment/Plan:   Acute toxic metabolic encephalopathy: Likely due to infectious etiology, he was given Tylenol for his fevers. Mentation is much improved today encephalopathy has resolved.  Sepsis secondary to UTI: Cultures and urine cultures have remained negative till date continue IV Rocephin he has defervesced and her leukocytosis is improving. Consult physical therapy.  History of PE: CTA unremarkable continue Xarelto.  Post bladder cancer resection status post TURP on 11/03/2019 follow-up with urology upon discharge.  Ascending aortic aneurysm: CTA in the ED unremarkable.  Severe protein caloric malnutrition: Ensure therapy.    DVT prophylaxis: heparin Family Communication:none Status is: Inpatient  Remains inpatient appropriate because:Hemodynamically unstable   Dispo: The patient is from: Home              Anticipated d/c is to: SNF              Anticipated d/c date is: > 3 days              Patient currently is not medically stable to d/c.    Code Status:     Code Status Orders  (From admission, onward)         Start     Ordered   01/31/20 0122  Full code  Continuous        01/31/20 0121        Code Status History    Date Active Date Inactive Code Status Order ID Comments User Context   04/05/2018 0356 04/06/2018 1756 Full Code [id number]  Etta Quill, DO ED    Advance Care Planning Activity        IV Access:    Peripheral IV   Procedures and diagnostic studies:   CT Head Wo Contrast  Result Date: 01/30/2020 CLINICAL DATA:  [name], confusion, generalized weakness, history of prostate cancer EXAM: CT HEAD WITHOUT CONTRAST TECHNIQUE: Contiguous axial images were obtained from the base of the skull through the vertex without intravenous contrast. COMPARISON:  None. FINDINGS: Brain: Scattered hypodensities are seen throughout the periventricular white matter consistent with age-indeterminate small vessel ischemic change. No other signs of acute infarct or hemorrhage. Lateral ventricles and midline structures are unremarkable. No acute extra-axial fluid collections. No mass effect. Vascular: No hyperdense vessel or unexpected calcification. Skull: Normal. Negative for fracture or focal lesion. Sinuses/Orbits: No acute finding. Other: None. IMPRESSION: 1. Age-indeterminate small-vessel ischemic changes in the white matter, favor chronic. 2. No acute intracranial trauma. Electronically Signed   By: Randa Ngo M.D.   On: 01/30/2020 20:09   CT ANGIO CHEST PE W OR WO CONTRAST  Result Date: 01/30/2020 CLINICAL DATA:  Shortness of breath with exertion and generalized weakness since falling last week. Possible fever. EXAM: CT ANGIOGRAPHY CHEST WITH CONTRAST TECHNIQUE: Multidetector CT imaging of the chest was performed using the standard protocol during bolus administration of intravenous contrast. Multiplanar CT image reconstructions and MIPs were  obtained to evaluate the vascular anatomy. CONTRAST:  164mL OMNIPAQUE IOHEXOL 350 MG/ML SOLN COMPARISON:  Radiographs today and 04/05/2018. Chest CTA 07/16/2019. FINDINGS: Cardiovascular: The pulmonary arteries are well opacified with contrast to the level of the subsegmental branches. There is no evidence of acute pulmonary embolism. There is preferential opacification of the pulmonary arteries, and only a small  amount of aortic contrast. Grossly stable atherosclerosis of the aorta, great vessels and coronary arteries. The ascending aorta is dilated up to 4.6 cm, not significantly changed. No evidence of aortic dissection. There are prominent aortic valvular calcifications. The heart size is normal. There is no pericardial effusion. Mediastinum/Nodes: Small to moderate hiatal hernia with distal esophageal wall thickening, similar to previous study. No enlarged mediastinal, hilar or axillary lymph nodes identified. The thyroid gland and trachea demonstrate no significant findings. Lungs/Pleura: No pleural effusion or pneumothorax. Moderate centrilobular emphysema. There is minimal dependent atelectasis at both lung bases. The aeration of the lungs has improved compared with the prior study. No suspicious pulmonary nodule. Upper abdomen: No acute findings are seen within the visualized upper abdomen. There is mild reflux of contrast into the IVC and hepatic veins. Left renal cysts are grossly stable. Musculoskeletal/Chest wall: There is no chest wall mass or suspicious osseous finding. Grossly stable chronic compression deformities involving the superior endplates of L1 and L2. Review of the MIP images confirms the above findings. IMPRESSION: 1. No evidence of acute pulmonary embolism or other acute chest process. 2. Grossly stable hiatal hernia and distal esophageal wall thickening. 3. Stable 4.6 cm ascending aortic aneurysm. Recommend semi-annual imaging followup by CTA or MRA and referral to cardiothoracic surgery if not already obtained. This recommendation follows 2010 ACCF/AHA/AATS/ACR/ASA/SCA/SCAI/SIR/STS/SVM Guidelines for the Diagnosis and Management of Patients With Thoracic Aortic Disease. Circulation. 2010; 121: H086-V784 4. Aortic Atherosclerosis (ICD10-I70.0) and Emphysema (ICD10-J43.9). Electronically Signed   By: Richardean Sale M.D.   On: 01/30/2020 20:15   DG Chest Port 1 View  Result Date:  01/30/2020 CLINICAL DATA:  Chest pain and shortness of breath EXAM: PORTABLE CHEST 1 VIEW COMPARISON:  04/04/2018 FINDINGS: Single frontal view of the chest demonstrates an unremarkable cardiac silhouette. Stable thoracic aortic ectasia and atherosclerosis. No acute airspace disease, effusion, or pneumothorax. Stable background emphysema. IMPRESSION: 1. Stable emphysema.  No acute process. Electronically Signed   By: Randa Ngo M.D.   On: 01/30/2020 18:11     Medical Consultants:    None.  Anti-Infectives:   Cefepime  Subjective:    Reuel Pergola prepubic tenderness is improved, tolerating his diet he relates he feels well.  Objective:    Vitals:   01/31/20 1349 01/31/20 1815 01/31/20 2021 02/01/20 0418  BP: (!) 106/43 (!) 157/64 (!) 126/48 (!) 142/51  Pulse:  60 66 (!) 58  Resp: 20 (!) 21 18 16   Temp: 97.9 F (36.6 C) 98.9 F (37.2 C) 99.3 F (37.4 C) 98.8 F (37.1 C)  TempSrc: Oral Oral Oral Oral  SpO2: 98% 98% 98% 99%  Weight:       SpO2: 99 %   Intake/Output Summary (Last 24 hours) at 02/01/2020 0751 Last data filed at 02/01/2020 0550 Gross per 24 hour  Intake 360 ml  Output 210 ml  Net 150 ml   Filed Weights   01/31/20 0123  Weight: 57.2 kg    Exam: General exam: In no acute distress. Respiratory system: Good air movement and clear to auscultation. Cardiovascular system: S1 & S2 heard, RRR. No JVD. Gastrointestinal system: Abdomen is nondistended,  soft and nontender.  Extremities: No pedal edema. Skin: No rashes, lesions or ulcers Psychiatry: Judgement and insight appear normal.    Data Reviewed:    Labs: Basic Metabolic Panel: Recent Labs  Lab 01/30/20 1806 01/30/20 1806 01/31/20 0614 02/01/20 0627  NA 136  --  137 136  K 3.8   < > 3.7 3.9  CL 102  --  101 107  CO2 23  --  24 20*  GLUCOSE 147*  --  109* 85  BUN 14  --  11 14  CREATININE 1.16  --  1.06 1.01  CALCIUM 9.5  --  8.9 9.0   < > = values in this interval not displayed.    GFR Estimated Creatinine Clearance: 44 mL/min (by C-G formula based on SCr of 1.01 mg/dL). Liver Function Tests: Recent Labs  Lab 01/30/20 1806 01/31/20 0614  AST 31 34  ALT 16 14  ALKPHOS 42 43  BILITOT 1.4* 1.7*  PROT 7.6 7.1  ALBUMIN 3.4* 3.1*   No results for input(s): LIPASE, AMYLASE in the last 168 hours. No results for input(s): AMMONIA in the last 168 hours. Coagulation profile Recent Labs  Lab 01/30/20 1806  INR 1.4*   COVID-19 Labs  No results for input(s): DDIMER, FERRITIN, LDH, CRP in the last 72 hours.  Lab Results  Component Value Date   SARSCOV2NAA NEGATIVE 01/30/2020   [place] NEGATIVE 10/26/2019   [place] NEGATIVE 09/17/2019    CBC: Recent Labs  Lab 01/30/20 1806 01/31/20 0614 02/01/20 0627  WBC 9.7 19.0* 13.5*  HGB 10.9* 12.9* 13.6  HCT 34.7* 41.0 42.8  MCV 85.0 85.2 85.4  PLT 223 236 214   Cardiac Enzymes: No results for input(s): CKTOTAL, CKMB, CKMBINDEX, TROPONINI in the last 168 hours. BNP (last 3 results) No results for input(s): PROBNP in the last 8760 hours. CBG: No results for input(s): GLUCAP in the last 168 hours. D-Dimer: No results for input(s): DDIMER in the last 72 hours. Hgb A1c: No results for input(s): HGBA1C in the last 72 hours. Lipid Profile: No results for input(s): CHOL, HDL, LDLCALC, TRIG, CHOLHDL, LDLDIRECT in the last 72 hours. Thyroid function studies: No results for input(s): TSH, T4TOTAL, T3FREE, THYROIDAB in the last 72 hours.  Invalid input(s): FREET3 Anemia work up: No results for input(s): VITAMINB12, FOLATE, FERRITIN, TIBC, IRON, RETICCTPCT in the last 72 hours. Sepsis Labs: Recent Labs  Lab 01/30/20 1806 01/31/20 0614 02/01/20 0627  WBC 9.7 19.0* 13.5*  LATICACIDVEN 1.5  --   --    Microbiology Recent Results (from the past 240 hour(s))  Culture, blood (single) w Reflex to ID Panel     Status: None (Preliminary result)   Collection Time: 01/30/20  6:13 PM   Specimen: BLOOD RIGHT  ARM  Result Value Ref Range Status   Specimen Description BLOOD RIGHT ARM  Final   Special Requests   Final    BOTTLES DRAWN AEROBIC AND ANAEROBIC Blood Culture adequate volume   Culture   Final    NO GROWTH < 24 HOURS Performed at [facility], [place] [address] [place], [place] [address]    Report Status PENDING  Incomplete  SARS Coronavirus 2 by RT PCR (hospital order, performed in [facility] hospital lab) Nasopharyngeal Nasopharyngeal Swab     Status: None   Collection Time: 01/30/20 10:23 PM   Specimen: Nasopharyngeal Swab  Result Value Ref Range Status   SARS Coronavirus 2 NEGATIVE NEGATIVE Final    Comment: (NOTE) SARS-CoV-2 target  nucleic acids are NOT DETECTED.  The SARS-CoV-2 RNA is generally detectable in upper and lower respiratory specimens during the acute phase of infection. The lowest concentration of SARS-CoV-2 viral copies this assay can detect is 250 copies / mL. A negative result does not preclude SARS-CoV-2 infection and should not be used as the sole basis for treatment or other patient management decisions.  A negative result may occur with improper specimen collection / handling, submission of specimen other than nasopharyngeal swab, presence of viral mutation(s) within the areas targeted by this assay, and inadequate number of viral copies (<250 copies / mL). A negative result must be combined with clinical observations, patient history, and epidemiological information.  Fact Sheet for Patients:   StrictlyIdeas.no  Fact Sheet for Healthcare Providers: BankingDealers.co.za  This test is not yet approved or  cleared by the [place] FDA and has been authorized for detection and/or diagnosis of SARS-CoV-2 by FDA under an Emergency Use Authorization (EUA).  This EUA will remain in effect (meaning this test can be used) for the duration of the COVID-19 declaration under Section 564(b)(1) of the Act, 21  U.S.C. section 360bbb-3(b)(1), unless the authorization is terminated or revoked sooner.  Performed at [facility], [place] [address] [place], [place] [address]      Medications:   . rivaroxaban  20 mg Oral Q supper  . sodium chloride flush  3 mL Intravenous Q12H   Continuous Infusions: . ceFEPime (MAXIPIME) IV 2 g (01/31/20 2300)      LOS: 2 days   Charlynne Cousins  [facility]  02/01/2020, 7:51 AM

## 2020-02-01 NOTE — Progress Notes (Addendum)
Physical Therapy Treatment Patient Details Name: Connor Morris MRN: [id number] DOB: Mar 30, 1934 Today's Date: 02/01/2020    History of Present Illness Pt is an 86 yo male presenting with AMS, fatigue, and weakness. Upon admission, pt dx with acute toxic metabolic encephalopathy, and sepsis due to UTI. PMH includes: bladder cancers s/p transurethral resection, prostate cancer, thoracic aortic aneurysm, and DVT and PT on Xarelto.    PT Comments    Pt OOB in recliner upon arrival of PT, agreeable to session with goal of progressing functional mobility and activity tolerance. The pt was able to complete multiple sit-stand transfers through the session, as well as a short bout of ambulation in the room with use of RW and minA for stability, but the pt remains limited by significant dizziness and drop in BP with upright mobility. The pt was also incontinent of bowel and bladder through today's session and demos poor problem solving and safety awareness. The pt will continue to benefit from skilled PT and upright mobility to further improve activity tolerance and stability prior to d/c.   VITALS:  - sitting - BP: 134/58 - standing - BP: 99/58 *pt reports dizziness* - standing after 3 min - BP: 92/56 *pt reports dizziness*     Follow Up Recommendations  Home health PT;Supervision/Assistance - 24 hour (if 24/7 supervision cannot be arranged, pt will need SNF)     Equipment Recommendations  Rolling walker with 5" wheels    Recommendations for Other Services       Precautions / Restrictions Precautions Precautions: Fall Precaution Comments: incontinent of bowel/bladder upon standing, unaware of incontinence of bowels Restrictions Weight Bearing Restrictions: No    Mobility  Bed Mobility Overal bed mobility: Modified Independent             General bed mobility comments: pt up in recliner upon arrival of PT  Transfers Overall transfer level: Needs assistance Equipment used:  Rolling walker (2 wheeled) Transfers: Sit to/from Stand Sit to Stand: Min assist Stand pivot transfers: Min assist       General transfer comment: minA to power up, x 5 through session VC for hand placement, poor eccentric lower  Ambulation/Gait Ambulation/Gait assistance: Min assist Gait Distance (Feet): 15 Feet Assistive device: Rolling walker (2 wheeled) Gait Pattern/deviations: Step-through pattern;Decreased stride length;Drifts right/left Gait velocity: decreased Gait velocity interpretation: <1.31 ft/sec, indicative of household ambulator General Gait Details: pt with slow ambulation, short strides, increased lateral movement and reports of dizziness with ambulation. orthostatic.        Balance Overall balance assessment: Needs assistance Sitting-balance support: No upper extremity supported;Feet supported Sitting balance-Leahy Scale: Good     Standing balance support: Bilateral upper extremity supported;During functional activity Standing balance-Leahy Scale: Fair Standing balance comment: can static stand with single UE support, BUE support for ambulation                            Cognition Arousal/Alertness: Awake/alert Behavior During Therapy: WFL for tasks assessed/performed Overall Cognitive Status: Difficult to assess                                 General Comments: Pt not oriented to place or situation, uses humor to avoid questions stating "I am in the room I have been in" when asked where we are today. poor problem solving throughout. Pt needing hand-over hand assist for sequencing and frequent reminders due  to [facility] deficits         General Comments General comments (skin integrity, edema, etc.): pt reports dizziness with upright activity, orthostatic, RN notified      Pertinent Vitals/Pain Pain Assessment: No/denies pain (dizziness with prolonged standing)    Home Living Family/patient expects to be discharged to:: Private  residence Living Arrangements: Alone Available Help at Discharge: Available PRN/intermittently;Family (daughter lives close but works) Type of Home: House Home Access: Stairs to enter Entrance Stairs-Rails: [place]: One level [place]: [place] - single point;Grab bars - tub/shower;Shower seat      Prior Function Level of Independence: Independent with assistive device(s)      Comments: Patient does not drive   PT Goals (current goals can now be found in the care plan section) Acute Rehab PT Goals Patient Stated Goal: return home PT Goal Formulation: With patient Time For Goal Achievement: 02/14/20 Potential to Achieve Goals: Good Progress towards PT goals: Progressing toward goals    Frequency    Min 3X/week      PT Plan Current plan remains appropriate       AM-PAC PT "6 Clicks" Mobility   Outcome Measure  Help needed turning from your back to your side while in a flat bed without using bedrails?: None Help needed moving from lying on your back to sitting on the side of a flat bed without using bedrails?: None Help needed moving to and from a bed to a chair (including a wheelchair)?: A Little Help needed standing up from a chair using your arms (e.g., wheelchair or bedside chair)?: A Little Help needed to walk in hospital room?: A Little Help needed climbing 3-5 steps with a railing? : A Little 6 Click Score: 20    End of Session Equipment Utilized During Treatment: Gait belt Activity Tolerance: Patient tolerated treatment well Patient left: in chair;with chair alarm set;with call bell/phone within reach;with nursing/sitter in room Nurse Communication: Mobility status (orthostatic) PT Visit Diagnosis: Difficulty in walking, not elsewhere classified (R26.2)     Time: 2957-4734 PT Time Calculation (min) (ACUTE ONLY): 38 min  Charges:  $Gait Training: 8-22 mins $Therapeutic Activity: 23-37 mins                     Karma Ganja, PT, DPT   Acute  Rehabilitation Department Pager #: [phone]   Otho Bellows 02/01/2020, 3:29 PM

## 2020-02-02 LAB — BASIC METABOLIC PANEL
Anion gap: 6 (ref 5–15)
BUN: 14 mg/dL (ref 8–23)
CO2: 23 mmol/L (ref 22–32)
Calcium: 8.7 mg/dL — ABNORMAL LOW (ref 8.9–10.3)
Chloride: 106 mmol/L (ref 98–111)
Creatinine, Ser: 0.91 mg/dL (ref 0.61–1.24)
GFR calc Af Amer: >60 mL/min (ref >60)
GFR calc non Af Amer: >60 mL/min (ref >60)
Glucose, Bld: 102 mg/dL — ABNORMAL HIGH (ref 70–99)
Potassium: 3.9 mmol/L (ref 3.5–5.1)
Sodium: 135 mmol/L (ref 135–145)

## 2020-02-02 LAB — CBC
HCT: 36.4 % — ABNORMAL LOW (ref 39.0–52.0)
Hemoglobin: 11.6 g/dL — ABNORMAL LOW (ref 13.0–17.0)
MCH: 26.6 pg (ref 26.0–34.0)
MCHC: 31.9 g/dL (ref 30.0–36.0)
MCV: 83.5 fL (ref 80.0–100.0)
Platelets: 210 10*3/uL (ref 150–400)
RBC: 4.36 MIL/uL (ref 4.22–5.81)
RDW: 15.1 % (ref 11.5–15.5)
WBC: 8.6 10*3/uL (ref 4.0–10.5)
nRBC: 0 % (ref 0.0–0.2)

## 2020-02-02 LAB — MAGNESIUM: Magnesium: 1.9 mg/dL (ref 1.7–2.4)

## 2020-02-02 MED ORDER — HYDRALAZINE HCL 20 MG/ML IJ SOLN: 5.0000 mg | INTRAMUSCULAR | Status: DC | PRN

## 2020-02-02 NOTE — Progress Notes (Signed)
Patient with asymptomatic bradycardia into the 40's while sleeping. Will check electrolytes and correct as needed.

## 2020-02-02 NOTE — Progress Notes (Signed)
Rechecked BP was 111/57 at 1210, no fluid bolus given. Patient daughter [name] called for update and she wanted to talk to MD. She said that her sister is nurse Sharyn Lull). [name] was okay to call her sister due to better understanding medically. Paging Dr. Venetia Constable regarding this matter and Dr. Venetia Constable called back that pt's daughter wasn't answer the phone. Called [name] for letting her know this matter, but she didn't answer the phone, left message what happened MD called her sister. HS [employer]

## 2020-02-02 NOTE — Progress Notes (Signed)
At times SB (40's -50's) with asymptomatic. Hypotensive in the morning, 91/43(58) at 0940, rechecked with small cuff 91/50(65) at 1020. Continue to monitor pt's condition. HS [employer]

## 2020-02-02 NOTE — Progress Notes (Signed)
[facility] PROGRESS NOTE    Progress Note  Connor Morris  WLN:[id number] DOB: 1933/10/19 DOA: 01/30/2020 PCP: Velna Hatchet, MD     Brief Narrative:   Connor Morris is an 86 y.o. male history significant of bladder cancer status post transurethral resection, prostate cancer remotely thoracic aortic aneurysm history of DVT and PE on Xarelto presents to the ED with fatigue and generalized weakness.  Is found to be disoriented in the ED unable to walk due to his significant weakness and he also reported exertional dyspnea.  In the ED he was found to have a temperature of 102  Assessment/Plan:   Acute toxic metabolic encephalopathy: Likely due to infectious etiology. Cephalopathy has resolved.  Sepsis secondary to UTI: Continue IV Rocephin cultures are pending. Sickle therapy recommended skilled nursing facility.  History of PE: CTA unremarkable continue Xarelto.  Post bladder cancer resection status post TURP on 11/03/2019 follow-up with urology upon discharge.  Ascending aortic aneurysm: CTA in the ED unremarkable.  Severe protein caloric malnutrition: Ensure therapy.    DVT prophylaxis: heparin Family Communication:none Status is: Inpatient  Remains inpatient appropriate because:Hemodynamically unstable   Dispo: The patient is from: Home              Anticipated d/c is to: SNF              Anticipated d/c date is: 2 days              Patient currently is medically stable to d/c.    Code Status:     Code Status Orders  (From admission, onward)         Start     Ordered   01/31/20 0122  Full code  Continuous        01/31/20 0121        Code Status History    Date Active Date Inactive Code Status Order ID Comments User Context   04/05/2018 0356 04/06/2018 1756 Full Code [id number]  Etta Quill, DO ED   Advance Care Planning Activity        IV Access:    Peripheral IV   Procedures and diagnostic studies:   No results  found.   Medical Consultants:    None.  Anti-Infectives:   Rocephin  Subjective:    Connor Morris relates his suprapubic tenderness is resolved.  Objective:    Vitals:   02/01/20 1728 02/01/20 2033 02/01/20 2100 02/02/20 0418  BP: (!) 122/51 (!) 115/52  (!) 171/56  Pulse: 66 60  (!) 54  Resp: 18 14  16   Temp: 98.1 F (36.7 C) 99 F (37.2 C)  (!) 97.5 F (36.4 C)  TempSrc: Oral Oral  Oral  SpO2: 97% 92%  100%  Weight:   69.4 kg    SpO2: 100 %   Intake/Output Summary (Last 24 hours) at 02/02/2020 0700 Last data filed at 02/02/2020 0600 Gross per 24 hour  Intake 540.01 ml  Output 200 ml  Net 340.01 ml   Filed Weights   01/31/20 0123 02/01/20 2100  Weight: 57.2 kg 69.4 kg    Exam: General exam: In no acute distress. Respiratory system: Good air movement and clear to auscultation. Cardiovascular system: S1 & S2 heard, RRR. No JVD. Gastrointestinal system: Abdomen is nondistended, soft and nontender.  Extremities: No pedal edema. Skin: No rashes, lesions or ulcers Psychiatry: Judgement and insight appear normal. Mood & affect appropriate.   Data Reviewed:    Labs: Basic Metabolic Panel: Recent Labs  Lab 01/30/20 1806 01/30/20 1806 01/31/20 0614 01/31/20 0614 02/01/20 0627 02/02/20 0322  NA 136  --  137  --  136 135  K 3.8   < > 3.7   < > 3.9 3.9  CL 102  --  101  --  107 106  CO2 23  --  24  --  20* 23  GLUCOSE 147*  --  109*  --  85 102*  BUN 14  --  11  --  14 14  CREATININE 1.16  --  1.06  --  1.01 0.91  CALCIUM 9.5  --  8.9  --  9.0 8.7*   < > = values in this interval not displayed.   GFR Estimated Creatinine Clearance: 58.5 mL/min (by C-G formula based on SCr of 0.91 mg/dL). Liver Function Tests: Recent Labs  Lab 01/30/20 1806 01/31/20 0614  AST 31 34  ALT 16 14  ALKPHOS 42 43  BILITOT 1.4* 1.7*  PROT 7.6 7.1  ALBUMIN 3.4* 3.1*   No results for input(s): LIPASE, AMYLASE in the last 168 hours. No results for input(s):  AMMONIA in the last 168 hours. Coagulation profile Recent Labs  Lab 01/30/20 1806  INR 1.4*   COVID-19 Labs  No results for input(s): DDIMER, FERRITIN, LDH, CRP in the last 72 hours.  Lab Results  Component Value Date   SARSCOV2NAA NEGATIVE 01/30/2020   [place] NEGATIVE 10/26/2019   [place] NEGATIVE 09/17/2019    CBC: Recent Labs  Lab 01/30/20 1806 01/31/20 0614 02/01/20 0627 02/02/20 0322  WBC 9.7 19.0* 13.5* 8.6  HGB 10.9* 12.9* 13.6 11.6*  HCT 34.7* 41.0 42.8 36.4*  MCV 85.0 85.2 85.4 83.5  PLT 223 236 214 210   Cardiac Enzymes: No results for input(s): CKTOTAL, CKMB, CKMBINDEX, TROPONINI in the last 168 hours. BNP (last 3 results) No results for input(s): PROBNP in the last 8760 hours. CBG: No results for input(s): GLUCAP in the last 168 hours. D-Dimer: No results for input(s): DDIMER in the last 72 hours. Hgb A1c: No results for input(s): HGBA1C in the last 72 hours. Lipid Profile: No results for input(s): CHOL, HDL, LDLCALC, TRIG, CHOLHDL, LDLDIRECT in the last 72 hours. Thyroid function studies: No results for input(s): TSH, T4TOTAL, T3FREE, THYROIDAB in the last 72 hours.  Invalid input(s): FREET3 Anemia work up: No results for input(s): VITAMINB12, FOLATE, FERRITIN, TIBC, IRON, RETICCTPCT in the last 72 hours. Sepsis Labs: Recent Labs  Lab 01/30/20 1806 01/31/20 0614 02/01/20 0627 02/02/20 0322  WBC 9.7 19.0* 13.5* 8.6  LATICACIDVEN 1.5  --   --   --    Microbiology Recent Results (from the past 240 hour(s))  Culture, blood (single) w Reflex to ID Panel     Status: None (Preliminary result)   Collection Time: 01/30/20  6:13 PM   Specimen: BLOOD RIGHT ARM  Result Value Ref Range Status   Specimen Description BLOOD RIGHT ARM  Final   Special Requests   Final    BOTTLES DRAWN AEROBIC AND ANAEROBIC Blood Culture adequate volume   Culture   Final    NO GROWTH 2 DAYS Performed at [facility], [place] [address], [place], [place]  [address]    Report Status PENDING  Incomplete  SARS Coronavirus 2 by RT PCR (hospital order, performed in [facility] hospital lab) Nasopharyngeal Nasopharyngeal Swab     Status: None   Collection Time: 01/30/20 10:23 PM   Specimen: Nasopharyngeal Swab  Result Value Ref Range Status  SARS Coronavirus 2 NEGATIVE NEGATIVE Final    Comment: (NOTE) SARS-CoV-2 target nucleic acids are NOT DETECTED.  The SARS-CoV-2 RNA is generally detectable in upper and lower respiratory specimens during the acute phase of infection. The lowest concentration of SARS-CoV-2 viral copies this assay can detect is 250 copies / mL. A negative result does not preclude SARS-CoV-2 infection and should not be used as the sole basis for treatment or other patient management decisions.  A negative result may occur with improper specimen collection / handling, submission of specimen other than nasopharyngeal swab, presence of viral mutation(s) within the areas targeted by this assay, and inadequate number of viral copies (<250 copies / mL). A negative result must be combined with clinical observations, patient history, and epidemiological information.  Fact Sheet for Patients:   StrictlyIdeas.no  Fact Sheet for Healthcare Providers: BankingDealers.co.za  This test is not yet approved or  cleared by the [place] FDA and has been authorized for detection and/or diagnosis of SARS-CoV-2 by FDA under an Emergency Use Authorization (EUA).  This EUA will remain in effect (meaning this test can be used) for the duration of the COVID-19 declaration under Section 564(b)(1) of the Act, 21 U.S.C. section 360bbb-3(b)(1), unless the authorization is terminated or revoked sooner.  Performed at [facility], [place] [address] [place], [place] [address]      Medications:   . rivaroxaban  20 mg Oral Q supper  . sodium chloride flush  3 mL Intravenous Q12H   Continuous  Infusions: . cefTRIAXone (ROCEPHIN)  IV 1 g (02/01/20 1323)      LOS: 3 days   Connor Morris  [facility]  02/02/2020, 7:00 AM

## 2020-02-03 LAB — BASIC METABOLIC PANEL
Anion gap: 8 (ref 5–15)
BUN: 12 mg/dL (ref 8–23)
CO2: 24 mmol/L (ref 22–32)
Calcium: 8.8 mg/dL — ABNORMAL LOW (ref 8.9–10.3)
Chloride: 105 mmol/L (ref 98–111)
Creatinine, Ser: 0.97 mg/dL (ref 0.61–1.24)
GFR calc Af Amer: >60 mL/min (ref >60)
GFR calc non Af Amer: >60 mL/min (ref >60)
Glucose, Bld: 86 mg/dL (ref 70–99)
Potassium: 4.2 mmol/L (ref 3.5–5.1)
Sodium: 137 mmol/L (ref 135–145)

## 2020-02-03 LAB — CBC
HCT: 38.2 % — ABNORMAL LOW (ref 39.0–52.0)
Hemoglobin: 12 g/dL — ABNORMAL LOW (ref 13.0–17.0)
MCH: 26.5 pg (ref 26.0–34.0)
MCHC: 31.4 g/dL (ref 30.0–36.0)
MCV: 84.5 fL (ref 80.0–100.0)
Platelets: 251 10*3/uL (ref 150–400)
RBC: 4.52 MIL/uL (ref 4.22–5.81)
RDW: 15 % (ref 11.5–15.5)
WBC: 7.4 10*3/uL (ref 4.0–10.5)
nRBC: 0 % (ref 0.0–0.2)

## 2020-02-03 NOTE — Progress Notes (Signed)
[facility] PROGRESS NOTE    Progress Note  Connor Morris  MEQ:[id number] DOB: 01-Apr-1934 DOA: 01/30/2020 PCP: Velna Hatchet, MD     Brief Narrative:   Connor Morris is an 86 y.o. male history significant of bladder cancer status post transurethral resection, prostate cancer remotely thoracic aortic aneurysm history of DVT and PE on Xarelto presents to the ED with fatigue and generalized weakness.  Is found to be disoriented in the ED unable to walk due to his significant weakness and he also reported exertional dyspnea.  In the ED he was found to have a temperature of 102  Assessment/Plan:   Acute toxic metabolic encephalopathy: Likely due to infectious etiology. Cephalopathy has resolved.  Sepsis secondary to UTI: Continue IV Rocephin cultures are pending. Has remained afebrile leukocytosis resolved culture data is pending. Physical therapy evaluated the patient and recommended home health PT.  History of PE: CTA unremarkable continue Xarelto.  Post bladder cancer resection status post TURP on 11/03/2019 follow-up with urology upon discharge.  Ascending aortic aneurysm: CTA in the ED unremarkable.  Severe protein caloric malnutrition: Ensure therapy.    DVT prophylaxis: heparin Family Communication:none Status is: Inpatient  Remains inpatient appropriate because:Hemodynamically unstable   Dispo: The patient is from: Home              Anticipated d/c is to: Home              Anticipated d/c date is: 1 days              Patient currently is medically stable to d/c.    Code Status:     Code Status Orders  (From admission, onward)         Start     Ordered   01/31/20 0122  Full code  Continuous        01/31/20 0121        Code Status History    Date Active Date Inactive Code Status Order ID Comments User Context   04/05/2018 0356 04/06/2018 1756 Full Code [id number]  Etta Quill, DO ED   Advance Care Planning Activity        IV  Access:    Peripheral IV   Procedures and diagnostic studies:   No results found.   Medical Consultants:    None.  Anti-Infectives:   Rocephin  Subjective:    Connor Morris. Suprapubic tenderness is resolved tolerating his diet no new complaints.  Objective:    Vitals:   02/02/20 1619 02/02/20 2257 02/03/20 0539 02/03/20 0541  BP: (!) 105/57 (!) 107/54 (!) 171/62 (!) 168/67  Pulse: 66 60 (!) 58 (!) 58  Resp:  20 18   Temp: 99.1 F (37.3 C) 98.7 F (37.1 C) 98.3 F (36.8 C)   TempSrc: Oral Oral    SpO2: 100% 98% 97%   Weight:       SpO2: 97 %   Intake/Output Summary (Last 24 hours) at 02/03/2020 0828 Last data filed at 02/02/2020 1700 Gross per 24 hour  Intake 803 ml  Output 500 ml  Net 303 ml   Filed Weights   01/31/20 0123 02/01/20 2100  Weight: 57.2 kg 69.4 kg    Exam: General exam: In no acute distress. Respiratory system: Good air movement and clear to auscultation. Cardiovascular system: S1 & S2 heard, RRR. No JVD. Gastrointestinal system: Abdomen is nondistended, soft and nontender.  Extremities: No pedal edema. Skin: No rashes, lesions or ulcers   Data Reviewed:  Labs: Basic Metabolic Panel: Recent Labs  Lab 01/30/20 1806 01/30/20 1806 01/31/20 5784 01/31/20 6962 02/01/20 0627 02/01/20 0627 02/02/20 0322 02/03/20 0435  NA 136  --  137  --  136  --  135 137  K 3.8   < > 3.7   < > 3.9   < > 3.9 4.2  CL 102  --  101  --  107  --  106 105  CO2 23  --  24  --  20*  --  23 24  GLUCOSE 147*  --  109*  --  85  --  102* 86  BUN 14  --  11  --  14  --  14 12  CREATININE 1.16  --  1.06  --  1.01  --  0.91 0.97  CALCIUM 9.5  --  8.9  --  9.0  --  8.7* 8.8*  MG  --   --   --   --   --   --  1.9  --    < > = values in this interval not displayed.   GFR Estimated Creatinine Clearance: 54.8 mL/min (by C-G formula based on SCr of 0.97 mg/dL). Liver Function Tests: Recent Labs  Lab 01/30/20 1806 01/31/20 0614  AST 31 34  ALT 16  14  ALKPHOS 42 43  BILITOT 1.4* 1.7*  PROT 7.6 7.1  ALBUMIN 3.4* 3.1*   No results for input(s): LIPASE, AMYLASE in the last 168 hours. No results for input(s): AMMONIA in the last 168 hours. Coagulation profile Recent Labs  Lab 01/30/20 1806  INR 1.4*   COVID-19 Labs  No results for input(s): DDIMER, FERRITIN, LDH, CRP in the last 72 hours.  Lab Results  Component Value Date   SARSCOV2NAA NEGATIVE 01/30/2020   [place] NEGATIVE 10/26/2019   [place] NEGATIVE 09/17/2019    CBC: Recent Labs  Lab 01/30/20 1806 01/31/20 0614 02/01/20 0627 02/02/20 0322 02/03/20 0435  WBC 9.7 19.0* 13.5* 8.6 7.4  HGB 10.9* 12.9* 13.6 11.6* 12.0*  HCT 34.7* 41.0 42.8 36.4* 38.2*  MCV 85.0 85.2 85.4 83.5 84.5  PLT 223 236 214 210 251   Cardiac Enzymes: No results for input(s): CKTOTAL, CKMB, CKMBINDEX, TROPONINI in the last 168 hours. BNP (last 3 results) No results for input(s): PROBNP in the last 8760 hours. CBG: No results for input(s): GLUCAP in the last 168 hours. D-Dimer: No results for input(s): DDIMER in the last 72 hours. Hgb A1c: No results for input(s): HGBA1C in the last 72 hours. Lipid Profile: No results for input(s): CHOL, HDL, LDLCALC, TRIG, CHOLHDL, LDLDIRECT in the last 72 hours. Thyroid function studies: No results for input(s): TSH, T4TOTAL, T3FREE, THYROIDAB in the last 72 hours.  Invalid input(s): FREET3 Anemia work up: No results for input(s): VITAMINB12, FOLATE, FERRITIN, TIBC, IRON, RETICCTPCT in the last 72 hours. Sepsis Labs: Recent Labs  Lab 01/30/20 1806 01/30/20 1806 01/31/20 0614 02/01/20 0627 02/02/20 0322 02/03/20 0435  WBC 9.7   < > 19.0* 13.5* 8.6 7.4  LATICACIDVEN 1.5  --   --   --   --   --    < > = values in this interval not displayed.   Microbiology Recent Results (from the past 240 hour(s))  Culture, blood (single) w Reflex to ID Panel     Status: None (Preliminary result)   Collection Time: 01/30/20  6:13 PM    Specimen: BLOOD RIGHT ARM  Result Value Ref Range Status   Specimen Description  BLOOD RIGHT ARM  Final   Special Requests   Final    BOTTLES DRAWN AEROBIC AND ANAEROBIC Blood Culture adequate volume   Culture   Final    NO GROWTH 4 DAYS Performed at [facility], [address] [place], [place] [address]    Report Status PENDING  Incomplete  SARS Coronavirus 2 by RT PCR (hospital order, performed in [facility] hospital lab) Nasopharyngeal Nasopharyngeal Swab     Status: None   Collection Time: 01/30/20 10:23 PM   Specimen: Nasopharyngeal Swab  Result Value Ref Range Status   SARS Coronavirus 2 NEGATIVE NEGATIVE Final    Comment: (NOTE) SARS-CoV-2 target nucleic acids are NOT DETECTED.  The SARS-CoV-2 RNA is generally detectable in upper and lower respiratory specimens during the acute phase of infection. The lowest concentration of SARS-CoV-2 viral copies this assay can detect is 250 copies / mL. A negative result does not preclude SARS-CoV-2 infection and should not be used as the sole basis for treatment or other patient management decisions.  A negative result may occur with improper specimen collection / handling, submission of specimen other than nasopharyngeal swab, presence of viral mutation(s) within the areas targeted by this assay, and inadequate number of viral copies (<250 copies / mL). A negative result must be combined with clinical observations, patient history, and epidemiological information.  Fact Sheet for Patients:   StrictlyIdeas.no  Fact Sheet for Healthcare Providers: BankingDealers.co.za  This test is not yet approved or  cleared by the [place] FDA and has been authorized for detection and/or diagnosis of SARS-CoV-2 by FDA under an Emergency Use Authorization (EUA).  This EUA will remain in effect (meaning this test can be used) for the duration of the COVID-19 declaration under Section  564(b)(1) of the Act, 21 U.S.C. section 360bbb-3(b)(1), unless the authorization is terminated or revoked sooner.  Performed at [facility], [place] [address], [place], [place] [address]      Medications:   . rivaroxaban  20 mg Oral Q supper  . sodium chloride flush  3 mL Intravenous Q12H   Continuous Infusions: . cefTRIAXone (ROCEPHIN)  IV 1 g (02/02/20 1113)      LOS: 4 days   Connor Morris  [facility]  02/03/2020, 8:28 AM

## 2020-02-04 LAB — CULTURE, BLOOD (SINGLE)
Culture: NO GROWTH
Special Requests: ADEQUATE

## 2020-02-04 MED ORDER — SODIUM CHLORIDE 0.9 % IV BOLUS
1000.0000 mL | Freq: Once | INTRAVENOUS | Status: AC
  Administered 2020-02-04: 1000 mL via INTRAVENOUS

## 2020-02-04 MED ORDER — CEPHALEXIN 500 MG PO CAPS: 500.0000 mg | ORAL_CAPSULE | Freq: Four times a day (QID) | ORAL | 0 refills | Status: AC

## 2020-02-04 NOTE — Discharge Summary (Signed)
Physician Discharge Summary  Connor Morris QIW:[id number] DOB: 30-Aug-1933 DOA: 01/30/2020  PCP: Velna Hatchet, MD  Admit date: 01/30/2020 Discharge date: 02/04/2020  Admitted From: Home Disposition:  Home  Recommendations for Outpatient Follow-up:  1. Follow up with PCP in 1-2 weeks   Home Health:Yes Equipment/Devices:None  Discharge Condition:Stable CODE STATUS:Full Diet recommendation: Heart Healthy  Brief/Interim Summary: 86 y.o. male history significant of bladder cancer status post transurethral resection, prostate cancer remotely thoracic aortic aneurysm history of DVT and PE on Xarelto presents to the ED with fatigue and generalized weakness.  Is found to be disoriented in the ED   Discharge Diagnoses:  Principal Problem:   Sepsis secondary to UTI [facility]) Active Problems:   Thoracic ascending aortic aneurysm ([place])   History of pulmonary embolism   Toxic metabolic encephalopathy   Bladder cancer ([place])   Severe protein-calorie malnutrition ([place])  Acute toxic encephalopathy: Likely due to infectious etiology resolved with below treatment.  Sepsis secondary to UTI: He was started on IV Rocephin urine cultures and blood cultures remain inconclusive he defervesced and leukocytosis resolved. He will go home on 1 additional day of Keflex. Physical therapy evaluated the patient and recommended home health PT.  History of PE: CT angio of the chest was unremarkable no changes made to his Xarelto.  Post bladder cancer resection status post TURP: Follow-up with urology upon discharge.  Ascending aortic aneurysm: Noted CTA unchanged.  Severe protein caloric malnutrition will continue Ensure 3 times daily.    Discharge Instructions  Discharge Instructions    Diet - low sodium heart healthy   Complete by: As directed    Increase activity slowly   Complete by: As directed      Allergies as of 02/04/2020   No Known Allergies     Medication List    TAKE these  medications   multivitamin with minerals tablet Take 1 tablet by mouth daily.   traMADol 50 MG tablet Commonly known as: Ultram Take 1 tablet (50 mg total) by mouth every 6 (six) hours as needed for moderate pain.   Xarelto 20 MG Tabs tablet Generic drug: rivaroxaban Take 20 mg by mouth daily.       No Known Allergies  Consultations:  None   Procedures/Studies: CT Head Wo Contrast  Result Date: 01/30/2020 CLINICAL DATA:  [name], confusion, generalized weakness, history of prostate cancer EXAM: CT HEAD WITHOUT CONTRAST TECHNIQUE: Contiguous axial images were obtained from the base of the skull through the vertex without intravenous contrast. COMPARISON:  None. FINDINGS: Brain: Scattered hypodensities are seen throughout the periventricular white matter consistent with age-indeterminate small vessel ischemic change. No other signs of acute infarct or hemorrhage. Lateral ventricles and midline structures are unremarkable. No acute extra-axial fluid collections. No mass effect. Vascular: No hyperdense vessel or unexpected calcification. Skull: Normal. Negative for fracture or focal lesion. Sinuses/Orbits: No acute finding. Other: None. IMPRESSION: 1. Age-indeterminate small-vessel ischemic changes in the white matter, favor chronic. 2. No acute intracranial trauma. Electronically Signed   By: Randa Ngo M.D.   On: 01/30/2020 20:09   CT ANGIO CHEST PE W OR WO CONTRAST  Result Date: 01/30/2020 CLINICAL DATA:  Shortness of breath with exertion and generalized weakness since falling last week. Possible fever. EXAM: CT ANGIOGRAPHY CHEST WITH CONTRAST TECHNIQUE: Multidetector CT imaging of the chest was performed using the standard protocol during bolus administration of intravenous contrast. Multiplanar CT image reconstructions and MIPs were obtained to evaluate the vascular anatomy. CONTRAST:  16mL OMNIPAQUE IOHEXOL 350 MG/ML  SOLN COMPARISON:  Radiographs today and 04/05/2018. Chest CTA  07/16/2019. FINDINGS: Cardiovascular: The pulmonary arteries are well opacified with contrast to the level of the subsegmental branches. There is no evidence of acute pulmonary embolism. There is preferential opacification of the pulmonary arteries, and only a small amount of aortic contrast. Grossly stable atherosclerosis of the aorta, great vessels and coronary arteries. The ascending aorta is dilated up to 4.6 cm, not significantly changed. No evidence of aortic dissection. There are prominent aortic valvular calcifications. The heart size is normal. There is no pericardial effusion. Mediastinum/Nodes: Small to moderate hiatal hernia with distal esophageal wall thickening, similar to previous study. No enlarged mediastinal, hilar or axillary lymph nodes identified. The thyroid gland and trachea demonstrate no significant findings. Lungs/Pleura: No pleural effusion or pneumothorax. Moderate centrilobular emphysema. There is minimal dependent atelectasis at both lung bases. The aeration of the lungs has improved compared with the prior study. No suspicious pulmonary nodule. Upper abdomen: No acute findings are seen within the visualized upper abdomen. There is mild reflux of contrast into the IVC and hepatic veins. Left renal cysts are grossly stable. Musculoskeletal/Chest wall: There is no chest wall mass or suspicious osseous finding. Grossly stable chronic compression deformities involving the superior endplates of L1 and L2. Review of the MIP images confirms the above findings. IMPRESSION: 1. No evidence of acute pulmonary embolism or other acute chest process. 2. Grossly stable hiatal hernia and distal esophageal wall thickening. 3. Stable 4.6 cm ascending aortic aneurysm. Recommend semi-annual imaging followup by CTA or MRA and referral to cardiothoracic surgery if not already obtained. This recommendation follows 2010 ACCF/AHA/AATS/ACR/ASA/SCA/SCAI/SIR/STS/SVM Guidelines for the Diagnosis and Management of  Patients With Thoracic Aortic Disease. Circulation. 2010; 121: Z662-H476 4. Aortic Atherosclerosis (ICD10-I70.0) and Emphysema (ICD10-J43.9). Electronically Signed   By: Richardean Sale M.D.   On: 01/30/2020 20:15   DG Chest Port 1 View  Result Date: 01/30/2020 CLINICAL DATA:  Chest pain and shortness of breath EXAM: PORTABLE CHEST 1 VIEW COMPARISON:  04/04/2018 FINDINGS: Single frontal view of the chest demonstrates an unremarkable cardiac silhouette. Stable thoracic aortic ectasia and atherosclerosis. No acute airspace disease, effusion, or pneumothorax. Stable background emphysema. IMPRESSION: 1. Stable emphysema.  No acute process. Electronically Signed   By: Randa Ngo M.D.   On: 01/30/2020 18:11    (Echo, Carotid, EGD, Colonoscopy, ERCP)    Subjective: No complaints feels great.  Discharge Exam: Vitals:   02/04/20 0527 02/04/20 0906  BP: 139/60 140/72  Pulse: 62 78  Resp: 16 18  Temp: 98.5 F (36.9 C) 98 F (36.7 C)  SpO2: 100% 98%   Vitals:   02/03/20 1000 02/03/20 2024 02/04/20 0527 02/04/20 0906  BP: 127/71 (!) 104/58 139/60 140/72  Pulse: 68 83 62 78  Resp: 20 15 16 18   Temp: 98.4 F (36.9 C) 98.6 F (37 C) 98.5 F (36.9 C) 98 F (36.7 C)  TempSrc: Oral  Oral Oral  SpO2: 100% 99% 100% 98%  Weight:  69.4 kg      General: Pt is alert, awake, not in acute distress Cardiovascular: RRR, S1/S2 +, no rubs, no gallops Respiratory: CTA bilaterally, no wheezing, no rhonchi Abdominal: Soft, NT, ND, bowel sounds + Extremities: no edema, no cyanosis    The results of significant diagnostics from this hospitalization (including imaging, microbiology, ancillary and laboratory) are listed below for reference.     Microbiology: Recent Results (from the past 240 hour(s))  Culture, blood (single) w Reflex to ID Panel  Status: None   Collection Time: 01/30/20  6:13 PM   Specimen: BLOOD RIGHT ARM  Result Value Ref Range Status   Specimen Description BLOOD RIGHT ARM   Final   Special Requests   Final    BOTTLES DRAWN AEROBIC AND ANAEROBIC Blood Culture adequate volume   Culture   Final    NO GROWTH 5 DAYS Performed at [facility], [address] [place], [place] [address]    Report Status 02/04/2020 FINAL  Final  SARS Coronavirus 2 by RT PCR (hospital order, performed in [facility] hospital lab) Nasopharyngeal Nasopharyngeal Swab     Status: None   Collection Time: 01/30/20 10:23 PM   Specimen: Nasopharyngeal Swab  Result Value Ref Range Status   SARS Coronavirus 2 NEGATIVE NEGATIVE Final    Comment: (NOTE) SARS-CoV-2 target nucleic acids are NOT DETECTED.  The SARS-CoV-2 RNA is generally detectable in upper and lower respiratory specimens during the acute phase of infection. The lowest concentration of SARS-CoV-2 viral copies this assay can detect is 250 copies / mL. A negative result does not preclude SARS-CoV-2 infection and should not be used as the sole basis for treatment or other patient management decisions.  A negative result may occur with improper specimen collection / handling, submission of specimen other than nasopharyngeal swab, presence of viral mutation(s) within the areas targeted by this assay, and inadequate number of viral copies (<250 copies / mL). A negative result must be combined with clinical observations, patient history, and epidemiological information.  Fact Sheet for Patients:   StrictlyIdeas.no  Fact Sheet for Healthcare Providers: BankingDealers.co.za  This test is not yet approved or  cleared by the [place] FDA and has been authorized for detection and/or diagnosis of SARS-CoV-2 by FDA under an Emergency Use Authorization (EUA).  This EUA will remain in effect (meaning this test can be used) for the duration of the COVID-19 declaration under Section 564(b)(1) of the Act, 21 U.S.C. section 360bbb-3(b)(1), unless the authorization is terminated  or revoked sooner.  Performed at [facility], [place] [address] [place], [place] [address]      Labs: BNP (last 3 results) Recent Labs    01/30/20 1806  BNP 024.0*   Basic Metabolic Panel: Recent Labs  Lab 01/30/20 1806 01/31/20 0614 02/01/20 0627 02/02/20 0322 02/03/20 0435  NA 136 137 136 135 137  K 3.8 3.7 3.9 3.9 4.2  CL 102 101 107 106 105  CO2 23 24 20* 23 24  GLUCOSE 147* 109* 85 102* 86  BUN 14 11 14 14 12   CREATININE 1.16 1.06 1.01 0.91 0.97  CALCIUM 9.5 8.9 9.0 8.7* 8.8*  MG  --   --   --  1.9  --    Liver Function Tests: Recent Labs  Lab 01/30/20 1806 01/31/20 0614  AST 31 34  ALT 16 14  ALKPHOS 42 43  BILITOT 1.4* 1.7*  PROT 7.6 7.1  ALBUMIN 3.4* 3.1*   No results for input(s): LIPASE, AMYLASE in the last 168 hours. No results for input(s): AMMONIA in the last 168 hours. CBC: Recent Labs  Lab 01/30/20 1806 01/31/20 0614 02/01/20 0627 02/02/20 0322 02/03/20 0435  WBC 9.7 19.0* 13.5* 8.6 7.4  HGB 10.9* 12.9* 13.6 11.6* 12.0*  HCT 34.7* 41.0 42.8 36.4* 38.2*  MCV 85.0 85.2 85.4 83.5 84.5  PLT 223 236 214 210 251   Cardiac Enzymes: No results for input(s): CKTOTAL, CKMB, CKMBINDEX, TROPONINI in the last 168 hours. BNP:  Invalid input(s): POCBNP CBG: No results for input(s): GLUCAP in the last 168 hours. D-Dimer No results for input(s): DDIMER in the last 72 hours. Hgb A1c No results for input(s): HGBA1C in the last 72 hours. Lipid Profile No results for input(s): CHOL, HDL, LDLCALC, TRIG, CHOLHDL, LDLDIRECT in the last 72 hours. Thyroid function studies No results for input(s): TSH, T4TOTAL, T3FREE, THYROIDAB in the last 72 hours.  Invalid input(s): FREET3 Anemia work up No results for input(s): VITAMINB12, FOLATE, FERRITIN, TIBC, IRON, RETICCTPCT in the last 72 hours. Urinalysis    Component Value Date/Time   COLORURINE YELLOW 01/30/2020 2035   APPEARANCEUR CLOUDY (A) 01/30/2020 2035   LABSPEC >1.046 (H) 01/30/2020 2035    PHURINE 6.0 01/30/2020 2035   GLUCOSEU NEGATIVE 01/30/2020 2035   HGBUR LARGE (A) 01/30/2020 2035   BILIRUBINUR NEGATIVE 01/30/2020 2035   KETONESUR 20 (A) 01/30/2020 2035   PROTEINUR 100 (A) 01/30/2020 2035   UROBILINOGEN 0.2 02/26/2019 1011   NITRITE NEGATIVE 01/30/2020 2035   LEUKOCYTESUR LARGE (A) 01/30/2020 2035   Sepsis Labs Invalid input(s): PROCALCITONIN,  WBC,  LACTICIDVEN Microbiology Recent Results (from the past 240 hour(s))  Culture, blood (single) w Reflex to ID Panel     Status: None   Collection Time: 01/30/20  6:13 PM   Specimen: BLOOD RIGHT ARM  Result Value Ref Range Status   Specimen Description BLOOD RIGHT ARM  Final   Special Requests   Final    BOTTLES DRAWN AEROBIC AND ANAEROBIC Blood Culture adequate volume   Culture   Final    NO GROWTH 5 DAYS Performed at [facility], [place] [address] [place],  [address]    Report Status 02/04/2020 FINAL  Final  SARS Coronavirus 2 by RT PCR (hospital order, performed in [facility] hospital lab) Nasopharyngeal Nasopharyngeal Swab     Status: None   Collection Time: 01/30/20 10:23 PM   Specimen: Nasopharyngeal Swab  Result Value Ref Range Status   SARS Coronavirus 2 NEGATIVE NEGATIVE Final    Comment: (NOTE) SARS-CoV-2 target nucleic acids are NOT DETECTED.  The SARS-CoV-2 RNA is generally detectable in upper and lower respiratory specimens during the acute phase of infection. The lowest concentration of SARS-CoV-2 viral copies this assay can detect is 250 copies / mL. A negative result does not preclude SARS-CoV-2 infection and should not be used as the sole basis for treatment or other patient management decisions.  A negative result may occur with improper specimen collection / handling, submission of specimen other than nasopharyngeal swab, presence of viral mutation(s) within the areas targeted by this assay, and inadequate number of viral copies (<250 copies / mL). A negative result must be  combined with clinical observations, patient history, and epidemiological information.  Fact Sheet for Patients:   StrictlyIdeas.no  Fact Sheet for Healthcare Providers: BankingDealers.co.za  This test is not yet approved or  cleared by the [place] FDA and has been authorized for detection and/or diagnosis of SARS-CoV-2 by FDA under an Emergency Use Authorization (EUA).  This EUA will remain in effect (meaning this test can be used) for the duration of the COVID-19 declaration under Section 564(b)(1) of the Act, 21 U.S.C. section 360bbb-3(b)(1), unless the authorization is terminated or revoked sooner.  Performed at [facility], [place] [address], [place],  [address]      Time coordinating discharge: Over 40 minutes  SIGNED:   Charlynne Cousins, MD  [facility] 02/04/2020, 10:54 AM Pager   If  7PM-7AM, please contact night-coverage [email or web address] Password [email or web address]

## 2020-02-04 NOTE — Progress Notes (Signed)
Physical Therapy Treatment Patient Details Name: Connor Morris MRN: [id number] DOB: 1934/04/01 Today's Date: 02/04/2020    History of Present Illness Pt is an 86 yo male presenting with AMS, fatigue, and weakness. Upon admission, pt dx with acute toxic metabolic encephalopathy, and sepsis due to UTI. PMH includes: bladder cancers s/p transurethral resection, prostate cancer, thoracic aortic aneurysm, and DVT and PT on Xarelto.    PT Comments    Continuing work on functional mobility and activity tolerance;  Session focused on a close look at BPs with activity; still with a BP drop between supine and standing, accompanied by dizziness; I'm encourauged that his BP rallied with amb and he was able to stand after in room walking; Notified Dr. Venetia Constable  Follow Up Recommendations  Home health PT;Supervision/Assistance - 24 hour     Equipment Recommendations  Rolling walker with 5" wheels    Recommendations for Other Services       Precautions / Restrictions Precautions Precautions: Fall Restrictions Weight Bearing Restrictions: No    Mobility  Bed Mobility               General bed mobility comments: up in recliner   Transfers Overall transfer level: Needs assistance Equipment used: Rolling walker (2 wheeled) Transfers: Sit to/from Stand Sit to Stand: Min assist Stand pivot transfers: Min guard       General transfer comment: Min A to power up   Ambulation/Gait Ambulation/Gait assistance: Min guard Gait Distance (Feet): 15 Feet Assistive device: Rolling walker (2 wheeled) Gait Pattern/deviations: Step-through pattern;Decreased stride length;Drifts right/left     General Gait Details: pt with slow ambulation, short strides, increased lateral movement and reports of dizziness with ambulation. orthostatic.   Stairs             Wheelchair Mobility    Modified Rankin (Stroke Patients Only)       Balance Overall balance assessment: Needs  assistance Sitting-balance support: Bilateral upper extremity supported;Feet supported Sitting balance-Leahy Scale: Good     Standing balance support: Bilateral upper extremity supported;During functional activity Standing balance-Leahy Scale: Fair Standing balance comment: min guard for steadying during toileting task in standing                             Cognition Arousal/Alertness: Awake/alert Behavior During Therapy: WFL for tasks assessed/performed Overall Cognitive Status: Impaired/Different from baseline Area of Impairment: Orientation;Memory;Safety/judgement                 Orientation Level: Time;Situation   Memory: Decreased short-term memory   Safety/Judgement: Decreased awareness of safety     General Comments: Pt not oriented to time fully (able to name correct month) or situation      Exercises      General Comments General comments (skin integrity, edema, etc.):   02/04/20 1100 02/04/20 1300  Vital Signs  Patient Position (if appropriate) Orthostatic Vitals  --   Orthostatic Lying   BP- Lying 143/73  --   Pulse- Lying 68  --   Orthostatic Sitting  BP- Sitting 117/67  --   Pulse- Sitting 78  --   Orthostatic Standing at 0 minutes  BP- Standing at 0 minutes 99/47 (symptomatic for dizziness)  --   Pulse- Standing at 0 minutes 123  --   Orthostatic Standing at 3 minutes  BP- Standing at 3 minutes 93/56 (symptomatic for dizziness) 115/64 (after walking in room)  Pulse- Standing at 3 minutes 80 84  Pertinent Vitals/Pain Pain Assessment: No/denies pain    Home Living                      Prior Function            PT Goals (current goals can now be found in the care plan section) Acute Rehab PT Goals Patient Stated Goal: return home PT Goal Formulation: With patient Time For Goal Achievement: 02/14/20 Potential to Achieve Goals: Good Progress towards PT goals: Progressing toward goals    Frequency     Min 3X/week      PT Plan Current plan remains appropriate    Co-evaluation              AM-PAC PT "6 Clicks" Mobility   Outcome Measure  Help needed turning from your back to your side while in a flat bed without using bedrails?: None Help needed moving from lying on your back to sitting on the side of a flat bed without using bedrails?: None Help needed moving to and from a bed to a chair (including a wheelchair)?: A Little Help needed standing up from a chair using your arms (e.g., wheelchair or bedside chair)?: A Little Help needed to walk in hospital room?: A Little Help needed climbing 3-5 steps with a railing? : A Little 6 Click Score: 20    End of Session Equipment Utilized During Treatment: Gait belt Activity Tolerance: Patient tolerated treatment well Patient left: in chair;with chair alarm set Nurse Communication: Mobility status (and serial BPs) PT Visit Diagnosis: Difficulty in walking, not elsewhere classified (R26.2)     Time: 9038-3338 PT Time Calculation (min) (ACUTE ONLY): 23 min  Charges:  $Gait Training: 8-22 mins $Therapeutic Activity: 8-22 mins                     Roney Marion, PT  Acute Rehabilitation Services Pager [phone] Office [place] 02/04/2020, 6:01 PM

## 2020-02-04 NOTE — Plan of Care (Signed)
  Problem: Activity: Goal: Risk for activity intolerance will decrease Outcome: Progressing   Problem: Coping: Goal: Level of anxiety will decrease Outcome: Progressing   

## 2020-02-04 NOTE — Progress Notes (Signed)
Occupational Therapy Treatment Patient Details Name: Connor Morris MRN: [id number] DOB: [birth year] Today's Date: 02/04/2020    History of present illness Pt is an 86 yo male presenting with AMS, fatigue, and weakness. Upon admission, pt dx with acute toxic metabolic encephalopathy, and sepsis due to UTI. PMH includes: bladder cancers s/p transurethral resection, prostate cancer, thoracic aortic aneurysm, and DVT and PT on Xarelto.   OT comments  Pt progressing towards OT goals with overall Min A at most required for mobility using RW with progression of carryover with cues for hand placement during transitional movements. Pt progressed to min guard for toileting tasks with assistance to ensure maintenance of balance in standing. Provided education on fall prevention strategies to implement in home environment with pt verbalizing understanding. Hard copy handout provided for pt/family for improved carryover. Due to unsteadiness in standing, mild confusion and pt living alone, continue to recommend 24/7 support and physical assistance at home.   Follow Up Recommendations  Supervision/Assistance - 24 hour;Other (comment);SNF (HHOT if 24/7 assistance able to be provided )    Equipment Recommendations  3 in 1 bedside commode;Other (comment) (RW)    Recommendations for Other Services      Precautions / Restrictions Precautions Precautions: Fall Restrictions Weight Bearing Restrictions: No       Mobility Bed Mobility               General bed mobility comments: up in recliner   Transfers Overall transfer level: Needs assistance Equipment used: Rolling walker (2 wheeled) Transfers: Sit to/from [facility] Sit to Stand: Min assist Stand pivot transfers: Min guard       General transfer comment: Min A to power up x 5 STS trials     Balance Overall balance assessment: Needs assistance Sitting-balance support: Bilateral upper extremity supported;Feet  supported Sitting balance-Leahy Scale: Good     Standing balance support: Bilateral upper extremity supported;During functional activity Standing balance-Leahy Scale: Fair Standing balance comment: min guard for steadying during toileting task in standing                            ADL either performed or assessed with clinical judgement   ADL Overall ADL's : Needs assistance/impaired     Grooming: Set up;Sitting Grooming Details (indicate cue type and reason): sitting to wash hands with washcloth                  Toilet Transfer: Min guard;Ambulation;Cueing for safety;Cueing for sequencing;RW;Comfort height toilet Toilet Transfer Details (indicate cue type and reason): Pt Min guard for toilet transfer with cues for hand placement and RW manuevering Toileting- Clothing Manipulation and Hygiene: Min guard;Sitting/lateral lean;Sit to/from stand Toileting - Clothing Manipulation Details (indicate cue type and reason): min guard overall to maintain balance in standing for urination w/ male purewick, as well as clothing mgmt for BM in bathroom, did require some prompts for problem solving and sequencing      Functional mobility during ADLs: Min guard;Rolling walker General ADL Comments: Overall min guard for steadying in standing with RW due to shakiness, improved follow-through for hand placement during transitional movements after cueing, cued for problem solving with pt improving awareness      Vision       Perception     Praxis      Cognition Arousal/Alertness: Awake/alert Behavior During Therapy: WFL for tasks assessed/performed Overall Cognitive Status: Impaired/Different from baseline Area of Impairment: Orientation;Memory;Safety/judgement  Orientation Level: Time;Situation   Memory: Decreased short-term memory   Safety/Judgement: Decreased awareness of safety     General Comments: Pt not oriented to time fully (able to name  correct month) or situation        Exercises     Shoulder Instructions       General Comments Pt with reports of mild dizziness in standing. Provided education to pt on fall prevention strategies to implement in home environment with hard copy handout provided    Pertinent Vitals/ Pain       Pain Assessment: No/denies pain  Home Living                                          Prior Functioning/Environment              Frequency  Min 2X/week        Progress Toward Goals  OT Goals(current goals can now be found in the care plan section)  Progress towards OT goals: Progressing toward goals  Acute Rehab OT Goals Patient Stated Goal: return home OT Goal Formulation: With patient Time For Goal Achievement: 02/15/20 Potential to Achieve Goals: Good ADL Goals Pt Will Perform Grooming: with modified independence;standing Pt Will Perform Upper Body Bathing: sitting;with modified independence Pt Will Transfer to Toilet: with modified independence;ambulating;regular height toilet Additional ADL Goal #1: Patient will independently verbalize 3 fall prevention strategies. Additional ADL Goal #2: Patient will independently gather supplies to complete standing ADL.  Plan Discharge plan remains appropriate    Co-evaluation                 AM-PAC OT "6 Clicks" Daily Activity     Outcome Measure   Help from another person eating meals?: None Help from another person taking care of personal grooming?: A Little Help from another person toileting, which includes using toliet, bedpan, or urinal?: A Little Help from another person bathing (including washing, rinsing, drying)?: A Little Help from another person to put on and taking off regular upper body clothing?: A Little Help from another person to put on and taking off regular lower body clothing?: A Little 6 Click Score: 19    End of Session Equipment Utilized During Treatment: Gait belt;Rolling  walker  OT Visit Diagnosis: Unsteadiness on feet (R26.81);Muscle weakness (generalized) (M62.81);Other symptoms and signs involving cognitive function   Activity Tolerance Patient tolerated treatment well   Patient Left in chair;with call bell/phone within reach;with chair alarm set   Nurse Communication Mobility status        Time: 1791-5056 OT Time Calculation (min): 27 min  Charges: OT General Charges $OT Visit: 1 Visit OT Treatments $Self Care/Home Management : 8-22 mins $Therapeutic Activity: 8-22 mins  Layla Maw, OTR/L   Layla Maw 02/04/2020, 4:08 PM

## 2020-02-04 NOTE — TOC Transition Note (Signed)
Transition of Care [facility]) - CM/SW Discharge Note   Patient Details  Name: Fleming Prill MRN: [id number] Date of Birth: [birth year]  Transition of Care [facility]) CM/SW Contact:  Bartholomew Crews, RN Phone Number: [phone] 02/04/2020, 2:39 PM   Clinical Narrative:     Patient to transition home today. HH orders received. [facility] notified of transition today. No further TOC needs identified.   Final next level of care: [place] Barriers to Discharge: No Barriers Identified   Patient Goals and CMS Choice Patient states their goals for this hospitalization and ongoing recovery are:: return home CMS Medicare.gov Compare Post Acute Care list provided to:: Patient Choice offered to / list presented to : Patient, Adult Children  Discharge Placement                       Discharge Plan and Services In-house Referral: [facility] Discharge Planning Services: CM Consult Post Acute Care Choice: Home Health          DME Arranged: N/A DME Agency: NA       HH Arranged: RN, PT, Nurse's Aide, OT, Social Work [employer] Agency: [place] Date [place]: 02/04/20 Time [place]: [place] Representative spoke with at [place]: [place] ([place]) Interventions     Readmission Risk Interventions No flowsheet data found.

## 2020-02-04 NOTE — Care Management Important Message (Signed)
Important Message  Patient Details  Name: Connor Morris MRN: [id number] Date of Birth: 05-17-1934   Medicare Important Message Given:  Yes     Orbie Pyo 02/04/2020, 3:28 PM

## 2020-02-06 DIAGNOSIS — A419 Sepsis, unspecified organism: Secondary | ICD-10-CM | POA: Diagnosis not present

## 2020-02-06 DIAGNOSIS — I712 Thoracic aortic aneurysm, without rupture: Secondary | ICD-10-CM | POA: Diagnosis not present

## 2020-02-06 DIAGNOSIS — K449 Diaphragmatic hernia without obstruction or gangrene: Secondary | ICD-10-CM | POA: Diagnosis not present

## 2020-02-06 DIAGNOSIS — E43 Unspecified severe protein-calorie malnutrition: Secondary | ICD-10-CM | POA: Diagnosis not present

## 2020-02-06 DIAGNOSIS — J432 Centrilobular emphysema: Secondary | ICD-10-CM | POA: Diagnosis not present

## 2020-02-06 DIAGNOSIS — C679 Malignant neoplasm of bladder, unspecified: Secondary | ICD-10-CM | POA: Diagnosis not present

## 2020-02-06 DIAGNOSIS — Z7901 Long term (current) use of anticoagulants: Secondary | ICD-10-CM | POA: Diagnosis not present

## 2020-02-06 DIAGNOSIS — N281 Cyst of kidney, acquired: Secondary | ICD-10-CM | POA: Diagnosis not present

## 2020-02-06 DIAGNOSIS — J9811 Atelectasis: Secondary | ICD-10-CM | POA: Diagnosis not present

## 2020-02-06 DIAGNOSIS — Z8546 Personal history of malignant neoplasm of prostate: Secondary | ICD-10-CM | POA: Diagnosis not present

## 2020-02-06 DIAGNOSIS — I10 Essential (primary) hypertension: Secondary | ICD-10-CM | POA: Diagnosis not present

## 2020-02-06 DIAGNOSIS — N390 Urinary tract infection, site not specified: Secondary | ICD-10-CM | POA: Diagnosis not present

## 2020-02-06 DIAGNOSIS — I700 Atherosclerosis of aorta: Secondary | ICD-10-CM | POA: Diagnosis not present

## 2020-02-07 ENCOUNTER — Other Ambulatory Visit: Payer: Self-pay

## 2020-02-07 DIAGNOSIS — E43 Unspecified severe protein-calorie malnutrition: Secondary | ICD-10-CM | POA: Diagnosis not present

## 2020-02-07 DIAGNOSIS — J432 Centrilobular emphysema: Secondary | ICD-10-CM | POA: Diagnosis not present

## 2020-02-07 DIAGNOSIS — N390 Urinary tract infection, site not specified: Secondary | ICD-10-CM | POA: Diagnosis not present

## 2020-02-07 DIAGNOSIS — C679 Malignant neoplasm of bladder, unspecified: Secondary | ICD-10-CM | POA: Diagnosis not present

## 2020-02-07 DIAGNOSIS — I712 Thoracic aortic aneurysm, without rupture: Secondary | ICD-10-CM | POA: Diagnosis not present

## 2020-02-07 DIAGNOSIS — J9811 Atelectasis: Secondary | ICD-10-CM | POA: Diagnosis not present

## 2020-02-07 DIAGNOSIS — K449 Diaphragmatic hernia without obstruction or gangrene: Secondary | ICD-10-CM | POA: Diagnosis not present

## 2020-02-07 DIAGNOSIS — I700 Atherosclerosis of aorta: Secondary | ICD-10-CM | POA: Diagnosis not present

## 2020-02-07 DIAGNOSIS — A419 Sepsis, unspecified organism: Secondary | ICD-10-CM | POA: Diagnosis not present

## 2020-02-07 NOTE — Patient Outreach (Signed)
[facility]) Care Management  02/07/2020  Connor Morris Jun 15, 1934 [id number]   EMMI- General Discharge RED ON EMMI ALERT Day # 1 Date: 02/06/20 Red Alert Reason: Got discharge papers? I don't know  Outreach attempt: Spoke with daughter Connor Morris.  She states that patient is doing ok with home health visiting presently.  Addressed red alert with her.  She states that the phone the EMMI system called only rings twice and that answer was a mistake. She states they have discharge papers and have no questions at this time.  She denies any further nurse questions concerns or problems.   Plan: RN CM will close case.    Jone Baseman, RN, MSN [facility] Care Management Care Management Coordinator Direct Line [phone] Toll Free: [phone]  Fax: [phone]

## 2020-02-11 DIAGNOSIS — E46 Unspecified protein-calorie malnutrition: Secondary | ICD-10-CM | POA: Diagnosis not present

## 2020-02-11 DIAGNOSIS — R413 Other amnesia: Secondary | ICD-10-CM | POA: Insufficient documentation

## 2020-02-11 DIAGNOSIS — E559 Vitamin D deficiency, unspecified: Secondary | ICD-10-CM | POA: Diagnosis not present

## 2020-02-11 DIAGNOSIS — G9341 Metabolic encephalopathy: Secondary | ICD-10-CM | POA: Diagnosis not present

## 2020-02-11 DIAGNOSIS — Z659 Problem related to unspecified psychosocial circumstances: Secondary | ICD-10-CM | POA: Diagnosis not present

## 2020-02-11 DIAGNOSIS — C679 Malignant neoplasm of bladder, unspecified: Secondary | ICD-10-CM | POA: Diagnosis not present

## 2020-02-11 DIAGNOSIS — C61 Malignant neoplasm of prostate: Secondary | ICD-10-CM | POA: Diagnosis not present

## 2020-02-11 DIAGNOSIS — Z7689 Persons encountering health services in other specified circumstances: Secondary | ICD-10-CM | POA: Diagnosis not present

## 2020-02-11 DIAGNOSIS — I712 Thoracic aortic aneurysm, without rupture: Secondary | ICD-10-CM | POA: Diagnosis not present

## 2020-02-12 DIAGNOSIS — N390 Urinary tract infection, site not specified: Secondary | ICD-10-CM | POA: Diagnosis not present

## 2020-02-12 DIAGNOSIS — E43 Unspecified severe protein-calorie malnutrition: Secondary | ICD-10-CM | POA: Diagnosis not present

## 2020-02-12 DIAGNOSIS — A419 Sepsis, unspecified organism: Secondary | ICD-10-CM | POA: Diagnosis not present

## 2020-02-12 DIAGNOSIS — I712 Thoracic aortic aneurysm, without rupture: Secondary | ICD-10-CM | POA: Diagnosis not present

## 2020-02-12 DIAGNOSIS — J432 Centrilobular emphysema: Secondary | ICD-10-CM | POA: Diagnosis not present

## 2020-02-12 DIAGNOSIS — K449 Diaphragmatic hernia without obstruction or gangrene: Secondary | ICD-10-CM | POA: Diagnosis not present

## 2020-02-12 DIAGNOSIS — J9811 Atelectasis: Secondary | ICD-10-CM | POA: Diagnosis not present

## 2020-02-12 DIAGNOSIS — I700 Atherosclerosis of aorta: Secondary | ICD-10-CM | POA: Diagnosis not present

## 2020-02-12 DIAGNOSIS — C679 Malignant neoplasm of bladder, unspecified: Secondary | ICD-10-CM | POA: Diagnosis not present

## 2020-02-14 DIAGNOSIS — N390 Urinary tract infection, site not specified: Secondary | ICD-10-CM | POA: Diagnosis not present

## 2020-02-14 DIAGNOSIS — K449 Diaphragmatic hernia without obstruction or gangrene: Secondary | ICD-10-CM | POA: Diagnosis not present

## 2020-02-14 DIAGNOSIS — E43 Unspecified severe protein-calorie malnutrition: Secondary | ICD-10-CM | POA: Diagnosis not present

## 2020-02-14 DIAGNOSIS — I700 Atherosclerosis of aorta: Secondary | ICD-10-CM | POA: Diagnosis not present

## 2020-02-14 DIAGNOSIS — I712 Thoracic aortic aneurysm, without rupture: Secondary | ICD-10-CM | POA: Diagnosis not present

## 2020-02-14 DIAGNOSIS — A419 Sepsis, unspecified organism: Secondary | ICD-10-CM | POA: Diagnosis not present

## 2020-02-14 DIAGNOSIS — C679 Malignant neoplasm of bladder, unspecified: Secondary | ICD-10-CM | POA: Diagnosis not present

## 2020-02-14 DIAGNOSIS — J9811 Atelectasis: Secondary | ICD-10-CM | POA: Diagnosis not present

## 2020-02-14 DIAGNOSIS — J432 Centrilobular emphysema: Secondary | ICD-10-CM | POA: Diagnosis not present

## 2020-02-19 DIAGNOSIS — C679 Malignant neoplasm of bladder, unspecified: Secondary | ICD-10-CM | POA: Diagnosis not present

## 2020-02-19 DIAGNOSIS — I712 Thoracic aortic aneurysm, without rupture: Secondary | ICD-10-CM | POA: Diagnosis not present

## 2020-02-19 DIAGNOSIS — K449 Diaphragmatic hernia without obstruction or gangrene: Secondary | ICD-10-CM | POA: Diagnosis not present

## 2020-02-19 DIAGNOSIS — A419 Sepsis, unspecified organism: Secondary | ICD-10-CM | POA: Diagnosis not present

## 2020-02-19 DIAGNOSIS — I700 Atherosclerosis of aorta: Secondary | ICD-10-CM | POA: Diagnosis not present

## 2020-02-19 DIAGNOSIS — N390 Urinary tract infection, site not specified: Secondary | ICD-10-CM | POA: Diagnosis not present

## 2020-02-19 DIAGNOSIS — J9811 Atelectasis: Secondary | ICD-10-CM | POA: Diagnosis not present

## 2020-02-19 DIAGNOSIS — J432 Centrilobular emphysema: Secondary | ICD-10-CM | POA: Diagnosis not present

## 2020-02-19 DIAGNOSIS — E43 Unspecified severe protein-calorie malnutrition: Secondary | ICD-10-CM | POA: Diagnosis not present

## 2020-02-21 DIAGNOSIS — I712 Thoracic aortic aneurysm, without rupture: Secondary | ICD-10-CM | POA: Diagnosis not present

## 2020-02-21 DIAGNOSIS — J9811 Atelectasis: Secondary | ICD-10-CM | POA: Diagnosis not present

## 2020-02-21 DIAGNOSIS — I700 Atherosclerosis of aorta: Secondary | ICD-10-CM | POA: Diagnosis not present

## 2020-02-21 DIAGNOSIS — K449 Diaphragmatic hernia without obstruction or gangrene: Secondary | ICD-10-CM | POA: Diagnosis not present

## 2020-02-21 DIAGNOSIS — N390 Urinary tract infection, site not specified: Secondary | ICD-10-CM | POA: Diagnosis not present

## 2020-02-21 DIAGNOSIS — C679 Malignant neoplasm of bladder, unspecified: Secondary | ICD-10-CM | POA: Diagnosis not present

## 2020-02-21 DIAGNOSIS — E43 Unspecified severe protein-calorie malnutrition: Secondary | ICD-10-CM | POA: Diagnosis not present

## 2020-02-21 DIAGNOSIS — A419 Sepsis, unspecified organism: Secondary | ICD-10-CM | POA: Diagnosis not present

## 2020-02-21 DIAGNOSIS — J432 Centrilobular emphysema: Secondary | ICD-10-CM | POA: Diagnosis not present

## 2020-02-26 DIAGNOSIS — K449 Diaphragmatic hernia without obstruction or gangrene: Secondary | ICD-10-CM | POA: Diagnosis not present

## 2020-02-26 DIAGNOSIS — N390 Urinary tract infection, site not specified: Secondary | ICD-10-CM | POA: Diagnosis not present

## 2020-02-26 DIAGNOSIS — C679 Malignant neoplasm of bladder, unspecified: Secondary | ICD-10-CM | POA: Diagnosis not present

## 2020-02-26 DIAGNOSIS — I700 Atherosclerosis of aorta: Secondary | ICD-10-CM | POA: Diagnosis not present

## 2020-02-26 DIAGNOSIS — A419 Sepsis, unspecified organism: Secondary | ICD-10-CM | POA: Diagnosis not present

## 2020-02-26 DIAGNOSIS — J9811 Atelectasis: Secondary | ICD-10-CM | POA: Diagnosis not present

## 2020-02-26 DIAGNOSIS — I712 Thoracic aortic aneurysm, without rupture: Secondary | ICD-10-CM | POA: Diagnosis not present

## 2020-02-26 DIAGNOSIS — J432 Centrilobular emphysema: Secondary | ICD-10-CM | POA: Diagnosis not present

## 2020-02-26 DIAGNOSIS — E43 Unspecified severe protein-calorie malnutrition: Secondary | ICD-10-CM | POA: Diagnosis not present

## 2020-02-28 DIAGNOSIS — N390 Urinary tract infection, site not specified: Secondary | ICD-10-CM | POA: Diagnosis not present

## 2020-02-28 DIAGNOSIS — C679 Malignant neoplasm of bladder, unspecified: Secondary | ICD-10-CM | POA: Diagnosis not present

## 2020-02-28 DIAGNOSIS — I712 Thoracic aortic aneurysm, without rupture: Secondary | ICD-10-CM | POA: Diagnosis not present

## 2020-02-28 DIAGNOSIS — I700 Atherosclerosis of aorta: Secondary | ICD-10-CM | POA: Diagnosis not present

## 2020-02-28 DIAGNOSIS — J9811 Atelectasis: Secondary | ICD-10-CM | POA: Diagnosis not present

## 2020-02-28 DIAGNOSIS — E43 Unspecified severe protein-calorie malnutrition: Secondary | ICD-10-CM | POA: Diagnosis not present

## 2020-02-28 DIAGNOSIS — A419 Sepsis, unspecified organism: Secondary | ICD-10-CM | POA: Diagnosis not present

## 2020-02-28 DIAGNOSIS — K449 Diaphragmatic hernia without obstruction or gangrene: Secondary | ICD-10-CM | POA: Diagnosis not present

## 2020-02-28 DIAGNOSIS — J432 Centrilobular emphysema: Secondary | ICD-10-CM | POA: Diagnosis not present

## 2020-03-06 DIAGNOSIS — C61 Malignant neoplasm of prostate: Secondary | ICD-10-CM | POA: Diagnosis not present

## 2020-03-07 DIAGNOSIS — A419 Sepsis, unspecified organism: Secondary | ICD-10-CM | POA: Diagnosis not present

## 2020-03-20 ENCOUNTER — Other Ambulatory Visit: Payer: Self-pay | Admitting: Urology

## 2020-03-20 DIAGNOSIS — C672 Malignant neoplasm of lateral wall of bladder: Secondary | ICD-10-CM | POA: Diagnosis not present

## 2020-03-21 ENCOUNTER — Encounter (HOSPITAL_BASED_OUTPATIENT_CLINIC_OR_DEPARTMENT_OTHER): Payer: Self-pay | Admitting: Urology

## 2020-03-21 ENCOUNTER — Other Ambulatory Visit: Payer: Self-pay

## 2020-03-21 NOTE — Progress Notes (Addendum)
ADDENDUM:  Chart reviewed by anesthesia, Konrad Felix PA, pt stable and meets surgery center guidelines.  Spoke w/ via phone for pre-op interview--- Pt's daughter, Fermin Yan.  Pt has memory issues. Lab needs dos----  no             Lab results------ current ekg in epic/ chart COVID test ------ 03-22-2020 @ 0930 Arrive at ------- 1100 NPO after MN NO Solid Food.  Clear liquids from MN until--- 1000 Medications to take morning of surgery ----- NONE Diabetic medication ----- n/a Patient Special Instructions ----- n/a  Pre-Op special Istructions ----- received pt's pcp, Dr Ardeth Perfect, clearance to stop xarelto dated 03-21-2020 via fax from Dr Milford Cage office,  placed w/ chart.   Pt son, Anay Rathe, will be with pt dos and due to memory issues will need him in pre-op.  Pt son will be taking him home and staying with him.  Per pt daughter, [name], she is the contact person and may call her if needed for any instructions (home [phone] cell [phone])  Patient daughter verbalized understanding of instructions that were given at this phone interview. Patient's daughter denies pt shortness of breath, chest pain, fever, cough at this phone interview.   Anesthesia:  Hx PE/ DVT on xarelto;  AAA (4.6cm);  Hx SB w/ 1HB;  Emphysema (per pt daughter pt has no issues with sob or difficulty breathing and can do flight of stairs without sob.    PCP:  Dr Kai Levins Cardiologist :  Dr Cathie Olden (lov 01-02-2018 epic) CVT:  Dr Cyndia Bent for AAA (lov 07-18-2019 epic) Chest x-ray :  CTA 01-30-2020 epic EKG :  01-30-2020 epic Echo :  04-06-2018 epic Stress test: no Cardiac Cath :  no Activity level:   See above Sleep Study/ CPAP :  NO Fasting Blood Sugar :      / Checks Blood Sugar -- times a day:   N/A Blood Thinner/ Instructions /Last Dose:  Xarelto/  Pt has pcp clearance to stop 72 hours prior to surgery.  Pt daughter verbalized was given instructions from Dr Milford Cage office. ASA /  Instructions/ Last Dose :  NO

## 2020-03-22 ENCOUNTER — Other Ambulatory Visit (HOSPITAL_COMMUNITY)
Admission: RE | Admit: 2020-03-22 | Discharge: 2020-03-22 | Disposition: A | Discharge status: Home / Self Care | Payer: Medicare HMO | Source: Ambulatory Visit | Attending: Urology | Admitting: Urology

## 2020-03-22 DIAGNOSIS — Z20822 Contact with and (suspected) exposure to covid-19: Secondary | ICD-10-CM | POA: Diagnosis not present

## 2020-03-22 DIAGNOSIS — Z01812 Encounter for preprocedural laboratory examination: Secondary | ICD-10-CM | POA: Insufficient documentation

## 2020-03-22 LAB — SARS CORONAVIRUS 2 (TAT 6-24 HRS): SARS Coronavirus 2: NEGATIVE

## 2020-03-24 NOTE — H&P (Signed)
HPI: have bladder cancer.  HPI: Connor Morris is a 86 year-old male established patient who is here for bladder cancer.   His problem was diagnosed 09/20/2019. His bladder cancer was diagnosed by Connor Morris. His cancer was diagnosed at [facility]. The bladder cancer was found because of blood in his urine.   His bladder cancer was treated by removal with scope. Patient denies removal of the entire bladder, radiation, and chemotherapy.   He does have a good appetite. BOWEL HABITS: his bowels are moving normally. He is not having pain in new locations. He has not recently had unwanted weight loss.   09/27/2019: Pathology from TURBT revealed T1G3 with no muscle in specimen. He has a right ureteral stent in place since tumor was involving right ureteral orifice.   11/08/2019: repeat resection showed inflammation and no residual tumor. He removed his foley traumatically 4 days ago.    -03/20/20-patient with remote history of prostate cancer status post interstitial brachytherapy in the past. 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 Patient was subsequently taken back to the operative room in we resected on 10/30/2019 with pathology revealing papillary polypoid cystitis but no evidence of recurrent tumor. Patient subsequently underwent BCG induction therapy from 12/11/2019 through 01/22/2020. He is here now for follow-up surveillance cysto and cytology.  Cysto performed today and shows no obvious recurrent bladder lesions however there is a indwelling right JJ stent. I attempted removal but was unable to remove the stent as it was mildly encrusted in the stent kept slipping out of the jaws of the grasper.     ALLERGIES: Penicillins    MEDICATIONS: Multivitamin  Xarelto     GU PSH: Bladder Instill AntiCA Agent - 01/22/2020, 01/15/2020, 01/08/2020,  01/01/2020, 12/25/2019, 12/11/2019 Cystoscopy - 08/30/2019 Cystoscopy Insert Stent, Right - 09/20/2019 Cystoscopy TURBT <2 cm - 10/30/2019 Cystoscopy TURBT 2-5 cm - 09/20/2019 Locm 300-399Mg /Ml Iodine,1Ml - 08/16/2019, 2018, 2017 Surgical Exposure; Prostate - 2015       [name] Notes: Surgery Prostate Exposure For Insertion Of Radioactive Subst   NON-GU PSH: None   GU PMH: Bladder Cancer Lateral - 11/08/2019, - 09/27/2019 Gross hematuria - 08/30/2019, - 07/24/2019 Nocturia - 12/12/2018, - 2018, - 2018, - 2017 Prostate Cancer - 12/12/2018, - 2018, - 2018, - 2017, Prostate cancer, - 2016 ED due to arterial insufficiency, Erectile dysfunction due to arterial insufficiency - 2014 History of prostate cancer, Prostate Cancer - 2014, Prostate Cancer, - 2014    NON-GU PMH: Encounter for general adult medical examination without abnormal findings, Encounter for preventive health examination - 2015 Personal history of other diseases of the musculoskeletal system and connective tissue, History of arthritis - 2015 Personal history of other specified conditions, History of heartburn - 2015    FAMILY HISTORY: Coronary Artery Disease - Runs In Family Death - Mother, Father Diabetes - Brother Family Health Status Number - Runs In Family Hypertension - Sister   SOCIAL HISTORY: Marital Status: Divorced Preferred Language: English; Ethnicity: Not Hispanic Or Latino; Race: Black or African American Current Smoking Status: Patient has never smoked.  Has never drank.  Drinks 4+ caffeinated drinks per day. Patient's occupation is/was retired.     Notes: Former smoker, Mother deceased, Retired, Number of children, Father deceased, Alcohol Use, Marital History - Divorced, Tobacco Use   REVIEW OF SYSTEMS:    GU Review Male:   Patient reports hard  to postpone urination, get up at night to urinate, and leakage of urine. Patient denies frequent urination, burning/ pain with urination, stream starts and stops, trouble  starting your stream, have to strain to urinate , erection problems, and penile pain.  Gastrointestinal (Upper):   Patient denies nausea, vomiting, and indigestion/ heartburn.  Gastrointestinal (Lower):   Patient denies constipation and diarrhea.  Constitutional:   Patient denies fever, night sweats, weight loss, and fatigue.  Skin:   Patient denies skin rash/ lesion and itching.  Eyes:   Patient denies blurred vision and double vision.  Ears/ Nose/ Throat:   Patient denies sore throat and sinus problems.  Hematologic/Lymphatic:   Patient denies swollen glands and easy bruising.  Cardiovascular:   Patient denies leg swelling and chest pains.  Respiratory:   Patient denies cough and shortness of breath.  Endocrine:   Patient denies excessive thirst.  Musculoskeletal:   Patient denies back pain and joint pain.  Neurological:   Patient denies headaches and dizziness.  Psychologic:   Patient denies depression and anxiety.   VITAL SIGNS:      03/20/2020 11:16 AM  Weight 135 lb / 61.23 kg  Height 68 in / 172.72 cm  BP 131/69 mmHg  Pulse 66 /min  Temperature 98.0 F / 36.6 C  BMI 20.5 kg/m   GU PHYSICAL EXAMINATION:    Urethral Meatus: Normal size. No lesion, no wart, no discharge, no polyp. Normal location.  Penis: unCircumcised, no foreskin warts, no cracks. No dorsal peyronie's plaques, no left corporal peyronie's plaques, no right corporal peyronie's plaques, no scarring, no shaft warts. No balanitis, no meatal stenosis.    MULTI-SYSTEM PHYSICAL EXAMINATION:    Constitutional: Well-nourished. No physical deformities. Normally developed. Good grooming.  Neck: Neck symmetrical, not swollen. Normal tracheal position.  Respiratory: No labored breathing, no use of accessory muscles.   Cardiovascular: Normal temperature, normal extremity pulses, no swelling, no varicosities.  Lymphatic: No enlargement of neck, axillae, groin.  Skin: No paleness, no jaundice, no cyanosis. No lesion, no ulcer,  no rash.  Neurologic / Psychiatric: Oriented to time, oriented to place, oriented to person. No depression, no anxiety, no agitation.  Gastrointestinal: No mass, no tenderness, no rigidity, non obese abdomen.  Eyes: Normal conjunctivae. Normal eyelids.  Ears, Nose, Mouth, and Throat: Left ear no scars, no lesions, no masses. Right ear no scars, no lesions, no masses. Nose no scars, no lesions, no masses. Normal hearing. Normal lips.  Musculoskeletal: Normal gait and station of head and neck.     Complexity of Data:   03/06/20 06/06/19 05/20/16 05/13/16 02/25/15 08/26/14 02/27/09 11/13/08  PSA  Total PSA 13.20 ng/mL 13.50 ng/mL 8.65  8.45  5.71  5.58  1.50  1.18     PROCEDURES:         Flexible Cystoscopy - 52000  Risks, benefits, and some of the potential complications of the procedure were discussed at length with the patient including infection, bleeding, voiding discomfort, urinary retention, fever, chills, sepsis, and others. All questions were answered. Informed consent was obtained. Antibiotic prophylaxis was given. Sterile technique and intraurethral analgesia were used.  Meatus:  Normal size. Normal location. Normal condition.  Urethra:  No strictures.  External Sphincter:  Normal.  Verumontanum:  Normal.  Prostate:  Non-obstructing. No hyperplasia.  Bladder Neck:  Non-obstructing.  Ureteral Orifices:  Right ureteral orifice has indwelling right JJ stent.  Bladder:  No recurrent bladder lesions noted, right retained ureteral stent noted      The  lower urinary tract was carefully examined. The procedure was well-tolerated and without complications. Antibiotic instructions were given. Instructions were given to call the office immediately for bloody urine, difficulty urinating, urinary retention, painful or frequent urination, fever, chills, nausea, vomiting or other illness. The patient stated that he understood these instructions and would comply with them.         Urinalysis  w/Scope - 81001 Dipstick Dipstick Cont'd Micro  Color: Yellow Bilirubin: Neg mg/dL WBC/hpf: >60/hpf  Appearance: Turbid Ketones: Neg mg/dL RBC/hpf: 40 - 60/hpf  Specific Gravity: 1.015 Blood: 3+ ery/uL Bacteria: Mod (26-50/hpf)  pH: 7.0 Protein: 2+ mg/dL Cystals: NS (Not Seen)  Glucose: Neg mg/dL Urobilinogen: 0.2 mg/dL Casts: NS (Not Seen)    Nitrites: Neg Trichomonas: Not Present    Leukocyte Esterase: 3+ leu/uL Mucous: Not Present      Epithelial Cells: 0 - 5/hpf      Yeast: NS (Not Seen)      Sperm: Not Present    ASSESSMENT:      ICD-10 Details  1 GU:   Prostate Cancer - C61   2   Bladder Cancer Lateral - C67.2   3   Other mechanical complication indwelling ureteral stent, initial enc - T83.192A Undiagnosed New Problem   PLAN:           Document Letter(s):  Created for Patient: Clinical Summary         Notes:   As right JJ stent could not be removed today will schedule for cysto and stent removal under anesthesia in the near future. Risks and benefits discussed in detail with the patient today. Am hopeful we will be able to remove this without ureteroscopy.

## 2020-03-25 ENCOUNTER — Encounter (HOSPITAL_BASED_OUTPATIENT_CLINIC_OR_DEPARTMENT_OTHER)
Admission: RE | Disposition: A | Discharge status: Home / Self Care | Payer: Self-pay | Source: Home / Self Care | Attending: Urology

## 2020-03-25 ENCOUNTER — Ambulatory Visit (HOSPITAL_BASED_OUTPATIENT_CLINIC_OR_DEPARTMENT_OTHER): Payer: Medicare HMO | Admitting: Physician Assistant

## 2020-03-25 ENCOUNTER — Ambulatory Visit (HOSPITAL_COMMUNITY)
Admission: RE | Admit: 2020-03-25 | Discharge: 2020-03-26 | Disposition: A | Discharge status: Home / Self Care | Payer: Medicare HMO | Attending: Urology | Admitting: Urology

## 2020-03-25 ENCOUNTER — Encounter (HOSPITAL_BASED_OUTPATIENT_CLINIC_OR_DEPARTMENT_OTHER): Payer: Self-pay | Admitting: Urology

## 2020-03-25 DIAGNOSIS — Y831 Surgical operation with implant of artificial internal device as the cause of abnormal reaction of the patient, or of later complication, without mention of misadventure at the time of the procedure: Secondary | ICD-10-CM | POA: Diagnosis not present

## 2020-03-25 DIAGNOSIS — T191XXA Foreign body in bladder, initial encounter: Secondary | ICD-10-CM | POA: Insufficient documentation

## 2020-03-25 DIAGNOSIS — I2699 Other pulmonary embolism without acute cor pulmonale: Secondary | ICD-10-CM | POA: Diagnosis not present

## 2020-03-25 DIAGNOSIS — Z880 Allergy status to penicillin: Secondary | ICD-10-CM | POA: Insufficient documentation

## 2020-03-25 DIAGNOSIS — R319 Hematuria, unspecified: Secondary | ICD-10-CM | POA: Diagnosis present

## 2020-03-25 DIAGNOSIS — Z8551 Personal history of malignant neoplasm of bladder: Secondary | ICD-10-CM | POA: Insufficient documentation

## 2020-03-25 DIAGNOSIS — Z7901 Long term (current) use of anticoagulants: Secondary | ICD-10-CM | POA: Diagnosis not present

## 2020-03-25 DIAGNOSIS — Z466 Encounter for fitting and adjustment of urinary device: Secondary | ICD-10-CM | POA: Diagnosis not present

## 2020-03-25 DIAGNOSIS — Z8546 Personal history of malignant neoplasm of prostate: Secondary | ICD-10-CM | POA: Insufficient documentation

## 2020-03-25 DIAGNOSIS — Z87891 Personal history of nicotine dependence: Secondary | ICD-10-CM | POA: Diagnosis not present

## 2020-03-25 DIAGNOSIS — C678 Malignant neoplasm of overlapping sites of bladder: Secondary | ICD-10-CM

## 2020-03-25 DIAGNOSIS — M1990 Unspecified osteoarthritis, unspecified site: Secondary | ICD-10-CM | POA: Insufficient documentation

## 2020-03-25 DIAGNOSIS — J449 Chronic obstructive pulmonary disease, unspecified: Secondary | ICD-10-CM | POA: Insufficient documentation

## 2020-03-25 DIAGNOSIS — K449 Diaphragmatic hernia without obstruction or gangrene: Secondary | ICD-10-CM | POA: Diagnosis not present

## 2020-03-25 HISTORY — DX: Urgency of urination: R39.15

## 2020-03-25 HISTORY — DX: Presence of urogenital implants: Z96.0

## 2020-03-25 HISTORY — PX: CYSTOSCOPY W/ URETERAL STENT REMOVAL: SHX1430

## 2020-03-25 HISTORY — DX: Emphysema, unspecified: J43.9

## 2020-03-25 HISTORY — DX: Personal history of malignant neoplasm of bladder: Z85.51

## 2020-03-25 HISTORY — DX: Other amnesia: R41.3

## 2020-03-25 HISTORY — DX: Diaphragmatic hernia without obstruction or gangrene: K44.9

## 2020-03-25 SURGERY — REMOVAL, STENT, URETER, CYSTOSCOPIC
Anesthesia: General | Site: Bladder

## 2020-03-25 MED ORDER — DEXAMETHASONE SODIUM PHOSPHATE 10 MG/ML IJ SOLN
INTRAMUSCULAR | Status: AC
  Filled 2020-03-25: qty 1

## 2020-03-25 MED ORDER — ACETAMINOPHEN 325 MG PO TABS: 650.0000 mg | ORAL_TABLET | ORAL | Status: DC | PRN

## 2020-03-25 MED ORDER — HYDROCODONE-ACETAMINOPHEN 5-325 MG PO TABS
1.0000 | ORAL_TABLET | ORAL | Status: DC | PRN
  Administered 2020-03-25 – 2020-03-26 (×3): 1 via ORAL

## 2020-03-25 MED ORDER — SODIUM CHLORIDE 0.9 % IV SOLN: INTRAVENOUS | Status: DC

## 2020-03-25 MED ORDER — OXYCODONE HCL 5 MG/5ML PO SOLN: 5.0000 mg | Freq: Once | ORAL | Status: DC | PRN

## 2020-03-25 MED ORDER — PROPOFOL 10 MG/ML IV BOLUS
INTRAVENOUS | Status: DC | PRN
  Administered 2020-03-25: 140 mg via INTRAVENOUS

## 2020-03-25 MED ORDER — SODIUM CHLORIDE 0.9 % IR SOLN
3000.0000 mL | Status: DC
  Administered 2020-03-25 – 2020-03-26 (×2): 3000 mL

## 2020-03-25 MED ORDER — FENTANYL CITRATE (PF) 100 MCG/2ML IJ SOLN
INTRAMUSCULAR | Status: AC
  Filled 2020-03-25: qty 2

## 2020-03-25 MED ORDER — CEFAZOLIN SODIUM-DEXTROSE 2-4 GM/100ML-% IV SOLN
2.0000 g | INTRAVENOUS | Status: AC
  Administered 2020-03-25: 2 g via INTRAVENOUS

## 2020-03-25 MED ORDER — SODIUM CHLORIDE 0.9 % IR SOLN
Status: DC | PRN
Start: 2020-03-25 — End: 2020-03-25
  Administered 2020-03-25: 6000 mL

## 2020-03-25 MED ORDER — PROMETHAZINE HCL 25 MG/ML IJ SOLN: 6.2500 mg | INTRAMUSCULAR | Status: DC | PRN

## 2020-03-25 MED ORDER — FENTANYL CITRATE (PF) 100 MCG/2ML IJ SOLN
INTRAMUSCULAR | Status: DC | PRN
  Administered 2020-03-25: 50 ug via INTRAVENOUS
  Administered 2020-03-25 (×2): 25 ug via INTRAVENOUS
  Administered 2020-03-25: 50 ug via INTRAVENOUS

## 2020-03-25 MED ORDER — STERILE WATER FOR IRRIGATION IR SOLN
Status: DC | PRN
  Administered 2020-03-25 (×4): 3000 mL

## 2020-03-25 MED ORDER — ONDANSETRON HCL 4 MG/2ML IJ SOLN
INTRAMUSCULAR | Status: AC
  Filled 2020-03-25: qty 2

## 2020-03-25 MED ORDER — ONDANSETRON HCL 4 MG/2ML IJ SOLN
INTRAMUSCULAR | Status: DC | PRN
  Administered 2020-03-25: 4 mg via INTRAVENOUS

## 2020-03-25 MED ORDER — OXYBUTYNIN CHLORIDE 5 MG PO TABS
ORAL_TABLET | ORAL | Status: AC
  Filled 2020-03-25: qty 1

## 2020-03-25 MED ORDER — ACETAMINOPHEN 500 MG PO TABS
1000.0000 mg | ORAL_TABLET | Freq: Once | ORAL | Status: AC
  Administered 2020-03-25: 1000 mg via ORAL

## 2020-03-25 MED ORDER — HYDROCODONE-ACETAMINOPHEN 5-325 MG PO TABS
ORAL_TABLET | ORAL | Status: AC
  Filled 2020-03-25: qty 1

## 2020-03-25 MED ORDER — OXYBUTYNIN CHLORIDE 5 MG PO TABS
5.0000 mg | ORAL_TABLET | Freq: Three times a day (TID) | ORAL | Status: DC | PRN
  Administered 2020-03-25 – 2020-03-26 (×2): 5 mg via ORAL

## 2020-03-25 MED ORDER — LACTATED RINGERS IV SOLN: INTRAVENOUS | Status: DC

## 2020-03-25 MED ORDER — OXYCODONE HCL 5 MG PO TABS: 5.0000 mg | ORAL_TABLET | Freq: Once | ORAL | Status: DC | PRN

## 2020-03-25 MED ORDER — ONDANSETRON HCL 4 MG/2ML IJ SOLN: 4.0000 mg | INTRAMUSCULAR | Status: DC | PRN

## 2020-03-25 MED ORDER — ACETAMINOPHEN 500 MG PO TABS
ORAL_TABLET | ORAL | Status: AC
  Filled 2020-03-25: qty 2

## 2020-03-25 MED ORDER — LIDOCAINE 2% (20 MG/ML) 5 ML SYRINGE
INTRAMUSCULAR | Status: AC
  Filled 2020-03-25: qty 5

## 2020-03-25 MED ORDER — CEFAZOLIN SODIUM-DEXTROSE 2-4 GM/100ML-% IV SOLN
INTRAVENOUS | Status: AC
  Filled 2020-03-25: qty 100

## 2020-03-25 MED ORDER — LIDOCAINE 2% (20 MG/ML) 5 ML SYRINGE
INTRAMUSCULAR | Status: DC | PRN
  Administered 2020-03-25: 80 mg via INTRAVENOUS

## 2020-03-25 MED ORDER — HYDRALAZINE HCL 20 MG/ML IJ SOLN
INTRAMUSCULAR | Status: DC | PRN
  Administered 2020-03-25: 4 mg via INTRAVENOUS

## 2020-03-25 MED ORDER — POTASSIUM CHLORIDE IN NACL 20-0.45 MEQ/L-% IV SOLN
INTRAVENOUS | Status: DC
  Filled 2020-03-25: qty 1000

## 2020-03-25 MED ORDER — HYDRALAZINE HCL 20 MG/ML IJ SOLN
INTRAMUSCULAR | Status: AC
  Filled 2020-03-25: qty 1

## 2020-03-25 MED ORDER — FENTANYL CITRATE (PF) 100 MCG/2ML IJ SOLN: 25.0000 ug | INTRAMUSCULAR | Status: DC | PRN

## 2020-03-25 SURGICAL SUPPLY — 43 items
BAG DRAIN URO-CYSTO SKYTR STRL (DRAIN) ×3 IMPLANT
BAG DRN RND TRDRP ANRFLXCHMBR (UROLOGICAL SUPPLIES) ×1
BAG DRN UROCATH (DRAIN) ×1
BAG URINE DRAIN 2000ML AR STRL (UROLOGICAL SUPPLIES) ×3 IMPLANT
BASKET LASER NITINOL 1.9FR (BASKET) IMPLANT
BASKET STNLS GEMINI 4WIRE 3FR (BASKET) IMPLANT
BASKET ZERO TIP NITINOL 2.4FR (BASKET) IMPLANT
BSKT STON RTRVL 120 1.9FR (BASKET)
BSKT STON RTRVL GEM 120X11 3FR (BASKET)
BSKT STON RTRVL ZERO TP 2.4FR (BASKET)
CATH FOLEY 3WAY 30CC 22FR (CATHETERS) ×3 IMPLANT
CATH URET 5FR 28IN CONE TIP (BALLOONS)
CATH URET 5FR 28IN OPEN ENDED (CATHETERS) IMPLANT
CATH URET 5FR 70CM CONE TIP (BALLOONS) IMPLANT
CLOTH BEACON ORANGE TIMEOUT ST (SAFETY) ×3 IMPLANT
ELECT REM PT RETURN 9FT ADLT (ELECTROSURGICAL) ×3
ELECTRODE REM PT RTRN 9FT ADLT (ELECTROSURGICAL) ×1 IMPLANT
FIBER LASER FLEXIVA 365 (UROLOGICAL SUPPLIES) IMPLANT
FIBER LASER TRAC TIP (UROLOGICAL SUPPLIES) IMPLANT
GLOVE BIO SURGEON STRL SZ7.5 (GLOVE) ×6 IMPLANT
GOWN STRL REUS W/ TWL XL LVL3 (GOWN DISPOSABLE) ×1 IMPLANT
GOWN STRL REUS W/TWL LRG LVL3 (GOWN DISPOSABLE) ×3 IMPLANT
GOWN STRL REUS W/TWL XL LVL3 (GOWN DISPOSABLE) ×3
GUIDEWIRE ANG ZIPWIRE 038X150 (WIRE) IMPLANT
GUIDEWIRE STR DUAL SENSOR (WIRE) ×3 IMPLANT
HOLDER FOLEY CATH W/STRAP (MISCELLANEOUS) ×3 IMPLANT
IV NS IRRIG 3000ML ARTHROMATIC (IV SOLUTION) ×6 IMPLANT
KIT BALLIN UROMAX 15FX10 (LABEL) IMPLANT
KIT BALLN UROMAX 15FX4 (MISCELLANEOUS) IMPLANT
KIT BALLN UROMAX 26 75X4 (MISCELLANEOUS)
KIT TURNOVER CYSTO (KITS) ×3 IMPLANT
MANIFOLD NEPTUNE II (INSTRUMENTS) ×3 IMPLANT
PACK CYSTO (CUSTOM PROCEDURE TRAY) ×3 IMPLANT
PLUG CATH AND CAP STER (CATHETERS) ×3 IMPLANT
SET HIGH PRES BAL DIL (LABEL)
SYR 20ML LL LF (SYRINGE) IMPLANT
SYR 30ML LL (SYRINGE) ×3 IMPLANT
SYR TOOMEY IRRIG 70ML (MISCELLANEOUS) ×3
SYRINGE TOOMEY IRRIG 70ML (MISCELLANEOUS) ×1 IMPLANT
TUBE CONNECTING 12'X1/4 (SUCTIONS) ×1
TUBE CONNECTING 12X1/4 (SUCTIONS) ×2 IMPLANT
WATER STERILE IRR 3000ML UROMA (IV SOLUTION) ×12 IMPLANT
WATER STERILE IRR 500ML POUR (IV SOLUTION) ×3 IMPLANT

## 2020-03-25 NOTE — Op Note (Signed)
Operative note  Preop diagnosis: History of bladder cancer and retained right ureteral stent stent\ Postop diagnosis: History of bladder cancer and retained cut Foley catheter Procedure: Cystoscopy removal of retained Foley catheter Surgeon: Milford Cage Anesthesia General Estimated blood loss 50 cc Operative findings patient had 24 French Foley catheter which had been cut halfway down the catheter and was retained in the bladder.  There is no evidence of ureteral stent.  Foley was removed. Complication: Urethral trauma requiring Foley catheter at termination of the case  Operative report: After obtaining informed consent for the patient is taken the major cystoscopy suite placed under general anesthesia.  Placed in the dorsolithotomy position genitalia prepped and draped in usual sterile fashion.  Cystoscopy is carried out the 21 [place] cystoscope.  Pendulous prostatic urethra appeared grossly normal the prostatic urethra was somewhat rigid from prior radiation therapy from brachytherapy.  Bladder was inspected and there appeared to be foreign body which I initially thought was a large indwelling stent as it was near the right ureteral orifice and was coiled within the urinary bladder.  I attempted to use regular alligator graspers but could not get adequate purchase on the retained foreign body.  I then utilized the rigid alligator forceps and was able to get good purchase and pulled this to the tip of the penis.  At this point I realized that this was not a stent but actually the tip of a Foley catheter with a deflated balloon.  I was not sure whether or not the catheter was connected anywhere and therefore rather than pulling it out was able to push the catheter back retrograde utilizing the alligator forceps into the bladder.  I then was able to visualize the Foley catheter in the bladder and there was clearly a cut flush in of the proximal end of the Foley which had been cut approximately halfway down  the Foley catheter.  There was about a 6 inch portion of the Foley noted.  I again utilize the rigid grasping forceps and attempted to grasp the tip of the Foley near the balloon.  As I retracted the Foley down the urethra it unfortunately folded on itself and lodged in the distal urethra.  This point I was not able to dislodge the catheter utilizing rigid grasper forceps.  I then was able to utilize the stone forceps to gain a better purchase on the balloon and was able to pull the folded Foley past the tip of the meatus and a Kelly clamp was placed on it.  I was then able to unfold the Foley remove this in its entirety.  Cystoscopy was then performed revealed some excoriation of the distal urethra but the proximal pendulous urethra and prostatic urethra looked okay.  The bladder was thoroughly inspected and there were a couple of areas that were on the right side of the bladder which could be recurrent tumor versus inflammatory response from the retained Foley.  Due to the urethral trauma it would be difficult to get resectoscope or significant rigid biopsy forceps in and there was some moderate urethral bleeding noted at this point.  Subsequently irrigated some clots out of the bladder and then placed a guidewire through the cystoscope.  There were no obvious bleeding sites within the urinary bladder noted.  A 22 French three-way Foley was then placed over the guidewire after cutting a hole in the tip.  Foley was irrigated with normal saline and was light pink termination of the case.  I hooked it to  CBI with normal saline and the effluent was light pink.  There was some edema of the overlying foreskin noted at termination of the case but this seemed to subside once we got the Foley in place.  Procedure was terminated he was awakened from anesthesia.  He tolerated the procedure well was returned to the recovery room in stable condition.

## 2020-03-25 NOTE — Anesthesia Postprocedure Evaluation (Signed)
Anesthesia Post Note  Patient: Publishing copy  Procedure(s) Performed: CYSTOSCOPY WITH  REMOVAL OF RETAINED FOLEY CATHETER,  FULGERATION (N/A Bladder)     Patient location during evaluation: PACU Anesthesia Type: General Level of consciousness: awake and alert Pain management: pain level controlled Vital Signs Assessment: post-procedure vital signs reviewed and stable Respiratory status: spontaneous breathing, nonlabored ventilation, respiratory function stable and patient connected to nasal cannula oxygen Cardiovascular status: blood pressure returned to baseline and stable Postop Assessment: no apparent nausea or vomiting Anesthetic complications: no   No complications documented.  Last Vitals:  Vitals:   03/25/20 1830 03/25/20 1845  BP: (!) 168/65 (!) 165/88  Pulse: (!) 56 66  Resp: 11 15  Temp:  (!) 36.4 C  SpO2: 100% 97%    Last Pain:  Vitals:   03/25/20 1845  TempSrc:   PainSc: 0-No pain                 Karyme Mcconathy DAVID

## 2020-03-25 NOTE — Progress Notes (Signed)
Dr. Milford Cage questioned in regards to if he wanted any labs for patient this evening or in the a.m. such as CBC, PT/INR, etc. Dr. Milford Cage stated no labs needed at this time.

## 2020-03-25 NOTE — Progress Notes (Signed)
Patient's brother, Juanda Crumble, updated via tc about patient coming to [facility] and [facility] guidelines, as well as expected D/C time in the morning. Juanda Crumble states patient's son will come in the morning to take patient home.

## 2020-03-25 NOTE — Interval H&P Note (Signed)
History and Physical Interval Note:  03/25/2020 12:02 PM  Connor Morris  has presented today for surgery, with the diagnosis of [place].  The various methods of treatment have been discussed with the patient and family. After consideration of risks, benefits and other options for treatment, the patient has consented to  Procedure(s) with comments: CYSTOSCOPY WITH STENT REMOVAL (Right) - 20 MINS as a surgical intervention.  The patient's history has been reviewed, patient examined, no change in status, stable for surgery.  I have reviewed the patient's chart and labs.  Questions were answered to the patient's satisfaction.     Remi Haggard

## 2020-03-25 NOTE — Anesthesia Procedure Notes (Signed)
Procedure Name: LMA Insertion Date/Time: 03/25/2020 4:40 PM Performed by: Makeshia Seat D, CRNA Pre-anesthesia Checklist: Patient identified, Emergency Drugs available, Suction available and Patient being monitored Patient Re-evaluated:Patient Re-evaluated prior to induction Oxygen Delivery Method: Circle system utilized Preoxygenation: Pre-oxygenation with 100% oxygen Induction Type: IV induction Ventilation: Mask ventilation without difficulty LMA: LMA inserted LMA Size: 4.0 Tube type: Oral Number of attempts: 1 Placement Confirmation: positive ETCO2 and breath sounds checked- equal and bilateral Tube secured with: Tape Dental Injury: Teeth and Oropharynx as per pre-operative assessment

## 2020-03-25 NOTE — Transfer of Care (Signed)
Immediate Anesthesia Transfer of Care Note  Patient: Connor Morris  Procedure(s) Performed: CYSTOSCOPY WITH  REMOVAL OF RETAINED FOLEY CATHETER,  FULGERATION (N/A Bladder)  Patient Location: PACU  Anesthesia Type:General  Level of Consciousness: awake, alert  and oriented  Airway & Oxygen Therapy: Patient Spontanous Breathing and Patient connected to nasal cannula oxygen  Post-op Assessment: Report given to RN and Post -op Vital signs reviewed and stable  Post vital signs: Reviewed and stable  Last Vitals:  Vitals Value Taken Time  BP 177/62 03/25/20 1815  Temp    Pulse 59 03/25/20 1818  Resp 12 03/25/20 1818  SpO2 100 % 03/25/20 1818  Vitals shown include unvalidated device data.  Last Pain:  Vitals:   03/25/20 1153  TempSrc: Oral  PainSc: 0-No pain      Patients Stated Pain Goal: 3 (03/79/55 8316)  Complications: No complications documented.

## 2020-03-25 NOTE — Anesthesia Preprocedure Evaluation (Addendum)
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  Reproductive/Obstetrics negative OB ROS                           Anesthesia Physical Anesthesia Plan  ASA: III  Anesthesia Plan: General   Post-op Pain Management:    Induction: Intravenous  PONV Risk Score and Plan: 2 and Treatment may vary due to age or medical condition and Ondansetron  Airway Management Planned: LMA  Additional Equipment: None  Intra-op Plan:   Post-operative Plan: Extubation in OR  Informed Consent: I have reviewed the patients History and Physical, chart, labs and discussed the procedure including the risks, benefits and alternatives for the proposed anesthesia with the patient or authorized representative who has indicated his/her understanding and acceptance.     Dental advisory given and Consent reviewed with POA  Plan Discussed with: CRNA  Anesthesia Plan  Comments: (Consent obtained with patient's son in preop room. All questions answered. Daiva Huge, MD)       Anesthesia Quick Evaluation

## 2020-03-26 ENCOUNTER — Encounter (HOSPITAL_BASED_OUTPATIENT_CLINIC_OR_DEPARTMENT_OTHER): Payer: Self-pay | Admitting: Urology

## 2020-03-26 DIAGNOSIS — M1990 Unspecified osteoarthritis, unspecified site: Secondary | ICD-10-CM | POA: Diagnosis not present

## 2020-03-26 DIAGNOSIS — Z87891 Personal history of nicotine dependence: Secondary | ICD-10-CM | POA: Diagnosis not present

## 2020-03-26 DIAGNOSIS — Z8551 Personal history of malignant neoplasm of bladder: Secondary | ICD-10-CM | POA: Diagnosis not present

## 2020-03-26 DIAGNOSIS — Z8546 Personal history of malignant neoplasm of prostate: Secondary | ICD-10-CM | POA: Diagnosis not present

## 2020-03-26 DIAGNOSIS — Z7901 Long term (current) use of anticoagulants: Secondary | ICD-10-CM | POA: Diagnosis not present

## 2020-03-26 DIAGNOSIS — J449 Chronic obstructive pulmonary disease, unspecified: Secondary | ICD-10-CM | POA: Diagnosis not present

## 2020-03-26 DIAGNOSIS — Z880 Allergy status to penicillin: Secondary | ICD-10-CM | POA: Diagnosis not present

## 2020-03-26 DIAGNOSIS — T191XXA Foreign body in bladder, initial encounter: Secondary | ICD-10-CM | POA: Diagnosis not present

## 2020-03-26 MED ORDER — HYDROCODONE-ACETAMINOPHEN 5-325 MG PO TABS
ORAL_TABLET | ORAL | Status: AC
  Filled 2020-03-26: qty 1

## 2020-03-26 MED ORDER — OXYBUTYNIN CHLORIDE 5 MG PO TABS
ORAL_TABLET | ORAL | Status: AC
  Filled 2020-03-26: qty 1

## 2020-03-26 NOTE — Progress Notes (Signed)
1 Day Post-Op -DISCHARGE NOTESubjective: Patient reports .  Patient feeling well this morning.  Minimal discomfort.  Patient is alert but has normal state of mild confusion due to dementia  Objective: Vital signs in last 24 hours: Temp:  [97.3 F (36.3 C)-99 F (37.2 C)] 99 F (37.2 C) (08/11 0602) Pulse Rate:  [56-97] 77 (08/11 0602) Resp:  [11-20] 18 (08/11 0602) BP: (108-168)/(55-99) 123/55 (08/11 0602) SpO2:  [94 %-100 %] 97 % (08/11 0602) Weight:  [68 kg] 68 kg (08/10 1153)  Intake/Output from previous day: 08/10 0701 - 08/11 0700 In: 8167.5 [P.O.:250; I.V.:1617.5] Out: 0762 [Urine:9430] Intake/Output this shift: No intake/output data recorded.  Physical Exam:  General:alert, appears stated age and no distress GI: not done Male genitalia: not done Foley in place draining light pink-tinged urine   Lab Results: No results for input(s): HGB, HCT in the last 72 hours. BMET No results for input(s): NA, K, CL, CO2, GLUCOSE, BUN, CREATININE, CALCIUM in the last 72 hours. No results for input(s): LABPT, INR in the last 72 hours. No results for input(s): LABURIN in the last 72 hours. Results for orders placed or performed during the hospital encounter of 03/22/20  SARS CORONAVIRUS 2 (TAT 6-24 HRS) Nasopharyngeal Nasopharyngeal Swab     Status: None   Collection Time: 03/22/20  1:41 PM   Specimen: Nasopharyngeal Swab  Result Value Ref Range Status   SARS Coronavirus 2 NEGATIVE NEGATIVE Final    Comment: (NOTE) SARS-CoV-2 target nucleic acids are NOT DETECTED.  The SARS-CoV-2 RNA is generally detectable in upper and lower respiratory specimens during the acute phase of infection. Negative results do not preclude SARS-CoV-2 infection, do not rule out co-infections with other pathogens, and should not be used as the sole basis for treatment or other patient management decisions. Negative results must be combined with clinical observations, patient history, and  epidemiological information. The expected result is Negative.  Fact Sheet for Patients: SugarRoll.be  Fact Sheet for Healthcare Providers: https://www.woods-mathews.com/  This test is not yet approved or cleared by the [place] FDA and  has been authorized for detection and/or diagnosis of SARS-CoV-2 by FDA under an Emergency Use Authorization (EUA). This EUA will remain  in effect (meaning this test can be used) for the duration of the COVID-19 declaration under Se ction 564(b)(1) of the Act, 21 U.S.C. section 360bbb-3(b)(1), unless the authorization is terminated or revoked sooner.  Performed at [facility], [place] [address] [place], [place] [address]     Studies/Results: No results found.  Assessment/Plan} Status post removal of bladder foreign body, doing well Plan/recommendation: Continue Foley until first of the week and see back at that time for Foley removal on 03/31/2020.   LOS: 0 days   Remi Haggard 03/26/2020, 7:45 AM

## 2020-03-26 NOTE — Discharge Summary (Signed)
Physician Discharge Summary  Patient ID: Connor Morris MRN: [id number] DOB/AGE: 84/21/1935 87 y.o.  Admit date: 03/25/2020 Discharge date: 03/26/2020  Admission Diagnoses: Retained foreign body in bladder  Discharge Diagnoses: Retained foreign body in bladder Active Problems:   Hematuria   Discharged Condition: stable  Hospital Course: Patient underwent cystoscopy and retrieval of bladder foreign body on 03/25/2020.  For details of procedure please see the typed operative note.  Patient required continuous bladder irrigation for hematuria following the procedure and this was weaned overnight.  Foley was draining clear to light-tinged urine off CBI first postoperative morning.  Felt ready for discharge home.  He will be discharged home with Foley catheter and return on 03/31/2020 for Foley removal.  Ultimately patient will need repeat cystoscopy to reassess bladder in about 1 month.  We will hold his Xarelto until follow-up due to hematuria risk.  Consults:   }  Treatments: Cystoscopy with retrieval of bladder foreign body  Discharge Exam: Blood pressure (!) 123/55, pulse 77, temperature 99 F (37.2 C), resp. rate 18, height 5\' 8"  (1.727 m), weight 68 kg, SpO2 97 %. Elderly African-American male in no acute distress heart is regular rate but irregular rhythm abdomen soft nontender  Disposition: Discharge disposition: 01-Home or Self Care       Discharge Instructions    Care order/instruction   Complete by: As directed    Place catheter plugged in irrigation port of the Foley.  Foley to gravity drain.  Family and patient will need to be instructed on routine Foley care.  Patient to be discharged home with Foley to gravity drain     Allergies as of 03/26/2020   No Known Allergies     Medication List    STOP taking these medications   Xarelto 20 MG Tabs tablet Generic drug: rivaroxaban     TAKE these medications   multivitamin with minerals tablet Take 1 tablet by mouth  daily.   traMADol 50 MG tablet Commonly known as: Ultram Take 1 tablet (50 mg total) by mouth every 6 (six) hours as needed for moderate pain.        Signed: Remi Haggard 03/26/2020, 7:52 AM

## 2020-03-26 NOTE — Discharge Instructions (Signed)
Indwelling Urinary Catheter Care, Adult An indwelling urinary catheter is a thin tube that is put into your bladder. The tube helps to drain pee (urine) out of your body. The tube goes in through your urethra. Your urethra is where pee comes out of your body. Your pee will come out through the catheter, then it will go into a bag (drainage bag). Take good care of your catheter so it will work well. How to wear your catheter and bag Supplies needed  Sticky tape (adhesive tape) or a leg strap.  Alcohol wipe or soap and water (if you use tape).  A clean towel (if you use tape).  Large overnight bag.  Smaller bag (leg bag). Wearing your catheter Attach your catheter to your leg with tape or a leg strap.  Make sure the catheter is not pulled tight.  If a leg strap gets wet, take it off and put on a dry strap.  If you use tape to hold the bag on your leg: 1. Use an alcohol wipe or soap and water to wash your skin where the tape made it sticky before. 2. Use a clean towel to pat-dry that skin. 3. Use new tape to make the bag stay on your leg. Wearing your bags You should have been given a large overnight bag.  You may wear the overnight bag in the day or night.  Always have the overnight bag lower than your bladder.  Do not let the bag touch the floor.  Before you go to sleep, put a clean plastic bag in a wastebasket. Then hang the overnight bag inside the wastebasket. You should also have a smaller leg bag that fits under your clothes.  Always wear the leg bag below your knee.  Do not wear your leg bag at night. How to care for your skin and catheter Supplies needed  A clean washcloth.  Water and mild soap.  A clean towel. Caring for your skin and catheter      Clean the skin around your catheter every day: 1. Wash your hands with soap and water. 2. Wet a clean washcloth in warm water and mild soap. 3. Clean the skin around your urethra.  If you are  male:  Gently spread the folds of skin around your vagina (labia).  With the washcloth in your other hand, wipe the inner side of your labia on each side. Wipe from front to back.  If you are male:  Pull back any skin that covers the end of your penis (foreskin).  With the washcloth in your other hand, wipe your penis in small circles. Start wiping at the tip of your penis, then move away from the catheter.  Move the foreskin back in place, if needed. 4. With your free hand, hold the catheter close to where it goes into your body.  Keep holding the catheter during cleaning so it does not get pulled out. 5. With the washcloth in your other hand, clean the catheter.  Only wipe downward on the catheter.  Do not wipe upward toward your body. Doing this may push germs into your urethra and cause infection. 6. Use a clean towel to pat-dry the catheter and the skin around it. Make sure to wipe off all soap. 7. Wash your hands with soap and water.  Shower every day. Do not take baths.  Do not use cream, ointment, or lotion on the area where the catheter goes into your body, unless your doctor tells you   to.  Do not use powders, sprays, or lotions on your genital area.  Check your skin around the catheter every day for signs of infection. Check for: ? Redness, swelling, or pain. ? Fluid or blood. ? Warmth. ? Pus or a bad smell. How to empty the bag Supplies needed  Rubbing alcohol.  Gauze pad or cotton ball.  Tape or a leg strap. Emptying the bag Pour the pee out of your bag when it is ?- full, or at least 2-3 times a day. Do this for your overnight bag and your leg bag. 1. Wash your hands with soap and water. 2. Separate (detach) the bag from your leg. 3. Hold the bag over the toilet or a clean pail. Keep the bag lower than your hips and bladder. This is so the pee (urine) does not go back into the tube. 4. Open the pour spout. It is at the bottom of the bag. 5. Empty the  pee into the toilet or pail. Do not let the pour spout touch any surface. 6. Put rubbing alcohol on a gauze pad or cotton ball. 7. Use the gauze pad or cotton ball to clean the pour spout. 8. Close the pour spout. 9. Attach the bag to your leg with tape or a leg strap. 10. Wash your hands with soap and water. Follow instructions for cleaning the drainage bag:  From the product maker.  As told by your doctor. How to change the bag Supplies needed  Alcohol wipes.  A clean bag.  Tape or a leg strap. Changing the bag Replace your bag when it starts to leak, smell bad, or look dirty. 1. Wash your hands with soap and water. 2. Separate the dirty bag from your leg. 3. Pinch the catheter with your fingers so that pee does not spill out. 4. Separate the catheter tube from the bag tube where these tubes connect (at the connection valve). Do not let the tubes touch any surface. 5. Clean the end of the catheter tube with an alcohol wipe. Use a different alcohol wipe to clean the end of the bag tube. 6. Connect the catheter tube to the tube of the clean bag. 7. Attach the clean bag to your leg with tape or a leg strap. Do not make the bag tight on your leg. 8. Wash your hands with soap and water. General rules   Never pull on your catheter. Never try to take it out. Doing that can hurt you.  Always wash your hands before and after you touch your catheter or bag. Use a mild, fragrance-free soap. If you do not have soap and water, use hand sanitizer.  Always make sure there are no twists or bends (kinks) in the catheter tube.  Always make sure there are no leaks in the catheter or bag.  Drink enough fluid to keep your pee pale yellow.  Do not take baths, swim, or use a hot tub.  If you are male, wipe from front to back after you poop (have a bowel movement). Contact a doctor if:  Your pee is cloudy.  Your pee smells worse than usual.  Your catheter gets clogged.  Your catheter  leaks.  Your bladder feels full. Get help right away if:  You have redness, swelling, or pain where the catheter goes into your body.  You have fluid, blood, pus, or a bad smell coming from the area where the catheter goes into your body.  Your skin feels warm where   the catheter goes into your body.  You have a fever.  You have pain in your: ? Belly (abdomen). ? Legs. ? Lower back. ? Bladder.  You see blood in the catheter.  Your pee is pink or red.  You feel sick to your stomach (nauseous).  You throw up (vomit).  You have chills.  Your pee is not draining into the bag.  Your catheter gets pulled out. Summary  An indwelling urinary catheter is a thin tube that is placed into the bladder to help drain pee (urine) out of the body.  The catheter is placed into the part of the body that drains pee from the bladder (urethra).  Taking good care of your catheter will keep it working properly and help prevent problems.  Always wash your hands before and after touching your catheter or bag.  Never pull on your catheter or try to take it out. This information is not intended to replace advice given to you by your health care provider. Make sure you discuss any questions you have with your health care provider. Document Revised: 11/24/2018 Document Reviewed: 03/18/2017 Elsevier Patient Education  [place]. Cystoscopy Cystoscopy is a procedure that is used to help diagnose and sometimes treat conditions that affect the lower urinary tract. The lower urinary tract includes the bladder and the urethra. The urethra is the tube that drains urine from the bladder. Cystoscopy is done using a thin, tube-shaped instrument with a light and camera at the end (cystoscope). The cystoscope may be hard or flexible, depending on the goal of the procedure. The cystoscope is inserted through the urethra, into the bladder. Cystoscopy may be recommended if you have:  Urinary tract  infections that keep coming back.  Blood in the urine (hematuria).  An inability to control when you urinate (urinary incontinence) or an overactive bladder.  Unusual cells found in a urine sample.  A blockage in the urethra, such as a urinary stone.  Painful urination.  An abnormality in the bladder found during an intravenous pyelogram (IVP) or CT scan. Cystoscopy may also be done to remove a sample of tissue to be examined under a microscope (biopsy). Tell a health care provider about:  Any allergies you have.  All medicines you are taking, including vitamins, herbs, eye drops, creams, and over-the-counter medicines.  Any problems you or family members have had with anesthetic medicines.  Any blood disorders you have.  Any surgeries you have had.  Any medical conditions you have.  Whether you are pregnant or may be pregnant. What are the risks? Generally, this is a safe procedure. However, problems may occur, including:  Infection.  Bleeding.  Allergic reactions to medicines.  Damage to other structures or organs. What happens before the procedure?  Ask your health care provider about: ? Changing or stopping your regular medicines. This is especially important if you are taking diabetes medicines or blood thinners. ? Taking medicines such as aspirin and ibuprofen. These medicines can thin your blood. Do not take these medicines unless your health care provider tells you to take them. ? Taking over-the-counter medicines, vitamins, herbs, and supplements.  Follow instructions from your health care provider about eating or drinking restrictions.  Ask your health care provider what steps will be taken to help prevent infection. These may include: ? Washing skin with a germ-killing soap. ? Taking antibiotic medicine.  You may have an exam or testing, such as: ? X-rays of the bladder, urethra, or kidneys. ? Urine  tests to check for signs of infection.  Plan to  have someone take you home from the hospital or clinic. What happens during the procedure?   You will be given one or more of the following: ? A medicine to help you relax (sedative). ? A medicine to numb the area (local anesthetic).  The area around the opening of your urethra will be cleaned.  The cystoscope will be passed through your urethra into your bladder.  Germ-free (sterile) fluid will flow through the cystoscope to fill your bladder. The fluid will stretch your bladder so that your health care provider can clearly examine your bladder walls.  Your doctor will look at the urethra and bladder. Your doctor may take a biopsy or remove stones.  The cystoscope will be removed, and your bladder will be emptied. The procedure may vary among health care providers and hospitals. What can I expect after the procedure? After the procedure, it is common to have:  Some soreness or pain in your abdomen and urethra.  Urinary symptoms. These include: ? Mild pain or burning when you urinate. Pain should stop within a few minutes after you urinate. This may last for up to 1 week. ? A small amount of blood in your urine for several days. ? Feeling like you need to urinate but producing only a small amount of urine. Follow these instructions at home: Medicines  Take over-the-counter and prescription medicines only as told by your health care provider.  If you were prescribed an antibiotic medicine, take it as told by your health care provider. Do not stop taking the antibiotic even if you start to feel better. General instructions  Return to your normal activities as told by your health care provider. Ask your health care provider what activities are safe for you.  Do not drive for 24 hours if you were given a sedative during your procedure.  Watch for any blood in your urine. If the amount of blood in your urine increases, call your health care provider.  Follow instructions from your  health care provider about eating or drinking restrictions.  If a tissue sample was removed for testing (biopsy) during your procedure, it is up to you to get your test results. Ask your health care provider, or the department that is doing the test, when your results will be ready.  Drink enough fluid to keep your urine pale yellow.  Keep all follow-up visits as told by your health care provider. This is important. Contact a health care provider if you:  Have pain that gets worse or does not get better with medicine, especially pain when you urinate.  Have trouble urinating.  Have more blood in your urine. Get help right away if you:  Have blood clots in your urine.  Have abdominal pain.  Have a fever or chills.  Are unable to urinate. Summary  Cystoscopy is a procedure that is used to help diagnose and sometimes treat conditions that affect the lower urinary tract.  Cystoscopy is done using a thin, tube-shaped instrument with a light and camera at the end.  After the procedure, it is common to have some soreness or pain in your abdomen and urethra.  Watch for any blood in your urine. If the amount of blood in your urine increases, call your health care provider.  If you were prescribed an antibiotic medicine, take it as told by your health care provider. Do not stop taking the antibiotic even if you start to  feel better. This information is not intended to replace advice given to you by your health care provider. Make sure you discuss any questions you have with your health care provider. Document Revised: 07/25/2018 Document Reviewed: 07/25/2018 Elsevier Patient Education  [place].

## 2020-03-30 NOTE — Progress Notes (Signed)
Cardiology Office Note:    Date:  03/31/2020   ID:  Connor Morris, DOB Jun 12, 1934, MRN [id number]  PCP:  Velna Hatchet, MD  Cardiologist:  Mertie Moores, MD  Electrophysiologist:  None   Referring MD: Velna Hatchet, MD   Chief Complaint:  Follow-up (Bradycardia)    Patient Profile:    Connor Morris is a 86 y.o. male with:   Bradycardia  1st degree AVB  Prostate CA, s/p radiation seeds  Bladder CA   Hyperlipidemia   Thoracic aortic aneurysm  CT 6/21: TAA 4.6 cm; aortic atherosclerosis   Pulmonary embolism, admx in 03/2018 >> Rivaroxaban   Prior CV studies: Echocardiogram 04/06/18 Mod LVH, EF 60-65, no RWMA, Gr 1 DD, mild AI, mild dilated Ao root, mod LAE  Echocardiogram 12/03/16 EF 55-60, Gr 1 DD, mild AS (mean 10), mild to mod AI, mild LAE    History of Present Illness:    Connor Morris was last seen by Dr. Acie Fredrickson in 08/2017.  He returns for follow up.   He is here with his son.  He has not had syncope, near syncope, chest pain, significant shortness of breath.  He has had some leg swelling.  This was worse a few months ago but is now back to baseline.  He had a recent bladder procedure performed by urology.  He is currently off of Rivaroxaban.  His son notes that it will be resumed later this week.  Past Medical History:  Diagnosis Date  . 1st degree AV block   . AAA (abdominal aortic aneurysm) ([place]) 03/2018   followed by dr Cyndia Bent--- last CTA  4.6cm  . Anticoagulated 03/2018   xarelto for hx pe/ dvt--- managed by pcp  . Arthritis   . Emphysema lung ([place])   . Hiatal hernia   . History of bladder cancer    urologist--- dr Milford Cage   s/p TURBT feb and march 2021  . History of DVT of lower extremity 03/2018   LLE  . History of prostate cancer 2002   s/p radioactive seed implants 04-11-2001  . History of pulmonary embolus (PE) 03/2018   left side--  on xarelto  . History of sepsis 01/30/2020   hospital admission in epic;  due to UTI  . Impaired memory   .  Retained ureteral stent    right side  . Sinus bradycardia 2018   cardiologist--- dr Cathie Olden  . Urgency of urination     Current Medications: Current Meds  Medication Sig  . Multiple Vitamins-Minerals (MULTIVITAMIN WITH MINERALS) tablet Take 1 tablet by mouth daily.   . traMADol (ULTRAM) 50 MG tablet Take 1 tablet (50 mg total) by mouth every 6 (six) hours as needed for moderate pain.     Allergies:   Patient has no known allergies.   Social History   Tobacco Use  . Smoking status: Former Smoker    Packs/day: 1.00    Years: 20.00    Pack years: 20.00    Types: Cigarettes    Quit date: 09/14/1985    Years since quitting: 34.5  . Smokeless tobacco: Never Used  Vaping Use  . Vaping Use: Never used  Substance Use Topics  . Alcohol use: No  . Drug use: Never     Family Hx: The patient's family history includes Diabetes in his sister; Healthy in his father; Hypertension in his mother. There is no history of Colon cancer.  ROS   EKGs/Labs/Other Test Reviewed:    EKG:  EKG is not ordered  today.  The ekg ordered today demonstrates n/a I did review his chart and an electrocardiogram performed 01/30/2020 demonstrated sinus rhythm, heart rate 89, left axis deviation and no acute changes.  An electrocardiogram dated 09/20/2019 demonstrated sinus rhythm, heart rate 65, first-degree AV block with a PR interval of 212 ms and left axis deviation.  Recent Labs: 01/30/2020: B Natriuretic Peptide 100.8 01/31/2020: ALT 14 02/02/2020: Magnesium 1.9 02/03/2020: BUN 12; Creatinine, Ser 0.97; Hemoglobin 12.0; Platelets 251; Potassium 4.2; Sodium 137   Recent Lipid Panel No results found for: CHOL, TRIG, HDL, CHOLHDL, LDLCALC, LDLDIRECT  Physical Exam:    VS:  BP (!) 120/40   Pulse (!) 50   Ht 5\' 8"  (1.727 m)   Wt 142 lb (64.4 kg)   SpO2 95%   BMI 21.59 kg/m     Wt Readings from Last 3 Encounters:  03/31/20 142 lb (64.4 kg)  03/25/20 149 lb 14.6 oz (68 kg)  02/03/20 153 lb (69.4 kg)      Constitutional:      Appearance: Healthy appearance. Not in distress.  Neck:     Vascular: JVD normal.  Pulmonary:     Effort: Pulmonary effort is normal.     Breath sounds: No wheezing. No rales.  Cardiovascular:     Normal rate. Regular rhythm. Normal S1. Normal S2.     Murmurs: There is a grade 2/6 early systolic murmur at the URSB.  Edema:    Pretibial: bilateral trace pitting edema of the pretibial area.    Ankle: bilateral trace edema of the ankle. Abdominal:     Palpations: Abdomen is soft.  Skin:    General: Skin is warm and dry.  Neurological:     General: No focal deficit present.     Mental Status: Alert and oriented to person, place and time.     Cranial Nerves: Cranial nerves are intact.      ASSESSMENT & PLAN:    1. Sinus bradycardia 2. 1st degree AV block He remains asymptomatic.  His most recent electrocardiograms have not demonstrated significant changes.  Follow-up 1 year.  We discussed symptoms that would prompt earlier follow-up.  3. History of pulmonary embolism He is on chronic anticoagulation which is managed by primary care.  This is currently on hold due to recent bladder procedure.  I have asked his son to contact us with the dose of Rivaroxaban he takes so that we can update his chart.  4. Thoracic ascending aortic aneurysm [facility]) He is followed by Dr. Cyndia Bent.  CT in June 2021 demonstrated stable measurement 4.6 cm.  BP is well controlled.     Dispo:  No follow-ups on file.   Medication Adjustments/Labs and Tests Ordered: Current medicines are reviewed at length with the patient today.  Concerns regarding medicines are outlined above.  Tests Ordered: No orders of the defined types were placed in this encounter.  Medication Changes: No orders of the defined types were placed in this encounter.   Signed, Richardson Dopp, PA-C  03/31/2020 11:25 AM    [facility] [address], [place], [place]  [address] Phone: [phone]; Fax: [phone]

## 2020-03-31 ENCOUNTER — Other Ambulatory Visit: Payer: Self-pay

## 2020-03-31 ENCOUNTER — Ambulatory Visit: Payer: Medicare HMO | Admitting: Physician Assistant

## 2020-03-31 ENCOUNTER — Encounter: Payer: Self-pay | Admitting: Physician Assistant

## 2020-03-31 VITALS — BP 120/40 | HR 50 | Ht 68.0 in | Wt 142.0 lb

## 2020-03-31 DIAGNOSIS — I712 Thoracic aortic aneurysm, without rupture: Secondary | ICD-10-CM

## 2020-03-31 DIAGNOSIS — C61 Malignant neoplasm of prostate: Secondary | ICD-10-CM | POA: Diagnosis not present

## 2020-03-31 DIAGNOSIS — I7121 Aneurysm of the ascending aorta, without rupture: Secondary | ICD-10-CM

## 2020-03-31 DIAGNOSIS — I440 Atrioventricular block, first degree: Secondary | ICD-10-CM

## 2020-03-31 DIAGNOSIS — Z86711 Personal history of pulmonary embolism: Secondary | ICD-10-CM | POA: Diagnosis not present

## 2020-03-31 DIAGNOSIS — C672 Malignant neoplasm of lateral wall of bladder: Secondary | ICD-10-CM | POA: Diagnosis not present

## 2020-03-31 DIAGNOSIS — R001 Bradycardia, unspecified: Secondary | ICD-10-CM

## 2020-03-31 NOTE — Patient Instructions (Addendum)
Medication Instructions:  Your physician recommends that you continue on your current medications as directed. Please refer to the Current Medication list given to you today.  *If you need a refill on your cardiac medications before your next appointment, please call your pharmacy*  Lab Work: None ordered today  Testing/Procedures: None ordered today  Follow-Up: At [facility], you and your health needs are our priority.  As part of our continuing mission to provide you with exceptional heart care, we have created designated Provider Care Teams.  These Care Teams include your primary Cardiologist (physician) and Advanced Practice Providers (APPs -  Physician Assistants and Nurse Practitioners) who all work together to provide you with the care you need, when you need it.  Your next appointment:   12 month(s)  The format for your next appointment:   In Person  Provider:   Mertie Moores, MD  Other Instructions Call [phone] to let our office know the dose of Xarelto

## 2020-04-01 ENCOUNTER — Telehealth: Payer: Self-pay | Admitting: Physician Assistant

## 2020-04-01 NOTE — Telephone Encounter (Signed)
Medication list updated and sent to [employer], PA-C.

## 2020-04-01 NOTE — Telephone Encounter (Signed)
New Message  Patient is calling to update his medication list.   Medications he is currently taking: Tramadol 50 mg (1 tablet every 6 hrs as needed) Xarelto 20 mg (once daily) Men's Multivitamin (once daily)

## 2020-04-01 NOTE — Telephone Encounter (Signed)
Thanks, Richardson Dopp, PA-C    04/01/2020 10:39 AM

## 2020-05-09 DIAGNOSIS — Z8546 Personal history of malignant neoplasm of prostate: Secondary | ICD-10-CM | POA: Diagnosis not present

## 2020-05-09 DIAGNOSIS — C672 Malignant neoplasm of lateral wall of bladder: Secondary | ICD-10-CM | POA: Diagnosis not present

## 2020-06-03 DIAGNOSIS — Z125 Encounter for screening for malignant neoplasm of prostate: Secondary | ICD-10-CM | POA: Diagnosis not present

## 2020-06-03 DIAGNOSIS — E559 Vitamin D deficiency, unspecified: Secondary | ICD-10-CM | POA: Diagnosis not present

## 2020-06-03 DIAGNOSIS — E785 Hyperlipidemia, unspecified: Secondary | ICD-10-CM | POA: Diagnosis not present

## 2020-06-10 DIAGNOSIS — Z0000 Encounter for general adult medical examination without abnormal findings: Secondary | ICD-10-CM | POA: Diagnosis not present

## 2020-06-10 DIAGNOSIS — E559 Vitamin D deficiency, unspecified: Secondary | ICD-10-CM | POA: Diagnosis not present

## 2020-06-10 DIAGNOSIS — E785 Hyperlipidemia, unspecified: Secondary | ICD-10-CM | POA: Diagnosis not present

## 2020-06-10 DIAGNOSIS — C61 Malignant neoplasm of prostate: Secondary | ICD-10-CM | POA: Diagnosis not present

## 2020-06-10 DIAGNOSIS — D692 Other nonthrombocytopenic purpura: Secondary | ICD-10-CM | POA: Diagnosis not present

## 2020-06-10 DIAGNOSIS — Z23 Encounter for immunization: Secondary | ICD-10-CM | POA: Diagnosis not present

## 2020-06-30 ENCOUNTER — Other Ambulatory Visit: Payer: Self-pay | Admitting: Surgery

## 2020-06-30 DIAGNOSIS — I712 Thoracic aortic aneurysm, without rupture, unspecified: Secondary | ICD-10-CM

## 2020-07-01 DIAGNOSIS — H401131 Primary open-angle glaucoma, bilateral, mild stage: Secondary | ICD-10-CM | POA: Diagnosis not present

## 2020-07-01 DIAGNOSIS — H2512 Age-related nuclear cataract, left eye: Secondary | ICD-10-CM | POA: Diagnosis not present

## 2020-07-01 DIAGNOSIS — Z961 Presence of intraocular lens: Secondary | ICD-10-CM | POA: Diagnosis not present

## 2020-07-30 ENCOUNTER — Ambulatory Visit: Payer: Medicare HMO | Admitting: Surgery

## 2020-08-01 DIAGNOSIS — C672 Malignant neoplasm of lateral wall of bladder: Secondary | ICD-10-CM | POA: Diagnosis not present

## 2020-08-01 DIAGNOSIS — N35013 Post-traumatic anterior urethral stricture: Secondary | ICD-10-CM | POA: Diagnosis not present

## 2020-08-01 DIAGNOSIS — C61 Malignant neoplasm of prostate: Secondary | ICD-10-CM | POA: Diagnosis not present

## 2020-08-06 ENCOUNTER — Ambulatory Visit: Payer: Medicare HMO | Admitting: Surgery

## 2020-08-06 ENCOUNTER — Other Ambulatory Visit: Payer: Self-pay

## 2020-08-06 ENCOUNTER — Ambulatory Visit
Admission: RE | Admit: 2020-08-06 | Discharge: 2020-08-06 | Disposition: A | Discharge status: Home / Self Care | Payer: Medicare HMO | Source: Ambulatory Visit | Attending: Surgery | Admitting: Surgery

## 2020-08-06 ENCOUNTER — Encounter: Payer: Self-pay | Admitting: Surgery

## 2020-08-06 VITALS — BP 146/74 | HR 68 | Resp 20 | Ht 68.0 in | Wt 142.0 lb

## 2020-08-06 DIAGNOSIS — I712 Thoracic aortic aneurysm, without rupture, unspecified: Secondary | ICD-10-CM

## 2020-08-06 MED ORDER — IOPAMIDOL (ISOVUE-370) INJECTION 76%
75.0000 mL | Freq: Once | INTRAVENOUS | Status: AC | PRN
  Administered 2020-08-06: 09:00:00 75 mL via INTRAVENOUS

## 2020-08-06 NOTE — Progress Notes (Signed)
HPI:  The patient is an 86 year old gentleman who returns for follow-up of a 4.6 cm fusiform ascending aortic aneurysm diagnosed on CTA of the chest in August 2019 after he presented with left lower extremity DVT and PE.  Since I saw him last year he has had surgery for bladder cancer.  He has lost weight.  He denies any chest or back pain.  Current Outpatient Medications  Medication Sig Dispense Refill  . Multiple Vitamins-Minerals (MULTIVITAMIN WITH MINERALS) tablet Take 1 tablet by mouth daily.     . rivaroxaban (XARELTO) 20 MG TABS tablet Take 20 mg by mouth daily with supper.    . traMADol (ULTRAM) 50 MG tablet Take 1 tablet (50 mg total) by mouth every 6 (six) hours as needed for moderate pain. 15 tablet 0   No current facility-administered medications for this visit.     Physical Exam: BP (!) 146/74 (BP Location: Right Arm, Patient Position: Sitting)   Pulse 68   Resp 20   Ht 5\' 8"  (1.727 m)   Wt 142 lb (64.4 kg)   SpO2 100% Comment: RA with mask on  BMI 21.59 kg/m  He has an elderly frail-appearing gentleman in no distress. Cardiac exam shows a regular rate and rhythm with normal heart sounds.  There is no murmur. Lungs are clear.  Diagnostic Tests:  CLINICAL DATA:  Thoracic aorta aneurysm follow-up  EXAM: CT ANGIOGRAPHY CHEST WITH CONTRAST  TECHNIQUE: Multidetector CT imaging of the chest was performed using the standard protocol during bolus administration of intravenous contrast. Multiplanar CT image reconstructions and MIPs were obtained to evaluate the vascular anatomy.  CONTRAST:  2mL ISOVUE-370 IOPAMIDOL (ISOVUE-370) INJECTION 76%  Creatinine was obtained on site at [place] at [address]  Results: Creatinine 1.2 mg/dL.  COMPARISON:  01/30/2020  FINDINGS: Cardiovascular: Stable size of ascending thoracic aorta measuring 4.6 cm. Stable plaque along the thoracic aorta and patent great vessel origins. Stable  irregularity at the innominate artery origin with ulcerated plaque. State moderate stenosis of the proximal left subclavian artery. Normal heart size. No pericardial effusion. Aortic valvular calcification.  Mediastinum/Nodes: No enlarged lymph nodes. Included thyroid is unremarkable.  Lungs/Pleura: Emphysema. No consolidation or mass. No pleural effusion.  Upper Abdomen: No acute abnormality.  Musculoskeletal: No acute osseous abnormality. Osseous demineralization. Chronic compression fractures of L1 and L2. Exaggerated thoracic kyphosis.  Review of the MIP images confirms the above findings.  IMPRESSION: Stable 4.6 cm ascending aorta aneurysm. Continued semi-annual imaging follow-up by CTA or MRA is recommended.  Aortic Atherosclerosis (ICD10-I70.0) and Emphysema (ICD10-J43.9).   Electronically Signed   By: Macy Mis M.D.   On: 08/06/2020 09:22  Impression:  He has a stable 4.6 cm fusiform ascending aortic aneurysm which has not changed since it was diagnosed in 2019.  This is well below the surgical threshold of 5.5 cm.  He is a frail 86 year old gentleman who I do not think would be a candidate for aneurysm resection even if this did enlarge to surgical dimensions.  I think the likelihood of that is still very low.  I reviewed the CTA images with the patient and his family.  I discussed the importance of continued good blood pressure control and preventing further enlargement and acute aortic dissection.  Plan:  I think it is reasonable to wait 2 years to follow-up on his aneurysm given his advanced age and relatively small size with stability since 2019.  He seems significantly more frail than when  I saw him a year ago and if this continues there is probably no reason to do a follow-up scan.  He and his family are in agreement with that.  I spent 20 minutes performing this established patient evaluation and > 50% of this time was spent face to face counseling  and coordinating the care of this patient's aortic aneurysm.    Gaye Pollack, MD [facility] [phone]

## 2020-10-02 DIAGNOSIS — H401131 Primary open-angle glaucoma, bilateral, mild stage: Secondary | ICD-10-CM | POA: Diagnosis not present

## 2020-10-29 DIAGNOSIS — C672 Malignant neoplasm of lateral wall of bladder: Secondary | ICD-10-CM | POA: Diagnosis not present

## 2020-10-29 DIAGNOSIS — N35013 Post-traumatic anterior urethral stricture: Secondary | ICD-10-CM | POA: Diagnosis not present

## 2020-10-29 DIAGNOSIS — C61 Malignant neoplasm of prostate: Secondary | ICD-10-CM | POA: Diagnosis not present

## 2020-12-30 DIAGNOSIS — H401131 Primary open-angle glaucoma, bilateral, mild stage: Secondary | ICD-10-CM | POA: Diagnosis not present

## 2020-12-30 DIAGNOSIS — Z961 Presence of intraocular lens: Secondary | ICD-10-CM | POA: Diagnosis not present

## 2020-12-30 DIAGNOSIS — H2512 Age-related nuclear cataract, left eye: Secondary | ICD-10-CM | POA: Diagnosis not present

## 2021-01-28 DIAGNOSIS — N35013 Post-traumatic anterior urethral stricture: Secondary | ICD-10-CM | POA: Diagnosis not present

## 2021-01-28 DIAGNOSIS — C672 Malignant neoplasm of lateral wall of bladder: Secondary | ICD-10-CM | POA: Diagnosis not present

## 2021-03-01 IMAGING — DX DG CHEST 1V PORT
1 series · 1 of 1 positions shown · non-contrast
Comparison: 04/04/2018

CLINICAL DATA: Chest pain and shortness of breath

EXAM:
PORTABLE CHEST 1 VIEW

[chest ap]
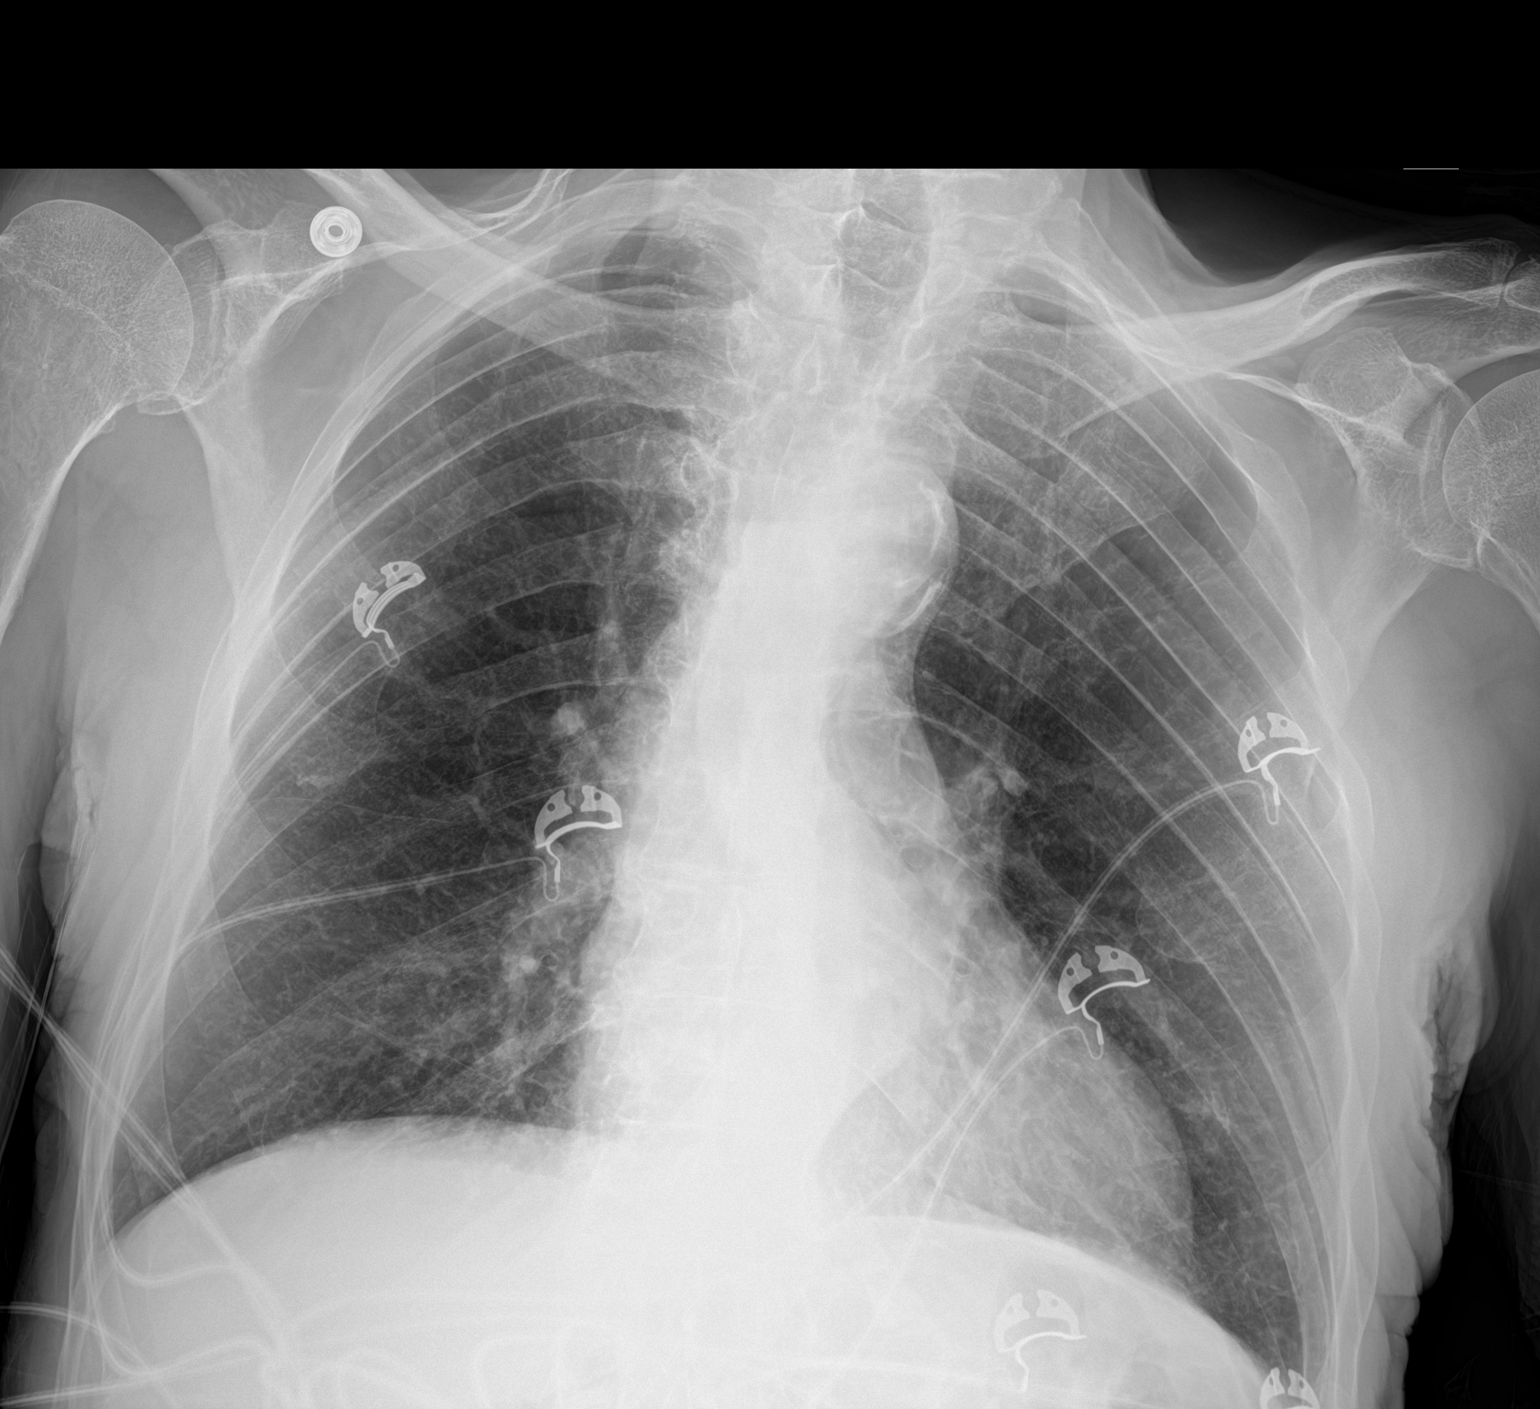

[1 of 1 positions shown; findings below may reference images not displayed]

FINDINGS: Single frontal view of the chest demonstrates an unremarkable
cardiac silhouette. Stable thoracic aortic ectasia and
atherosclerosis. No acute airspace disease, effusion, or
pneumothorax. Stable background emphysema.
IMPRESSION: 1. Stable emphysema.  No acute process.

## 2021-04-13 ENCOUNTER — Other Ambulatory Visit: Payer: Self-pay

## 2021-04-13 ENCOUNTER — Ambulatory Visit: Payer: Medicare HMO | Admitting: Cardiovascular Disease

## 2021-04-13 ENCOUNTER — Encounter: Payer: Self-pay | Admitting: Cardiovascular Disease

## 2021-04-13 VITALS — BP 122/68 | HR 74 | Ht 68.0 in | Wt 136.4 lb

## 2021-04-13 DIAGNOSIS — J439 Emphysema, unspecified: Secondary | ICD-10-CM | POA: Diagnosis not present

## 2021-04-13 DIAGNOSIS — I440 Atrioventricular block, first degree: Secondary | ICD-10-CM

## 2021-04-13 DIAGNOSIS — R001 Bradycardia, unspecified: Secondary | ICD-10-CM

## 2021-04-13 DIAGNOSIS — Z86711 Personal history of pulmonary embolism: Secondary | ICD-10-CM | POA: Diagnosis not present

## 2021-04-13 DIAGNOSIS — I712 Thoracic aortic aneurysm, without rupture: Secondary | ICD-10-CM | POA: Diagnosis not present

## 2021-04-13 DIAGNOSIS — I7121 Aneurysm of the ascending aorta, without rupture: Secondary | ICD-10-CM

## 2021-04-13 NOTE — Patient Instructions (Signed)
Medication Instructions:  Your physician recommends that you continue on your current medications as directed. Please refer to the Current Medication list given to you today.  *If you need a refill on your cardiac medications before your next appointment, please call your pharmacy*   Lab Work: None Ordered If you have labs (blood work) drawn today and your tests are completely normal, you will receive your results only by: [place] (if you have MyChart) OR A paper copy in the mail If you have any lab test that is abnormal or we need to change your treatment, we will call you to review the results.   Testing/Procedures: None Ordered   Follow-Up: At [facility], you and your health needs are our priority.  As part of our continuing mission to provide you with exceptional heart care, we have created designated Provider Care Teams.  These Care Teams include your primary Cardiologist (physician) and Advanced Practice Providers (APPs -  Physician Assistants and Nurse Practitioners) who all work together to provide you with the care you need, when you need it.    Your next appointment:   1 year(s)  The format for your next appointment:   In Person  Provider:   Richardson Dopp, PA-C

## 2021-04-13 NOTE — Progress Notes (Signed)
Cardiology Office Note   Date:  04/13/2021   ID:  Connor Morris, DOB [birth year], MRN [id number]  PCP:  Connor Hatchet, MD  Cardiologist:   Mertie Moores, MD   Chief Complaint  Patient presents with   Bradycardia        Connor Morris is a 87 y.o. male who presents for Evaluation of his bradycardia.  Seen for the first time , request from [facility])  No hx of syncope  No CP or dyspnea   Retired from the [employer] - [employer].  Goes to the [facility] - sometimes 5 days a week .  No CP or dyspnea   Dec 14, 2016:  Doing well.  Seen with Connor Morris, daughter.   Occasional dizzy spells - sound like orthostatic hypotension. Also has occasional dizzy spell after walking around the track.  Has occurred 1-2 times this month  Nov. 6 2018:    Doing well ,  No syncope  Felt dizzy walking around the track .  No CP or dyspnea   Jan 02, 2018: Mr. Connor Morris He also has a history of hyperlipidemia.  Seen today for follow-up of his bradycardia. No syncope or presyncope ,  Had a remote episode of dizziness  April 13, 2021: seen with Connor Morris ( son) Connor Morris is seen today for follow-up visit.  I saw him last in May 2019 for asymptomatic bradycardia.   He is was seen by Connor Dopp,   PA last year.  His heart rate is much better today.  Feeling well.   Past Medical History:  Diagnosis Date   1st degree AV block    AAA (abdominal aortic aneurysm) ([place]) 03/2018   followed by dr Connor Morris--- last CTA  4.6cm   Anticoagulated 03/2018   xarelto for hx pe/ dvt--- managed by pcp   Arthritis    Emphysema lung ([place])    Hiatal hernia    History of bladder cancer    urologist--- dr Connor Morris   s/p TURBT feb and march 2021   History of DVT of lower extremity 03/2018   LLE   History of prostate cancer 2002   s/p radioactive seed implants 04-11-2001   History of pulmonary embolus ([place]) 03/2018   left side--  on xarelto   History of sepsis 01/30/2020   hospital admission  in epic;  due to UTI   Impaired memory    Retained ureteral stent    right side   Sinus bradycardia 2018   cardiologist--- dr Connor Morris   Urgency of urination     Past Surgical History:  Procedure Laterality Date   CATARACT EXTRACTION W/ INTRAOCULAR LENS IMPLANT Right 2020   CYSTOSCOPY N/A 10/30/2019   Procedure: CYSTOSCOPY;  Surgeon: Connor Gustin, MD;  Location[facility];  Service: Urology;  Laterality: N/A;   CYSTOSCOPY W/ RETROGRADES Bilateral 09/20/2019   Procedure: CYSTOSCOPY WITH RETROGRADE PYELOGRAM;  Surgeon: Connor Gustin, MD;  Location: [facility];  Service: Urology;  Laterality: Bilateral;   CYSTOSCOPY W/ URETERAL STENT REMOVAL N/A 03/25/2020   Procedure: CYSTOSCOPY WITH  REMOVAL OF RETAINED FOLEY CATHETER,  FULGERATION;  Surgeon: Connor Haggard, MD;  Location: [facility];  Service: Urology;  Laterality: N/A;   FOOT SURGERY  yrs ago   RADIOACTIVE PROSTATE SEED IMPLANTS  04-11-2001  dr Connor Morris  '@[facility]'$    TRANSURETHRAL RESECTION OF BLADDER TUMOR N/A 09/20/2019   Procedure: TRANSURETHRAL RESECTION OF BLADDER TUMOR (TURBT);  Surgeon: Connor Gustin, MD;  Location: [facility];  Service: Urology;  Laterality: N/A;  1 HR   TRANSURETHRAL RESECTION OF BLADDER TUMOR N/A 10/30/2019   Procedure: TRANSURETHRAL RESECTION OF BLADDER TUMOR (TURBT), REMOVAL OF RIGHT STENT;  Surgeon: Connor Gustin, MD;  Location: [facility];  Service: Urology;  Laterality: N/A;     Current Outpatient Medications  Medication Sig Dispense Refill   Multiple Vitamins-Minerals (MULTIVITAMIN WITH MINERALS) tablet Take 1 tablet by mouth daily.      rivaroxaban (XARELTO) 20 MG TABS tablet Take 20 mg by mouth daily with supper.     No current facility-administered medications for this visit.    Allergies:   Patient has no known allergies.    Social History:  The patient  reports that he quit smoking about 35 years ago.  His smoking use included cigarettes. He has a 20.00 pack-year smoking history. He has never used smokeless tobacco. He reports that he does not drink alcohol and does not use drugs.   Family History:  The patient's family history includes Diabetes in his sister; Healthy in his father; Hypertension in his mother.    ROS:  Please see the history of present illness.    Review of Systems: Noted in current history, otherwise review of systems is negative.   Physical Exam: Blood pressure 122/68, pulse 74, height '5\' 8"'$  (1.727 m), weight 136 lb 6.4 oz (61.9 kg).  GEN:  Well nourished, well developed in no acute distress HEENT: Normal NECK: No JVD; No carotid bruits LYMPHATICS: No lymphadenopathy CARDIAC: RRR , no murmurs, rubs, gallops RESPIRATORY:  Clear to auscultation without rales, wheezing or rhonchi  ABDOMEN: Soft, non-tender, non-distended MUSCULOSKELETAL:  No edema; No deformity  SKIN: Warm and dry NEUROLOGIC:  Alert and oriented x 3    EKG: April 13, 2021: Normal sinus rhythm.  Left ventricular hypertrophy.   Recent Labs: No results found for requested labs within last 8760 hours.    Lipid Panel No results found for: CHOL, TRIG, HDL, CHOLHDL, VLDL, LDLCALC, LDLDIRECT    Wt Readings from Last 3 Encounters:  04/13/21 136 lb 6.4 oz (61.9 kg)  08/06/20 142 lb (64.4 kg)  03/31/20 142 lb (64.4 kg)      Other studies Reviewed: Additional studies/ records that were reviewed today include: . Review of the above records demonstrates:    ASSESSMENT AND PLAN:  1.  Asymptomatic sinus bradycardia:   He is doing well.   HR is better Will have him see Connor Dopp, PA next year If his HR continues to be normal, we may have him just follow up with his primary MD and see [place] as needed.      Current medicines are reviewed at length with the patient today.  The patient does not have concerns regarding medicines.  Labs/ tests ordered today include:   Orders Placed This  Encounter  Procedures   EKG 12-Lead         Mertie Moores, MD  04/13/2021 11:34 AM    [facility] [place], [place], [place]  [address] Phone: [phone]; Fax: [phone]

## 2021-04-23 DIAGNOSIS — Z8546 Personal history of malignant neoplasm of prostate: Secondary | ICD-10-CM | POA: Diagnosis not present

## 2021-05-07 DIAGNOSIS — N35013 Post-traumatic anterior urethral stricture: Secondary | ICD-10-CM | POA: Diagnosis not present

## 2021-05-07 DIAGNOSIS — C672 Malignant neoplasm of lateral wall of bladder: Secondary | ICD-10-CM | POA: Diagnosis not present

## 2021-05-07 DIAGNOSIS — C61 Malignant neoplasm of prostate: Secondary | ICD-10-CM | POA: Diagnosis not present

## 2021-06-17 DIAGNOSIS — E559 Vitamin D deficiency, unspecified: Secondary | ICD-10-CM | POA: Diagnosis not present

## 2021-06-17 DIAGNOSIS — Z125 Encounter for screening for malignant neoplasm of prostate: Secondary | ICD-10-CM | POA: Diagnosis not present

## 2021-06-17 DIAGNOSIS — E785 Hyperlipidemia, unspecified: Secondary | ICD-10-CM | POA: Diagnosis not present

## 2021-06-17 DIAGNOSIS — I700 Atherosclerosis of aorta: Secondary | ICD-10-CM | POA: Diagnosis not present

## 2021-06-23 DIAGNOSIS — Z7189 Other specified counseling: Secondary | ICD-10-CM | POA: Diagnosis not present

## 2021-06-23 DIAGNOSIS — I700 Atherosclerosis of aorta: Secondary | ICD-10-CM | POA: Diagnosis not present

## 2021-06-23 DIAGNOSIS — J439 Emphysema, unspecified: Secondary | ICD-10-CM | POA: Diagnosis not present

## 2021-06-23 DIAGNOSIS — C679 Malignant neoplasm of bladder, unspecified: Secondary | ICD-10-CM | POA: Diagnosis not present

## 2021-06-23 DIAGNOSIS — Z1331 Encounter for screening for depression: Secondary | ICD-10-CM | POA: Diagnosis not present

## 2021-06-23 DIAGNOSIS — E559 Vitamin D deficiency, unspecified: Secondary | ICD-10-CM | POA: Diagnosis not present

## 2021-06-23 DIAGNOSIS — I712 Thoracic aortic aneurysm, without rupture, unspecified: Secondary | ICD-10-CM | POA: Diagnosis not present

## 2021-06-23 DIAGNOSIS — Z0000 Encounter for general adult medical examination without abnormal findings: Secondary | ICD-10-CM | POA: Diagnosis not present

## 2021-06-23 DIAGNOSIS — C61 Malignant neoplasm of prostate: Secondary | ICD-10-CM | POA: Diagnosis not present

## 2021-06-23 DIAGNOSIS — E785 Hyperlipidemia, unspecified: Secondary | ICD-10-CM | POA: Diagnosis not present

## 2021-06-25 DIAGNOSIS — H2512 Age-related nuclear cataract, left eye: Secondary | ICD-10-CM | POA: Diagnosis not present

## 2021-06-25 DIAGNOSIS — Z961 Presence of intraocular lens: Secondary | ICD-10-CM | POA: Diagnosis not present

## 2021-06-25 DIAGNOSIS — H401131 Primary open-angle glaucoma, bilateral, mild stage: Secondary | ICD-10-CM | POA: Diagnosis not present

## 2021-07-29 DIAGNOSIS — C61 Malignant neoplasm of prostate: Secondary | ICD-10-CM | POA: Diagnosis not present

## 2021-08-06 DIAGNOSIS — C672 Malignant neoplasm of lateral wall of bladder: Secondary | ICD-10-CM | POA: Diagnosis not present

## 2021-11-05 DIAGNOSIS — C672 Malignant neoplasm of lateral wall of bladder: Secondary | ICD-10-CM | POA: Diagnosis not present

## 2021-12-28 DIAGNOSIS — H401131 Primary open-angle glaucoma, bilateral, mild stage: Secondary | ICD-10-CM | POA: Diagnosis not present

## 2022-01-13 DIAGNOSIS — C61 Malignant neoplasm of prostate: Secondary | ICD-10-CM | POA: Diagnosis not present

## 2022-01-20 DIAGNOSIS — C672 Malignant neoplasm of lateral wall of bladder: Secondary | ICD-10-CM | POA: Diagnosis not present

## 2022-01-20 DIAGNOSIS — N35013 Post-traumatic anterior urethral stricture: Secondary | ICD-10-CM | POA: Diagnosis not present

## 2022-01-22 ENCOUNTER — Other Ambulatory Visit (HOSPITAL_COMMUNITY): Payer: Self-pay | Admitting: Urology

## 2022-01-22 DIAGNOSIS — R9721 Rising PSA following treatment for malignant neoplasm of prostate: Secondary | ICD-10-CM

## 2022-01-29 ENCOUNTER — Encounter (HOSPITAL_COMMUNITY)
Admission: RE | Admit: 2022-01-29 | Discharge: 2022-01-29 | Disposition: A | Discharge status: Home / Self Care | Payer: Medicare HMO | Source: Ambulatory Visit | Attending: Urology | Admitting: Urology

## 2022-01-29 DIAGNOSIS — J439 Emphysema, unspecified: Secondary | ICD-10-CM | POA: Diagnosis not present

## 2022-01-29 DIAGNOSIS — R9721 Rising PSA following treatment for malignant neoplasm of prostate: Secondary | ICD-10-CM | POA: Insufficient documentation

## 2022-01-29 DIAGNOSIS — I712 Thoracic aortic aneurysm, without rupture, unspecified: Secondary | ICD-10-CM | POA: Diagnosis not present

## 2022-01-29 DIAGNOSIS — J9811 Atelectasis: Secondary | ICD-10-CM | POA: Diagnosis not present

## 2022-01-29 DIAGNOSIS — C61 Malignant neoplasm of prostate: Secondary | ICD-10-CM | POA: Diagnosis not present

## 2022-01-29 MED ORDER — PIFLIFOLASTAT F 18 (PYLARIFY) INJECTION
9.0000 | Freq: Once | INTRAVENOUS | Status: AC
  Administered 2022-01-29: 8.97 via INTRAVENOUS

## 2022-02-05 DIAGNOSIS — C61 Malignant neoplasm of prostate: Secondary | ICD-10-CM | POA: Diagnosis not present

## 2022-02-05 DIAGNOSIS — C672 Malignant neoplasm of lateral wall of bladder: Secondary | ICD-10-CM | POA: Diagnosis not present

## 2022-02-12 DIAGNOSIS — C61 Malignant neoplasm of prostate: Secondary | ICD-10-CM | POA: Diagnosis not present

## 2022-02-12 DIAGNOSIS — Z5111 Encounter for antineoplastic chemotherapy: Secondary | ICD-10-CM | POA: Diagnosis not present

## 2022-03-07 ENCOUNTER — Encounter (HOSPITAL_COMMUNITY): Payer: Self-pay

## 2022-03-07 ENCOUNTER — Inpatient Hospital Stay (HOSPITAL_COMMUNITY)
Admission: EM | Admit: 2022-03-07 | Discharge: 2022-03-11 | DRG: 193 | Disposition: A | Discharge status: Skilled Nursing Facility | Payer: Medicare HMO | Attending: Internal Medicine | Admitting: Internal Medicine

## 2022-03-07 ENCOUNTER — Other Ambulatory Visit: Payer: Self-pay

## 2022-03-07 ENCOUNTER — Emergency Department (HOSPITAL_COMMUNITY): Payer: Medicare HMO

## 2022-03-07 DIAGNOSIS — E440 Moderate protein-calorie malnutrition: Secondary | ICD-10-CM | POA: Diagnosis not present

## 2022-03-07 DIAGNOSIS — R918 Other nonspecific abnormal finding of lung field: Secondary | ICD-10-CM | POA: Diagnosis not present

## 2022-03-07 DIAGNOSIS — J181 Lobar pneumonia, unspecified organism: Secondary | ICD-10-CM | POA: Diagnosis not present

## 2022-03-07 DIAGNOSIS — R509 Fever, unspecified: Secondary | ICD-10-CM | POA: Diagnosis not present

## 2022-03-07 DIAGNOSIS — Z87440 Personal history of urinary (tract) infections: Secondary | ICD-10-CM

## 2022-03-07 DIAGNOSIS — Z741 Need for assistance with personal care: Secondary | ICD-10-CM | POA: Diagnosis not present

## 2022-03-07 DIAGNOSIS — R627 Adult failure to thrive: Secondary | ICD-10-CM

## 2022-03-07 DIAGNOSIS — Z7901 Long term (current) use of anticoagulants: Secondary | ICD-10-CM

## 2022-03-07 DIAGNOSIS — I700 Atherosclerosis of aorta: Secondary | ICD-10-CM | POA: Diagnosis not present

## 2022-03-07 DIAGNOSIS — N390 Urinary tract infection, site not specified: Secondary | ICD-10-CM | POA: Diagnosis not present

## 2022-03-07 DIAGNOSIS — Z7401 Bed confinement status: Secondary | ICD-10-CM | POA: Diagnosis not present

## 2022-03-07 DIAGNOSIS — Z86711 Personal history of pulmonary embolism: Secondary | ICD-10-CM

## 2022-03-07 DIAGNOSIS — R278 Other lack of coordination: Secondary | ICD-10-CM | POA: Diagnosis not present

## 2022-03-07 DIAGNOSIS — Z833 Family history of diabetes mellitus: Secondary | ICD-10-CM | POA: Diagnosis not present

## 2022-03-07 DIAGNOSIS — I712 Thoracic aortic aneurysm, without rupture, unspecified: Secondary | ICD-10-CM | POA: Diagnosis present

## 2022-03-07 DIAGNOSIS — Z87891 Personal history of nicotine dependence: Secondary | ICD-10-CM | POA: Diagnosis not present

## 2022-03-07 DIAGNOSIS — E8809 Other disorders of plasma-protein metabolism, not elsewhere classified: Secondary | ICD-10-CM | POA: Diagnosis not present

## 2022-03-07 DIAGNOSIS — C679 Malignant neoplasm of bladder, unspecified: Secondary | ICD-10-CM | POA: Diagnosis not present

## 2022-03-07 DIAGNOSIS — Z8249 Family history of ischemic heart disease and other diseases of the circulatory system: Secondary | ICD-10-CM

## 2022-03-07 DIAGNOSIS — I714 Abdominal aortic aneurysm, without rupture, unspecified: Secondary | ICD-10-CM | POA: Diagnosis present

## 2022-03-07 DIAGNOSIS — M1990 Unspecified osteoarthritis, unspecified site: Secondary | ICD-10-CM | POA: Diagnosis present

## 2022-03-07 DIAGNOSIS — Z86718 Personal history of other venous thrombosis and embolism: Secondary | ICD-10-CM

## 2022-03-07 DIAGNOSIS — R001 Bradycardia, unspecified: Secondary | ICD-10-CM | POA: Diagnosis not present

## 2022-03-07 DIAGNOSIS — R410 Disorientation, unspecified: Secondary | ICD-10-CM | POA: Diagnosis not present

## 2022-03-07 DIAGNOSIS — J189 Pneumonia, unspecified organism: Secondary | ICD-10-CM | POA: Diagnosis not present

## 2022-03-07 DIAGNOSIS — E860 Dehydration: Secondary | ICD-10-CM

## 2022-03-07 DIAGNOSIS — M6259 Muscle wasting and atrophy, not elsewhere classified, multiple sites: Secondary | ICD-10-CM | POA: Diagnosis not present

## 2022-03-07 DIAGNOSIS — E43 Unspecified severe protein-calorie malnutrition: Secondary | ICD-10-CM | POA: Diagnosis present

## 2022-03-07 DIAGNOSIS — Z79899 Other long term (current) drug therapy: Secondary | ICD-10-CM | POA: Diagnosis not present

## 2022-03-07 DIAGNOSIS — E46 Unspecified protein-calorie malnutrition: Secondary | ICD-10-CM | POA: Diagnosis present

## 2022-03-07 DIAGNOSIS — R1312 Dysphagia, oropharyngeal phase: Secondary | ICD-10-CM | POA: Diagnosis not present

## 2022-03-07 DIAGNOSIS — R2681 Unsteadiness on feet: Secondary | ICD-10-CM | POA: Diagnosis not present

## 2022-03-07 DIAGNOSIS — A419 Sepsis, unspecified organism: Secondary | ICD-10-CM | POA: Diagnosis not present

## 2022-03-07 DIAGNOSIS — J439 Emphysema, unspecified: Secondary | ICD-10-CM | POA: Diagnosis present

## 2022-03-07 DIAGNOSIS — R531 Weakness: Secondary | ICD-10-CM | POA: Diagnosis not present

## 2022-03-07 DIAGNOSIS — C61 Malignant neoplasm of prostate: Secondary | ICD-10-CM | POA: Diagnosis not present

## 2022-03-07 DIAGNOSIS — R0902 Hypoxemia: Secondary | ICD-10-CM | POA: Diagnosis not present

## 2022-03-07 DIAGNOSIS — G9341 Metabolic encephalopathy: Secondary | ICD-10-CM | POA: Diagnosis not present

## 2022-03-07 DIAGNOSIS — Z6821 Body mass index (BMI) 21.0-21.9, adult: Secondary | ICD-10-CM | POA: Diagnosis not present

## 2022-03-07 DIAGNOSIS — M6281 Muscle weakness (generalized): Secondary | ICD-10-CM | POA: Diagnosis not present

## 2022-03-07 DIAGNOSIS — M2550 Pain in unspecified joint: Secondary | ICD-10-CM | POA: Diagnosis not present

## 2022-03-07 DIAGNOSIS — I10 Essential (primary) hypertension: Secondary | ICD-10-CM | POA: Diagnosis not present

## 2022-03-07 LAB — COMPREHENSIVE METABOLIC PANEL
ALT: 23 U/L (ref 0–44)
AST: 73 U/L — ABNORMAL HIGH (ref 15–41)
Albumin: 4 g/dL (ref 3.5–5.0)
Alkaline Phosphatase: 44 U/L (ref 38–126)
Anion gap: 9 (ref 5–15)
BUN: 27 mg/dL — ABNORMAL HIGH (ref 8–23)
CO2: 25 mmol/L (ref 22–32)
Calcium: 9.8 mg/dL (ref 8.9–10.3)
Chloride: 105 mmol/L (ref 98–111)
Creatinine, Ser: 1.15 mg/dL (ref 0.61–1.24)
GFR, Estimated: >60 mL/min (ref >60)
Glucose, Bld: 126 mg/dL — ABNORMAL HIGH (ref 70–99)
Potassium: 4.3 mmol/L (ref 3.5–5.1)
Sodium: 139 mmol/L (ref 135–145)
Total Bilirubin: 1.2 mg/dL (ref 0.3–1.2)
Total Protein: 8.6 g/dL — ABNORMAL HIGH (ref 6.5–8.1)

## 2022-03-07 LAB — CBC WITH DIFFERENTIAL/PLATELET
Abs Immature Granulocytes: 0.05 10*3/uL (ref 0.00–0.07)
Basophils Absolute: 0 10*3/uL (ref 0.0–0.1)
Basophils Relative: 0 %
Eosinophils Absolute: 0 10*3/uL (ref 0.0–0.5)
Eosinophils Relative: 0 %
HCT: 43.3 % (ref 39.0–52.0)
Hemoglobin: 13.9 g/dL (ref 13.0–17.0)
Immature Granulocytes: 1 %
Lymphocytes Relative: 10 %
Lymphs Abs: 0.8 10*3/uL (ref 0.7–4.0)
MCH: 28.4 pg (ref 26.0–34.0)
MCHC: 32.1 g/dL (ref 30.0–36.0)
MCV: 88.5 fL (ref 80.0–100.0)
Monocytes Absolute: 0.8 10*3/uL (ref 0.1–1.0)
Monocytes Relative: 9 %
Neutro Abs: 7.1 10*3/uL (ref 1.7–7.7)
Neutrophils Relative %: 80 %
Platelets: 171 10*3/uL (ref 150–400)
RBC: 4.89 MIL/uL (ref 4.22–5.81)
RDW: 12.8 % (ref 11.5–15.5)
WBC: 8.7 10*3/uL (ref 4.0–10.5)
nRBC: 0 % (ref 0.0–0.2)

## 2022-03-07 LAB — URINALYSIS, ROUTINE W REFLEX MICROSCOPIC
Bacteria, UA: NONE SEEN
Bilirubin Urine: NEGATIVE
Glucose, UA: NEGATIVE mg/dL
Ketones, ur: 20 mg/dL — AB
Leukocytes,Ua: NEGATIVE
Nitrite: NEGATIVE
Protein, ur: 100 mg/dL — AB
Specific Gravity, Urine: 1.023 (ref 1.005–1.030)
pH: 5 (ref 5.0–8.0)

## 2022-03-07 LAB — APTT: aPTT: 35 seconds (ref 24–36)

## 2022-03-07 LAB — PHOSPHORUS: Phosphorus: 1.7 mg/dL — ABNORMAL LOW (ref 2.5–4.6)

## 2022-03-07 LAB — PROTIME-INR
INR: 2 — ABNORMAL HIGH (ref 0.8–1.2)
Prothrombin Time: 22.8 seconds — ABNORMAL HIGH (ref 11.4–15.2)

## 2022-03-07 LAB — LACTIC ACID, PLASMA: Lactic Acid, Venous: 1.6 mmol/L (ref 0.5–1.9)

## 2022-03-07 MED ORDER — ADULT MULTIVITAMIN W/MINERALS CH
1.0000 | ORAL_TABLET | Freq: Every day | ORAL | Status: DC
  Administered 2022-03-08 – 2022-03-11 (×4): 1 via ORAL
  Filled 2022-03-07 (×4): qty 1

## 2022-03-07 MED ORDER — SODIUM CHLORIDE 0.9 % IV SOLN
2.0000 g | Freq: Once | INTRAVENOUS | Status: AC
  Administered 2022-03-07: 2 g via INTRAVENOUS
  Filled 2022-03-07: qty 20

## 2022-03-07 MED ORDER — LATANOPROST 0.005 % OP SOLN
1.0000 [drp] | Freq: Every day | OPHTHALMIC | Status: DC
Start: 2022-03-07 — End: 2022-03-11
  Administered 2022-03-07 – 2022-03-10 (×4): 1 [drp] via OPHTHALMIC
  Filled 2022-03-07: qty 2.5

## 2022-03-07 MED ORDER — SODIUM CHLORIDE 0.9 % IV SOLN
500.0000 mg | Freq: Once | INTRAVENOUS | Status: AC
Start: 2022-03-07 — End: 2022-03-07
  Administered 2022-03-07: 500 mg via INTRAVENOUS
  Filled 2022-03-07: qty 5

## 2022-03-07 MED ORDER — RIVAROXABAN 20 MG PO TABS
20.0000 mg | ORAL_TABLET | Freq: Every day | ORAL | Status: DC
Start: 2022-03-07 — End: 2022-03-11
  Administered 2022-03-07 – 2022-03-10 (×4): 20 mg via ORAL
  Filled 2022-03-07 (×4): qty 1

## 2022-03-07 MED ORDER — SODIUM CHLORIDE 0.9 % IV SOLN
INTRAVENOUS | Status: DC
Start: 2022-03-07 — End: 2022-03-09

## 2022-03-07 MED ORDER — LACTATED RINGERS IV BOLUS (SEPSIS)
1000.0000 mL | Freq: Once | INTRAVENOUS | Status: AC
  Administered 2022-03-07: 1000 mL via INTRAVENOUS

## 2022-03-07 MED ORDER — AZITHROMYCIN 250 MG PO TABS
500.0000 mg | ORAL_TABLET | Freq: Every day | ORAL | Status: DC
  Administered 2022-03-08 – 2022-03-11 (×4): 500 mg via ORAL
  Filled 2022-03-07 (×4): qty 2

## 2022-03-07 MED ORDER — ACETAMINOPHEN 325 MG PO TABS
650.0000 mg | ORAL_TABLET | Freq: Four times a day (QID) | ORAL | Status: DC | PRN
  Administered 2022-03-09: 650 mg via ORAL
  Filled 2022-03-07: qty 2

## 2022-03-07 MED ORDER — ACETAMINOPHEN 650 MG RE SUPP: 650.0000 mg | Freq: Four times a day (QID) | RECTAL | Status: DC | PRN

## 2022-03-07 MED ORDER — SODIUM CHLORIDE 0.9 % IV SOLN
2.0000 g | INTRAVENOUS | Status: DC
  Administered 2022-03-08 – 2022-03-11 (×4): 2 g via INTRAVENOUS
  Filled 2022-03-07 (×4): qty 20

## 2022-03-07 NOTE — ED Notes (Signed)
Pt tolerated food and drink well without nausea

## 2022-03-07 NOTE — ED Notes (Signed)
Pt unable to ambulate due to continued weakness

## 2022-03-07 NOTE — ED Provider Notes (Signed)
[facility] DEPT Provider Note   CSN: [id number] Arrival date & time: 03/07/22  0758     History  Chief Complaint  Patient presents with   Weakness   Altered Mental Status    Connor Morris is a 88 y.o. male.  HPI Patient presents for evaluation of altered mental status, weakness, decreased appetite and sleepiness.  Onset of symptoms yesterday they were progressive today so family members called EMS.  He has previously been like this when he had a UTI according to his daughter who is at the bedside.  Patient cannot give any history.    Home Medications Prior to Admission medications   Medication Sig Start Date End Date Taking? Authorizing Provider  Multiple Vitamins-Minerals (MULTIVITAMIN WITH MINERALS) tablet Take 1 tablet by mouth daily.     [provider]  rivaroxaban (XARELTO) 20 MG TABS tablet Take 20 mg by mouth daily with supper.    [provider]      Allergies    Patient has no known allergies.    Review of Systems   Review of Systems  Physical Exam Updated Vital Signs BP (!) 167/75   Pulse 73   Temp 97.6 F (36.4 C) (Oral)   Resp (!) 21   SpO2 97%  Physical Exam Vitals and nursing note reviewed.  Constitutional:      Appearance: He is well-developed. He is not ill-appearing.     Comments: He is elderly and appears under nourished.  HENT:     Head: Normocephalic and atraumatic.     Right Ear: External ear normal.     Left Ear: External ear normal.     Nose: No congestion.     Mouth/Throat:     Mouth: Mucous membranes are dry.     Comments: Mucous membranes are dry. Eyes:     Conjunctiva/sclera: Conjunctivae normal.     Pupils: Pupils are equal, round, and reactive to light.  Neck:     Trachea: Phonation normal.  Cardiovascular:     Rate and Rhythm: Normal rate and regular rhythm.     Heart sounds: Normal heart sounds.  Pulmonary:     Effort: Pulmonary effort is normal.     Breath sounds:  Normal breath sounds.  Abdominal:     General: There is no distension.     Palpations: Abdomen is soft.     Tenderness: There is no abdominal tenderness.  Musculoskeletal:        General: No swelling or tenderness. Normal range of motion.     Cervical back: Normal range of motion and neck supple.  Skin:    General: Skin is warm and dry.     Coloration: Skin is not jaundiced or pale.  Neurological:     Mental Status: He is alert.     Cranial Nerves: No cranial nerve deficit.     Sensory: No sensory deficit.     Motor: No abnormal muscle tone.     Coordination: Coordination normal.     Comments: No dysarthria or aphasia.  He is responsive but confused.  Psychiatric:        Mood and Affect: Mood normal.        Behavior: Behavior normal.     ED Results / Procedures / Treatments   Labs (all labs ordered are listed, but only abnormal results are displayed) Labs Reviewed  URINALYSIS, ROUTINE W REFLEX MICROSCOPIC - Abnormal; Notable for the following components:      Result Value  Hgb urine dipstick LARGE (*)    Ketones, ur 20 (*)    Protein, ur 100 (*)    All other components within normal limits  COMPREHENSIVE METABOLIC PANEL - Abnormal; Notable for the following components:   Glucose, Bld 126 (*)    BUN 27 (*)    Total Protein 8.6 (*)    AST 73 (*)    All other components within normal limits  PROTIME-INR - Abnormal; Notable for the following components:   Prothrombin Time 22.8 (*)    INR 2.0 (*)    All other components within normal limits  URINE CULTURE  CULTURE, BLOOD (ROUTINE X 2)  CULTURE, BLOOD (ROUTINE X 2)  LACTIC ACID, PLASMA  CBC WITH DIFFERENTIAL/PLATELET  APTT  LACTIC ACID, PLASMA    EKG EKG Interpretation  Date/Time:  Sunday March 07 2022 10:41:54 EDT Ventricular Rate:  59 PR Interval:  253 QRS Duration: 96 QT Interval:  465 QTC Calculation: 461 R Axis:   -34 Text Interpretation: Sinus rhythm Prolonged PR interval Left axis deviation Borderline T  abnormalities, anterior leads Since last tracing PR interval is longer Otherwise no significant change Confirmed by Daleen Bo [phone]) on 03/07/2022 2:57:54 PM  Radiology DG Chest Port 1 View  Result Date: 03/07/2022 CLINICAL DATA:  Questionable sepsis.  Evaluate for abnormality. EXAM: PORTABLE CHEST 1 VIEW COMPARISON:  01/30/2020 FINDINGS: Aortic atherosclerotic calcifications. Stable cardiomediastinal contours with aortic tortuosity. Lung volumes are low. New hazy opacity is identified within the right lower. Left lung appears clear. No signs of pleural effusion or edema. IMPRESSION: New hazy opacity within the right lower lobe which may early pneumonia. Electronically Signed   By: Kerby Moors M.D.   On: 03/07/2022 10:43    Procedures Procedures    Medications Ordered in ED Medications  azithromycin (ZITHROMAX) 500 mg in sodium chloride 0.9 % 250 mL IVPB (has no administration in time range)  lactated ringers bolus 1,000 mL (0 mLs Intravenous Stopped 03/07/22 1153)  cefTRIAXone (ROCEPHIN) 2 g in sodium chloride 0.9 % 100 mL IVPB (0 g Intravenous Stopped 03/07/22 1153)    ED Course/ Medical Decision Making/ A&P Clinical Course as of 03/07/22 1629  Sun Mar 07, 2022  1440 At this time the patient is comfortable.  He is able to communicate.  His daughter with him states that he can sometimes walk independently but sometimes uses a cane. [EW]    Clinical Course User Index [EW] Daleen Bo, MD                           Medical Decision Making Patient presenting with concern for sepsis, screening labs ordered, empiric Rocephin given for suspected UTI.  IV bolus of lactated Ringer's given.  Amount and/or Complexity of Data Reviewed Independent Historian:     Details: Daughter at bedside gives history.  Patient lives with a son.  That son is not currently here.  She states that he saw his urologist recently for a shot to "lower his testosterone."  He has not had a urine infection  recently but has had them in the past.  He has not seen his PCP for about 8 months. Labs: ordered.    Details: Lactate, metabolic panel, CBC, urinalysis -- normal except increased white and red cells on urinalysis, urine culture ordered Radiology: ordered.    Details: Chest x-ray -- subtle right lower lobe pneumonia, no edema ECG/medicine tests: ordered.    Details: Cardiac monitor -- normal  sinus rhythm Discussion of management or test interpretation with external provider(s): Case discussed with hospitalist who agrees to see patient for admission.  Risk Risk Details: Patient presenting for suspected UTI with daughter.  He has been less active and able to walk over the last day and a half.  No falls have occurred.  Patient is chronically debilitated, followed by urology for urinary bladder cancer.  Patient treated with IV fluids and Rocephin, after which he was able to eat but not able to walk.  He is extremely debilitated.  Critical Care Total time providing critical care: 35 minutes           Final Clinical Impression(s) / ED Diagnoses Final diagnoses:  Urinary tract infection without hematuria, site unspecified  Weakness    Rx / DC Orders ED Discharge Orders     None         Daleen Bo, MD 03/07/22 1629

## 2022-03-07 NOTE — ED Notes (Signed)
Pt given sandwich and beverage, will attempt to ambulate shortly.

## 2022-03-07 NOTE — ED Triage Notes (Signed)
EMS reports from home, per familyweakness noticed generalized weakness and AMS since yesterday. Pt normally ambulatory, was unable to bare weight today. Odorous urine.  BP 170/92 HR 80 RR 22 Sp02 98 RA CBG 172  20LAC

## 2022-03-07 NOTE — H&P (Signed)
History and Physical    Patient: Connor Morris UXL:[id number] DOB: 1934/05/12 DOA: 03/07/2022 DOS: the patient was seen and examined on 03/07/2022 PCP: Velna Hatchet, MD  Patient coming from: Home  Chief Complaint:  Chief Complaint  Patient presents with   Weakness   Altered Mental Status   HPI: Connor Morris is a 88 y.o. male with medical history significant of bladder cancer, prostate cancer, DVT/PE on xarelto, thoracic aortic aneurysm. Presenting with weakness. History is from his family at bedside. He was in his normal state of health until about 2 days ago. His daughter noticed he seemed slower and weaker. He seemed like he was slightly confused. She noticed that he had a little cough, but moreso, he was blowing his nose more frequently. That evening, he was trying to bathe himself, but he was having difficulty standing. He was weak and needed to sit down; but he couldn't lower himself to the toilet on his own. So his family helped him, and eventually got him to bed. He was having difficulty dressing himself and moving throughout the house yesterday. This morning, he couldn't walk. Since his symptoms seemed to be worsening, his family brought him to the ED for evaluation. He has had poor PO intake. He didn't have any fever, N/V/D, or sick contacts. They deny any other aggravating or alleviating factors.     Review of Systems: As mentioned in the history of present illness. All other systems reviewed and are negative. Past Medical History:  Diagnosis Date   1st degree AV block    AAA (abdominal aortic aneurysm) ([place]) 03/2018   followed by dr Cyndia Bent--- last CTA  4.6cm   Anticoagulated 03/2018   xarelto for hx pe/ dvt--- managed by pcp   Arthritis    Emphysema lung ([place])    Hiatal hernia    History of bladder cancer    urologist--- dr Milford Cage   s/p TURBT feb and march 2021   History of DVT of lower extremity 03/2018   LLE   History of prostate cancer 2002   s/p radioactive seed  implants 04-11-2001   History of pulmonary embolus (PE) 03/2018   left side--  on xarelto   History of sepsis 01/30/2020   hospital admission in epic;  due to UTI   Impaired memory    Retained ureteral stent    right side   Sinus bradycardia 2018   cardiologist--- dr Cathie Olden   Urgency of urination    Past Surgical History:  Procedure Laterality Date   CATARACT EXTRACTION W/ INTRAOCULAR LENS IMPLANT Right 2020   CYSTOSCOPY N/A 10/30/2019   Procedure: CYSTOSCOPY;  Surgeon: Cleon Gustin, MD;  Location[facility];  Service: Urology;  Laterality: N/A;   CYSTOSCOPY W/ RETROGRADES Bilateral 09/20/2019   Procedure: CYSTOSCOPY WITH RETROGRADE PYELOGRAM;  Surgeon: Cleon Gustin, MD;  Location: [facility];  Service: Urology;  Laterality: Bilateral;   CYSTOSCOPY W/ URETERAL STENT REMOVAL N/A 03/25/2020   Procedure: CYSTOSCOPY WITH  REMOVAL OF RETAINED FOLEY CATHETER,  FULGERATION;  Surgeon: Remi Haggard, MD;  Location[facility];  Service: Urology;  Laterality: N/A;   FOOT SURGERY  yrs ago   RADIOACTIVE PROSTATE SEED IMPLANTS  04-11-2001  dr Janice Norrie  '[email or web address]'$    TRANSURETHRAL RESECTION OF BLADDER TUMOR N/A 09/20/2019   Procedure: TRANSURETHRAL RESECTION OF BLADDER TUMOR (TURBT);  Surgeon: Cleon Gustin, MD;  Location: [facility];  Service: Urology;  Laterality: N/A;  1 HR  TRANSURETHRAL RESECTION OF BLADDER TUMOR N/A 10/30/2019   Procedure: TRANSURETHRAL RESECTION OF BLADDER TUMOR (TURBT), REMOVAL OF RIGHT STENT;  Surgeon: Cleon Gustin, MD;  Location: [facility];  Service: Urology;  Laterality: N/A;   Social History:  reports that he quit smoking about 36 years ago. His smoking use included cigarettes. He has a 20.00 pack-year smoking history. He has never used smokeless tobacco. He reports that he does not drink alcohol and does not use drugs.  No Known Allergies  Family History  Problem Relation  Age of Onset   Diabetes Sister    Hypertension Mother        family history   Healthy Father    Colon cancer Neg Hx     Prior to Admission medications   Medication Sig Start Date End Date Taking? Authorizing Provider  Multiple Vitamins-Minerals (MULTIVITAMIN WITH MINERALS) tablet Take 1 tablet by mouth daily.     [provider]  rivaroxaban (XARELTO) 20 MG TABS tablet Take 20 mg by mouth daily with supper.    [provider]    Physical Exam: Vitals:   03/07/22 1430 03/07/22 1448 03/07/22 1500 03/07/22 1530  BP: (!) 183/67  (!) 168/80 (!) 167/75  Pulse: (!) 52  67 73  Resp: 12  (!) 21 (!) 21  Temp:  97.6 F (36.4 C)    TempSrc:  Oral    SpO2: 97%  95% 97%   General: 88 y.o. male resting in bed in NAD Eyes: PERRL, normal sclera ENMT: Nares patent w/o discharge, orophaynx clear, dentition poor, ears w/o discharge/lesions/ulcers Neck: Supple, trachea midline Cardiovascular: RRR, +S1, S2, no m/g/r, equal pulses throughout Respiratory: decreased at bases, no w/r/r, normal WOB GI: BS+, NDNT, no masses noted, no organomegaly noted MSK: No e/c/c Neuro: A&O x 2 (name/place), no focal deficits Psyc: Somewhat confused, calm/cooperative  Data Reviewed:  Lab Results  Component Value Date   NA 139 03/07/2022   K 4.3 03/07/2022   CO2 25 03/07/2022   GLUCOSE 126 (H) 03/07/2022   BUN 27 (H) 03/07/2022   CREATININE 1.15 03/07/2022   CALCIUM 9.8 03/07/2022   GFRNONAA >60 03/07/2022   Lab Results  Component Value Date   WBC 8.7 03/07/2022   HGB 13.9 03/07/2022   HCT 43.3 03/07/2022   MCV 88.5 03/07/2022   PLT 171 03/07/2022   CXR New hazy opacity within the right lower lobe which may early pneumonia.  Assessment and Plan: RLL PNA     - place in obs, tele     - started on rocephin/azithro; continue     - PRN guaifenesin  Generalized weakness     - likely multifactorial     - get him some fluids, encourage diet     - PT/OT  Dehydration FTT     -  fluids, monitor  Severe protein-calorie malnutrition     - dietitian consult     - encourage diet  Elevated LFTs     - mild, trend  Advance Care Planning:   Code Status: DNI  Consults: None  Family Communication: w/ daughter at bedside  Severity of Illness: The appropriate patient status for this patient is OBSERVATION. Observation status is judged to be reasonable and necessary in order to provide the required intensity of service to ensure the patient's safety. The patient's presenting symptoms, physical exam findings, and initial radiographic and laboratory data in the context of their medical condition is felt to place them at decreased risk for  further clinical deterioration. Furthermore, it is anticipated that the patient will be medically stable for discharge from the hospital within 2 midnights of admission.   Author: Jonnie Finner, DO 03/07/2022 4:31 PM  For on call review [email or web address].

## 2022-03-08 DIAGNOSIS — J189 Pneumonia, unspecified organism: Secondary | ICD-10-CM | POA: Diagnosis present

## 2022-03-08 DIAGNOSIS — R627 Adult failure to thrive: Secondary | ICD-10-CM | POA: Diagnosis present

## 2022-03-08 DIAGNOSIS — Z86718 Personal history of other venous thrombosis and embolism: Secondary | ICD-10-CM | POA: Diagnosis not present

## 2022-03-08 DIAGNOSIS — M2550 Pain in unspecified joint: Secondary | ICD-10-CM | POA: Diagnosis not present

## 2022-03-08 DIAGNOSIS — Z6821 Body mass index (BMI) 21.0-21.9, adult: Secondary | ICD-10-CM | POA: Diagnosis not present

## 2022-03-08 DIAGNOSIS — G9341 Metabolic encephalopathy: Secondary | ICD-10-CM | POA: Diagnosis present

## 2022-03-08 DIAGNOSIS — M1990 Unspecified osteoarthritis, unspecified site: Secondary | ICD-10-CM | POA: Diagnosis present

## 2022-03-08 DIAGNOSIS — Z79899 Other long term (current) drug therapy: Secondary | ICD-10-CM | POA: Diagnosis not present

## 2022-03-08 DIAGNOSIS — E43 Unspecified severe protein-calorie malnutrition: Secondary | ICD-10-CM | POA: Diagnosis not present

## 2022-03-08 DIAGNOSIS — Z7901 Long term (current) use of anticoagulants: Secondary | ICD-10-CM | POA: Diagnosis not present

## 2022-03-08 DIAGNOSIS — C61 Malignant neoplasm of prostate: Secondary | ICD-10-CM | POA: Diagnosis present

## 2022-03-08 DIAGNOSIS — I712 Thoracic aortic aneurysm, without rupture, unspecified: Secondary | ICD-10-CM | POA: Diagnosis present

## 2022-03-08 DIAGNOSIS — Z741 Need for assistance with personal care: Secondary | ICD-10-CM | POA: Diagnosis not present

## 2022-03-08 DIAGNOSIS — R1312 Dysphagia, oropharyngeal phase: Secondary | ICD-10-CM | POA: Diagnosis not present

## 2022-03-08 DIAGNOSIS — J181 Lobar pneumonia, unspecified organism: Secondary | ICD-10-CM | POA: Diagnosis present

## 2022-03-08 DIAGNOSIS — E440 Moderate protein-calorie malnutrition: Secondary | ICD-10-CM | POA: Insufficient documentation

## 2022-03-08 DIAGNOSIS — E8809 Other disorders of plasma-protein metabolism, not elsewhere classified: Secondary | ICD-10-CM | POA: Diagnosis present

## 2022-03-08 DIAGNOSIS — M6259 Muscle wasting and atrophy, not elsewhere classified, multiple sites: Secondary | ICD-10-CM | POA: Diagnosis not present

## 2022-03-08 DIAGNOSIS — Z87891 Personal history of nicotine dependence: Secondary | ICD-10-CM | POA: Diagnosis not present

## 2022-03-08 DIAGNOSIS — R278 Other lack of coordination: Secondary | ICD-10-CM | POA: Diagnosis not present

## 2022-03-08 DIAGNOSIS — E860 Dehydration: Secondary | ICD-10-CM | POA: Diagnosis present

## 2022-03-08 DIAGNOSIS — Z87440 Personal history of urinary (tract) infections: Secondary | ICD-10-CM | POA: Diagnosis not present

## 2022-03-08 DIAGNOSIS — M6281 Muscle weakness (generalized): Secondary | ICD-10-CM | POA: Diagnosis not present

## 2022-03-08 DIAGNOSIS — J439 Emphysema, unspecified: Secondary | ICD-10-CM | POA: Diagnosis present

## 2022-03-08 DIAGNOSIS — Z86711 Personal history of pulmonary embolism: Secondary | ICD-10-CM | POA: Diagnosis not present

## 2022-03-08 DIAGNOSIS — Z833 Family history of diabetes mellitus: Secondary | ICD-10-CM | POA: Diagnosis not present

## 2022-03-08 DIAGNOSIS — Z8249 Family history of ischemic heart disease and other diseases of the circulatory system: Secondary | ICD-10-CM | POA: Diagnosis not present

## 2022-03-08 DIAGNOSIS — I714 Abdominal aortic aneurysm, without rupture, unspecified: Secondary | ICD-10-CM | POA: Diagnosis present

## 2022-03-08 DIAGNOSIS — R531 Weakness: Secondary | ICD-10-CM | POA: Diagnosis present

## 2022-03-08 DIAGNOSIS — C679 Malignant neoplasm of bladder, unspecified: Secondary | ICD-10-CM | POA: Diagnosis present

## 2022-03-08 DIAGNOSIS — Z7401 Bed confinement status: Secondary | ICD-10-CM | POA: Diagnosis not present

## 2022-03-08 DIAGNOSIS — R2681 Unsteadiness on feet: Secondary | ICD-10-CM | POA: Diagnosis not present

## 2022-03-08 LAB — COMPREHENSIVE METABOLIC PANEL
ALT: 34 U/L (ref 0–44)
AST: 141 U/L — ABNORMAL HIGH (ref 15–41)
Albumin: 3.2 g/dL — ABNORMAL LOW (ref 3.5–5.0)
Alkaline Phosphatase: 37 U/L — ABNORMAL LOW (ref 38–126)
Anion gap: 8 (ref 5–15)
BUN: 23 mg/dL (ref 8–23)
CO2: 23 mmol/L (ref 22–32)
Calcium: 9 mg/dL (ref 8.9–10.3)
Chloride: 108 mmol/L (ref 98–111)
Creatinine, Ser: 1.23 mg/dL (ref 0.61–1.24)
GFR, Estimated: 57 mL/min — ABNORMAL LOW (ref >60)
Glucose, Bld: 94 mg/dL (ref 70–99)
Potassium: 4.1 mmol/L (ref 3.5–5.1)
Sodium: 139 mmol/L (ref 135–145)
Total Bilirubin: 1.1 mg/dL (ref 0.3–1.2)
Total Protein: 7.1 g/dL (ref 6.5–8.1)

## 2022-03-08 LAB — LACTIC ACID, PLASMA: Lactic Acid, Venous: 1.1 mmol/L (ref 0.5–1.9)

## 2022-03-08 LAB — CBC
HCT: 42.1 % (ref 39.0–52.0)
Hemoglobin: 12.9 g/dL — ABNORMAL LOW (ref 13.0–17.0)
MCH: 27.8 pg (ref 26.0–34.0)
MCHC: 30.6 g/dL (ref 30.0–36.0)
MCV: 90.7 fL (ref 80.0–100.0)
Platelets: 164 10*3/uL (ref 150–400)
RBC: 4.64 MIL/uL (ref 4.22–5.81)
RDW: 12.9 % (ref 11.5–15.5)
WBC: 9.9 10*3/uL (ref 4.0–10.5)
nRBC: 0 % (ref 0.0–0.2)

## 2022-03-08 LAB — URINE CULTURE

## 2022-03-08 LAB — STREP PNEUMONIAE URINARY ANTIGEN: Strep Pneumo Urinary Antigen: NEGATIVE

## 2022-03-08 MED ORDER — ENSURE ENLIVE PO LIQD
237.0000 mL | Freq: Two times a day (BID) | ORAL | Status: DC
  Administered 2022-03-08 – 2022-03-11 (×6): 237 mL via ORAL

## 2022-03-08 NOTE — Evaluation (Addendum)
Physical Therapy Evaluation Patient Details Name: Connor Morris MRN: [id number] DOB: 01-Jun-1934 Today's Date: 03/08/2022  History of Present Illness  88 y.o. male with medical history significant of bladder cancer, prostate cancer, DVT/PE on xarelto, thoracic aortic aneurysm. Presenting with weakness.  This is been worse over the last couple of days, he seems slower and weaker per family, and also slightly confused.  Family also noticed a cough and a runny nose.  He was found to have lobar pneumonia  Clinical Impression  Pt admitted with above diagnosis. Pt is pleasantly confused, he is able to follow commands, is oriented to self but not to location nor situation. Mod assist for supine to sit, mod assist sit to stand, min assist to take several pivotal steps to the recliner with RW, distance limited by L groin and thigh pain. RN notified of pt's request for pain medication. Due to confusion, pt will need 24/7 assistance for safety. Pt was noted to cough after having a sip of water, may benefit from swallow evaluation, RN notified. Pt currently with functional limitations due to the deficits listed below (see PT Problem List). Pt will benefit from skilled PT to increase their independence and safety with mobility to allow discharge to the venue listed below.          Recommendations for follow up therapy are one component of a multi-disciplinary discharge planning process, led by the attending physician.  Recommendations may be updated based on patient status, additional functional criteria and insurance authorization.  Follow Up Recommendations Skilled nursing-short term rehab (<3 hours/day) if family is unable to provide 24* assistance Can patient physically be transported by private vehicle: Yes    Assistance Recommended at Discharge Intermittent Supervision/Assistance  Patient can return home with the following  A little help with bathing/dressing/bathroom;A little help with walking and/or  transfers;Assistance with cooking/housework;Direct supervision/assist for medications management;Help with stairs or ramp for entrance;Assist for transportation;Direct supervision/assist for financial management    Equipment Recommendations None recommended by PT  Recommendations for Other Services       Functional Status Assessment Patient has had a recent decline in their functional status and demonstrates the ability to make significant improvements in function in a reasonable and predictable amount of time.     Precautions / Restrictions Precautions Precautions: None Restrictions Weight Bearing Restrictions: No      Mobility  Bed Mobility Overal bed mobility: Needs Assistance Bed Mobility: Supine to Sit     Supine to sit: Mod assist     General bed mobility comments: assist to raise trunk and pivot hips to EOB    Transfers Overall transfer level: Needs assistance Equipment used: Rolling walker (2 wheels) Transfers: Sit to/from Stand, Bed to chair/wheelchair/BSC Sit to Stand: From elevated surface, Mod assist   Step pivot transfers: Min assist       General transfer comment: increased time and effort, pain L groin/thigh with weight bearing    Ambulation/Gait Ambulation/Gait assistance: Min assist Gait Distance (Feet): 3 Feet Assistive device: Rolling walker (2 wheels) Gait Pattern/deviations: Step-to pattern, Decreased stride length       General Gait Details: pt took a few pivotal steps from bed to recliner with RW, distance limited by L groin/thigh pain  Stairs            Wheelchair Mobility    Modified Rankin (Stroke Patients Only)       Balance Overall balance assessment: Needs assistance Sitting-balance support: Feet supported, No upper extremity supported Sitting balance-Leahy Scale: Good  Standing balance support: Reliant on assistive device for balance, Bilateral upper extremity supported Standing balance-Leahy Scale: Fair                                Pertinent Vitals/Pain Pain Assessment Pain Assessment: Faces Faces Pain Scale: Hurts even more Pain Location: L groin/thigh with walking Pain Descriptors / Indicators: Grimacing, Guarding Pain Intervention(s): Limited activity within patient's tolerance, Monitored during session, Patient requesting pain meds-RN notified, Repositioned    Home Living Family/patient expects to be discharged to:: Private residence Living Arrangements: Alone Available Help at Discharge: Family;Available PRN/intermittently Type of Home: House           Home Equipment: Conservation officer, nature (2 wheels);Cane - single point Additional Comments: pt stated he uses a cane or a walker when ambulating, that his son stops by to help as needed. Pt not oriented to location at present, so unclear if info he provided is accurate.    Prior Function Prior Level of Function : Patient poor historian/Family not available             Mobility Comments: walks with either a cane or walker ADLs Comments: reports son assists with bathing as needed     Hand Dominance        Extremity/Trunk Assessment   Upper Extremity Assessment Upper Extremity Assessment: Defer to OT evaluation    Lower Extremity Assessment Lower Extremity Assessment: Overall WFL for tasks assessed    Cervical / Trunk Assessment Cervical / Trunk Assessment: Kyphotic  Communication   Communication: No difficulties  Cognition Arousal/Alertness: Awake/alert Behavior During Therapy: WFL for tasks assessed/performed Overall Cognitive Status: Impaired/Different from baseline Area of Impairment: Orientation                 Orientation Level: Situation, Place             General Comments: pt stated that he is at home        General Comments      Exercises     Assessment/Plan    PT Assessment Patient needs continued PT services  PT Problem List Decreased mobility;Decreased activity  tolerance;Decreased balance;Pain       PT Treatment Interventions Gait training;Therapeutic exercise;Patient/family education;Functional mobility training;Therapeutic activities    PT Goals (Current goals can be found in the Care Plan section)  Acute Rehab PT Goals PT Goal Formulation: Patient unable to participate in goal setting Time For Goal Achievement: 03/22/22 Potential to Achieve Goals: Good    Frequency Min 3X/week     Co-evaluation               AM-PAC PT "6 Clicks" Mobility  Outcome Measure Help needed turning from your back to your side while in a flat bed without using bedrails?: A Little Help needed moving from lying on your back to sitting on the side of a flat bed without using bedrails?: A Lot Help needed moving to and from a bed to a chair (including a wheelchair)?: A Little Help needed standing up from a chair using your arms (e.g., wheelchair or bedside chair)?: A Lot Help needed to walk in hospital room?: A Little Help needed climbing 3-5 steps with a railing? : A Lot 6 Click Score: 15    End of Session Equipment Utilized During Treatment: Gait belt Activity Tolerance: Patient limited by fatigue;Patient limited by pain Patient left: in chair;with chair alarm set;with call bell/phone within reach Nurse Communication: Mobility  status PT Visit Diagnosis: Difficulty in walking, not elsewhere classified (R26.2);Pain Pain - Right/Left: Left Pain - part of body: Hip    Time: 1209-1228 PT Time Calculation (min) (ACUTE ONLY): 19 min   Charges:   PT Evaluation $PT Eval Moderate Complexity: 1 Mod         Philomena Doheny PT 03/08/2022  Acute Rehabilitation Services  Office [phone]

## 2022-03-08 NOTE — TOC Initial Note (Signed)
Transition of Care [facility]) - Initial/Assessment Note    Patient Details  Name: Connor Morris MRN: [id number] Date of Birth: Aug 13, 1934  Transition of Care [facility]) CM/SW Contact:    Vassie Moselle, LCSW Phone Number: 03/08/2022, 2:57 PM  Clinical Narrative:                 Met with pt and confirmed plans for SNF placement. Pt states he has never been to SNF and does not currently have a preference. CSW spoke with pt's daughter, Maddex Garlitz who is agreeable to SNF placement for this pt. CSW has faxed referrals for SNF and currently awaiting bed offers.   Expected Discharge Plan: Skilled Nursing Facility Barriers to Discharge: Continued Medical Work up   Patient Goals and CMS Choice Patient states their goals for this hospitalization and ongoing recovery are:: To go home   Choice offered to / list presented to : Patient, Adult Children  Expected Discharge Plan and Services Expected Discharge Plan: [place] In-house Referral: Clinical Social Work Discharge Planning Services: CM Consult Post Acute Care Choice: [place] Living arrangements for the past 2 months: Single Family Home                 DME Arranged: N/A DME Agency: NA                  Prior Living Arrangements/Services Living arrangements for the past 2 months: Single Family Home Lives with:: Self Patient language and need for interpreter reviewed:: Yes Do you feel safe going back to the place where you live?: Yes      Need for Family Participation in Patient Care: Yes (Comment) Care giver support system in place?: No (comment) Current home services: DME Criminal Activity/Legal Involvement Pertinent to Current Situation/Hospitalization: No - Comment as needed  Activities of Daily Living   ADL Screening (condition at time of admission) Patient's cognitive ability adequate to safely complete daily activities?: No Is the patient deaf or have difficulty hearing?: No Does the  patient have difficulty seeing, even when wearing glasses/contacts?: No Does the patient have difficulty concentrating, remembering, or making decisions?: Yes Patient able to express need for assistance with ADLs?: No Does the patient have difficulty dressing or bathing?: Yes Independently performs ADLs?: No Communication: Independent Dressing (OT): Needs assistance Is this a change from baseline?: Change from baseline, expected to last <3days Grooming: Needs assistance Is this a change from baseline?: Change from baseline, expected to last <3 days Feeding: Needs assistance Is this a change from baseline?: Change from baseline, expected to last <3 days Bathing: Needs assistance Is this a change from baseline?: Change from baseline, expected to last <3 days Toileting: Needs assistance Is this a change from baseline?: Change from baseline, expected to last <3 days In/Out Bed: Needs assistance Is this a change from baseline?: Change from baseline, expected to last <3 days Walks in Home: Needs assistance Is this a change from baseline?: Change from baseline, expected to last <3 days Does the patient have difficulty walking or climbing stairs?: Yes Weakness of Legs: Both Weakness of Arms/Hands: None  Permission Sought/Granted Permission sought to share information with : Facility Sport and exercise psychologist, Family Supports Permission granted to share information with : Yes, Verbal Permission Granted  Share Information with NAME: Awad Gladd     Permission granted to share info w Relationship: Daughter  Permission granted to share info w Contact Information: [phone]  Emotional Assessment Appearance:: Appears stated age Attitude/Demeanor/Rapport: Engaged Affect (typically  observed): Accepting Orientation: : Oriented to Self Alcohol / Substance Use: Not Applicable Psych Involvement: No (comment)  Admission diagnosis:  Weakness [R53.1] PNA (pneumonia) [J18.9] Urinary tract  infection without hematuria, site unspecified [N39.0] CAP (community acquired pneumonia) [J18.9] Patient Active Problem List   Diagnosis Date Noted   Malnutrition of moderate degree 03/08/2022   CAP (community acquired pneumonia) 03/08/2022   PNA (pneumonia) 03/07/2022   Dehydration 03/07/2022   Generalized weakness 03/07/2022   Failure to thrive in adult 03/07/2022   Hematuria 03/25/2020   Severe protein-calorie malnutrition ([place]) 01/31/2020   Sepsis secondary to UTI ([place]) 01/30/2020   History of pulmonary embolism 70/76/1518   Toxic metabolic encephalopathy 34/37/3578   Bladder cancer ([place])    Acute pulmonary embolism without acute cor pulmonale ([place]) 04/05/2018   History of prostate cancer 04/05/2018   Thoracic ascending aortic aneurysm ([place]) 04/05/2018   Sinus bradycardia 09/16/2016   Shortness of breath 09/16/2016   Bradycardia 09/14/2016   1st degree AV block 09/14/2016   Dizziness 09/14/2016   PCP:  Velna Hatchet, MD Pharmacy:   [facility] [phone] - [place], [place] [address] [place] [address] Phone: [phone] Fax: [phone]     Social Determinants of Health (SDOH) Interventions    Readmission Risk Interventions     No data to display

## 2022-03-08 NOTE — Progress Notes (Signed)
PROGRESS NOTE  Connor Morris IDP:[id number] DOB: [birth year] DOA: 03/07/2022 PCP: Velna Hatchet, MD   LOS: 0 days   Brief Narrative / Interim history: 88 y.o. male with medical history significant of bladder cancer, prostate cancer, DVT/PE on xarelto, thoracic aortic aneurysm. Presenting with weakness.  This is been worse over the last couple of days, he seems slower and weaker per family, and also slightly confused.  Family also noticed a cough and a runny nose.  He was found to have lobar pneumonia, placed on antibiotics and admitted to the hospital.  Subjective / 24h Interval events: Feels a little bit better this morning, still weak.  Appears slightly confused  Assesement and Plan: Principal Problem:   PNA (pneumonia) Active Problems:   Severe protein-calorie malnutrition (HCC)   Dehydration   Generalized weakness   Failure to thrive in adult  Principal problem Right lower lobe pneumonia-has been placed on ceftriaxone and azithromycin, continue.  Continue to closely monitor, respiratory status currently stable.  As needed guaifenesin  Active problems Acute metabolic encephalopathy, in the setting of infectious process-still confused this morning.  Continue to closely monitor, he should clear with antibiotics.  Will discuss with family later on today  FTT, dehydration-encourage p.o. intake, RD consult, continue fluids  Elevated LFT-he denies any EtOH intake however pattern looks like that.  Will discuss with family.  AST slightly higher today  Severe protein calorie malnutrition-RD consult  History of prostate and bladder cancer-followed as an outpatient.  Just had a PET scan which showed signs of prostate cancer recurrence in the posterior prostate bed extending towards the left seminal vesicle without any other disease beyond the prostate at this time.  Following with Dr. Milford Cage as an outpatient  Scheduled Meds:  azithromycin  500 mg Oral Daily   latanoprost  1 drop Right  Eye QHS   multivitamin with minerals  1 tablet Oral Daily   rivaroxaban  20 mg Oral Q supper   Continuous Infusions:  sodium chloride 100 mL/hr at 03/08/22 0643   cefTRIAXone (ROCEPHIN)  IV 2 g (03/08/22 0820)   PRN Meds:.acetaminophen **OR** acetaminophen  Diet Orders (From admission, onward)     Start     Ordered   03/07/22 1722  Diet Heart Room service appropriate? Yes; Fluid consistency: Thin  Diet effective now       Question Answer Comment  Room service appropriate? Yes   Fluid consistency: Thin      03/07/22 1721            DVT prophylaxis:  rivaroxaban (XARELTO) tablet 20 mg   Lab Results  Component Value Date   PLT 164 03/08/2022      Code Status: Partial Code  Family Communication: no family at bedside  Status is: Observation  The patient will require care spanning > 2 midnights and should be moved to inpatient because: AMS   Level of care: Telemetry  Consultants:  none   Objective: Vitals:   03/07/22 2052 03/08/22 0015 03/08/22 0518 03/08/22 0854  BP: (!) 184/64 (!) 170/98 (!) 150/62 (!) 134/59  Pulse: 62 (!) 53 (!) 50 (!) 57  Resp: '16 18 16 20  '$ Temp: 99.2 F (37.3 C) 98.4 F (36.9 C) 97.9 F (36.6 C) 97.8 F (36.6 C)  TempSrc: Oral Oral Oral Oral  SpO2: 98% 100% 100% 100%    Intake/Output Summary (Last 24 hours) at 03/08/2022 1052 Last data filed at 03/08/2022 0833 Gross per 24 hour  Intake 959.63 ml  Output 450 ml  Net 509.63 ml   Wt Readings from Last 3 Encounters:  04/13/21 61.9 kg  08/06/20 64.4 kg  03/31/20 64.4 kg    Examination:  Constitutional: NAD Eyes: no scleral icterus ENMT: Mucous membranes are moist.  Neck: normal, supple Respiratory: clear to auscultation bilaterally, no wheezing, no crackles. Normal respiratory effort. No accessory muscle use.  Cardiovascular: Regular rate and rhythm, no murmurs / rubs / gallops. No LE edema.  Abdomen: non distended, no tenderness. Bowel sounds positive.  Musculoskeletal:  no clubbing / cyanosis.  Skin: no rashes Neurologic: non focal  Data Reviewed: I have independently reviewed following labs and imaging studies   CBC Recent Labs  Lab 03/07/22 1025 03/08/22 0508  WBC 8.7 9.9  HGB 13.9 12.9*  HCT 43.3 42.1  PLT 171 164  MCV 88.5 90.7  MCH 28.4 27.8  MCHC 32.1 30.6  RDW 12.8 12.9  LYMPHSABS 0.8  --   MONOABS 0.8  --   EOSABS 0.0  --   BASOSABS 0.0  --     Recent Labs  Lab 03/07/22 1025 03/08/22 0508  NA 139 139  K 4.3 4.1  CL 105 108  CO2 25 23  GLUCOSE 126* 94  BUN 27* 23  CREATININE 1.15 1.23  CALCIUM 9.8 9.0  AST 73* 141*  ALT 23 34  ALKPHOS 44 37*  BILITOT 1.2 1.1  ALBUMIN 4.0 3.2*  LATICACIDVEN 1.6 1.1  INR 2.0*  --     ------------------------------------------------------------------------------------------------------------------ No results for input(s): "CHOL", "HDL", "LDLCALC", "TRIG", "CHOLHDL", "LDLDIRECT" in the last 72 hours.  No results found for: "HGBA1C" ------------------------------------------------------------------------------------------------------------------ No results for input(s): "TSH", "T4TOTAL", "T3FREE", "THYROIDAB" in the last 72 hours.  Invalid input(s): "FREET3"  Cardiac Enzymes No results for input(s): "CKMB", "TROPONINI", "MYOGLOBIN" in the last 168 hours.  Invalid input(s): "CK" ------------------------------------------------------------------------------------------------------------------    Component Value Date/Time   BNP 100.8 (H) 01/30/2020 1806    CBG: No results for input(s): "GLUCAP" in the last 168 hours.  No results found for this or any previous visit (from the past 240 hour(s)).   Radiology Studies: No results found.   Marzetta Board, MD, PhD [facility]  Between 7 am - 7 pm I am available, please contact me via Amion (for emergencies) or Securechat (non urgent messages)  Between 7 pm - 7 am I am not available, please contact night coverage MD/APP  via Amion

## 2022-03-08 NOTE — NC FL2 (Signed)
[place] MEDICAID FL2 LEVEL OF CARE SCREENING TOOL     IDENTIFICATION  Patient Name: Connor Morris Birthdate: 06-30-1934 Sex: male Admission Date (Current Location): 03/07/2022  [facility] and [place] Number:  Herbalist and Address:  [facility],  [place], [place]      Provider Number: [id number]  Attending Physician Name and Address:  Caren Griffins, MD  Relative Name and Phone Number:  Eann Cleland ([phone])    Current Level of Care: Hospital Recommended Level of Care: [place] Prior Approval Number:    Date Approved/Denied:   PASRR Number: [id number]  Discharge Plan: SNF    Current Diagnoses: Patient Active Problem List   Diagnosis Date Noted   Malnutrition of moderate degree 03/08/2022   CAP (community acquired pneumonia) 03/08/2022   PNA (pneumonia) 03/07/2022   Dehydration 03/07/2022   Generalized weakness 03/07/2022   Failure to thrive in adult 03/07/2022   Hematuria 03/25/2020   Severe protein-calorie malnutrition ([place]) 01/31/2020   Sepsis secondary to UTI ([place]) 01/30/2020   History of pulmonary embolism 08/65/7846   Toxic metabolic encephalopathy 96/29/5284   Bladder cancer ([place])    Acute pulmonary embolism without acute cor pulmonale ([place]) 04/05/2018   History of prostate cancer 04/05/2018   Thoracic ascending aortic aneurysm (HCC) 04/05/2018   Sinus bradycardia 09/16/2016   Shortness of breath 09/16/2016   Bradycardia 09/14/2016   1st degree AV block 09/14/2016   Dizziness 09/14/2016    Orientation RESPIRATION BLADDER Height & Weight     Self  Normal Incontinent, External catheter Weight: 146 lb 9.7 oz (66.5 kg) Height:  '5\' 10"'$  (177.8 cm)  BEHAVIORAL SYMPTOMS/MOOD NEUROLOGICAL BOWEL NUTRITION STATUS      Continent Diet (Regular)  AMBULATORY STATUS COMMUNICATION OF NEEDS Skin   Limited Assist Verbally Normal                       Personal Care Assistance Level of  Assistance  Bathing, Feeding, Dressing Bathing Assistance: Limited assistance Feeding assistance: Limited assistance Dressing Assistance: Limited assistance     Functional Limitations Info  Sight, Hearing, Speech Sight Info: Adequate Hearing Info: Adequate Speech Info: Adequate    SPECIAL CARE FACTORS FREQUENCY  PT (By licensed PT), OT (By licensed OT)     PT Frequency: 5x/wk OT Frequency: 5x/wk            Contractures Contractures Info: Not present    Additional Factors Info  Code Status, Allergies Code Status Info: Partial Allergies Info: No Known Allergies           Current Medications (03/08/2022):  This is the current hospital active medication list Current Facility-Administered Medications  Medication Dose Route Frequency Provider Last Rate Last Admin   0.9 %  sodium chloride infusion   Intravenous Continuous Kyle, Tyrone A, DO 100 mL/hr at 03/08/22 0643 New Bag at 03/08/22 0643   acetaminophen (TYLENOL) tablet 650 mg  650 mg Oral Q6H PRN Cherylann Ratel A, DO       Or   acetaminophen (TYLENOL) suppository 650 mg  650 mg Rectal Q6H PRN Marylyn Ishihara, Tyrone A, DO       azithromycin (ZITHROMAX) tablet 500 mg  500 mg Oral Daily Kyle, Tyrone A, DO   500 mg at 03/08/22 0821   cefTRIAXone (ROCEPHIN) 2 g in sodium chloride 0.9 % 100 mL IVPB  2 g Intravenous Q24H Kyle, Tyrone A, DO 200 mL/hr at 03/08/22 0820 2 g at 03/08/22 0820  feeding supplement (ENSURE ENLIVE / ENSURE PLUS) liquid 237 mL  237 mL Oral BID BM Gherghe, Costin M, MD       latanoprost (XALATAN) 0.005 % ophthalmic solution 1 drop  1 drop Right Eye QHS Kyle, Tyrone A, DO   1 drop at 03/07/22 2133   multivitamin with minerals tablet 1 tablet  1 tablet Oral Daily Kyle, Tyrone A, DO   1 tablet at 03/08/22 0820   rivaroxaban (XARELTO) tablet 20 mg  20 mg Oral Q supper Cherylann Ratel A, DO   20 mg at 03/07/22 2130     Discharge Medications: Please see discharge summary for a list of discharge medications.  Relevant  Imaging Results:  Relevant Lab Results:   Additional Information SSN: [id number]  Vassie Moselle, LCSW

## 2022-03-08 NOTE — Progress Notes (Signed)
Initial Nutrition Assessment  DOCUMENTATION CODES:   Non-severe (moderate) malnutrition in context of chronic illness  INTERVENTION:   -Ensure Plus High Protein po BID, each supplement provides 350 kcal and 20 grams of protein.   -Liberalize heart healthy to regular given malnutrition and advanced age.   NUTRITION DIAGNOSIS:   Moderate Malnutrition related to chronic illness (cancer) as evidenced by moderate fat depletion, severe muscle depletion.  GOAL:   Patient will meet greater than or equal to 90% of their needs  MONITOR:   PO intake, Supplement acceptance, Weight trends, Labs, I & O's  REASON FOR ASSESSMENT:   Consult Assessment of nutrition requirement/status  ASSESSMENT:   88 y.o. male with medical history significant of bladder cancer, prostate cancer, DVT/PE on xarelto, thoracic aortic aneurysm. Presenting with weakness.  Patient in room, confused, no family present at bedside. Alert/oriented x 1. Pt able to give limited history. Was not able to tell me what he had for breakfast. Pt states he has a fluctuating appetite, "I have good days". Pt doesn't drink protein supplements at home, states he would rather have a Pepsi. Pt agreeable to trying Ensure. Will change diet to regular to maximize menu options.  Pt unable to tell me what his UBW is. No weight loss noted in weight records.  Medications: Multivitamin with minerals daily  Labs reviewed: Low Phos   NUTRITION - FOCUSED PHYSICAL EXAM:  Flowsheet Row Most Recent Value  Orbital Region Moderate depletion  Upper Arm Region Moderate depletion  Thoracic and Lumbar Region Moderate depletion  Buccal Region Severe depletion  Temple Region Severe depletion  Clavicle Bone Region Severe depletion  Clavicle and Acromion Bone Region Severe depletion  Scapular Bone Region Severe depletion  Dorsal Hand Severe depletion  Patellar Region Moderate depletion  Anterior Thigh Region Moderate depletion  Posterior Calf  Region Moderate depletion  Edema (RD Assessment) None  Hair Reviewed  Eyes Reviewed  Mouth Reviewed  [mising teeth]  Skin Reviewed  [dry]  Nails Reviewed       Diet Order:   Diet Order             Diet Heart Room service appropriate? Yes; Fluid consistency: Thin  Diet effective now                   EDUCATION NEEDS:   Not appropriate for education at this time  Skin:  Skin Assessment: Reviewed RN Assessment  Last BM:  PTA  Height:   Ht Readings from Last 1 Encounters:  03/08/22 '5\' 10"'$  (1.778 m)    Weight:   Wt Readings from Last 1 Encounters:  03/08/22 66.5 kg    BMI:  Body mass index is 21.04 kg/m.  Estimated Nutritional Needs:   Kcal:  1900-2100  Protein:  85-95g  Fluid:  2L/day   Clayton Bibles, MS, RD, LDN Inpatient Clinical Dietitian Contact information available via Amion

## 2022-03-09 DIAGNOSIS — J189 Pneumonia, unspecified organism: Secondary | ICD-10-CM | POA: Diagnosis not present

## 2022-03-09 LAB — COMPREHENSIVE METABOLIC PANEL
ALT: 34 U/L (ref 0–44)
AST: 93 U/L — ABNORMAL HIGH (ref 15–41)
Albumin: 2.9 g/dL — ABNORMAL LOW (ref 3.5–5.0)
Alkaline Phosphatase: 35 U/L — ABNORMAL LOW (ref 38–126)
Anion gap: 5 (ref 5–15)
BUN: 21 mg/dL (ref 8–23)
CO2: 24 mmol/L (ref 22–32)
Calcium: 8.8 mg/dL — ABNORMAL LOW (ref 8.9–10.3)
Chloride: 113 mmol/L — ABNORMAL HIGH (ref 98–111)
Creatinine, Ser: 1.1 mg/dL (ref 0.61–1.24)
GFR, Estimated: >60 mL/min (ref >60)
Glucose, Bld: 101 mg/dL — ABNORMAL HIGH (ref 70–99)
Potassium: 3.7 mmol/L (ref 3.5–5.1)
Sodium: 142 mmol/L (ref 135–145)
Total Bilirubin: 1.2 mg/dL (ref 0.3–1.2)
Total Protein: 6.7 g/dL (ref 6.5–8.1)

## 2022-03-09 LAB — LEGIONELLA PNEUMOPHILA SEROGP 1 UR AG: L. pneumophila Serogp 1 Ur Ag: NEGATIVE

## 2022-03-09 LAB — CBC
HCT: 39 % (ref 39.0–52.0)
Hemoglobin: 12.1 g/dL — ABNORMAL LOW (ref 13.0–17.0)
MCH: 28 pg (ref 26.0–34.0)
MCHC: 31 g/dL (ref 30.0–36.0)
MCV: 90.3 fL (ref 80.0–100.0)
Platelets: 163 10*3/uL (ref 150–400)
RBC: 4.32 MIL/uL (ref 4.22–5.81)
RDW: 12.8 % (ref 11.5–15.5)
WBC: 6.7 10*3/uL (ref 4.0–10.5)
nRBC: 0 % (ref 0.0–0.2)

## 2022-03-09 LAB — MAGNESIUM: Magnesium: 2.2 mg/dL (ref 1.7–2.4)

## 2022-03-09 MED ORDER — ORAL CARE MOUTH RINSE
15.0000 mL | OROMUCOSAL | Status: DC | PRN
Start: 2022-03-09 — End: 2022-03-11

## 2022-03-09 MED ORDER — ORAL CARE MOUTH RINSE
15.0000 mL | OROMUCOSAL | Status: DC
  Administered 2022-03-09 – 2022-03-11 (×8): 15 mL via OROMUCOSAL

## 2022-03-09 NOTE — TOC Progression Note (Signed)
Transition of Care [facility]) - Progression Note    Patient Details  Name: [name] MRN: [id number] Date of Birth: 1934-06-02  Transition of [facility]) CM/SW [place], LCSW Phone Number: 03/09/2022, 9:31 AM  Clinical Narrative:    Spoke with pt's daughter, [name] and reviewed bed offers for SNF. Pt's daughter is to review facilities prior to making decision on placement for this pt.    Expected Discharge Plan: [place] Barriers to Discharge: Continued Medical Work up  Expected Discharge Plan and Services Expected Discharge Plan: [place] In-house Referral: Clinical Social Work Discharge Planning Services: CM Consult Post Acute Care Choice: [place] Living arrangements for the past 2 months: Single Family Home                 DME Arranged: N/A DME Agency: NA                   Social Determinants of Health (SDOH) Interventions    Readmission Risk Interventions     No data to display

## 2022-03-09 NOTE — Evaluation (Signed)
Occupational Therapy Evaluation Patient Details Name: Connor Morris MRN: [id number] DOB: March 01, 1934 Today's Date: 03/09/2022   History of Present Illness 88 y.o. male with medical history significant of bladder cancer, prostate cancer, DVT/PE on xarelto, thoracic aortic aneurysm. Presenting with weakness.  This is been worse over the last couple of days, he seems slower and weaker per family, and also slightly confused.  Family also noticed a cough and a runny nose.  He was found to have lobar pneumonia   Clinical Impression   Patient is a 88 year old male who was admitted to the hospital for above. Patient was noted to have increased L hip pain and poor safety awareness impacting participation in ADLs. Unclear level of assistance available at home with no family present in room at this time. Patient was noted to have decreased functional activity tolerance, decreased endurance, decreased standing balance, decreased safety awareness, and decreased knowledge of AD/AE impacting participation in ADLs. Patient would continue to benefit from skilled OT services at this time while admitted and after d/c to address noted deficits in order to improve overall safety and independence in ADLs.       Recommendations for follow up therapy are one component of a multi-disciplinary discharge planning process, led by the attending physician.  Recommendations may be updated based on patient status, additional functional criteria and insurance authorization.   Follow Up Recommendations  Skilled nursing-short term rehab (<3 hours/day)    Assistance Recommended at Discharge Frequent or constant Supervision/Assistance  Patient can return home with the following A little help with walking and/or transfers;A little help with bathing/dressing/bathroom;Assistance with cooking/housework;Direct supervision/assist for financial management;Assist for transportation;Help with stairs or ramp for entrance;Direct  supervision/assist for medications management    Functional Status Assessment  Patient has had a recent decline in their functional status and demonstrates the ability to make significant improvements in function in a reasonable and predictable amount of time.  Equipment Recommendations  None recommended by OT    Recommendations for Other Services       Precautions / Restrictions Precautions Precautions: None Restrictions Weight Bearing Restrictions: No      Mobility Bed Mobility               General bed mobility comments: patient was up in recliner and returned to the same.    Transfers                          Balance Overall balance assessment: Needs assistance Sitting-balance support: Feet supported, No upper extremity supported Sitting balance-Leahy Scale: Good     Standing balance support: Reliant on assistive device for balance, Bilateral upper extremity supported Standing balance-Leahy Scale: Poor                             ADL either performed or assessed with clinical judgement   ADL Overall ADL's : Needs assistance/impaired Eating/Feeding: Set up;Sitting   Grooming: Set up;Sitting Grooming Details (indicate cue type and reason): with increased time Upper Body Bathing: Minimal assistance;Sitting   Lower Body Bathing: Moderate assistance;Sit to/from stand;Sitting/lateral leans Lower Body Bathing Details (indicate cue type and reason): simulated reaching task Upper Body Dressing : Minimal assistance;Sitting   Lower Body Dressing: Maximal assistance;Sit to/from stand Lower Body Dressing Details (indicate cue type and reason): patient was min A to don/doff socks with patient attempting to bring feet to lap. L hip pain impacted participation. Toilet Transfer:  Moderate assistance;Rolling walker (2 wheels) Toilet Transfer Details (indicate cue type and reason): with cues for proper hand and foot placement. Toileting- Clothing  Manipulation and Hygiene: Maximal assistance;Sit to/from stand Toileting - Clothing Manipulation Details (indicate cue type and reason): needs BUE assistance for standing balance.             Vision   Vision Assessment?: No apparent visual deficits     Perception     Praxis      Pertinent Vitals/Pain Pain Assessment Pain Assessment: Faces Faces Pain Scale: Hurts even more Pain Location: L hip to back Pain Descriptors / Indicators: Grimacing, Guarding Pain Intervention(s): Limited activity within patient's tolerance, Monitored during session, Patient requesting pain meds-RN notified, RN gave pain meds during session     Hand Dominance     Extremity/Trunk Assessment Upper Extremity Assessment Upper Extremity Assessment: Overall WFL for tasks assessed   Lower Extremity Assessment Lower Extremity Assessment: Defer to PT evaluation   Cervical / Trunk Assessment Cervical / Trunk Assessment: Kyphotic   Communication Communication Communication: No difficulties   Cognition Arousal/Alertness: Awake/alert Behavior During Therapy: WFL for tasks assessed/performed Overall Cognitive Status: Impaired/Different from baseline                                 General Comments: patient was noted to have some confusion during speech with difficulty understanding patient at times. patient was unaware of current location     General Comments       Exercises     Shoulder Instructions      Home Living Family/patient expects to be discharged to:: Private residence Living Arrangements: Alone Available Help at Discharge: Family;Available PRN/intermittently Type of Home: House Home Access: Stairs to enter Entrance Stairs-Number of Steps: 2 Entrance Stairs-Rails: Right;Left                 Home Equipment: Conservation officer, nature (2 wheels);Cane - single point   Additional Comments: pt stated he uses a cane or a walker when ambulating, that his son stops by to help as  needed. Pt not oriented to location at present, so unclear if info he provided is accurate. patient reported having a chair to take him up the stairs.      Prior Functioning/Environment Prior Level of Function : Patient poor historian/Family not available             Mobility Comments: walks with either a cane or walker ADLs Comments: reports son assists with bathing as needed        OT Problem List: Decreased strength;Decreased activity tolerance;Impaired balance (sitting and/or standing);Decreased safety awareness;Decreased knowledge of precautions;Decreased knowledge of use of DME or AE;Pain      OT Treatment/Interventions: Self-care/ADL training;Therapeutic exercise;Neuromuscular education;Energy conservation;DME and/or AE instruction;Therapeutic activities;Balance training;Patient/family education    OT Goals(Current goals can be found in the care plan section) Acute Rehab OT Goals Patient Stated Goal: to get back in recliner OT Goal Formulation: Patient unable to participate in goal setting Time For Goal Achievement: 03/23/22 Potential to Achieve Goals: Fair  OT Frequency: Min 2X/week    Co-evaluation              AM-PAC OT "6 Clicks" Daily Activity     Outcome Measure Help from another person eating meals?: A Little Help from another person taking care of personal grooming?: A Little Help from another person toileting, which includes using toliet, bedpan, or urinal?: A Lot Help  from another person bathing (including washing, rinsing, drying)?: A Lot Help from another person to put on and taking off regular upper body clothing?: A Little Help from another person to put on and taking off regular lower body clothing?: A Lot 6 Click Score: 15   End of Session Equipment Utilized During Treatment: Gait belt;Rolling walker (2 wheels)  Activity Tolerance: Patient tolerated treatment well Patient left: in chair;with call bell/phone within reach;with chair alarm  set  OT Visit Diagnosis: Unsteadiness on feet (R26.81);Muscle weakness (generalized) (M62.81);History of falling (Z91.81);Pain Pain - Right/Left: Left Pain - part of body: Hip;Leg                Time: 4360-6770 OT Time Calculation (min): 15 min Charges:  OT General Charges $OT Visit: 1 Visit OT Evaluation $OT Eval Moderate Complexity: 1 Mod  Jackelyn Poling OTR/L, MS Acute Rehabilitation Department Office# [phone] Pager# [phone]   Marcellina Millin 03/09/2022, 12:20 PM

## 2022-03-09 NOTE — Evaluation (Signed)
Clinical/Bedside Swallow Evaluation Patient Details  Name: Connor Morris MRN: [id number] Date of Birth: [birth year]  Today's Date: 03/09/2022 Time: SLP Start Time (ACUTE ONLY): 44 SLP Stop Time (ACUTE ONLY): 1420 SLP Time Calculation (min) (ACUTE ONLY): 15 min  Past Medical History:  Past Medical History:  Diagnosis Date   1st degree AV block    AAA (abdominal aortic aneurysm) ([place]) 03/2018   followed by dr Cyndia Bent--- last CTA  4.6cm   Anticoagulated 03/2018   xarelto for hx pe/ dvt--- managed by pcp   Arthritis    Emphysema lung ([place])    Hiatal hernia    History of bladder cancer    urologist--- dr Milford Cage   s/p TURBT feb and march 2021   History of DVT of lower extremity 03/2018   LLE   History of prostate cancer 2002   s/p radioactive seed implants 04-11-2001   History of pulmonary embolus (PE) 03/2018   left side--  on xarelto   History of sepsis 01/30/2020   hospital admission in epic;  due to UTI   Impaired memory    Retained ureteral stent    right side   Sinus bradycardia 2018   cardiologist--- dr Cathie Olden   Urgency of urination    Past Surgical History:  Past Surgical History:  Procedure Laterality Date   CATARACT EXTRACTION W/ INTRAOCULAR LENS IMPLANT Right 2020   CYSTOSCOPY N/A 10/30/2019   Procedure: CYSTOSCOPY;  Surgeon: Cleon Gustin, MD;  [facility];  Service: Urology;  Laterality: N/A;   CYSTOSCOPY W/ RETROGRADES Bilateral 09/20/2019   Procedure: CYSTOSCOPY WITH RETROGRADE PYELOGRAM;  Surgeon: Cleon Gustin, MD;  Location: [facility];  Service: Urology;  Laterality: Bilateral;   CYSTOSCOPY W/ URETERAL STENT REMOVAL N/A 03/25/2020   Procedure: CYSTOSCOPY WITH  REMOVAL OF RETAINED FOLEY CATHETER,  FULGERATION;  Surgeon: Remi Haggard, MD;  Location[facility];  Service: Urology;  Laterality: N/A;   FOOT SURGERY  yrs ago   RADIOACTIVE PROSTATE SEED IMPLANTS  04-11-2001  dr Janice Norrie  '@[email or web address]'$     TRANSURETHRAL RESECTION OF BLADDER TUMOR N/A 09/20/2019   Procedure: TRANSURETHRAL RESECTION OF BLADDER TUMOR (TURBT);  Surgeon: Cleon Gustin, MD;  Location: [facility];  Service: Urology;  Laterality: N/A;  1 HR   TRANSURETHRAL RESECTION OF BLADDER TUMOR N/A 10/30/2019   Procedure: TRANSURETHRAL RESECTION OF BLADDER TUMOR (TURBT), REMOVAL OF RIGHT STENT;  Surgeon: Cleon Gustin, MD;  [facility];  Service: Urology;  Laterality: N/A;   HPI:  Patient is an 88 y.o. male with PMH: bladder cancer, prostate cancer, DVT/PE, thoracic aortic aneurysm, hiatal hernia, emphysema lung, impaired memory. He presented to the hospital on 03/07/22 with c/o weakness with daughter noticing he seemed slower and weaker and slightly confused. He has had poor PO intake as well. In ED, C XR showed new hazy opacity within the right lower lobe which may early pneumonia. During PT evaluation on 7/24, patient observed to cough with sip of water and SLP swallow eval ordered.    Assessment / Plan / Recommendation  Clinical Impression  Patient not currently presenting with clinical s/s of an oral or pharyngeal phase dysphagia but suspect esophageal phase dysphagia based on patient's h/o hiatal hernia, self report of having difficulty swallowing some foods (unable to specify) and SLP's observation of patient belching several times after drinking water. No overt s/s aspiration or penetration observed. Both patient and daughter deny any history of significant  dysphagia and patient's RN denied any recent observations concerning for dysphagia. SLP is recommending patient continue on current diet and at this time, not recommending further SLP intervention. Please reorder if any observed change in patient's swallow function. Thank you for this consult! SLP Visit Diagnosis: Dysphagia, unspecified (R13.10)    Aspiration Risk       Diet Recommendation Regular;Thin liquid   Liquid  Administration via: Cup;Straw Medication Administration: Whole meds with liquid Supervision: Patient able to self feed Compensations: Slow rate;Small sips/bites Postural Changes: Remain upright for at least 30 minutes after po intake;Seated upright at 90 degrees    Other  Recommendations Oral Care Recommendations: Oral care BID    Recommendations for follow up therapy are one component of a multi-disciplinary discharge planning process, led by the attending physician.  Recommendations may be updated based on patient status, additional functional criteria and insurance authorization.  Follow up Recommendations No SLP follow up      Assistance Recommended at Discharge None  Functional Status Assessment Patient has had a recent decline in their functional status and demonstrates the ability to make significant improvements in function in a reasonable and predictable amount of time.  Frequency and Duration            Prognosis        Swallow Study   General Date of Onset: 03/08/22 HPI: Patient is an 88 y.o. male with PMH: bladder cancer, prostate cancer, DVT/PE, thoracic aortic aneurysm, hiatal hernia, emphysema lung, impaired memory. He presented to the hospital on 03/07/22 with c/o weakness with daughter noticing he seemed slower and weaker and slightly confused. He has had poor PO intake as well. In ED, C XR showed new hazy opacity within the right lower lobe which may early pneumonia. During PT evaluation on 7/24, patient observed to cough with sip of water and SLP swallow eval ordered. Type of Study: Bedside Swallow Evaluation Diet Prior to this Study: Thin liquids;Regular Temperature Spikes Noted: No Respiratory Status: Room air History of Recent Intubation: No Behavior/Cognition: Alert;Cooperative;Pleasant mood Oral Cavity Assessment: Within Functional Limits Oral Care Completed by SLP: No Oral Cavity - Dentition: Missing dentition Vision: Functional for  self-feeding Self-Feeding Abilities: Able to feed self Patient Positioning: Upright in chair Baseline Vocal Quality: Normal Volitional Cough: Strong Volitional Swallow: Able to elicit    Oral/Motor/Sensory Function Overall Oral Motor/Sensory Function: Within functional limits   Ice Chips     Thin Liquid Thin Liquid: Within functional limits Presentation: Straw;Self Fed    Nectar Thick     Honey Thick     Puree Puree: Not tested   Solid     Solid: Not tested     Sonia Baller, MA, CCC-SLP Speech Therapy

## 2022-03-09 NOTE — Progress Notes (Signed)
PROGRESS NOTE  Korver Graybeal DPO:[id number] DOB: [birth year] DOA: 03/07/2022 PCP: Velna Hatchet, MD   LOS: 1 day   Brief Narrative / Interim history: 88 y.o. male with medical history significant of bladder cancer, prostate cancer, DVT/PE on xarelto, thoracic aortic aneurysm. Presenting with weakness.  This is been worse over the last couple of days, he seems slower and weaker per family, and also slightly confused.  Family also noticed a cough and a runny nose.  He was found to have lobar pneumonia, placed on antibiotics and admitted to the hospital.  Subjective / 24h Interval events: Eating breakfast.  No significant complaints.  Denies any shortness of breath, denies any chest pain.  Assesement and Plan: Principal Problem:   PNA (pneumonia) Active Problems:   Severe protein-calorie malnutrition (HCC)   Dehydration   Generalized weakness   Failure to thrive in adult   Malnutrition of moderate degree   CAP (community acquired pneumonia)  Principal problem Right lower lobe pneumonia-has been placed on ceftriaxone and azithromycin, continue.  Continue to closely monitor, respiratory status currently stable.  Cultures have remained negative.  As needed guaifenesin.  Remains on room air this morning and otherwise comfortable.  There was a concern about aspiration, speech to see today  Active problems Acute metabolic encephalopathy, in the setting of infectious process-still somewhat confused this morning, discussed with daughter over the phone and she tells me that she suspects some degree of mild dementia.  Possibly that he is at baseline.  FTT, dehydration-encourage p.o. intake, RD consult.  Received IVF for the past 2 days, discontinue fluids today  Elevated LFT-he denies any EtOH intake however pattern looks like that.  Improving today  Severe protein calorie malnutrition-RD consult  History of prostate and bladder cancer-followed as an outpatient.  Just had a PET scan which  showed signs of prostate cancer recurrence in the posterior prostate bed extending towards the left seminal vesicle without any other disease beyond the prostate at this time.  Following with Dr. Milford Cage as an outpatient, recommend ongoing outpatient follow-up  Scheduled Meds:  azithromycin  500 mg Oral Daily   feeding supplement  237 mL Oral BID BM   latanoprost  1 drop Right Eye QHS   multivitamin with minerals  1 tablet Oral Daily   mouth rinse  15 mL Mouth Rinse 4 times per day   rivaroxaban  20 mg Oral Q supper   Continuous Infusions:  sodium chloride 100 mL/hr at 03/09/22 0429   cefTRIAXone (ROCEPHIN)  IV 2 g (03/09/22 0845)   PRN Meds:.acetaminophen **OR** acetaminophen, mouth rinse  Diet Orders (From admission, onward)     Start     Ordered   03/08/22 1153  Diet regular Room service appropriate? Yes with Assist; Fluid consistency: Thin  Diet effective now       Question Answer Comment  Room service appropriate? Yes with Assist   Fluid consistency: Thin      03/08/22 1153            DVT prophylaxis:  rivaroxaban (XARELTO) tablet 20 mg   Lab Results  Component Value Date   PLT 163 03/09/2022      Code Status: Partial Code  Family Communication: no family at bedside, daughter updated over the phone  Status is: Inpatient, awaiting placement   Level of care: Telemetry  Consultants:  none   Objective: Vitals:   03/09/22 0612 03/09/22 0727 03/09/22 0902 03/09/22 1005  BP: (!) 179/59  (!) 125/57   Pulse: Marland Kitchen)  49     Resp: 18 (!) 39 17   Temp: 98 F (36.7 C)   98.1 F (36.7 C)  TempSrc: Oral   Oral  SpO2: 98%     Weight:      Height:        Intake/Output Summary (Last 24 hours) at 03/09/2022 1026 Last data filed at 03/09/2022 0732 Gross per 24 hour  Intake 700 ml  Output --  Net 700 ml    Wt Readings from Last 3 Encounters:  03/08/22 66.5 kg  04/13/21 61.9 kg  08/06/20 64.4 kg    Examination:  Constitutional: NAD Eyes: lids and  conjunctivae normal, no scleral icterus ENMT: mmm Neck: normal, supple Respiratory: clear to auscultation bilaterally, no wheezing, no crackles. Normal respiratory effort.  Cardiovascular: Regular rate and rhythm, no murmurs / rubs / gallops. No LE edema. Abdomen: soft, no distention, no tenderness. Bowel sounds positive.  Skin: no rashes Neurologic: no focal deficits, equal strength  Data Reviewed: I have independently reviewed following labs and imaging studies   CBC Recent Labs  Lab 03/07/22 1025 03/08/22 0508 03/09/22 0452  WBC 8.7 9.9 6.7  HGB 13.9 12.9* 12.1*  HCT 43.3 42.1 39.0  PLT 171 164 163  MCV 88.5 90.7 90.3  MCH 28.4 27.8 28.0  MCHC 32.1 30.6 31.0  RDW 12.8 12.9 12.8  LYMPHSABS 0.8  --   --   MONOABS 0.8  --   --   EOSABS 0.0  --   --   BASOSABS 0.0  --   --      Recent Labs  Lab 03/07/22 1025 03/08/22 0508 03/09/22 0452  NA 139 139 142  K 4.3 4.1 3.7  CL 105 108 113*  CO2 '25 23 24  '$ GLUCOSE 126* 94 101*  BUN 27* 23 21  CREATININE 1.15 1.23 1.10  CALCIUM 9.8 9.0 8.8*  AST 73* 141* 93*  ALT 23 34 34  ALKPHOS 44 37* 35*  BILITOT 1.2 1.1 1.2  ALBUMIN 4.0 3.2* 2.9*  MG  --   --  2.2  LATICACIDVEN 1.6 1.1  --   INR 2.0*  --   --      ------------------------------------------------------------------------------------------------------------------ No results for input(s): "CHOL", "HDL", "LDLCALC", "TRIG", "CHOLHDL", "LDLDIRECT" in the last 72 hours.  No results found for: "HGBA1C" ------------------------------------------------------------------------------------------------------------------ No results for input(s): "TSH", "T4TOTAL", "T3FREE", "THYROIDAB" in the last 72 hours.  Invalid input(s): "FREET3"  Cardiac Enzymes No results for input(s): "CKMB", "TROPONINI", "MYOGLOBIN" in the last 168 hours.  Invalid input(s): "CK" ------------------------------------------------------------------------------------------------------------------     Component Value Date/Time   BNP 100.8 (H) 01/30/2020 1806    CBG: No results for input(s): "GLUCAP" in the last 168 hours.  Recent Results (from the past 240 hour(s))  Urine Culture     Status: Abnormal   Collection Time: 03/07/22  8:22 AM   Specimen: Urine, Clean Catch  Result Value Ref Range Status   Specimen Description   Final    URINE, CLEAN CATCH Performed at [facility], [place] [address], [place], [place] [address]    Special Requests   Final    NONE Performed at [facility], [place] [address], [place], [place] [address]    Culture MULTIPLE SPECIES PRESENT, SUGGEST RECOLLECTION (A)  Final   Report Status 03/08/2022 FINAL  Final  Blood Culture (routine x 2)     Status: None (Preliminary result)   Collection Time: 03/07/22 10:30 AM   Specimen: BLOOD  Result Value  Ref Range Status   Specimen Description   Final    BLOOD RIGHT ANTECUBITAL Performed at [facility] [address] [place], [place] [address]    Special Requests   Final    BOTTLES DRAWN AEROBIC AND ANAEROBIC Blood Culture adequate volume Performed at [facility] [address], [place], [place] [address]    Culture   Final    NO GROWTH 2 DAYS Performed at [facility] [address], [place], [place] [address]    Report Status PENDING  Incomplete  Blood Culture (routine x 2)     Status: None (Preliminary result)   Collection Time: 03/07/22 11:01 AM   Specimen: BLOOD  Result Value Ref Range Status   Specimen Description   Final    BLOOD LEFT ANTECUBITAL Performed at [facility] [address], [place], [place] [address]    Special Requests   Final    BOTTLES DRAWN AEROBIC AND ANAEROBIC Blood Culture adequate volume Performed at [facility] [address], [place], [place] [address]    Culture   Final    NO GROWTH 2 DAYS Performed at [facility] [address],  [place], [place] [address]    Report Status PENDING  Incomplete     Radiology Studies: No results found.   Marzetta Board, MD, PhD [facility]  Between 7 am - 7 pm I am available, please contact me via Amion (for emergencies) or Securechat (non urgent messages)  Between 7 pm - 7 am I am not available, please contact night coverage MD/APP via Amion

## 2022-03-10 DIAGNOSIS — R627 Adult failure to thrive: Secondary | ICD-10-CM | POA: Diagnosis not present

## 2022-03-10 DIAGNOSIS — R531 Weakness: Secondary | ICD-10-CM | POA: Diagnosis not present

## 2022-03-10 DIAGNOSIS — E860 Dehydration: Secondary | ICD-10-CM | POA: Diagnosis not present

## 2022-03-10 DIAGNOSIS — E43 Unspecified severe protein-calorie malnutrition: Secondary | ICD-10-CM

## 2022-03-10 LAB — BASIC METABOLIC PANEL
Anion gap: 6 (ref 5–15)
BUN: 18 mg/dL (ref 8–23)
CO2: 25 mmol/L (ref 22–32)
Calcium: 9 mg/dL (ref 8.9–10.3)
Chloride: 109 mmol/L (ref 98–111)
Creatinine, Ser: 0.87 mg/dL (ref 0.61–1.24)
GFR, Estimated: >60 mL/min (ref >60)
Glucose, Bld: 110 mg/dL — ABNORMAL HIGH (ref 70–99)
Potassium: 3.9 mmol/L (ref 3.5–5.1)
Sodium: 140 mmol/L (ref 135–145)

## 2022-03-10 LAB — CBC
HCT: 42.1 % (ref 39.0–52.0)
Hemoglobin: 13.2 g/dL (ref 13.0–17.0)
MCH: 28.1 pg (ref 26.0–34.0)
MCHC: 31.4 g/dL (ref 30.0–36.0)
MCV: 89.6 fL (ref 80.0–100.0)
Platelets: 178 10*3/uL (ref 150–400)
RBC: 4.7 MIL/uL (ref 4.22–5.81)
RDW: 12.9 % (ref 11.5–15.5)
WBC: 7 10*3/uL (ref 4.0–10.5)
nRBC: 0 % (ref 0.0–0.2)

## 2022-03-10 LAB — HEPATIC FUNCTION PANEL
ALT: 46 U/L — ABNORMAL HIGH (ref 0–44)
AST: 81 U/L — ABNORMAL HIGH (ref 15–41)
Albumin: 3 g/dL — ABNORMAL LOW (ref 3.5–5.0)
Alkaline Phosphatase: 45 U/L (ref 38–126)
Bilirubin, Direct: 0.3 mg/dL — ABNORMAL HIGH (ref 0.0–0.2)
Indirect Bilirubin: 0.6 mg/dL (ref 0.3–0.9)
Total Bilirubin: 0.9 mg/dL (ref 0.3–1.2)
Total Protein: 7 g/dL (ref 6.5–8.1)

## 2022-03-10 MED ORDER — ENSURE ENLIVE PO LIQD: 237.0000 mL | Freq: Two times a day (BID) | ORAL | 12 refills | Status: AC

## 2022-03-10 MED ORDER — CEFDINIR 300 MG PO CAPS: 300.0000 mg | ORAL_CAPSULE | Freq: Two times a day (BID) | ORAL | 0 refills | Status: AC

## 2022-03-10 MED ORDER — ACETAMINOPHEN 325 MG PO TABS: 650.0000 mg | ORAL_TABLET | Freq: Four times a day (QID) | ORAL | Status: AC | PRN

## 2022-03-10 MED ORDER — AZITHROMYCIN 500 MG PO TABS: 500.0000 mg | ORAL_TABLET | Freq: Every day | ORAL | Status: AC

## 2022-03-10 NOTE — Plan of Care (Signed)

## 2022-03-10 NOTE — TOC Progression Note (Addendum)
Transition of Care [facility]) - Progression Note    Patient Details  Name: Connor Morris MRN: [id number] Date of Birth: Jun 29, 1934  Transition of Care [facility]) CM/SW [place], [place] Phone Number: 03/10/2022, 9:39 AM  Clinical Narrative:    Pt/family accepted bed offer for SNF placement at [facility]. Insurance Josem Kaufmann has been started and currently pending approval.   Update 1032: Pt's insurance has been approved and is valid from 7/26 through 7/28. ID #[id number].   Expected Discharge Plan: [place] Barriers to Discharge: Continued Medical Work up  Expected Discharge Plan and Services Expected Discharge Plan: [place] In-house Referral: Clinical Social Work Discharge Planning Services: CM Consult Post Acute Care Choice: [place] Living arrangements for the past 2 months: Single Family Home                 DME Arranged: N/A DME Agency: NA                   Social Determinants of Health (SDOH) Interventions    Readmission Risk Interventions    03/10/2022    9:39 AM  Readmission Risk Prevention Plan  Post Dischage Appt Complete  Medication Screening Complete  Transportation Screening Complete

## 2022-03-10 NOTE — Discharge Summary (Signed)
Physician Discharge Summary   Patient: Connor Morris MRN: [id number] DOB: [birth year]  Admit date:     03/07/2022  Discharge date: 03/10/22  Discharge Physician: Kerney Elbe   PCP: Velna Hatchet, MD   Recommendations at discharge:    Follow up with PCP within 1 to 2 weeks  Discharge Diagnoses: Principal Problem:   PNA (pneumonia) Active Problems:   Severe protein-calorie malnutrition ([place])   Dehydration   Generalized weakness   Failure to thrive in adult   Malnutrition of moderate degree   CAP (community acquired pneumonia)  Resolved Problems:   * No resolved hospital problems. *  Hospital Course: 88 y.o. male with medical history significant of bladder cancer, prostate cancer, DVT/PE on xarelto, thoracic aortic aneurysm. Presenting with weakness.  This is been worse over the last couple of days, he seems slower and weaker per family, and also slightly confused.  Family also noticed a cough and a runny nose.  He was found to have lobar pneumonia, placed on antibiotics and admitted to the hospital.  His pneumonia is improving and he is not requiring supplemental oxygen.  He is deemed medically stable to be discharged to SNF after PT OT evaluated.  Assessment and Plan: No notes have been filed under this hospital service. Service: Hospitalist  Right lower lobe pneumonia-has been placed on ceftriaxone and azithromycin, continue.  Continue to closely monitor, respiratory status currently stable.  Cultures have remained negative.  As needed guaifenesin.  Remains on room air this morning and otherwise comfortable.  There was a concern about aspiration, speech to see today   Acute metabolic encephalopathy, in the setting of infectious process-still somewhat confused this morning, discussed with daughter over the phone and she tells me that she suspects some degree of mild dementia.  Possibly that he is at baseline.  FTT, dehydration-encourage p.o. intake, RD consult.  Received  IVF for the past 2 days, discontinue fluids yesterday    Elevated LFT-he denies any EtOH intake however pattern looks like that.  Improving today   Severe protein calorie malnutrition-RD consult   History of prostate and bladder cancer-followed as an outpatient.  Just had a PET scan which showed signs of prostate cancer recurrence in the posterior prostate bed extending towards the left seminal vesicle without any other disease beyond the prostate at this time.  Following with Dr. Milford Cage as an outpatient, recommend ongoing outpatient follow-up    Nutrition Documentation    [facility] to Hosp-Admission (Current) from 03/07/2022 in [facility]  Nutrition Problem Moderate Malnutrition  Etiology chronic illness  [cancer]  Nutrition Goal Patient will meet greater than or equal to 90% of their needs  Interventions Liberalize Diet, Ensure Enlive (each supplement provides 350kcal and 20 grams of protein), MVI       Consultants:None Procedures performed: None   Disposition: Skilled nursing facility Diet recommendation:  Discharge Diet Orders (From admission, onward)     Start     Ordered   03/10/22 0000  Diet general        03/10/22 1253           Regular diet DISCHARGE MEDICATION: Allergies as of 03/10/2022   No Known Allergies      Medication List     TAKE these medications    acetaminophen 325 MG tablet Commonly known as: TYLENOL Take 2 tablets (650 mg total) by mouth every 6 (six) hours as needed for mild pain (or Fever >/= 101).  azithromycin 500 MG tablet Commonly known as: ZITHROMAX Take 1 tablet (500 mg total) by mouth daily for 3 days.   cefdinir 300 MG capsule Commonly known as: OMNICEF Take 1 capsule (300 mg total) by mouth 2 (two) times daily for 5 days.   feeding supplement Liqd Take 237 mLs by mouth 2 (two) times daily between meals.   latanoprost 0.005 % ophthalmic solution Commonly known as:  XALATAN Place 1 drop into the right eye at bedtime.   multivitamin with minerals tablet Take 1 tablet by mouth daily.   rivaroxaban 20 MG Tabs tablet Commonly known as: XARELTO Take 20 mg by mouth daily with supper.        Contact information for after-discharge care     Destination     [facility] Preferred SNF .   Service: Skilled Nursing Contact information: [address] [place] [phone]                    Discharge Exam: Danley Danker Weights   03/08/22 1000  Weight: 66.5 kg   Vitals:   03/10/22 0320 03/10/22 1303  BP: (!) 192/65 (!) 145/65  Pulse: (!) 56 64  Resp: 16 16  Temp: 98.6 F (37 C) 97.9 F (36.6 C)  SpO2: 99% 98%   Examination: Physical Exam:  Constitutional: Thin AAM in NAD Respiratory: Diminished to auscultation bilaterally, no wheezing, rales, rhonchi or crackles. Normal respiratory effort and patient is not tachypenic. No accessory muscle use. Unlabored breathing  Cardiovascular: RRR, no murmurs / rubs / gallops. S1 and S2 auscultated. No extremity edema Abdomen: Soft, non-tender, non-distended. Bowel sounds positive.  GU: Deferred. Musculoskeletal: No clubbing / cyanosis of digits/nails. No joint deformity upper and lower extremities.  Skin: No rashes, lesions, ulcers on a limited skin evaluation. No induration; Warm and dry.  Neurologic: CN 2-12 grossly intact with no focal deficits. Romberg sign and cerebellar reflexes not assessed.  Psychiatric: Normal judgment and insight. Alert and oriented x 3. Normal mood and appropriate affect.   Condition at discharge: stable  The results of significant diagnostics from this hospitalization (including imaging, microbiology, ancillary and laboratory) are listed below for reference.   Imaging Studies: DG Chest Port 1 View  Result Date: 03/07/2022 CLINICAL DATA:  Questionable sepsis.  Evaluate for abnormality. EXAM: PORTABLE CHEST 1 VIEW  COMPARISON:  01/30/2020 FINDINGS: Aortic atherosclerotic calcifications. Stable cardiomediastinal contours with aortic tortuosity. Lung volumes are low. New hazy opacity is identified within the right lower. Left lung appears clear. No signs of pleural effusion or edema. IMPRESSION: New hazy opacity within the right lower lobe which may early pneumonia. Electronically Signed   By: Kerby Moors M.D.   On: 03/07/2022 10:43    Microbiology: Results for orders placed or performed during the hospital encounter of 03/07/22  Urine Culture     Status: Abnormal   Collection Time: 03/07/22  8:22 AM   Specimen: Urine, Clean Catch  Result Value Ref Range Status   Specimen Description   Final    URINE, CLEAN CATCH Performed at [facility], [place] [address] [place], [place] [address]    Special Requests   Final    NONE Performed at [facility], [place] [address], [place], [place] [address]    Culture MULTIPLE SPECIES PRESENT, SUGGEST RECOLLECTION (A)  Final   Report Status 03/08/2022 FINAL  Final  Blood Culture (routine x 2)     Status: None (Preliminary result)  Collection Time: 03/07/22 10:30 AM   Specimen: BLOOD  Result Value Ref Range Status   Specimen Description   Final    BLOOD RIGHT ANTECUBITAL Performed at [facility] [address], [place], [place] [address]    Special Requests   Final    BOTTLES DRAWN AEROBIC AND ANAEROBIC Blood Culture adequate volume Performed at [facility] [address], [place], [place] [address]    Culture   Final    NO GROWTH 3 DAYS Performed at [facility], [place] [address], [place], [place] [address]    Report Status PENDING  Incomplete  Blood Culture (routine x 2)     Status: None (Preliminary result)   Collection Time: 03/07/22 11:01 AM   Specimen: BLOOD  Result Value Ref Range Status   Specimen Description   Final    BLOOD LEFT ANTECUBITAL Performed at [facility] [address] [place], [place] [address]    Special Requests   Final    BOTTLES DRAWN AEROBIC AND ANAEROBIC Blood Culture adequate volume Performed at [facility] [address], [place], [place] [address]    Culture   Final    NO GROWTH 3 DAYS Performed at [facility], [place] [address], [place], [place] [address]    Report Status PENDING  Incomplete    Labs: CBC: Recent Labs  Lab 03/07/22 1025 03/08/22 0508 03/09/22 0452 03/10/22 0508  WBC 8.7 9.9 6.7 7.0  NEUTROABS 7.1  --   --   --   HGB 13.9 12.9* 12.1* 13.2  HCT 43.3 42.1 39.0 42.1  MCV 88.5 90.7 90.3 89.6  PLT 171 164 163 073   Basic Metabolic Panel: Recent Labs  Lab 03/07/22 1025 03/07/22 2152 03/08/22 0508 03/09/22 0452 03/10/22 0508  NA 139  --  139 142 140  K 4.3  --  4.1 3.7 3.9  CL 105  --  108 113* 109  CO2 25  --  '23 24 25  '$ GLUCOSE 126*  --  94 101* 110*  BUN 27*  --  '23 21 18  '$ CREATININE 1.15  --  1.23 1.10 0.87  CALCIUM 9.8  --  9.0 8.8* 9.0  MG  --   --   --  2.2  --   PHOS  --  1.7*  --   --   --    Liver Function Tests: Recent Labs  Lab 03/07/22 1025 03/08/22 0508 03/09/22 0452 03/10/22 0508  AST 73* 141* 93* 81*  ALT 23 34 34 46*  ALKPHOS 44 37* 35* 45  BILITOT 1.2 1.1 1.2 0.9  PROT 8.6* 7.1 6.7 7.0  ALBUMIN 4.0 3.2* 2.9* 3.0*   CBG: No results for input(s): "GLUCAP" in the last 168 hours.  Discharge time spent: greater than 30 minutes.  Signed: Raiford Noble, DO [facility] 03/10/2022

## 2022-03-11 DIAGNOSIS — J189 Pneumonia, unspecified organism: Secondary | ICD-10-CM | POA: Diagnosis not present

## 2022-03-11 DIAGNOSIS — Z86711 Personal history of pulmonary embolism: Secondary | ICD-10-CM | POA: Diagnosis not present

## 2022-03-11 DIAGNOSIS — E43 Unspecified severe protein-calorie malnutrition: Secondary | ICD-10-CM | POA: Diagnosis not present

## 2022-03-11 DIAGNOSIS — Z741 Need for assistance with personal care: Secondary | ICD-10-CM | POA: Diagnosis not present

## 2022-03-11 DIAGNOSIS — E860 Dehydration: Secondary | ICD-10-CM | POA: Diagnosis not present

## 2022-03-11 DIAGNOSIS — R278 Other lack of coordination: Secondary | ICD-10-CM | POA: Diagnosis not present

## 2022-03-11 DIAGNOSIS — Z8546 Personal history of malignant neoplasm of prostate: Secondary | ICD-10-CM | POA: Diagnosis not present

## 2022-03-11 DIAGNOSIS — R1312 Dysphagia, oropharyngeal phase: Secondary | ICD-10-CM | POA: Diagnosis not present

## 2022-03-11 DIAGNOSIS — R2681 Unsteadiness on feet: Secondary | ICD-10-CM | POA: Diagnosis not present

## 2022-03-11 DIAGNOSIS — I440 Atrioventricular block, first degree: Secondary | ICD-10-CM | POA: Diagnosis not present

## 2022-03-11 DIAGNOSIS — Z7401 Bed confinement status: Secondary | ICD-10-CM | POA: Diagnosis not present

## 2022-03-11 DIAGNOSIS — M2550 Pain in unspecified joint: Secondary | ICD-10-CM | POA: Diagnosis not present

## 2022-03-11 DIAGNOSIS — G9341 Metabolic encephalopathy: Secondary | ICD-10-CM | POA: Diagnosis not present

## 2022-03-11 DIAGNOSIS — C679 Malignant neoplasm of bladder, unspecified: Secondary | ICD-10-CM | POA: Diagnosis not present

## 2022-03-11 DIAGNOSIS — R531 Weakness: Secondary | ICD-10-CM | POA: Diagnosis not present

## 2022-03-11 DIAGNOSIS — E440 Moderate protein-calorie malnutrition: Secondary | ICD-10-CM

## 2022-03-11 DIAGNOSIS — M6281 Muscle weakness (generalized): Secondary | ICD-10-CM | POA: Diagnosis not present

## 2022-03-11 DIAGNOSIS — M6259 Muscle wasting and atrophy, not elsewhere classified, multiple sites: Secondary | ICD-10-CM | POA: Diagnosis not present

## 2022-03-11 NOTE — Care Management Important Message (Signed)
Important Message  Patient Details IM Letter placed in Patients room. Name: Connor Morris MRN: [id number] Date of Birth: 02-17-1934   Medicare Important Message Given:  Yes     Kerin Salen 03/11/2022, 9:47 AM

## 2022-03-11 NOTE — TOC Transition Note (Signed)
Transition of Care [facility]) - CM/SW Discharge Note   Patient Details  Name: Connor Morris MRN: [id number] Date of Birth: 1934-01-30  Transition of [facility]) CM/SW Contact:  Vassie Moselle, LCSW Phone Number: 03/11/2022, 11:33 AM   Clinical Narrative:    Pt is to discharge to [facility] room 107. Nurses to call report to [phone]. Pt will be transfer to facility by [facility]. Pt's daughter Trellis Vanoverbeke aware of transfer.   Final next level of care: Skilled Nursing Facility Barriers to Discharge: Barriers Resolved   Patient Goals and CMS Choice Patient states their goals for this hospitalization and ongoing recovery are:: To go home   Choice offered to / list presented to : Patient, Adult Children  Discharge Placement PASRR number recieved: 03/08/22            Patient chooses bed at: [facility] Patient to be transferred to facility by: [facility] Name of family member notified: Ediberto Sens Patient and family notified of of transfer: 03/11/22  Discharge Plan and Services In-house Referral: Clinical Social Work Discharge Planning Services: CM Consult Post Acute Care Choice: [facility]          DME Arranged: N/A DME Agency: NA                  Social Determinants of Health (SDOH) Interventions     Readmission Risk Interventions    03/10/2022    9:39 AM  Readmission Risk Prevention Plan  Post Dischage Appt Complete  Medication Screening Complete  Transportation Screening Complete

## 2022-03-11 NOTE — Discharge Summary (Addendum)
Physician Discharge Summary   Patient: Connor Morris MRN: [id number] DOB: 03-06-1934  Admit date:     03/07/2022  Discharge date: 03/11/22  Discharge Physician: Raiford Noble, DO   PCP: Velna Hatchet, MD   Recommendations at discharge:    Follow up with PCP within 1 to 2 weeks and repeat CBC,CMP,Mag,Phos within 1 week  Follow up with Urology Dr. Milford Cage and with Oncology within 1-2 weeks given recent PET Scan showing reoccurrence of prostate cancer the posterior prostate bed extending towards the left seminal vesicle without any other disease beyond the prostate at this time.  Discharge Diagnoses: Principal Problem:   PNA (pneumonia) Active Problems:   Severe protein-calorie malnutrition ([facility])   Dehydration   Generalized weakness   Failure to thrive in adult   Malnutrition of moderate degree   CAP (community acquired pneumonia)  Resolved Problems:   * No resolved hospital problems. *  Hospital Course: 88 y.o. male with medical history significant of bladder cancer, prostate cancer, DVT/PE on xarelto, thoracic aortic aneurysm. Presenting with weakness.  This is been worse over the last couple of days, he seems slower and weaker per family, and also slightly confused.  Family also noticed a cough and a runny nose.  He was found to have lobar pneumonia, placed on antibiotics and admitted to the hospital.  His pneumonia is improving and he is not requiring supplemental oxygen.  He is deemed medically stable to be discharged to SNF after PT OT evaluated.  ADDENDUM 03/11/22: Patient was deemed stable to D/C to SNF Yesterday but was not able to go due to bed availability at SNF. He remains medically stable and had no acute changes overnight. Stable to transfer to SNF today with close outpatient follow up.   Assessment and Plan: No notes have been filed under this hospital service. Service: Hospitalist  Right lower lobe pneumonia-has been placed on ceftriaxone and azithromycin, continue  while hospitalized and change to po.  Continue to closely monitor, respiratory status currently stable.  Cultures have remained negative.  As needed guaifenesin.  Remains on room air this morning and otherwise comfortable.  There was a concern about aspiration, speech evaluated and recommending Regular Diet with Thin Liquids -Repeat CXR in 3-6 weeks   Acute metabolic encephalopathy, in the setting of infectious process -Was confused the day before morning, discussed with daughter over the phone and she tells me that she suspects some degree of mild dementia.  Possibly that he is at baseline. -He is improved and will need Delirium Precautions at SNF  FTT, dehydration-encourage p.o. intake, RD consult.  Received IVF for the past 2 days, discontinue fluids the day before yesterday  -Dietary recommending Ensure High Plus Protein po BID and liberalizing diet    Elevated LFT-he denies any EtOH intake however pattern looks like that.  Improved the day before yesterday and now stable -AST went from 141 -> 81 and ALT went from 34 -> 46 -Continue to Monitor and Trend in the outpatient setting and repeat CMP within 1 week and if necessary RUQ U/S and Acute Hepatitis panel   Hypoalbuminemia  -Patient's Albumin Level went from 3.2 -> 2.9 -> 3.0 -Continue to Monitor and Trend in the outpatient setting and repeat CMP in 1 week    Moderate protein calorie malnutrition-RD consult -Nutrition Status: Nutrition Problem: Moderate Malnutrition Etiology: chronic illness (cancer) Signs/Symptoms: moderate fat depletion, severe muscle depletion Interventions: Liberalize Diet, Ensure Enlive (each supplement provides 350kcal and 20 grams of protein), MVI   History  of prostate and bladder cancer-followed as an outpatient.  Just had a PET scan which showed signs of prostate cancer recurrence in the posterior prostate bed extending towards the left seminal vesicle without any other disease beyond the prostate at this  time.  Following with Dr. Milford Cage as an outpatient, recommend ongoing outpatient follow-up  Thoracic Aortic Aneurysm -PET Scan done and showed "4.4 cm ascending thoracic aortic aneurysm. Could consider 1 year follow-up CT to assess for stability as warranted findings show caliber is within 1 mm of the area of dilation as compared to the study of August 06, 2020." -Follow up outpatient    Nutrition Documentation    [facility] ED to Hosp-Admission (Current) from 03/07/2022 in [facility]  Nutrition Problem Moderate Malnutrition  Etiology chronic illness  [cancer]  Nutrition Goal Patient will meet greater than or equal to 90% of their needs  Interventions Liberalize Diet, Ensure Enlive (each supplement provides 350kcal and 20 grams of protein), MVI      Consultants:None Procedures performed: None   Disposition: Skilled nursing facility Diet recommendation:  Discharge Diet Orders (From admission, onward)     Start     Ordered   03/10/22 0000  Diet general        03/10/22 1253           Regular diet DISCHARGE MEDICATION: Allergies as of 03/11/2022   No Known Allergies      Medication List     TAKE these medications    acetaminophen 325 MG tablet Commonly known as: TYLENOL Take 2 tablets (650 mg total) by mouth every 6 (six) hours as needed for mild pain (or Fever >/= 101).   azithromycin 500 MG tablet Commonly known as: ZITHROMAX Take 1 tablet (500 mg total) by mouth daily for 3 days.   cefdinir 300 MG capsule Commonly known as: OMNICEF Take 1 capsule (300 mg total) by mouth 2 (two) times daily for 5 days.   feeding supplement Liqd Take 237 mLs by mouth 2 (two) times daily between meals.   latanoprost 0.005 % ophthalmic solution Commonly known as: XALATAN Place 1 drop into the right eye at bedtime.   multivitamin with minerals tablet Take 1 tablet by mouth daily.   rivaroxaban 20 MG Tabs tablet Commonly known as:  XARELTO Take 20 mg by mouth daily with supper.        Contact information for after-discharge care     Destination     [facility] Preferred SNF .   Service: Skilled Nursing Contact information: [address] [place] [phone]                    Discharge Exam: Danley Danker Weights   03/08/22 1000  Weight: 66.5 kg   Vitals:   03/10/22 2016 03/11/22 0543  BP: (!) 135/55 (!) 139/55  Pulse: 67 (!) 57  Resp: 18 18  Temp: 98.9 F (37.2 C) 98.2 F (36.8 C)  SpO2: 98% 97%   Examination: Physical Exam:  Constitutional: Thin AAM in NAD Respiratory: Diminished to auscultation bilaterally, no wheezing, rales, rhonchi or crackles. Normal respiratory effort and patient is not tachypenic. No accessory muscle use. Unlabored breathing  Cardiovascular: RRR, no murmurs / rubs / gallops. S1 and S2 auscultated. No extremity edema Abdomen: Soft, non-tender, non-distended. Bowel sounds positive.  GU: Deferred. Musculoskeletal: No clubbing / cyanosis of digits/nails. No joint deformity upper and lower extremities.  Skin: No rashes,  lesions, ulcers on a limited skin evaluation. No induration; Warm and dry.  Neurologic: CN 2-12 grossly intact with no focal deficits. Romberg sign and cerebellar reflexes not assessed.  Psychiatric: Normal judgment and insight. Alert and oriented x 3. Normal mood and appropriate affect.   Condition at discharge: stable  The results of significant diagnostics from this hospitalization (including imaging, microbiology, ancillary and laboratory) are listed below for reference.   Imaging Studies: DG Chest Port 1 View  Result Date: 03/07/2022 CLINICAL DATA:  Questionable sepsis.  Evaluate for abnormality. EXAM: PORTABLE CHEST 1 VIEW COMPARISON:  01/30/2020 FINDINGS: Aortic atherosclerotic calcifications. Stable cardiomediastinal contours with aortic tortuosity. Lung volumes are low. New hazy opacity is  identified within the right lower. Left lung appears clear. No signs of pleural effusion or edema. IMPRESSION: New hazy opacity within the right lower lobe which may early pneumonia. Electronically Signed   By: Kerby Moors M.D.   On: 03/07/2022 10:43    Microbiology: Results for orders placed or performed during the hospital encounter of 03/07/22  Urine Culture     Status: Abnormal   Collection Time: 03/07/22  8:22 AM   Specimen: Urine, Clean Catch  Result Value Ref Range Status   Specimen Description   Final    URINE, CLEAN CATCH Performed at [facility], [place] [address], [place], [place] [address]    Special Requests   Final    NONE Performed at [facility], [place] [address], [place], [place] [address]    Culture MULTIPLE SPECIES PRESENT, SUGGEST RECOLLECTION (A)  Final   Report Status 03/08/2022 FINAL  Final  Blood Culture (routine x 2)     Status: None (Preliminary result)   Collection Time: 03/07/22 10:30 AM   Specimen: BLOOD  Result Value Ref Range Status   Specimen Description   Final    BLOOD RIGHT ANTECUBITAL Performed at [facility] [address] [place], [place] [address]    Special Requests   Final    BOTTLES DRAWN AEROBIC AND ANAEROBIC Blood Culture adequate volume Performed at [facility] [address] [place], [place] [address]    Culture   Final    NO GROWTH 3 DAYS Performed at [facility], [place] [address], [place], [place] [address]    Report Status PENDING  Incomplete  Blood Culture (routine x 2)     Status: None (Preliminary result)   Collection Time: 03/07/22 11:01 AM   Specimen: BLOOD  Result Value Ref Range Status   Specimen Description   Final    BLOOD LEFT ANTECUBITAL Performed at [facility] [address] [place], [place] [address]    Special Requests   Final    BOTTLES DRAWN AEROBIC AND ANAEROBIC Blood Culture adequate  volume Performed at [facility] [address] [place], [place] [address]    Culture   Final    NO GROWTH 3 DAYS Performed at [facility], [place] [address] [place], [place] [address]    Report Status PENDING  Incomplete    Labs: CBC: Recent Labs  Lab 03/07/22 1025 03/08/22 0508 03/09/22 0452 03/10/22 0508  WBC 8.7 9.9 6.7 7.0  NEUTROABS 7.1  --   --   --   HGB 13.9 12.9* 12.1* 13.2  HCT 43.3 42.1 39.0 42.1  MCV 88.5 90.7 90.3 89.6  PLT 171 164 163 546    Basic Metabolic Panel: Recent Labs  Lab 03/07/22  1025 03/07/22 2152 03/08/22 0508 03/09/22 0452 03/10/22 0508  NA 139  --  139 142 140  K 4.3  --  4.1 3.7 3.9  CL 105  --  108 113* 109  CO2 25  --  '23 24 25  '$ GLUCOSE 126*  --  94 101* 110*  BUN 27*  --  '23 21 18  '$ CREATININE 1.15  --  1.23 1.10 0.87  CALCIUM 9.8  --  9.0 8.8* 9.0  MG  --   --   --  2.2  --   PHOS  --  1.7*  --   --   --     Liver Function Tests: Recent Labs  Lab 03/07/22 1025 03/08/22 0508 03/09/22 0452 03/10/22 0508  AST 73* 141* 93* 81*  ALT 23 34 34 46*  ALKPHOS 44 37* 35* 45  BILITOT 1.2 1.1 1.2 0.9  PROT 8.6* 7.1 6.7 7.0  ALBUMIN 4.0 3.2* 2.9* 3.0*    CBG: No results for input(s): "GLUCAP" in the last 168 hours.  Discharge time spent: greater than 30 minutes.  Signed: Raiford Noble, DO [facility] 03/11/2022

## 2022-03-12 DIAGNOSIS — I440 Atrioventricular block, first degree: Secondary | ICD-10-CM | POA: Diagnosis not present

## 2022-03-12 DIAGNOSIS — C679 Malignant neoplasm of bladder, unspecified: Secondary | ICD-10-CM | POA: Diagnosis not present

## 2022-03-12 DIAGNOSIS — G9341 Metabolic encephalopathy: Secondary | ICD-10-CM | POA: Diagnosis not present

## 2022-03-12 DIAGNOSIS — Z86711 Personal history of pulmonary embolism: Secondary | ICD-10-CM | POA: Diagnosis not present

## 2022-03-12 LAB — CULTURE, BLOOD (ROUTINE X 2)
Culture: NO GROWTH
Culture: NO GROWTH
Special Requests: ADEQUATE
Special Requests: ADEQUATE

## 2022-03-16 DIAGNOSIS — Z86711 Personal history of pulmonary embolism: Secondary | ICD-10-CM | POA: Diagnosis not present

## 2022-03-16 DIAGNOSIS — C679 Malignant neoplasm of bladder, unspecified: Secondary | ICD-10-CM | POA: Diagnosis not present

## 2022-03-16 DIAGNOSIS — I440 Atrioventricular block, first degree: Secondary | ICD-10-CM | POA: Diagnosis not present

## 2022-03-16 DIAGNOSIS — G9341 Metabolic encephalopathy: Secondary | ICD-10-CM | POA: Diagnosis not present

## 2022-03-17 DIAGNOSIS — R2681 Unsteadiness on feet: Secondary | ICD-10-CM | POA: Diagnosis not present

## 2022-03-17 DIAGNOSIS — Z8546 Personal history of malignant neoplasm of prostate: Secondary | ICD-10-CM | POA: Diagnosis not present

## 2022-03-17 DIAGNOSIS — J189 Pneumonia, unspecified organism: Secondary | ICD-10-CM | POA: Diagnosis not present

## 2022-03-17 DIAGNOSIS — M6281 Muscle weakness (generalized): Secondary | ICD-10-CM | POA: Diagnosis not present

## 2022-03-17 DIAGNOSIS — E43 Unspecified severe protein-calorie malnutrition: Secondary | ICD-10-CM | POA: Diagnosis not present

## 2022-03-17 DIAGNOSIS — G9341 Metabolic encephalopathy: Secondary | ICD-10-CM | POA: Diagnosis not present

## 2022-03-17 DIAGNOSIS — E860 Dehydration: Secondary | ICD-10-CM | POA: Diagnosis not present

## 2022-03-17 DIAGNOSIS — I440 Atrioventricular block, first degree: Secondary | ICD-10-CM | POA: Diagnosis not present

## 2022-03-24 DIAGNOSIS — J189 Pneumonia, unspecified organism: Secondary | ICD-10-CM | POA: Diagnosis not present

## 2022-03-24 DIAGNOSIS — E860 Dehydration: Secondary | ICD-10-CM | POA: Diagnosis not present

## 2022-03-24 DIAGNOSIS — R2681 Unsteadiness on feet: Secondary | ICD-10-CM | POA: Diagnosis not present

## 2022-03-24 DIAGNOSIS — E43 Unspecified severe protein-calorie malnutrition: Secondary | ICD-10-CM | POA: Diagnosis not present

## 2022-03-24 DIAGNOSIS — Z8546 Personal history of malignant neoplasm of prostate: Secondary | ICD-10-CM | POA: Diagnosis not present

## 2022-03-24 DIAGNOSIS — I440 Atrioventricular block, first degree: Secondary | ICD-10-CM | POA: Diagnosis not present

## 2022-03-24 DIAGNOSIS — M6281 Muscle weakness (generalized): Secondary | ICD-10-CM | POA: Diagnosis not present

## 2022-03-24 DIAGNOSIS — G9341 Metabolic encephalopathy: Secondary | ICD-10-CM | POA: Diagnosis not present

## 2022-03-25 DIAGNOSIS — I440 Atrioventricular block, first degree: Secondary | ICD-10-CM | POA: Diagnosis not present

## 2022-03-25 DIAGNOSIS — Z86711 Personal history of pulmonary embolism: Secondary | ICD-10-CM | POA: Diagnosis not present

## 2022-03-25 DIAGNOSIS — G9341 Metabolic encephalopathy: Secondary | ICD-10-CM | POA: Diagnosis not present

## 2022-03-25 DIAGNOSIS — C679 Malignant neoplasm of bladder, unspecified: Secondary | ICD-10-CM | POA: Diagnosis not present

## 2022-03-26 ENCOUNTER — Encounter (HOSPITAL_COMMUNITY): Payer: Self-pay

## 2022-03-26 ENCOUNTER — Emergency Department (HOSPITAL_COMMUNITY): Payer: Medicare HMO

## 2022-03-26 ENCOUNTER — Inpatient Hospital Stay (HOSPITAL_COMMUNITY)
Admission: EM | Admit: 2022-03-26 | Discharge: 2022-03-30 | DRG: 689 | Disposition: A | Discharge status: Skilled Nursing Facility | Payer: Medicare HMO | Attending: Internal Medicine | Admitting: Internal Medicine

## 2022-03-26 DIAGNOSIS — Z86711 Personal history of pulmonary embolism: Secondary | ICD-10-CM

## 2022-03-26 DIAGNOSIS — Z7901 Long term (current) use of anticoagulants: Secondary | ICD-10-CM | POA: Diagnosis not present

## 2022-03-26 DIAGNOSIS — I714 Abdominal aortic aneurysm, without rupture, unspecified: Secondary | ICD-10-CM | POA: Diagnosis present

## 2022-03-26 DIAGNOSIS — Z86718 Personal history of other venous thrombosis and embolism: Secondary | ICD-10-CM | POA: Diagnosis not present

## 2022-03-26 DIAGNOSIS — Z87891 Personal history of nicotine dependence: Secondary | ICD-10-CM

## 2022-03-26 DIAGNOSIS — Z6821 Body mass index (BMI) 21.0-21.9, adult: Secondary | ICD-10-CM | POA: Diagnosis not present

## 2022-03-26 DIAGNOSIS — R531 Weakness: Secondary | ICD-10-CM | POA: Diagnosis not present

## 2022-03-26 DIAGNOSIS — Z833 Family history of diabetes mellitus: Secondary | ICD-10-CM

## 2022-03-26 DIAGNOSIS — R4182 Altered mental status, unspecified: Secondary | ICD-10-CM | POA: Diagnosis not present

## 2022-03-26 DIAGNOSIS — B952 Enterococcus as the cause of diseases classified elsewhere: Secondary | ICD-10-CM | POA: Diagnosis present

## 2022-03-26 DIAGNOSIS — E440 Moderate protein-calorie malnutrition: Secondary | ICD-10-CM | POA: Diagnosis present

## 2022-03-26 DIAGNOSIS — I712 Thoracic aortic aneurysm, without rupture, unspecified: Secondary | ICD-10-CM | POA: Diagnosis not present

## 2022-03-26 DIAGNOSIS — R2681 Unsteadiness on feet: Secondary | ICD-10-CM | POA: Diagnosis not present

## 2022-03-26 DIAGNOSIS — E46 Unspecified protein-calorie malnutrition: Secondary | ICD-10-CM | POA: Diagnosis present

## 2022-03-26 DIAGNOSIS — N390 Urinary tract infection, site not specified: Principal | ICD-10-CM | POA: Diagnosis present

## 2022-03-26 DIAGNOSIS — M6281 Muscle weakness (generalized): Secondary | ICD-10-CM | POA: Diagnosis not present

## 2022-03-26 DIAGNOSIS — R001 Bradycardia, unspecified: Secondary | ICD-10-CM | POA: Diagnosis not present

## 2022-03-26 DIAGNOSIS — J439 Emphysema, unspecified: Secondary | ICD-10-CM | POA: Diagnosis present

## 2022-03-26 DIAGNOSIS — Z8249 Family history of ischemic heart disease and other diseases of the circulatory system: Secondary | ICD-10-CM

## 2022-03-26 DIAGNOSIS — I440 Atrioventricular block, first degree: Secondary | ICD-10-CM | POA: Diagnosis present

## 2022-03-26 DIAGNOSIS — Z8551 Personal history of malignant neoplasm of bladder: Secondary | ICD-10-CM

## 2022-03-26 DIAGNOSIS — F0390 Unspecified dementia without behavioral disturbance: Secondary | ICD-10-CM | POA: Diagnosis not present

## 2022-03-26 DIAGNOSIS — E869 Volume depletion, unspecified: Secondary | ICD-10-CM | POA: Diagnosis present

## 2022-03-26 DIAGNOSIS — Z66 Do not resuscitate: Secondary | ICD-10-CM | POA: Diagnosis not present

## 2022-03-26 DIAGNOSIS — Z741 Need for assistance with personal care: Secondary | ICD-10-CM | POA: Diagnosis not present

## 2022-03-26 DIAGNOSIS — J9811 Atelectasis: Secondary | ICD-10-CM | POA: Diagnosis not present

## 2022-03-26 DIAGNOSIS — E43 Unspecified severe protein-calorie malnutrition: Secondary | ICD-10-CM | POA: Diagnosis not present

## 2022-03-26 DIAGNOSIS — Z8701 Personal history of pneumonia (recurrent): Secondary | ICD-10-CM

## 2022-03-26 DIAGNOSIS — R0902 Hypoxemia: Secondary | ICD-10-CM | POA: Diagnosis not present

## 2022-03-26 DIAGNOSIS — R404 Transient alteration of awareness: Secondary | ICD-10-CM | POA: Diagnosis not present

## 2022-03-26 DIAGNOSIS — G9341 Metabolic encephalopathy: Secondary | ICD-10-CM | POA: Diagnosis not present

## 2022-03-26 DIAGNOSIS — N179 Acute kidney failure, unspecified: Secondary | ICD-10-CM | POA: Diagnosis present

## 2022-03-26 DIAGNOSIS — M6259 Muscle wasting and atrophy, not elsewhere classified, multiple sites: Secondary | ICD-10-CM | POA: Diagnosis not present

## 2022-03-26 DIAGNOSIS — Z8546 Personal history of malignant neoplasm of prostate: Secondary | ICD-10-CM

## 2022-03-26 DIAGNOSIS — R54 Age-related physical debility: Secondary | ICD-10-CM | POA: Diagnosis present

## 2022-03-26 DIAGNOSIS — R000 Tachycardia, unspecified: Secondary | ICD-10-CM | POA: Diagnosis not present

## 2022-03-26 DIAGNOSIS — R41841 Cognitive communication deficit: Secondary | ICD-10-CM | POA: Diagnosis not present

## 2022-03-26 DIAGNOSIS — E860 Dehydration: Secondary | ICD-10-CM | POA: Diagnosis not present

## 2022-03-26 DIAGNOSIS — I959 Hypotension, unspecified: Secondary | ICD-10-CM | POA: Diagnosis present

## 2022-03-26 DIAGNOSIS — Z7401 Bed confinement status: Secondary | ICD-10-CM | POA: Diagnosis not present

## 2022-03-26 HISTORY — DX: Malignant (primary) neoplasm, unspecified: C80.1

## 2022-03-26 LAB — COMPREHENSIVE METABOLIC PANEL
ALT: 23 U/L (ref 0–44)
AST: 20 U/L (ref 15–41)
Albumin: 3.6 g/dL (ref 3.5–5.0)
Alkaline Phosphatase: 54 U/L (ref 38–126)
Anion gap: 9 (ref 5–15)
BUN: 19 mg/dL (ref 8–23)
CO2: 23 mmol/L (ref 22–32)
Calcium: 9.1 mg/dL (ref 8.9–10.3)
Chloride: 105 mmol/L (ref 98–111)
Creatinine, Ser: 1.51 mg/dL — ABNORMAL HIGH (ref 0.61–1.24)
GFR, Estimated: 45 mL/min — ABNORMAL LOW (ref >60)
Glucose, Bld: 116 mg/dL — ABNORMAL HIGH (ref 70–99)
Potassium: 4.2 mmol/L (ref 3.5–5.1)
Sodium: 137 mmol/L (ref 135–145)
Total Bilirubin: 1 mg/dL (ref 0.3–1.2)
Total Protein: 7.6 g/dL (ref 6.5–8.1)

## 2022-03-26 LAB — URINALYSIS, ROUTINE W REFLEX MICROSCOPIC
Bilirubin Urine: NEGATIVE
Glucose, UA: NEGATIVE mg/dL
Hgb urine dipstick: NEGATIVE
Ketones, ur: NEGATIVE mg/dL
Nitrite: NEGATIVE
Protein, ur: NEGATIVE mg/dL
Specific Gravity, Urine: 1.009 (ref 1.005–1.030)
WBC, UA: >50 WBC/hpf — ABNORMAL HIGH (ref 0–5)
pH: 6 (ref 5.0–8.0)

## 2022-03-26 LAB — CBC WITH DIFFERENTIAL/PLATELET
Abs Immature Granulocytes: 0.04 10*3/uL (ref 0.00–0.07)
Basophils Absolute: 0 10*3/uL (ref 0.0–0.1)
Basophils Relative: 0 %
Eosinophils Absolute: 0 10*3/uL (ref 0.0–0.5)
Eosinophils Relative: 0 %
HCT: 37.5 % — ABNORMAL LOW (ref 39.0–52.0)
Hemoglobin: 12 g/dL — ABNORMAL LOW (ref 13.0–17.0)
Immature Granulocytes: 0 %
Lymphocytes Relative: 11 %
Lymphs Abs: 1 10*3/uL (ref 0.7–4.0)
MCH: 28.8 pg (ref 26.0–34.0)
MCHC: 32 g/dL (ref 30.0–36.0)
MCV: 89.9 fL (ref 80.0–100.0)
Monocytes Absolute: 0.9 10*3/uL (ref 0.1–1.0)
Monocytes Relative: 10 %
Neutro Abs: 7.5 10*3/uL (ref 1.7–7.7)
Neutrophils Relative %: 79 %
Platelets: 254 10*3/uL (ref 150–400)
RBC: 4.17 MIL/uL — ABNORMAL LOW (ref 4.22–5.81)
RDW: 13.5 % (ref 11.5–15.5)
WBC: 9.6 10*3/uL (ref 4.0–10.5)
nRBC: 0 % (ref 0.0–0.2)

## 2022-03-26 LAB — TROPONIN I (HIGH SENSITIVITY)
Troponin I (High Sensitivity): 11 ng/L (ref <18)
Troponin I (High Sensitivity): 9 ng/L (ref <18)

## 2022-03-26 LAB — LACTIC ACID, PLASMA
Lactic Acid, Venous: 1.7 mmol/L (ref 0.5–1.9)
Lactic Acid, Venous: 1 mmol/L (ref 0.5–1.9)

## 2022-03-26 MED ORDER — SODIUM CHLORIDE 0.9 % IV BOLUS
500.0000 mL | Freq: Once | INTRAVENOUS | Status: AC
Start: 2022-03-26 — End: 2022-03-26
  Administered 2022-03-26: 500 mL via INTRAVENOUS

## 2022-03-26 MED ORDER — SODIUM CHLORIDE 0.9 % IV BOLUS
500.0000 mL | Freq: Once | INTRAVENOUS | Status: AC
  Administered 2022-03-26: 500 mL via INTRAVENOUS

## 2022-03-26 MED ORDER — SODIUM CHLORIDE 0.9 % IV SOLN
1.0000 g | Freq: Once | INTRAVENOUS | Status: AC
  Administered 2022-03-26: 1 g via INTRAVENOUS
  Filled 2022-03-26: qty 10

## 2022-03-26 NOTE — ED Notes (Signed)
Ambulated pt per MD request; pt was able to walk to door of room but required heavy 2+ assist and exhibited extremely unsteady gait. Per pt, he lives alone and has a walker but is not sure where it is.   BP 197/82.  Notified provider of pt ambulation trial and elevated BP, awaiting orders.

## 2022-03-26 NOTE — ED Triage Notes (Signed)
Per EMS- Patient was discharged from [facility]  yesterday. Patient called EMS  for c/o weakness,  Initial BP 60/30. Patient was given 500 ml NS prior to arrival and BP increased to 110/70.

## 2022-03-26 NOTE — ED Provider Notes (Signed)
[place] DEPT Provider Note   CSN: [id number] Arrival date & time: 03/26/22  1607     History  Chief Complaint  Patient presents with   Hypotension   Weakness    Connor Morris is a 88 y.o. male.  Patient is a 88 year old male who presents with weakness.  He has a history of bladder and prostate cancer, DVT/PE on Xarelto, dementia.  He was recently admitted to the hospital from July 23 to July 27 for pneumonia, failure to thrive.  He completed a course of antibiotics.  He was discharged to [facility] facility.  He went home yesterday.  Daughter is at bedside who states that he seemed to be okay yesterday when they picked him up.  He was ambulatory.  Today he seemed to be weaker than normal.  He was complaining of some dizziness.  He had some complaints of pain all over.  He was noted to be hypotensive on EMS arrival with a blood pressure of 60/30.  He received 500 cc of normal saline and his BP increased to 110/70.  Daughter says he is at his baseline mental status which means that he has some confusion.  He has not been able to ambulate today.  He has not been eating or drinking today.  No vomiting or diarrhea.  No known fevers.  No recent falls.       Home Medications Prior to Admission medications   Medication Sig Start Date End Date Taking? Authorizing Provider  acetaminophen (TYLENOL) 325 MG tablet Take 2 tablets (650 mg total) by mouth every 6 (six) hours as needed for mild pain (or Fever >/= 101). 03/10/22  Yes Sheikh, Omair Latif, DO  feeding supplement (ENSURE ENLIVE / ENSURE PLUS) LIQD Take 237 mLs by mouth 2 (two) times daily between meals. 03/10/22  Yes Sheikh, Omair Latif, DO  Multiple Vitamins-Minerals (MULTIVITAMIN WITH MINERALS) tablet Take 1 tablet by mouth daily.    Yes [provider]  rivaroxaban (XARELTO) 20 MG TABS tablet Take 20 mg by mouth in the morning.   Yes [provider]  latanoprost (XALATAN) 0.005 %  ophthalmic solution Place 1 drop into the right eye at bedtime. 09/28/21   [provider]      Allergies    Patient has no known allergies.    Review of Systems   Review of Systems  Unable to perform ROS: Dementia    Physical Exam Updated Vital Signs BP (!) 197/82   Pulse 78   Temp 97.9 F (36.6 C) (Oral)   Resp 16   Ht '5\' 10"'$  (1.778 m)   Wt 66.5 kg   SpO2 100%   BMI 21.04 kg/m  Physical Exam Constitutional:      Appearance: He is well-developed.  HENT:     Head: Normocephalic and atraumatic.  Eyes:     Pupils: Pupils are equal, round, and reactive to light.  Cardiovascular:     Rate and Rhythm: Normal rate and regular rhythm.     Heart sounds: Normal heart sounds.  Pulmonary:     Effort: Pulmonary effort is normal. No respiratory distress.     Breath sounds: Normal breath sounds. No wheezing or rales.  Chest:     Chest wall: No tenderness.  Abdominal:     General: Bowel sounds are normal.     Palpations: Abdomen is soft.     Tenderness: There is no abdominal tenderness. There is no guarding or rebound.  Musculoskeletal:  General: Normal range of motion.     Cervical back: Normal range of motion and neck supple.  Lymphadenopathy:     Cervical: No cervical adenopathy.  Skin:    General: Skin is warm and dry.     Findings: No rash.  Neurological:     General: No focal deficit present.     Mental Status: He is alert.     Comments: Oriented to person only, moves all extremities took without focal deficits     ED Results / Procedures / Treatments   Labs (all labs ordered are listed, but only abnormal results are displayed) Labs Reviewed  COMPREHENSIVE METABOLIC PANEL - Abnormal; Notable for the following components:      Result Value   Glucose, Bld 116 (*)    Creatinine, Ser 1.51 (*)    GFR, Estimated 45 (*)    All other components within normal limits  CBC WITH DIFFERENTIAL/PLATELET - Abnormal; Notable for the following components:   RBC  4.17 (*)    Hemoglobin 12.0 (*)    HCT 37.5 (*)    All other components within normal limits  URINALYSIS, ROUTINE W REFLEX MICROSCOPIC - Abnormal; Notable for the following components:   APPearance HAZY (*)    Leukocytes,Ua LARGE (*)    WBC, UA >50 (*)    Bacteria, UA RARE (*)    All other components within normal limits  URINE CULTURE  LACTIC ACID, PLASMA  LACTIC ACID, PLASMA  TROPONIN I (HIGH SENSITIVITY)  TROPONIN I (HIGH SENSITIVITY)    EKG EKG Interpretation  Date/Time:  Friday March 26 2022 17:41:08 EDT Ventricular Rate:  59 PR Interval:  217 QRS Duration: 92 QT Interval:  446 QTC Calculation: 442 R Axis:   -41 Text Interpretation: Sinus rhythm Borderline prolonged PR interval Left anterior fascicular block Abnormal R-wave progression, early transition Consider anterior infarct Confirmed by Malvin Johns [phone]) on 03/26/2022 5:57:47 PM  Radiology DG Chest 2 View  Result Date: 03/26/2022 CLINICAL DATA:  Weakness EXAM: CHEST - 2 VIEW COMPARISON:  Chest x-ray 03/07/2022 FINDINGS: The heart size and mediastinal contours are within normal limits. There is minimal bibasilar atelectasis. There is no pleural effusion or pneumothorax. No acute fractures are seen. Stable compression deformity of approximate L1. IMPRESSION: No active cardiopulmonary disease. Electronically Signed   By: Ronney Asters M.D.   On: 03/26/2022 18:46   CT Head Wo Contrast  Result Date: 03/26/2022 CLINICAL DATA:  Altered mental status EXAM: CT HEAD WITHOUT CONTRAST TECHNIQUE: Contiguous axial images were obtained from the base of the skull through the vertex without intravenous contrast. RADIATION DOSE REDUCTION: This exam was performed according to the departmental dose-optimization program which includes automated exposure control, adjustment of the mA and/or kV according to patient size and/or use of iterative reconstruction technique. COMPARISON:  01/30/2020 FINDINGS: Brain: No acute intracranial  findings are seen. There are no signs of bleeding within the cranium. Cortical sulci are prominent. There is decreased density in periventricular white matter. Vascular: Unremarkable. Skull: Unremarkable. Sinuses/Orbits: There is mucosal thickening in the ethmoid and maxillary sinuses. Other: None. IMPRESSION: No acute intracranial findings are seen in noncontrast CT brain. Atrophy. Small-vessel disease. Mild chronic sinusitis. Electronically Signed   By: Elmer Picker M.D.   On: 03/26/2022 18:41    Procedures Procedures    Medications Ordered in ED Medications  sodium chloride 0.9 % bolus 500 mL (0 mLs Intravenous Stopped 03/26/22 1958)  sodium chloride 0.9 % bolus 500 mL (0 mLs Intravenous Stopped 03/26/22 2130)  cefTRIAXone (ROCEPHIN) 1 g in sodium chloride 0.9 % 100 mL IVPB (0 g Intravenous Stopped 03/26/22 2234)    ED Course/ Medical Decision Making/ A&P                           Medical Decision Making Amount and/or Complexity of Data Reviewed Labs: ordered. Radiology: ordered.  Risk Decision regarding hospitalization.   Patient is a 88 year old male who presents with weakness.  He had a recent admission for pneumonia and failure to thrive.  He was discharged from rehab center yesterday.  Per the daughter, he was able to ambulate unassisted yesterday and today has not been able to get out of bed.  He is afebrile here.  He was hypotensive on EMS arrival but this apparently responded to fluids.  He has not had any episodes of hypotension here.  He was given further IV fluids here.  His labs are overall nonconcerning.  His creatinine was mildly elevated as compared to prior values.  His urine does show suggestions of infection.  It was sent for culture.  He was started on IV Rocephin.  He does not have any suggestions of sepsis based on his WBC count, lactate and other vital signs.  He is afebrile.  His head CT shows no acute abnormality.  Chest x-ray was interpreted by me and  confirmed by the radiologist show no evidence of pneumonia or other acute abnormality.  Initially the plan was to discharge patient on antibiotics.  However we attempted to get the patient up and he is not able to stand unassisted.  He requires a two-person assist just to stand.  Given this, I feel that he would benefit from inpatient treatment.  I spoke with Dr. Cyd Silence who will admit the patient for further treatment.  Final Clinical Impression(s) / ED Diagnoses Final diagnoses:  Lower urinary tract infectious disease  Generalized weakness    Rx / DC Orders ED Discharge Orders     None         Malvin Johns, MD 03/26/22 2251

## 2022-03-27 ENCOUNTER — Other Ambulatory Visit: Payer: Self-pay

## 2022-03-27 ENCOUNTER — Encounter (HOSPITAL_COMMUNITY): Payer: Self-pay | Admitting: Internal Medicine

## 2022-03-27 DIAGNOSIS — J439 Emphysema, unspecified: Secondary | ICD-10-CM | POA: Diagnosis present

## 2022-03-27 DIAGNOSIS — E46 Unspecified protein-calorie malnutrition: Secondary | ICD-10-CM

## 2022-03-27 DIAGNOSIS — I714 Abdominal aortic aneurysm, without rupture, unspecified: Secondary | ICD-10-CM | POA: Diagnosis present

## 2022-03-27 DIAGNOSIS — Z7901 Long term (current) use of anticoagulants: Secondary | ICD-10-CM

## 2022-03-27 DIAGNOSIS — R54 Age-related physical debility: Secondary | ICD-10-CM | POA: Diagnosis present

## 2022-03-27 DIAGNOSIS — G9341 Metabolic encephalopathy: Secondary | ICD-10-CM | POA: Diagnosis present

## 2022-03-27 DIAGNOSIS — Z8249 Family history of ischemic heart disease and other diseases of the circulatory system: Secondary | ICD-10-CM | POA: Diagnosis not present

## 2022-03-27 DIAGNOSIS — N179 Acute kidney failure, unspecified: Secondary | ICD-10-CM

## 2022-03-27 DIAGNOSIS — Z86718 Personal history of other venous thrombosis and embolism: Secondary | ICD-10-CM | POA: Diagnosis not present

## 2022-03-27 DIAGNOSIS — N390 Urinary tract infection, site not specified: Secondary | ICD-10-CM | POA: Diagnosis present

## 2022-03-27 DIAGNOSIS — E440 Moderate protein-calorie malnutrition: Secondary | ICD-10-CM | POA: Diagnosis present

## 2022-03-27 DIAGNOSIS — Z87891 Personal history of nicotine dependence: Secondary | ICD-10-CM | POA: Diagnosis not present

## 2022-03-27 DIAGNOSIS — R4182 Altered mental status, unspecified: Secondary | ICD-10-CM | POA: Diagnosis present

## 2022-03-27 DIAGNOSIS — Z8551 Personal history of malignant neoplasm of bladder: Secondary | ICD-10-CM | POA: Diagnosis not present

## 2022-03-27 DIAGNOSIS — Z8546 Personal history of malignant neoplasm of prostate: Secondary | ICD-10-CM | POA: Diagnosis not present

## 2022-03-27 DIAGNOSIS — I712 Thoracic aortic aneurysm, without rupture, unspecified: Secondary | ICD-10-CM | POA: Diagnosis present

## 2022-03-27 DIAGNOSIS — Z86711 Personal history of pulmonary embolism: Secondary | ICD-10-CM | POA: Diagnosis not present

## 2022-03-27 DIAGNOSIS — Z6821 Body mass index (BMI) 21.0-21.9, adult: Secondary | ICD-10-CM | POA: Diagnosis not present

## 2022-03-27 DIAGNOSIS — F0390 Unspecified dementia without behavioral disturbance: Secondary | ICD-10-CM | POA: Diagnosis present

## 2022-03-27 DIAGNOSIS — Z8701 Personal history of pneumonia (recurrent): Secondary | ICD-10-CM | POA: Diagnosis not present

## 2022-03-27 DIAGNOSIS — Z833 Family history of diabetes mellitus: Secondary | ICD-10-CM | POA: Diagnosis not present

## 2022-03-27 DIAGNOSIS — E869 Volume depletion, unspecified: Secondary | ICD-10-CM | POA: Diagnosis present

## 2022-03-27 DIAGNOSIS — Z66 Do not resuscitate: Secondary | ICD-10-CM | POA: Diagnosis present

## 2022-03-27 DIAGNOSIS — B952 Enterococcus as the cause of diseases classified elsewhere: Secondary | ICD-10-CM | POA: Diagnosis present

## 2022-03-27 DIAGNOSIS — I959 Hypotension, unspecified: Secondary | ICD-10-CM | POA: Diagnosis present

## 2022-03-27 DIAGNOSIS — I440 Atrioventricular block, first degree: Secondary | ICD-10-CM | POA: Diagnosis present

## 2022-03-27 LAB — AMMONIA: Ammonia: 18 umol/L (ref 9–35)

## 2022-03-27 LAB — TSH: TSH: 1.253 u[IU]/mL (ref 0.350–4.500)

## 2022-03-27 LAB — MRSA NEXT GEN BY PCR, NASAL: MRSA by PCR Next Gen: NOT DETECTED

## 2022-03-27 LAB — FOLATE: Folate: 17.2 ng/mL (ref >5.9)

## 2022-03-27 LAB — VITAMIN B12: Vitamin B-12: 522 pg/mL (ref 180–914)

## 2022-03-27 MED ORDER — POLYETHYLENE GLYCOL 3350 17 G PO PACK: 17.0000 g | PACK | Freq: Every day | ORAL | Status: DC | PRN

## 2022-03-27 MED ORDER — SODIUM CHLORIDE 0.9 % IV SOLN: INTRAVENOUS | Status: AC

## 2022-03-27 MED ORDER — ENSURE ENLIVE PO LIQD
237.0000 mL | Freq: Two times a day (BID) | ORAL | Status: DC
  Administered 2022-03-27 – 2022-03-30 (×8): 237 mL via ORAL

## 2022-03-27 MED ORDER — RIVAROXABAN 20 MG PO TABS
20.0000 mg | ORAL_TABLET | Freq: Every day | ORAL | Status: DC
  Administered 2022-03-27 – 2022-03-29 (×3): 20 mg via ORAL
  Filled 2022-03-27 (×3): qty 1

## 2022-03-27 MED ORDER — ACETAMINOPHEN 325 MG PO TABS
650.0000 mg | ORAL_TABLET | Freq: Four times a day (QID) | ORAL | Status: DC | PRN
  Administered 2022-03-28: 650 mg via ORAL
  Filled 2022-03-27: qty 2

## 2022-03-27 MED ORDER — ONDANSETRON HCL 4 MG/2ML IJ SOLN: 4.0000 mg | Freq: Four times a day (QID) | INTRAMUSCULAR | Status: DC | PRN

## 2022-03-27 MED ORDER — ONDANSETRON HCL 4 MG PO TABS: 4.0000 mg | ORAL_TABLET | Freq: Four times a day (QID) | ORAL | Status: DC | PRN

## 2022-03-27 MED ORDER — ACETAMINOPHEN 650 MG RE SUPP: 650.0000 mg | Freq: Four times a day (QID) | RECTAL | Status: DC | PRN

## 2022-03-27 MED ORDER — LATANOPROST 0.005 % OP SOLN
1.0000 [drp] | Freq: Every day | OPHTHALMIC | Status: DC
Start: 2022-03-27 — End: 2022-03-27

## 2022-03-27 MED ORDER — LACTATED RINGERS IV SOLN: INTRAVENOUS | Status: DC

## 2022-03-27 MED ORDER — HYDRALAZINE HCL 20 MG/ML IJ SOLN: 10.0000 mg | Freq: Four times a day (QID) | INTRAMUSCULAR | Status: DC | PRN

## 2022-03-27 MED ORDER — SODIUM CHLORIDE 0.9 % IV SOLN
1.0000 g | INTRAVENOUS | Status: DC
  Administered 2022-03-27 – 2022-03-28 (×2): 1 g via INTRAVENOUS
  Filled 2022-03-27 (×2): qty 10

## 2022-03-27 NOTE — Assessment & Plan Note (Signed)
   History of DVT/PE in 2019  Continue home regimen of Xarelto

## 2022-03-27 NOTE — Assessment & Plan Note (Signed)
   Suspect some degree of underlying dementia that has yet to be formally diagnosed Minimizing uncomfortable stimuli Minimizing mood altering and sedating agents Encouraging family to remain at bedside is much as possible Frequent redirection by staff Fall precautions

## 2022-03-27 NOTE — Plan of Care (Signed)
  Problem: Pain Managment: Goal: General experience of comfort will improve Outcome: Progressing   

## 2022-03-27 NOTE — Assessment & Plan Note (Signed)
   Acute kidney injury likely secondary to volume depletion in the setting of urinary tract infection  Hydrating patient with intravenous isotonic fluids  Strict input and output monitoring  Monitor renal function and electrolytes with serial chemistries  Obtaining bladder scan to ensure there is no evidence of postobstructive uropathy  If renal function fails to rapidly improve will expand work-up

## 2022-03-27 NOTE — Assessment & Plan Note (Signed)
   Urinalysis suggestive of urinary tract infection  Underlying UTI may be the cause of the patient's sudden onset lethargy/confusion weakness and poor oral intake  Patient has been placed on intravenous ceftriaxone  Additionally hydrating with intravenous isotonic fluids  Blood and urine cultures have been obtained

## 2022-03-27 NOTE — Assessment & Plan Note (Signed)
   Continue nutritional supplements per nutrition recommendations during last hospitalization  Nutrition consultation order placed, their continued recommendations are appreciated.

## 2022-03-27 NOTE — H&P (Signed)
History and Physical    Patient: Connor Morris MRN: [id number] DOA: 03/26/2022  Date of Service: the patient was seen and examined on 03/27/2022  Patient coming from: Home  Chief Complaint:  Chief Complaint  Patient presents with   Hypotension   Weakness    HPI:   88 year old male with past medical history of bladder cancer, prostate cancer (2002 S/P radioactive seed implants, recurrent disease identified via PET CT 01/2022, follows with Dr. Milford Cage), DVT/PE (Dx'ed 03/2018) on Xarelto, thoracic aortic aneurysm (4.6cm 07/2020)  and protein calorie malnutrition.  Of note, patient was recently hospitalized at [facility] for severe generalized weakness secondary to pneumonia from 7/23 until 7/27 with presentation complicated by acute metabolic encephalopathy.  Patient was treated with intravenous antibiotics and was eventually transition to oral cefdinir and azithromycin.   patient was discharged to [facility] living and rehab for physical therapy on 7/27.  Daughter explains that since discharge from physical therapy on 8/10 patient has exhibited a rapid decline at home.  Patient has become progressively more lethargic with intermittent bouts of confusion.  This has been associated with poor oral intake and worsening weakness.  Weakness has become so severe that the family is unable to get the patient ambulate even with assistance.  Upon further questioning daughter denies any cough, shortness of breath, complains of pain, diarrhea or fevers with the patient.  Due to patient's rapidly progressive symptoms EMS was contacted.  Upon prompt EMS arrival patient was found to be hypotensive with systolic blood pressures in the 50s.  Patient was bolused with intravenous isotonic fluids and was promptly brought into [facility] emergency department for evaluation.    Upon evaluation in the emergency department urinalysis was found to suggest a urinary tract infection.  Patient  continued to receive intravenous IV fluids.  Despite this, patient continued to exhibit severe weakness despite a two-person assist.  EDP contacted the hospitalist group to assess patient for admission to the hospital for urinary tract infection and volume depletion with acute kidney injury.  Review of Systems: Review of Systems  Unable to perform ROS: Dementia     Past Medical History:  Diagnosis Date   1st degree AV block    AAA (abdominal aortic aneurysm) ([place]) 03/2018   followed by dr Cyndia Bent--- last CTA  4.6cm   Anticoagulated 03/2018   xarelto for hx pe/ dvt--- managed by pcp   Arthritis    Cancer ([place])    Emphysema lung ([place])    Hiatal hernia    History of bladder cancer    urologist--- dr Milford Cage   s/p TURBT feb and march 2021   History of DVT of lower extremity 03/2018   LLE   History of prostate cancer 2002   s/p radioactive seed implants 04-11-2001   History of pulmonary embolus (PE) 03/2018   left side--  on xarelto   History of sepsis 01/30/2020   hospital admission in epic;  due to UTI   Impaired memory    Retained ureteral stent    right side   Sinus bradycardia 2018   cardiologist--- dr Cathie Olden   Urgency of urination     Past Surgical History:  Procedure Laterality Date   CATARACT EXTRACTION W/ INTRAOCULAR LENS IMPLANT Right 2020   CYSTOSCOPY N/A 10/30/2019   Procedure: CYSTOSCOPY;  Surgeon: Cleon Gustin, MD;  Location: [facility];  Service: Urology;  Laterality: N/A;   CYSTOSCOPY W/ RETROGRADES Bilateral 09/20/2019   Procedure: CYSTOSCOPY WITH RETROGRADE PYELOGRAM;  Surgeon: Cleon Gustin, MD;  Location: [facility];  Service: Urology;  Laterality: Bilateral;   CYSTOSCOPY W/ URETERAL STENT REMOVAL N/A 03/25/2020   Procedure: CYSTOSCOPY WITH  REMOVAL OF RETAINED FOLEY CATHETER,  FULGERATION;  Surgeon: Remi Haggard, MD;  Location: [facility];  Service: Urology;  Laterality: N/A;   FOOT SURGERY  yrs  ago   RADIOACTIVE PROSTATE SEED IMPLANTS  04-11-2001  dr Janice Norrie  '@[email or web address]'$    TRANSURETHRAL RESECTION OF BLADDER TUMOR N/A 09/20/2019   Procedure: TRANSURETHRAL RESECTION OF BLADDER TUMOR (TURBT);  Surgeon: Cleon Gustin, MD;  Location: [facility];  Service: Urology;  Laterality: N/A;  1 HR   TRANSURETHRAL RESECTION OF BLADDER TUMOR N/A 10/30/2019   Procedure: TRANSURETHRAL RESECTION OF BLADDER TUMOR (TURBT), REMOVAL OF RIGHT STENT;  Surgeon: Cleon Gustin, MD;  Location: [facility];  Service: Urology;  Laterality: N/A;    Social History:  reports that he quit smoking about 36 years ago. His smoking use included cigarettes. He has a 20.00 pack-year smoking history. He has never used smokeless tobacco. He reports that he does not drink alcohol and does not use drugs.  No Known Allergies  Family History  Problem Relation Age of Onset   Diabetes Sister    Hypertension Mother        family history   Healthy Father    Colon cancer Neg Hx     Prior to Admission medications   Medication Sig Start Date End Date Taking? Authorizing Provider  acetaminophen (TYLENOL) 325 MG tablet Take 2 tablets (650 mg total) by mouth every 6 (six) hours as needed for mild pain (or Fever >/= 101). 03/10/22  Yes Sheikh, Omair Latif, DO  feeding supplement (ENSURE ENLIVE / ENSURE PLUS) LIQD Take 237 mLs by mouth 2 (two) times daily between meals. 03/10/22  Yes Sheikh, Omair Latif, DO  Multiple Vitamins-Minerals (MULTIVITAMIN WITH MINERALS) tablet Take 1 tablet by mouth daily.    Yes [provider]  rivaroxaban (XARELTO) 20 MG TABS tablet Take 20 mg by mouth in the morning.   Yes [provider]  latanoprost (XALATAN) 0.005 % ophthalmic solution Place 1 drop into the right eye at bedtime. 09/28/21   [provider]    Physical Exam:  Vitals:   03/26/22 2129 03/26/22 2230 03/27/22 0014 03/27/22 0419  BP: (!) 167/66 (!) 197/82 (!) 158/97 (!) 176/98   Pulse: (!) 58 78 67 73  Resp: '16 16 18 18  '$ Temp: 97.9 F (36.6 C)  98.1 F (36.7 C) 98.8 F (37.1 C)  TempSrc: Oral  Oral Oral  SpO2: 100% 100% 97% 100%  Weight:      Height:        Constitutional: Lethargic but arousable and oriented x1, no associated distress.   Skin: no rashes, no lesions, poor skin turgor noted. Eyes: Pupils are equally reactive to light.  No evidence of scleral icterus or conjunctival pallor.  ENMT: Notably poor dentition on oral exam, dry mucous membranes noted.  Posterior pharynx clear of any exudate or lesions.   Neck: normal, supple, no masses, no thyromegaly.  No evidence of jugular venous distension.   Respiratory: clear to auscultation bilaterally, no wheezing, no crackles. Normal respiratory effort. No accessory muscle use.  Cardiovascular: Regular rate and rhythm, no murmurs / rubs / gallops. No extremity edema. 2+ pedal pulses. No carotid bruits.  Chest:   Nontender without crepitus or deformity.   Back:   Nontender without  crepitus or deformity. Abdomen: Abdomen is soft and nontender.  No evidence of intra-abdominal masses.  Positive bowel sounds noted in all quadrants.   Musculoskeletal: No joint deformity upper and lower extremities. Good ROM, no contractures.  Notably poor muscle tone Neurologic: CN 2-12 grossly intact. Sensation intact.  Patient moving all 4 extremities spontaneously.  Patient is following all commands.  Patient is responsive to verbal stimuli.   Psychiatric: Unable to fully assess due to encephalopathy superimposed on likely dementia.  Patient currently does not seem to possess insight as to his current situation.  Data Reviewed:  I have personally reviewed and interpreted labs, imaging.  Significant findings are:  Urinalysis revealing greater than 50 white blood cells per high-power field, rare bacteria, large leukocyte esterase. CXR:  Chest X-ray was personally reviewed.  No evidence of focal infiltrates.  No evidence of  pleural effusion.  No evidence of pneumothorax.    EKG: Personally reviewed.  Rhythm is sinus bradycardia with heart rate of 59 bpm.  Evidence of left anterior fascicular block.  No dynamic ST segment changes appreciated.  Assessment and Plan: * Complicated UTI (urinary tract infection) Urinalysis suggestive of urinary tract infection Underlying UTI may be the cause of the patient's sudden onset lethargy/confusion weakness and poor oral intake Patient has been placed on intravenous ceftriaxone Additionally hydrating with intravenous isotonic fluids Blood and urine cultures have been obtained   Acute kidney injury ([place]) Acute kidney injury likely secondary to volume depletion in the setting of urinary tract infection Hydrating patient with intravenous isotonic fluids Strict input and output monitoring Monitor renal function and electrolytes with serial chemistries Obtaining bladder scan to ensure there is no evidence of postobstructive uropathy If renal function fails to rapidly improve will expand work-up  Acute metabolic encephalopathy Patient exhibiting 24 to 48 hours history of rapidly progressive weakness, confusion and poor oral intake Most likely secondary to volume depletion and urinary tract infection Additionally obtaining ammonia, vitamin B12, folate, TSH Treating underlying infection and hydrating with intravenous isotonic fluids If patient fails to clinically improve and remainder of work-up is negative will consider MRI brain considering recent identification of recurrent prostate cancer  Current use of long term anticoagulation History of DVT/PE in 2019 Continue home regimen of Xarelto  Protein calorie malnutrition ([place]) Continue nutritional supplements per nutrition recommendations during last hospitalization Nutrition consultation order placed, their continued recommendations are appreciated.  Dementia without behavioral disturbance (HCC) Suspect some degree of  underlying dementia that has yet to be formally diagnosed Minimizing uncomfortable stimuli Minimizing mood altering and sedating agents Encouraging family to remain at bedside is much as possible Frequent redirection by staff Fall precautions       Code Status:  DNR  code status decision has been confirmed with: daughter Family Communication: Case discussed with daughter via phone conversation who has been updated on plan of care  Consults: None  Severity of Illness:  The appropriate patient status for this patient is OBSERVATION. Observation status is judged to be reasonable and necessary in order to provide the required intensity of service to ensure the patient's safety. The patient's presenting symptoms, physical exam findings, and initial radiographic and laboratory data in the context of their medical condition is felt to place them at decreased risk for further clinical deterioration. Furthermore, it is anticipated that the patient will be medically stable for discharge from the hospital within 2 midnights of admission.   Author:  Vernelle Emerald MD  03/27/2022 6:50 AM

## 2022-03-27 NOTE — Evaluation (Signed)
Physical Therapy Evaluation Patient Details Name: Connor Morris MRN: [id number] DOB: [birth year] Today's Date: 03/27/2022  History of Present Illness  88 yo male admitted with UTI, weakness, hypotension. Hx of bladder ca, prostate ca, DVT/PE, thoracic AA  Clinical Impression  On eval, pt required Min A-Mod A for mobility on today. He was able to sit EOB for a few minutes at supervision level before participating in pre-gait tasks at bedside with RW. Pt presents with general weakness, decreased activity tolerance, and impaired gait and balance. He appears confused with speech that is difficult to understand at times. At this time, PT recommendation is for ST SNF rehab.        Recommendations for follow up therapy are one component of a multi-disciplinary discharge planning process, led by the attending physician.  Recommendations may be updated based on patient status, additional functional criteria and insurance authorization.  Follow Up Recommendations Skilled nursing-short term rehab (<3 hours/day) Can patient physically be transported by private vehicle: Yes    Assistance Recommended at Discharge Frequent or constant Supervision/Assistance  Patient can return home with the following  A little help with bathing/dressing/bathroom;A little help with walking and/or transfers;Assistance with cooking/housework;Direct supervision/assist for medications management;Help with stairs or ramp for entrance;Assist for transportation;Direct supervision/assist for financial management    Equipment Recommendations None recommended by PT  Recommendations for Other Services       Functional Status Assessment Patient has had a recent decline in their functional status and demonstrates the ability to make significant improvements in function in a reasonable and predictable amount of time.     Precautions / Restrictions Precautions Precautions: Fall Restrictions Weight Bearing Restrictions: No       Mobility  Bed Mobility Overal bed mobility: Needs Assistance Bed Mobility: Supine to Sit, Sit to Supine     Supine to sit: Min assist Sit to supine: Min assist   General bed mobility comments: Cues provided. Increased time. Assist to get to EOB and back into bed. Pt relied on bedrail.    Transfers Overall transfer level: Needs assistance Equipment used: Rolling walker (2 wheels) Transfers: Sit to/from Stand Sit to Stand: Mod assist, From elevated surface           General transfer comment: Mod A to power up, stabilize, control descent. Pt with wide BOS and support of walker to stabilize in standing. Cues for safety, hand placement. 2 attempts to get into full standing.    Ambulation/Gait               General Gait Details: Pre-gait tasks at bedside: sides steps along side of bed with RW. Marching x 10 reps each leg. Min A to steady. Cues required.  Stairs            Wheelchair Mobility    Modified Rankin (Stroke Patients Only)       Balance Overall balance assessment: Needs assistance         Standing balance support: Reliant on assistive device for balance, Bilateral upper extremity supported Standing balance-Leahy Scale: Poor                               Pertinent Vitals/Pain Pain Assessment Pain Assessment: Faces Faces Pain Scale: No hurt    Home Living Family/patient expects to be discharged to:: Private residence Living Arrangements: Alone Available Help at Discharge: Family;Available PRN/intermittently Type of Home: House Home Access: Stairs to enter Entrance Stairs-Rails: Psychiatric nurse  of Steps: 2     Home Equipment: Rolling Walker (2 wheels);Cane - single point Additional Comments: pt stated he uses a cane or a walker when ambulating, that his son stops by to help as needed. Pt not oriented to location at present, so unclear if info he provided is accurate. patient reported having a chair to take  him up the stairs.    Prior Function Prior Level of Function : Patient poor historian/Family not available             Mobility Comments: walks with either a cane or walker ADLs Comments: reports son assists with bathing as needed; daughter helps with other tasks     Hand Dominance        Extremity/Trunk Assessment   Upper Extremity Assessment Upper Extremity Assessment: Defer to OT evaluation    Lower Extremity Assessment Lower Extremity Assessment: Generalized weakness    Cervical / Trunk Assessment Cervical / Trunk Assessment: Kyphotic  Communication   Communication: No difficulties  Cognition Arousal/Alertness: Awake/alert Behavior During Therapy: WFL for tasks assessed/performed Overall Cognitive Status: No family/caregiver present to determine baseline cognitive functioning Area of Impairment: Problem solving, Safety/judgement, Orientation                 Orientation Level: Disoriented to, Time, Situation       Safety/Judgement: Decreased awareness of safety   Problem Solving: Slow processing, Requires tactile cues, Requires verbal cues General Comments: patient was noted to have some confusion;difficulty understanding patient at times.        General Comments      Exercises     Assessment/Plan    PT Assessment Patient needs continued PT services  PT Problem List Decreased strength;Decreased mobility;Decreased activity tolerance;Decreased balance;Decreased knowledge of use of DME;Decreased cognition;Decreased safety awareness       PT Treatment Interventions DME instruction;Gait training;Therapeutic activities;Therapeutic exercise;Patient/family education;Balance training;Functional mobility training    PT Goals (Current goals can be found in the Care Plan section)  Acute Rehab PT Goals Patient Stated Goal: none stated PT Goal Formulation: Patient unable to participate in goal setting Time For Goal Achievement: 04/10/22 Potential to  Achieve Goals: Good    Frequency Min 3X/week     Co-evaluation               AM-PAC PT "6 Clicks" Mobility  Outcome Measure Help needed turning from your back to your side while in a flat bed without using bedrails?: A Little Help needed moving from lying on your back to sitting on the side of a flat bed without using bedrails?: A Little Help needed moving to and from a bed to a chair (including a wheelchair)?: A Little Help needed standing up from a chair using your arms (e.g., wheelchair or bedside chair)?: A Lot Help needed to walk in hospital room?: A Little Help needed climbing 3-5 steps with a railing? : Total 6 Click Score: 15    End of Session Equipment Utilized During Treatment: Gait belt Activity Tolerance: Patient tolerated treatment well;Patient limited by fatigue Patient left: in bed;with call bell/phone within reach;with family/visitor present;with bed alarm set   PT Visit Diagnosis: Muscle weakness (generalized) (M62.81);Difficulty in walking, not elsewhere classified (R26.2)    Time: 1962-2297 PT Time Calculation (min) (ACUTE ONLY): 14 min   Charges:   PT Evaluation $PT Eval Moderate Complexity: [place], PT Acute Rehabilitation  Office: [phone] Pager: [phone]

## 2022-03-27 NOTE — Progress Notes (Signed)
Brief same day note:   Patient is a 88 year old male with history of bladder cancer, prostate cancer, recurrent disease identified by PET following with urology, DVT/PE on Xarelto, thoracic aortic aneurysm who was recently admitted at this hospital for severe generalized weakness secondary to pneumonia and was discharged to skilled nursing facility on 7/27 presented again with complaints of progressive lethargic, confusion, poor oral intake, unable to ambulate.  On presentation he was hypotensive, started on IV fluids.  Urinalysis done on presentation was suggestive of UTI, started on antibiotics.  Culture sent. Patient seen and examined at the bedside this morning.  Hemodynamically stable, appears comfortable, alert and awake but not oriented. Obeys commands very well.  Assessment and plan  UTI: Presented with confusion, lethargic, poor oral intake.  UA suggestive of UTI, started on antibiotics.  We will follow-up cultures.  AKI: Likely in the setting of volume depletion from UTI, poor oral intake.  Continue gentle IV fluids for today.  Monitor kidney function.  Baseline kidney function normal.  Confusion/acute mild encephalopathy: Secondary to UTI.  Continue current antibiotics.  Delirium precautions.  Frequent reorientation.  Monitor mental status.  If mental status does not improve, will consider imaging of the brain.  History of PE/DVT: On Xarelto  Protein calorie malnourishment: Nutrition consult placed.  History of dementia: Not formally diagnosed yet but has some degree of underlying dementia.  Delirium precautions.  Frequent reorientation.  Hypotension: Presented with severe hypotension which improved with IV fluids.  Currently blood pressure on the higher range.  Monitor  Debility/deconditioning/weakness: Currently residing at a skilled nursing  facility.  We will consult PT/OT when appropriate here.  Expected discharge back to skilled nursing facility when medically ready.

## 2022-03-27 NOTE — Assessment & Plan Note (Signed)
   Patient exhibiting 24 to 48 hours history of rapidly progressive weakness, confusion and poor oral intake  Most likely secondary to volume depletion and urinary tract infection  Additionally obtaining ammonia, vitamin B12, folate, TSH  Treating underlying infection and hydrating with intravenous isotonic fluids  If patient fails to clinically improve and remainder of work-up is negative will consider MRI brain considering recent identification of recurrent prostate cancer

## 2022-03-28 DIAGNOSIS — N390 Urinary tract infection, site not specified: Secondary | ICD-10-CM | POA: Diagnosis not present

## 2022-03-28 LAB — CBC WITH DIFFERENTIAL/PLATELET
Abs Immature Granulocytes: 0.02 10*3/uL (ref 0.00–0.07)
Basophils Absolute: 0 10*3/uL (ref 0.0–0.1)
Basophils Relative: 0 %
Eosinophils Absolute: 0.1 10*3/uL (ref 0.0–0.5)
Eosinophils Relative: 2 %
HCT: 33.2 % — ABNORMAL LOW (ref 39.0–52.0)
Hemoglobin: 10.4 g/dL — ABNORMAL LOW (ref 13.0–17.0)
Immature Granulocytes: 0 %
Lymphocytes Relative: 18 %
Lymphs Abs: 1.2 10*3/uL (ref 0.7–4.0)
MCH: 28.3 pg (ref 26.0–34.0)
MCHC: 31.3 g/dL (ref 30.0–36.0)
MCV: 90.5 fL (ref 80.0–100.0)
Monocytes Absolute: 1.1 10*3/uL — ABNORMAL HIGH (ref 0.1–1.0)
Monocytes Relative: 16 %
Neutro Abs: 4.4 10*3/uL (ref 1.7–7.7)
Neutrophils Relative %: 64 %
Platelets: 185 10*3/uL (ref 150–400)
RBC: 3.67 MIL/uL — ABNORMAL LOW (ref 4.22–5.81)
RDW: 13.7 % (ref 11.5–15.5)
WBC: 6.8 10*3/uL (ref 4.0–10.5)
nRBC: 0 % (ref 0.0–0.2)

## 2022-03-28 LAB — COMPREHENSIVE METABOLIC PANEL
ALT: 16 U/L (ref 0–44)
AST: 16 U/L (ref 15–41)
Albumin: 2.9 g/dL — ABNORMAL LOW (ref 3.5–5.0)
Alkaline Phosphatase: 45 U/L (ref 38–126)
Anion gap: 7 (ref 5–15)
BUN: 16 mg/dL (ref 8–23)
CO2: 22 mmol/L (ref 22–32)
Calcium: 8.7 mg/dL — ABNORMAL LOW (ref 8.9–10.3)
Chloride: 110 mmol/L (ref 98–111)
Creatinine, Ser: 1.09 mg/dL (ref 0.61–1.24)
GFR, Estimated: >60 mL/min (ref >60)
Glucose, Bld: 86 mg/dL (ref 70–99)
Potassium: 3.7 mmol/L (ref 3.5–5.1)
Sodium: 139 mmol/L (ref 135–145)
Total Bilirubin: 0.9 mg/dL (ref 0.3–1.2)
Total Protein: 6.7 g/dL (ref 6.5–8.1)

## 2022-03-28 LAB — MAGNESIUM: Magnesium: 2 mg/dL (ref 1.7–2.4)

## 2022-03-28 MED ORDER — ADULT MULTIVITAMIN W/MINERALS CH
1.0000 | ORAL_TABLET | Freq: Every day | ORAL | Status: DC
  Administered 2022-03-28 – 2022-03-30 (×3): 1 via ORAL
  Filled 2022-03-28 (×3): qty 1

## 2022-03-28 NOTE — TOC Initial Note (Signed)
Transition of Care [facility]) - Initial/Assessment Note    Patient Details  Name: Connor Morris MRN: [id number] Date of Birth: January 27, 1934  [facility]) CM/SW Contact:    Lennart Pall, LCSW Phone Number: 03/28/2022, 3:10 PM  Clinical Narrative:                 Met with pt and spoke with daughter, [name], to review dc planning needs.  Daughter confirms pt had just recently discharged home from [facility] and had a quick functional decline once home prompting this readmission.  Notes he had made improvements from SNF rehab and is in agreement with MD and PT now that he return for further rehab.  Will begin SNF bed search to include [facility] and other local facilities.  Expected Discharge Plan: Skilled Nursing Facility Barriers to Discharge: Continued Medical Work up, Ship broker, SNF Pending bed offer   Patient Goals and CMS Choice Patient states their goals for this hospitalization and ongoing recovery are:: return to rehab      Expected Discharge Plan and Services Expected Discharge Plan: [place] In-house Referral: Clinical Social Work   Post Acute Care Choice: [place] Living arrangements for the past 2 months: [place]                                      Prior Living Arrangements/Services Living arrangements for the past 2 months: Single Family Home Lives with:: Self Patient language and need for interpreter reviewed:: Yes Do you feel safe going back to the place where you live?: Yes      Need for Family Participation in Patient Care: Yes (Comment) Care giver support system in place?: No (comment)   Criminal Activity/Legal Involvement Pertinent to Current Situation/Hospitalization: No - Comment as needed  Activities of Daily Living Home Assistive Devices/Equipment: None ADL Screening (condition at time of admission) Patient's cognitive ability adequate to safely complete daily activities?: No Is  the patient deaf or have difficulty hearing?: No Does the patient have difficulty seeing, even when wearing glasses/contacts?: No Does the patient have difficulty concentrating, remembering, or making decisions?: Yes Patient able to express need for assistance with ADLs?: Yes Does the patient have difficulty dressing or bathing?: Yes Independently performs ADLs?: No Communication: Independent Dressing (OT): Needs assistance Is this a change from baseline?: Pre-admission baseline Grooming: Needs assistance Is this a change from baseline?: Pre-admission baseline Feeding: Needs assistance Is this a change from baseline?: Pre-admission baseline Bathing: Needs assistance Is this a change from baseline?: Pre-admission baseline Toileting: Needs assistance Is this a change from baseline?: Pre-admission baseline In/Out Bed: Needs assistance Is this a change from baseline?: Pre-admission baseline Walks in Home: Needs assistance Is this a change from baseline?: Pre-admission baseline Does the patient have difficulty walking or climbing stairs?: Yes Weakness of Legs: Both Weakness of Arms/Hands: None  Permission Sought/Granted Permission sought to share information with : Facility Sport and exercise psychologist, Family Supports Permission granted to share information with : Yes, Verbal Permission Granted  Share Information with NAME: Cornel Werber     Permission granted to share info w Relationship: daughter  Permission granted to share info w Contact Information: [phone]  Emotional Assessment Appearance:: Appears stated age Attitude/Demeanor/Rapport: Gracious Affect (typically observed): Accepting Orientation: : Oriented to Self, Oriented to Place Alcohol / Substance Use: Not Applicable Psych Involvement: No (comment)  Admission diagnosis:  Lower urinary tract infectious disease [  N39.0] Generalized weakness [R53.1] Acute kidney injury ([place]) [N17.9] AMS (altered mental status)  [R41.82] Patient Active Problem List   Diagnosis Date Noted   Complicated UTI (urinary tract infection) 03/27/2022   Current use of long term anticoagulation 03/27/2022   Dementia without behavioral disturbance ([place]) 03/27/2022   AMS (altered mental status) 03/27/2022   Acute kidney injury ([place]) 03/26/2022   Malnutrition of moderate degree 03/08/2022   CAP (community acquired pneumonia) 03/08/2022   PNA (pneumonia) 03/07/2022   Dehydration 03/07/2022   Generalized weakness 03/07/2022   Failure to thrive in adult 03/07/2022   Hematuria 03/25/2020   Protein calorie malnutrition ([place]) 01/31/2020   Sepsis secondary to UTI ([place]) 01/30/2020   History of pulmonary embolism 95/04/3266   Acute metabolic encephalopathy 12/45/8099   Bladder cancer ([place])    Acute pulmonary embolism without acute cor pulmonale ([place]) 04/05/2018   History of prostate cancer 04/05/2018   Thoracic ascending aortic aneurysm ([place]) 04/05/2018   Sinus bradycardia 09/16/2016   Shortness of breath 09/16/2016   Bradycardia 09/14/2016   1st degree AV block 09/14/2016   Dizziness 09/14/2016   PCP:  Velna Hatchet, MD Pharmacy:   [facility] [place], [place] [address] [address] Phone: [phone] Fax: [phone]     Social Determinants of Health (SDOH) Interventions    Readmission Risk Interventions    03/10/2022    9:39 AM  Readmission Risk Prevention Plan  Post Dischage Appt Complete  Medication Screening Complete  Transportation Screening Complete

## 2022-03-28 NOTE — Progress Notes (Signed)
Initial Nutrition Assessment  DOCUMENTATION CODES:   Not applicable  INTERVENTION:  Liberalize diet from a heart healthy to a regular diet to provide widest variety of menu options to enhance nutritional adequacy Continue Ensure Plus High Protein po BID, each supplement provides 350 kcal and 20 grams of protein. MVI with minerals daily  NUTRITION DIAGNOSIS:   Increased nutrient needs related to chronic illness as evidenced by estimated needs.  GOAL:   Patient will meet greater than or equal to 90% of their needs  MONITOR:   PO intake, Supplement acceptance, Diet advancement, Labs, Weight trends  REASON FOR ASSESSMENT:   Consult Assessment of nutrition requirement/status, Poor PO  ASSESSMENT:   Pt recently admitted 7/23-7/27 for severe generalized weakness d/t PNA complicated by acute metabolic encephalopathy. Pt presenting with hypotension and weakness r/t complicated UTI. PMH significant for bladder cancer, prostates cancer with recurrence 01/2022, CVT/PE, thoracic aortic aneurysm (2021) and PCM.  Per review of chart, since pt's d/c from PT on 8/10 he has experienced rapid decline with associated poor PO intake and worsening weakness.   Unsuccessful attempt to reach pt via phone call to room to obtain nutrition related history.   Meal completions: 8/12: 100%-breakfast, 100%-lunch, 50%-dinner  Reviewed weight history. There is limited documentation of weight history on file within the last year however it appears that his wt is +5 kg within the last year.   Patient noted to have moderate chronic malnutrition during last admission. Suspect this to be ongoing. He would benefit from a liberalized diet and continuing to offer nutrition supplements as appropriate.   Medications: IV abx  Labs: reviewed   NUTRITION - FOCUSED PHYSICAL EXAM: RD working remotely. Deferred to follow up.   Diet Order:   Diet Order             Diet regular Room service appropriate? Yes; Fluid  consistency: Thin  Diet effective now                   EDUCATION NEEDS:   No education needs have been identified at this time  Skin:  Skin Assessment: Reviewed RN Assessment  Last BM:  8/12  Height:   Ht Readings from Last 1 Encounters:  03/26/22 '5\' 10"'$  (1.778 m)    Weight:   Wt Readings from Last 1 Encounters:  03/26/22 66.5 kg   BMI:  Body mass index is 21.04 kg/m.  Estimated Nutritional Needs:   Kcal:  1900-2100  Protein:  85-95g  Fluid:  >/=1.9L  Clayborne Dana, RDN, LDN Clinical Nutrition

## 2022-03-28 NOTE — NC FL2 (Signed)
[place] MEDICAID FL2 LEVEL OF CARE SCREENING TOOL     IDENTIFICATION  Patient Name: Connor Morris Birthdate: Jul 28, 1934 Sex: male Admission Date (Current Location): 03/26/2022  [facility] and [place] Number:  Herbalist and Address:  [facility],  [place], [place]      Provider Number: [id number]  Attending Physician Name and Address:  Shelly Coss, MD  Relative Name and Phone Number:  [name] ([phone])    Current Level of Care: Hospital Recommended Level of Care: [place] Prior Approval Number:    Date Approved/Denied:   PASRR Number: [id number]  Discharge Plan: SNF    Current Diagnoses: Patient Active Problem List   Diagnosis Date Noted   Complicated UTI (urinary tract infection) 03/27/2022   Current use of long term anticoagulation 03/27/2022   Dementia without behavioral disturbance ([place]) 03/27/2022   AMS (altered mental status) 03/27/2022   Acute kidney injury ([place]) 03/26/2022   Malnutrition of moderate degree 03/08/2022   CAP (community acquired pneumonia) 03/08/2022   PNA (pneumonia) 03/07/2022   Dehydration 03/07/2022   Generalized weakness 03/07/2022   Failure to thrive in adult 03/07/2022   Hematuria 03/25/2020   Protein calorie malnutrition ([place]) 01/31/2020   Sepsis secondary to UTI ([place]) 01/30/2020   History of pulmonary embolism 99/35/7017   Acute metabolic encephalopathy 79/39/0300   Bladder cancer ([place])    Acute pulmonary embolism without acute cor pulmonale ([place]) 04/05/2018   History of prostate cancer 04/05/2018   Thoracic ascending aortic aneurysm ([facility]) 04/05/2018   Sinus bradycardia 09/16/2016   Shortness of breath 09/16/2016   Bradycardia 09/14/2016   1st degree AV block 09/14/2016   Dizziness 09/14/2016    Orientation RESPIRATION BLADDER Height & Weight     Self, Place  Normal Incontinent Weight: 146 lb 9.7 oz (66.5 kg) Height:  '5\' 10"'$  (177.8 cm)  BEHAVIORAL  SYMPTOMS/MOOD NEUROLOGICAL BOWEL NUTRITION STATUS      Continent Diet (Regular)  AMBULATORY STATUS COMMUNICATION OF NEEDS Skin   Extensive Assist Verbally Normal                       Personal Care Assistance Level of Assistance  Bathing, Dressing, Feeding Bathing Assistance: Limited assistance Feeding assistance: Limited assistance Dressing Assistance: Limited assistance     Functional Limitations Info  Sight, Hearing, Speech Sight Info: Adequate Hearing Info: Adequate Speech Info: Adequate    SPECIAL CARE FACTORS FREQUENCY  PT (By licensed PT), OT (By licensed OT)     PT Frequency: 5x/wk OT Frequency: 5x/wk            Contractures Contractures Info: Not present    Additional Factors Info  Code Status, Allergies Code Status Info: DNR Allergies Info: NKDA           Current Medications (03/28/2022):  This is the current hospital active medication list Current Facility-Administered Medications  Medication Dose Route Frequency Provider Last Rate Last Admin   acetaminophen (TYLENOL) tablet 650 mg  650 mg Oral Q6H PRN Shalhoub, Sherryll Burger, MD       Or   acetaminophen (TYLENOL) suppository 650 mg  650 mg Rectal Q6H PRN Shalhoub, Sherryll Burger, MD       cefTRIAXone (ROCEPHIN) 1 g in sodium chloride 0.9 % 100 mL IVPB  1 g Intravenous Q24H Shalhoub, Sherryll Burger, MD   Stopped at 03/27/22 2133   feeding supplement (ENSURE ENLIVE / ENSURE PLUS) liquid 237 mL  237 mL Oral BID BM  Vernelle Emerald, MD   237 mL at 03/28/22 1340   hydrALAZINE (APRESOLINE) injection 10 mg  10 mg Intravenous Q6H PRN Shalhoub, Sherryll Burger, MD       multivitamin with minerals tablet 1 tablet  1 tablet Oral Daily Shelly Coss, MD   1 tablet at 03/28/22 1340   ondansetron (ZOFRAN) tablet 4 mg  4 mg Oral Q6H PRN Shalhoub, Sherryll Burger, MD       Or   ondansetron [facility]) injection 4 mg  4 mg Intravenous Q6H PRN Shalhoub, Sherryll Burger, MD       polyethylene glycol (MIRALAX / GLYCOLAX) packet 17 g  17 g Oral Daily PRN  Shalhoub, Sherryll Burger, MD       rivaroxaban Alveda Reasons) tablet 20 mg  20 mg Oral Q supper Shalhoub, Sherryll Burger, MD   20 mg at 03/27/22 1140     Discharge Medications: Please see discharge summary for a list of discharge medications.  Relevant Imaging Results:  Relevant Lab Results:   Additional Information SSN: [id number]  Lennart Pall, LCSW

## 2022-03-28 NOTE — Progress Notes (Signed)
PROGRESS NOTE  Connor Morris  WUJ:[id number] DOB: 11/13/1933 DOA: 03/26/2022 PCP: Velna Hatchet, MD   Brief Narrative:  Patient is a 88 year old male with history of bladder cancer, prostate cancer, recurrent disease identified by PET following with urology, DVT/PE on Xarelto, thoracic aortic aneurysm who was recently admitted at this hospital for severe generalized weakness secondary to pneumonia and was discharged to skilled nursing facility on 7/27 and was he discharged to home, presented again with complaints of progressive lethargic, confusion, poor oral intake, unable to ambulate.  On presentation he was hypotensive, started on IV fluids.  Urinalysis done on presentation was suggestive of UTI, started on antibiotics.  Culture sent.    Assessment & Plan:  Principal Problem:   Complicated UTI (urinary tract infection) Active Problems:   Acute kidney injury ([place])   Acute metabolic encephalopathy   Current use of long term anticoagulation   Protein calorie malnutrition ([place])   Dementia without behavioral disturbance ([place])   AMS (altered mental status)   UTI: Presented with confusion, lethargic, poor oral intake.  UA suggestive of UTI, started on antibiotics.  We will follow-up cultures.  AKI: Likely in the setting of volume depletion from UTI, poor oral intake.  Kidney function has returned to baseline.  Confusion/acute mild encephalopathy: Secondary to UTI.  Continue current antibiotics.  Delirium precautions.  Frequent reorientation.  Monitor mental status.  His mentation might have improved today.  He knows that he is in the hospital.  History of PE/DVT: On Xarelto  Protein calorie malnourishment: Nutrition consult placed.  History of dementia: Not formally diagnosed yet but has some degree of underlying dementia.  Delirium precautions.  Frequent reorientation.  Hypotension: Presented with severe hypotension which improved with IV fluids.  Currently blood pressure on the  higher range.  Monitor  Debility/deconditioning/weakness: Currently residing at home, recently discharged back to home from a skilled nursing facility .Pt/OT recommending skilled facility on discharge.  TOC consulted   Nutrition Problem: Increased nutrient needs Etiology: chronic illness    DVT prophylaxis: rivaroxaban (XARELTO) tablet 20 mg     Code Status: DNR  Family Communication: Called and discussed with daughter on phone on 8/13  Patient status:Inpatient  Patient is from :Home  Anticipated discharge to: SnF  Estimated DC date:1-2 days   Consultants: None  Procedures:None  Antimicrobials:  Anti-infectives (From admission, onward)    Start     Dose/Rate Route Frequency Ordered Stop   03/27/22 2100  cefTRIAXone (ROCEPHIN) 1 g in sodium chloride 0.9 % 100 mL IVPB        1 g 200 mL/hr over 30 Minutes Intravenous Every 24 hours 03/27/22 0619     03/26/22 2115  cefTRIAXone (ROCEPHIN) 1 g in sodium chloride 0.9 % 100 mL IVPB        1 g 200 mL/hr over 30 Minutes Intravenous  Once 03/26/22 2107 03/26/22 2234       Subjective:  Patient seen and examined at bedside this morning.  His mentation might have improved today.  Is more alert, awake, communicates, obeys commands well.  He knows that today he is in the hospital.  Not in any Distress.  Objective: Vitals:   03/27/22 0823 03/27/22 1216 03/27/22 1953 03/28/22 0613  BP: 127/72 122/70 (!) 150/64 (!) 158/69  Pulse: 74 72 70 61  Resp: '18 18 18 18  '$ Temp: (!) 97.5 F (36.4 C) 98 F (36.7 C) 99.4 F (37.4 C) 98.6 F (37 C)  TempSrc: Oral Oral Oral Oral  SpO2: 99%  98% 97% 99%  Weight:      Height:        Intake/Output Summary (Last 24 hours) at 03/28/2022 1138 Last data filed at 03/28/2022 0900 Gross per 24 hour  Intake 700 ml  Output 1400 ml  Net -700 ml   Filed Weights   03/26/22 1700  Weight: 66.5 kg    Examination:  General exam: Overall comfortable, not in distress, very deconditioned,  weak HEENT: PERRL Respiratory system:  no wheezes or crackles  Cardiovascular system: S1 & S2 heard, RRR.  Gastrointestinal system: Abdomen is nondistended, soft and nontender. Central nervous system: Alert and weak, obese commands, oriented to place Extremities: No edema, no clubbing ,no cyanosis Skin: No rashes, no ulcers,no icterus     Data Reviewed: I have personally reviewed following labs and imaging studies  CBC: Recent Labs  Lab 03/26/22 1734 03/28/22 0825  WBC 9.6 6.8  NEUTROABS 7.5 4.4  HGB 12.0* 10.4*  HCT 37.5* 33.2*  MCV 89.9 90.5  PLT 254 170   Basic Metabolic Panel: Recent Labs  Lab 03/26/22 1734 03/28/22 0825  NA 137 139  K 4.2 3.7  CL 105 110  CO2 23 22  GLUCOSE 116* 86  BUN 19 16  CREATININE 1.51* 1.09  CALCIUM 9.1 8.7*  MG  --  2.0     Recent Results (from the past 240 hour(s))  Urine Culture     Status: None (Preliminary result)   Collection Time: 03/26/22  8:24 PM   Specimen: Urine, Clean Catch  Result Value Ref Range Status   Specimen Description   Final    URINE, CLEAN CATCH Performed at [place] [address], [place], [place] [address]    Special Requests   Final    NONE Performed at [facility], [place] [address] [place], [place] [address]    Culture   Final    CULTURE REINCUBATED FOR BETTER GROWTH Performed at [facility], [place] [address] [place], [place] [address]    Report Status PENDING  Incomplete  Culture, blood (Routine X 2) w Reflex to ID Panel     Status: None (Preliminary result)   Collection Time: 03/27/22  8:06 AM   Specimen: BLOOD  Result Value Ref Range Status   Specimen Description   Final    BLOOD BLOOD RIGHT HAND Performed at [facility] [address] [place], [place] [address]    Special Requests   Final    IN PEDIATRIC BOTTLE Blood Culture results may not be optimal due to an inadequate volume of blood received in culture  bottles Performed at [facility] [address] [place], [place] [address]    Culture   Final    NO GROWTH < 24 HOURS Performed at [facility] [address], [place], [place] [address]    Report Status PENDING  Incomplete  Culture, blood (Routine X 2) w Reflex to ID Panel     Status: None (Preliminary result)   Collection Time: 03/27/22  8:08 AM   Specimen: BLOOD  Result Value Ref Range Status   Specimen Description   Final    BLOOD RIGHT ANTECUBITAL Performed at [facility] [address] [place], [place] [address]    Special Requests   Final    IN PEDIATRIC BOTTLE Blood Culture results may not be optimal due to an inadequate volume of blood received in culture bottles Performed at [facility]  [facility], [place] [address] [place], [place] [address]    Culture   Final    NO GROWTH < 24 HOURS Performed at [place] [address] [place], [place] [address]    Report Status PENDING  Incomplete  MRSA Next Gen by PCR, Nasal     Status: None   Collection Time: 03/27/22  9:50 AM   Specimen: Nasal Mucosa; Nasal Swab  Result Value Ref Range Status   MRSA by PCR Next Gen NOT DETECTED NOT DETECTED Final    Comment: (NOTE) The GeneXpert MRSA Assay (FDA approved for NASAL specimens only), is one component of a comprehensive MRSA colonization surveillance program. It is not intended to diagnose MRSA infection nor to guide or monitor treatment for MRSA infections. Test performance is not FDA approved in patients less than 51 years old. Performed at [facility], [place] [address], [place], [place] [address]      Radiology Studies: DG Chest 2 View  Result Date: 03/26/2022 CLINICAL DATA:  Weakness EXAM: CHEST - 2 VIEW COMPARISON:  Chest x-ray 03/07/2022 FINDINGS: The heart size and mediastinal contours are within normal limits. There is minimal bibasilar atelectasis. There is no pleural effusion or  pneumothorax. No acute fractures are seen. Stable compression deformity of approximate L1. IMPRESSION: No active cardiopulmonary disease. Electronically Signed   By: Ronney Asters M.D.   On: 03/26/2022 18:46   CT Head Wo Contrast  Result Date: 03/26/2022 CLINICAL DATA:  Altered mental status EXAM: CT HEAD WITHOUT CONTRAST TECHNIQUE: Contiguous axial images were obtained from the base of the skull through the vertex without intravenous contrast. RADIATION DOSE REDUCTION: This exam was performed according to the departmental dose-optimization program which includes automated exposure control, adjustment of the mA and/or kV according to patient size and/or use of iterative reconstruction technique. COMPARISON:  01/30/2020 FINDINGS: Brain: No acute intracranial findings are seen. There are no signs of bleeding within the cranium. Cortical sulci are prominent. There is decreased density in periventricular white matter. Vascular: Unremarkable. Skull: Unremarkable. Sinuses/Orbits: There is mucosal thickening in the ethmoid and maxillary sinuses. Other: None. IMPRESSION: No acute intracranial findings are seen in noncontrast CT brain. Atrophy. Small-vessel disease. Mild chronic sinusitis. Electronically Signed   By: Elmer Picker M.D.   On: 03/26/2022 18:41    Scheduled Meds:  feeding supplement  237 mL Oral BID BM   multivitamin with minerals  1 tablet Oral Daily   rivaroxaban  20 mg Oral Q supper   Continuous Infusions:  cefTRIAXone (ROCEPHIN)  IV Stopped (03/27/22 2133)     LOS: 1 day   Shelly Coss, MD [facility] P8/13/2023, 11:38 AM

## 2022-03-28 NOTE — Evaluation (Signed)
Occupational Therapy Evaluation Patient Details Name: Connor Morris MRN: [id number] DOB: 1933/10/23 Today's Date: 03/28/2022   History of Present Illness 88 yo male admitted with UTI, weakness, hypotension. Hx of bladder ca, prostate ca, DVT/PE, thoracic AA   Clinical Impression   Pt admitted with the above diagnoses and presents with below problem list. Pt will benefit from continued acute OT to address the below listed deficits and maximize independence with basic ADLs prior to d/c to venue below. Per chart review, at baseline pt uses cane vs walker, assist with bathing. Pt unable to come to full standing position this session, limited by hip pain. Able to tolerate sitting EOB a few minutes. Currently needs up to min A with UB ADLs, up to max A with LB ADLs.        Recommendations for follow up therapy are one component of a multi-disciplinary discharge planning process, led by the attending physician.  Recommendations may be updated based on patient status, additional functional criteria and insurance authorization.   Follow Up Recommendations  Skilled nursing-short term rehab (<3 hours/day)    Assistance Recommended at Discharge Frequent or constant Supervision/Assistance  Patient can return home with the following      Functional Status Assessment  Patient has had a recent decline in their functional status and demonstrates the ability to make significant improvements in function in a reasonable and predictable amount of time.  Equipment Recommendations  Other (comment) (defer to next venue)    Recommendations for Other Services       Precautions / Restrictions Precautions Precautions: Fall Restrictions Weight Bearing Restrictions: No      Mobility Bed Mobility Overal bed mobility: Needs Assistance Bed Mobility: Supine to Sit, Sit to Supine     Supine to sit: Min assist Sit to supine: Min assist   General bed mobility comments: Assist to advance BLE. pt pulling up  on rails vs therapist arms to powerup trunk.    Transfers Overall transfer level: Needs assistance Equipment used: Rolling walker (2 wheels) Transfers: Sit to/from Stand             General transfer comment: attempted standing but unsucessful. Pt c/o increased pain and became tremulous during attempt. Able to minimally clear buttocks/hips.      Balance Overall balance assessment: Needs assistance Sitting-balance support: Feet supported, No upper extremity supported                                       ADL either performed or assessed with clinical judgement   ADL Overall ADL's : Needs assistance/impaired Eating/Feeding: Set up;Sitting   Grooming: Set up;Sitting   Upper Body Bathing: Minimal assistance;Sitting   Lower Body Bathing: Moderate assistance;Sit to/from stand;Sitting/lateral leans   Upper Body Dressing : Minimal assistance;Sitting   Lower Body Dressing: Maximal assistance;Sit to/from stand                 General ADL Comments: Pt tolerated sitting EOB a few minutes. Attempted to stand with pt able to clear buttocks/hips minimally, unable to come to full/functional standing position.     Vision         Perception     Praxis      Pertinent Vitals/Pain Pain Assessment Pain Assessment: Faces Faces Pain Scale: Hurts little more Pain Location: L hip to back during bed mobility to EOB Pain Descriptors / Indicators: Grimacing, Guarding Pain Intervention(s): Limited activity  within patient's tolerance, Monitored during session, Repositioned, Heat applied (heated blanket)     Hand Dominance Right   Extremity/Trunk Assessment Upper Extremity Assessment Upper Extremity Assessment: Generalized weakness   Lower Extremity Assessment Lower Extremity Assessment: Defer to PT evaluation   Cervical / Trunk Assessment Cervical / Trunk Assessment: Kyphotic   Communication Communication Communication: No difficulties   Cognition  Arousal/Alertness: Awake/alert Behavior During Therapy: Flat affect, WFL for tasks assessed/performed Overall Cognitive Status: No family/caregiver present to determine baseline cognitive functioning Area of Impairment: Problem solving, Safety/judgement, Orientation                 Orientation Level: Disoriented to, Time, Situation       Safety/Judgement: Decreased awareness of safety   Problem Solving: Slow processing, Requires tactile cues, Requires verbal cues       General Comments       Exercises     Shoulder Instructions      Home Living Family/patient expects to be discharged to:: Private residence Living Arrangements: Alone Available Help at Discharge: Family;Available PRN/intermittently Type of Home: House Home Access: Stairs to enter [employer] of Steps: 2 Entrance Stairs-Rails: Right;Left           Bathroom Toilet: Standard     Home Equipment: Conservation officer, nature (2 wheels);Cane - single point          Prior Functioning/Environment Prior Level of Function : Patient poor historian/Family not available             Mobility Comments: walks with either a cane or walker ADLs Comments: reports son assists with bathing as needed; daughter helps with other tasks        OT Problem List: Decreased strength;Decreased activity tolerance;Impaired balance (sitting and/or standing);Decreased safety awareness;Decreased knowledge of precautions;Decreased knowledge of use of DME or AE;Pain      OT Treatment/Interventions: Self-care/ADL training;Therapeutic exercise;Neuromuscular education;Energy conservation;DME and/or AE instruction;Therapeutic activities;Balance training;Patient/family education    OT Goals(Current goals can be found in the care plan section) Acute Rehab OT Goals Patient Stated Goal: to warm up and stop feeling so cold OT Goal Formulation: With patient Time For Goal Achievement: 04/11/22 Potential to Achieve Goals: Fair ADL  Goals Pt Will Perform Grooming: with set-up;with supervision;sitting Pt Will Perform Lower Body Bathing: with min assist;sitting/lateral leans;sit to/from stand Pt Will Perform Lower Body Dressing: with min assist;sit to/from stand;sitting/lateral leans Pt Will Transfer to Toilet: with mod assist;stand pivot transfer;bedside commode Pt Will Perform Toileting - Clothing Manipulation and hygiene: with mod assist;sit to/from stand;sitting/lateral leans  OT Frequency: Min 2X/week    Co-evaluation              AM-PAC OT "6 Clicks" Daily Activity     Outcome Measure Help from another person eating meals?: A Little Help from another person taking care of personal grooming?: A Little Help from another person toileting, which includes using toliet, bedpan, or urinal?: Total Help from another person bathing (including washing, rinsing, drying)?: A Lot Help from another person to put on and taking off regular upper body clothing?: A Little Help from another person to put on and taking off regular lower body clothing?: A Lot 6 Click Score: 14   End of Session Equipment Utilized During Treatment: Rolling walker (2 wheels) Nurse Communication: Mobility status  Activity Tolerance: Patient limited by fatigue;Patient limited by pain Patient left: in bed;with call bell/phone within reach;with bed alarm set  OT Visit Diagnosis: Unsteadiness on feet (R26.81);Pain;Muscle weakness (generalized) (M62.81);Other symptoms and signs involving  cognitive function                Time: 1156-1209 OT Time Calculation (min): 13 min Charges:  OT General Charges $OT Visit: 1 Visit OT Evaluation $OT Eval Moderate Complexity: [place], OT Acute Rehabilitation Services Office: [phone]   Hortencia Pilar 03/28/2022, 1:42 PM

## 2022-03-29 DIAGNOSIS — N390 Urinary tract infection, site not specified: Secondary | ICD-10-CM | POA: Diagnosis not present

## 2022-03-29 LAB — URINE CULTURE: Culture: 50000 — AB

## 2022-03-29 MED ORDER — AMOXICILLIN 250 MG PO CAPS
500.0000 mg | ORAL_CAPSULE | Freq: Three times a day (TID) | ORAL | Status: DC
Start: 2022-03-29 — End: 2022-03-30
  Administered 2022-03-29 – 2022-03-30 (×4): 500 mg via ORAL
  Filled 2022-03-29 (×4): qty 2

## 2022-03-29 MED ORDER — AMLODIPINE BESYLATE 5 MG PO TABS
5.0000 mg | ORAL_TABLET | Freq: Every day | ORAL | Status: DC
  Administered 2022-03-29 – 2022-03-30 (×2): 5 mg via ORAL
  Filled 2022-03-29 (×2): qty 1

## 2022-03-29 NOTE — TOC Progression Note (Signed)
Transition of Care [facility]) - Progression Note    Patient Details  Name: Hoy Fallert MRN: [id number] Date of Birth: 07/17/1934  [facility]) CM/SW [place], LCSW Phone Number: 03/29/2022, 3:29 PM  Clinical Narrative:    CSW spoke with pt's daughter Stran Raper to discuss bed offers, Ms. Haden has chosen [facility]. CSW attempted to confirm the bed offer with the facility and left a message for [facility] with admissions requesting a return call. TOC to follow.    Expected Discharge Plan: Learned Barriers to Discharge: Continued Medical Work up, Ship broker, SNF Pending bed offer  Expected Discharge Plan and Services Expected Discharge Plan: [place] In-house Referral: Clinical Social Work   Post Acute Care Choice: [place] Living arrangements for the past 2 months: Single Family Home                                       Social Determinants of Health (SDOH) Interventions    Readmission Risk Interventions    03/10/2022    9:39 AM  Readmission Risk Prevention Plan  Post Dischage Appt Complete  Medication Screening Complete  Transportation Screening Complete

## 2022-03-29 NOTE — Progress Notes (Signed)
Physical Therapy Treatment Patient Details Name: Connor Morris MRN: [id number] DOB: Jan 01, 1934 Today's Date: 03/29/2022   History of Present Illness 88 yo male admitted with UTI, weakness, hypotension. Hx of bladder ca, prostate ca, DVT/PE, thoracic AA, recent DC from SNF with decline    PT Comments    Patient able to ambulate x 25' with Rw and min assistance. Patient will benefit from SNF .    Recommendations for follow up therapy are one component of a multi-disciplinary discharge planning process, led by the attending physician.  Recommendations may be updated based on patient status, additional functional criteria and insurance authorization.  Follow Up Recommendations  Skilled nursing-short term rehab (<3 hours/day) Can patient physically be transported by private vehicle: Yes   Assistance Recommended at Discharge Frequent or constant Supervision/Assistance  Patient can return home with the following A little help with bathing/dressing/bathroom;A little help with walking and/or transfers;Assistance with cooking/housework;Direct supervision/assist for medications management;Help with stairs or ramp for entrance;Assist for transportation;Direct supervision/assist for financial management   Equipment Recommendations       Recommendations for Other Services       Precautions / Restrictions Precautions Precautions: Fall     Mobility  Bed Mobility   Bed Mobility: Supine to Sit     Supine to sit: Min assist     General bed mobility comments: Assist to advance BLE.  therapist arms to powerup trunk. extra time to initiate    Transfers Overall transfer level: Needs assistance Equipment used: Rolling walker (2 wheels)   Sit to Stand: Mod assist, From elevated surface   Step pivot transfers: Min assist       General transfer comment: ambulated to recliner    Ambulation/Gait Ambulation/Gait assistance: Min assist Gait Distance (Feet): 25 Feet Assistive device:  Rolling walker (2 wheels) Gait Pattern/deviations: Step-to pattern, Decreased stride length       General Gait Details: parient able to ambulate in roommtoday with RW   Stairs             Wheelchair Mobility    Modified Rankin (Stroke Patients Only)       Balance Overall balance assessment: Needs assistance Sitting-balance support: Feet supported, No upper extremity supported Sitting balance-Leahy Scale: Good     Standing balance support: Reliant on assistive device for balance, Bilateral upper extremity supported Standing balance-Leahy Scale: Poor                              Cognition Arousal/Alertness: Awake/alert Behavior During Therapy: Flat affect, WFL for tasks assessed/performed Overall Cognitive Status: No family/caregiver present to determine baseline cognitive functioning Area of Impairment: Problem solving, Safety/judgement, Orientation                 Orientation Level: Disoriented to, Time, Situation       Safety/Judgement: Decreased awareness of safety   Problem Solving: Slow processing, Requires tactile cues, Requires verbal cues General Comments: ;difficulty understanding patient at times. Patiewnt reports being cold and dizzy        Exercises      General Comments        Pertinent Vitals/Pain Pain Assessment Pain Assessment: No/denies pain    Home Living                          Prior Function            PT Goals (current goals can now be  found in the care plan section) Progress towards PT goals: Progressing toward goals    Frequency    Min 2X/week      PT Plan Current plan remains appropriate;Frequency needs to be updated    Co-evaluation              AM-PAC PT "6 Clicks" Mobility   Outcome Measure  Help needed turning from your back to your side while in a flat bed without using bedrails?: A Little Help needed moving from lying on your back to sitting on the side of a flat bed  without using bedrails?: A Little Help needed moving to and from a bed to a chair (including a wheelchair)?: A Little Help needed standing up from a chair using your arms (e.g., wheelchair or bedside chair)?: A Little Help needed to walk in hospital room?: A Little Help needed climbing 3-5 steps with a railing? : Total 6 Click Score: 16    End of Session Equipment Utilized During Treatment: Gait belt Activity Tolerance: Patient tolerated treatment well Patient left: in chair;with call bell/phone within reach;with chair alarm set Nurse Communication: Mobility status PT Visit Diagnosis: Muscle weakness (generalized) (M62.81);Difficulty in walking, not elsewhere classified (R26.2)     Time: 2820-6015 PT Time Calculation (min) (ACUTE ONLY): 10 min  Charges:  $Gait Training: 8-22 mins                     [place] Office [phone] Weekend [phone]    Claretha Cooper 03/29/2022, 10:13 AM

## 2022-03-29 NOTE — Progress Notes (Signed)
PROGRESS NOTE  Connor Morris  LNZ:[id number] DOB: 02-10-1934 DOA: 03/26/2022 PCP: Velna Hatchet, MD   Brief Narrative:  Patient is a 88 year old male with history of bladder cancer, prostate cancer, recurrent disease identified by PET following with urology, DVT/PE on Xarelto, thoracic aortic aneurysm who was recently admitted at this hospital for severe generalized weakness secondary to pneumonia and was discharged to skilled nursing facility on 7/27 and was he discharged to home, presented again with complaints of progressive lethargic, confusion, poor oral intake, unable to ambulate.  On presentation he was hypotensive, started on IV fluids.  Urinalysis done on presentation was suggestive of UTI, started on antibiotics.  Urine culture showed Enterococcus faecalis, antibiotics changed to oral.  PT/OT recommended skilled facility on discharge.  Medically stable for discharge whenever possible.    Assessment & Plan:  Principal Problem:   Complicated UTI (urinary tract infection) Active Problems:   Acute kidney injury ([place])   Acute metabolic encephalopathy   Current use of long term anticoagulation   Protein calorie malnutrition ([place])   Dementia without behavioral disturbance ([place])   AMS (altered mental status)   UTI: Presented with confusion, lethargic, poor oral intake.  UA suggestive of UTI, started on antibiotics.  Urine culture showed enterococcus faecalis, antibiotics changed to ampicillin.  AKI: Likely in the setting of volume depletion from UTI, poor oral intake.  Kidney function has returned to baseline.  Confusion/acute mild encephalopathy: Suspected secondary to UTI.  Continue current antibiotics.  Delirium precautions.  Frequent reorientation.  Monitor mental status.  His mentation has definitely improved.  He certainly has some degree of dementia and not oriented to time.  History of PE/DVT: On Xarelto  Protein calorie malnourishment: Nutrition consult placed.  History  of dementia: Not formally diagnosed yet but has some degree of underlying dementia.  Delirium precautions.  Frequent reorientation.  Hypotension: Presented with severe hypotension which improved with IV fluids.  Currently blood pressure on the higher range.Started on amlodipine 5 mg daily  Debility/deconditioning/weakness: Currently residing at home, recently discharged back to home from a skilled nursing facility .Pt/OT recommending skilled facility on discharge.  TOC consulted   Nutrition Problem: Increased nutrient needs Etiology: chronic illness    DVT prophylaxis: rivaroxaban (XARELTO) tablet 20 mg     Code Status: DNR  Family Communication: Called and discussed with daughter on phone on 8/13  Patient status:Inpatient  Patient is from :Home  Anticipated discharge to: SnF  Estimated DC date:as soon as bed is available   Consultants: None  Procedures:None  Antimicrobials:  Anti-infectives (From admission, onward)    Start     Dose/Rate Route Frequency Ordered Stop   03/29/22 1115  amoxicillin (AMOXIL) capsule 500 mg        500 mg Oral Every 8 hours 03/29/22 1016 04/03/22 1359   03/27/22 2100  cefTRIAXone (ROCEPHIN) 1 g in sodium chloride 0.9 % 100 mL IVPB  Status:  Discontinued        1 g 200 mL/hr over 30 Minutes Intravenous Every 24 hours 03/27/22 0619 03/29/22 1015   03/26/22 2115  cefTRIAXone (ROCEPHIN) 1 g in sodium chloride 0.9 % 100 mL IVPB        1 g 200 mL/hr over 30 Minutes Intravenous  Once 03/26/22 2107 03/26/22 2234       Subjective:  Patient seen and examined at the bedside this morning.  Hemodynamically stable, sitting in the chair.  Complains of some feeling of cold and was so wrapped with blankets.  Mental status  looks stable  Objective: Vitals:   03/28/22 0613 03/28/22 1353 03/28/22 2058 03/29/22 0524  BP: (!) 158/69 (!) 149/59 (!) 145/67 (!) 152/50  Pulse: 61 (!) 59 (!) 55 (!) 47  Resp: '18 16 16 16  '$ Temp: 98.6 F (37 C) 98.5 F (36.9 C)  98.5 F (36.9 C) 97.7 F (36.5 C)  TempSrc: Oral Oral Oral Oral  SpO2: 99% 96% 94% 100%  Weight:      Height:        Intake/Output Summary (Last 24 hours) at 03/29/2022 1124 Last data filed at 03/29/2022 0726 Gross per 24 hour  Intake 460 ml  Output 1050 ml  Net -590 ml   Filed Weights   03/26/22 1700  Weight: 66.5 kg    Examination:  General exam: Overall comfortable, not in distress, deconditioned, weak HEENT: PERRL Respiratory system:  no wheezes or crackles  Cardiovascular system: S1 & S2 heard, RRR.  Gastrointestinal system: Abdomen is nondistended, soft and nontender. Central nervous system: Alert and awake, oriented to place,follows commands Extremities: No edema, no clubbing ,no cyanosis Skin: No rashes, no ulcers,no icterus     Data Reviewed: I have personally reviewed following labs and imaging studies  CBC: Recent Labs  Lab 03/26/22 1734 03/28/22 0825  WBC 9.6 6.8  NEUTROABS 7.5 4.4  HGB 12.0* 10.4*  HCT 37.5* 33.2*  MCV 89.9 90.5  PLT 254 779   Basic Metabolic Panel: Recent Labs  Lab 03/26/22 1734 03/28/22 0825  NA 137 139  K 4.2 3.7  CL 105 110  CO2 23 22  GLUCOSE 116* 86  BUN 19 16  CREATININE 1.51* 1.09  CALCIUM 9.1 8.7*  MG  --  2.0     Recent Results (from the past 240 hour(s))  Urine Culture     Status: Abnormal   Collection Time: 03/26/22  8:24 PM   Specimen: Urine, Clean Catch  Result Value Ref Range Status   Specimen Description   Final    URINE, CLEAN CATCH Performed at [facility] [address] [place], [place] [address]    Special Requests   Final    NONE Performed at [facility], [place] [address], [place], [place] [address]    Culture 50,000 COLONIES/mL ENTEROCOCCUS FAECALIS (A)  Final   Report Status 03/29/2022 FINAL  Final   Organism ID, Bacteria ENTEROCOCCUS FAECALIS (A)  Final      Susceptibility   Enterococcus faecalis - MIC*    AMPICILLIN <=2 SENSITIVE Sensitive      NITROFURANTOIN <=16 SENSITIVE Sensitive     VANCOMYCIN 1 SENSITIVE Sensitive     * 50,000 COLONIES/mL ENTEROCOCCUS FAECALIS  Culture, blood (Routine X 2) w Reflex to ID Panel     Status: None (Preliminary result)   Collection Time: 03/27/22  8:06 AM   Specimen: BLOOD  Result Value Ref Range Status   Specimen Description   Final    BLOOD BLOOD RIGHT HAND Performed at [facility] [address], [place], [place] [address]    Special Requests   Final    IN PEDIATRIC BOTTLE Blood Culture results may not be optimal due to an inadequate volume of blood received in culture bottles Performed at [facility] [address], [place], [place] [address]    Culture   Final    NO GROWTH 2 DAYS Performed at [facility] [address], [place], [place] [address]    Report Status PENDING  Incomplete  Culture, blood (Routine X 2) w Reflex to ID Panel     Status: None (Preliminary result)   Collection Time: 03/27/22  8:08 AM   Specimen: BLOOD  Result Value Ref Range Status   Specimen Description   Final    BLOOD RIGHT ANTECUBITAL Performed at [facility] [address], [place], [place] [address]    Special Requests   Final    IN PEDIATRIC BOTTLE Blood Culture results may not be optimal due to an inadequate volume of blood received in culture bottles Performed at [facility] [address] [place], [place] [address]    Culture   Final    NO GROWTH 2 DAYS Performed at [facility] [address] [place], [place] [address]    Report Status PENDING  Incomplete  MRSA Next Gen by PCR, Nasal     Status: None   Collection Time: 03/27/22  9:50 AM   Specimen: Nasal Mucosa; Nasal Swab  Result Value Ref Range Status   MRSA by PCR Next Gen NOT DETECTED NOT DETECTED Final    Comment: (NOTE) The GeneXpert MRSA Assay (FDA approved for NASAL specimens only), is one component of a comprehensive MRSA  colonization surveillance program. It is not intended to diagnose MRSA infection nor to guide or monitor treatment for MRSA infections. Test performance is not FDA approved in patients less than 52 years old. Performed at [facility], [place] [address], [place], [place] [address]      Radiology Studies: No results found.  Scheduled Meds:  amoxicillin  500 mg Oral Q8H   feeding supplement  237 mL Oral BID BM   multivitamin with minerals  1 tablet Oral Daily   rivaroxaban  20 mg Oral Q supper   Continuous Infusions:     LOS: 2 days   Shelly Coss, MD [facility] P8/14/2023, 11:24 AM

## 2022-03-30 ENCOUNTER — Ambulatory Visit: Payer: Medicare HMO | Admitting: Cardiovascular Disease

## 2022-03-30 DIAGNOSIS — C61 Malignant neoplasm of prostate: Secondary | ICD-10-CM | POA: Diagnosis not present

## 2022-03-30 DIAGNOSIS — F0390 Unspecified dementia without behavioral disturbance: Secondary | ICD-10-CM | POA: Diagnosis not present

## 2022-03-30 DIAGNOSIS — G9341 Metabolic encephalopathy: Secondary | ICD-10-CM | POA: Diagnosis not present

## 2022-03-30 DIAGNOSIS — Z741 Need for assistance with personal care: Secondary | ICD-10-CM | POA: Diagnosis not present

## 2022-03-30 DIAGNOSIS — N179 Acute kidney failure, unspecified: Secondary | ICD-10-CM | POA: Diagnosis not present

## 2022-03-30 DIAGNOSIS — E860 Dehydration: Secondary | ICD-10-CM | POA: Diagnosis not present

## 2022-03-30 DIAGNOSIS — Z7401 Bed confinement status: Secondary | ICD-10-CM | POA: Diagnosis not present

## 2022-03-30 DIAGNOSIS — Z86711 Personal history of pulmonary embolism: Secondary | ICD-10-CM | POA: Diagnosis not present

## 2022-03-30 DIAGNOSIS — R404 Transient alteration of awareness: Secondary | ICD-10-CM | POA: Diagnosis not present

## 2022-03-30 DIAGNOSIS — Z8546 Personal history of malignant neoplasm of prostate: Secondary | ICD-10-CM | POA: Diagnosis not present

## 2022-03-30 DIAGNOSIS — M6259 Muscle wasting and atrophy, not elsewhere classified, multiple sites: Secondary | ICD-10-CM | POA: Diagnosis not present

## 2022-03-30 DIAGNOSIS — N390 Urinary tract infection, site not specified: Secondary | ICD-10-CM | POA: Diagnosis not present

## 2022-03-30 DIAGNOSIS — E43 Unspecified severe protein-calorie malnutrition: Secondary | ICD-10-CM | POA: Diagnosis not present

## 2022-03-30 DIAGNOSIS — R41841 Cognitive communication deficit: Secondary | ICD-10-CM | POA: Diagnosis not present

## 2022-03-30 DIAGNOSIS — M6281 Muscle weakness (generalized): Secondary | ICD-10-CM | POA: Diagnosis not present

## 2022-03-30 DIAGNOSIS — N3000 Acute cystitis without hematuria: Secondary | ICD-10-CM | POA: Diagnosis not present

## 2022-03-30 DIAGNOSIS — I959 Hypotension, unspecified: Secondary | ICD-10-CM | POA: Diagnosis not present

## 2022-03-30 DIAGNOSIS — R2681 Unsteadiness on feet: Secondary | ICD-10-CM | POA: Diagnosis not present

## 2022-03-30 MED ORDER — AMLODIPINE BESYLATE 5 MG PO TABS: 5.0000 mg | ORAL_TABLET | Freq: Every day | ORAL | Status: DC

## 2022-03-30 MED ORDER — AMOXICILLIN 500 MG PO CAPS: 500.0000 mg | ORAL_CAPSULE | Freq: Three times a day (TID) | ORAL | 0 refills | Status: AC

## 2022-03-30 MED ORDER — POLYETHYLENE GLYCOL 3350 17 G PO PACK: 17.0000 g | PACK | Freq: Every day | ORAL | 0 refills | Status: DC | PRN

## 2022-03-30 NOTE — Plan of Care (Signed)

## 2022-03-30 NOTE — Plan of Care (Signed)

## 2022-03-30 NOTE — TOC Progression Note (Signed)
Transition of Care [facility]) - Progression Note    Patient Details  Name: Connor Morris MRN: [id number] Date of Birth: 1933-12-30  Transition of Care [facility]) CM/SW [place], LCSW Phone Number: 03/30/2022, 10:03 AM  Clinical Narrative:     Josem Kaufmann Pending    Expected Discharge Plan: [place] Barriers to Discharge: Continued Medical Work up, Ship broker, SNF Pending bed offer  Expected Discharge Plan and Services Expected Discharge Plan: [place] In-house Referral: Clinical Social Work   Post Acute Care Choice: [place] Living arrangements for the past 2 months: Single Family Home                                       Social Determinants of Health (SDOH) Interventions    Readmission Risk Interventions    03/10/2022    9:39 AM  Readmission Risk Prevention Plan  Post Dischage Appt Complete  Medication Screening Complete  Transportation Screening Complete

## 2022-03-30 NOTE — Progress Notes (Signed)
Patient being discharged to rehab and transported via EMS. Report called to [facility] at 1445. PIV and tele removed per order. All patient belongings and discharge packet sent with EMS.

## 2022-03-30 NOTE — Discharge Summary (Signed)
Physician Discharge Summary  Connor Morris:[id number] DOB: Jul 17, 1934 DOA: 03/26/2022  PCP: Velna Hatchet, MD  Admit date: 03/26/2022 Discharge date: 03/30/2022  Admitted From: Home Disposition:  Home  Discharge Condition:Stable CODE STATUS:FULL Diet recommendation: regular  Brief/Interim Summary:  Patient is a 88 year old male with history of bladder cancer, prostate cancer, recurrent disease identified by PET following with urology, DVT/PE on Xarelto, thoracic aortic aneurysm who was recently admitted at this hospital for severe generalized weakness secondary to pneumonia and was discharged to skilled nursing facility on 7/27 and was he discharged to home, presented again with complaints of progressive lethargic, confusion, poor oral intake, unable to ambulate.  On presentation he was hypotensive, started on IV fluids.  Urinalysis done on presentation was suggestive of UTI, started on antibiotics.  Urine culture showed Enterococcus faecalis, antibiotics changed to oral.  PT/OT recommended skilled facility on discharge.  Medically stable for discharge .  Following problems were addressed during his hospitalization:  UTI: Presented with confusion, lethargic, poor oral intake.  UA suggestive of UTI, started on antibiotics.  Urine culture showed enterococcus faecalis, antibiotics changed to ampicillin.   AKI: Likely in the setting of volume depletion from UTI, poor oral intake.  Kidney function has returned to baseline.   Confusion/acute mild encephalopathy: Suspected secondary to UTI.  Continue current antibiotics.  Delirium precautions.  Frequent reorientation.  Monitor mental status.  His mentation has definitely improved.  He certainly has some degree of dementia and not oriented to time.   History of PE/DVT: On Xarelto   Protein calorie malnourishment: Nutrition following.   History of dementia: Not formally diagnosed yet but has some degree of underlying dementia.  Delirium  precautions.  Frequent reorientation.   Hypotension: Presented with severe hypotension which improved with IV fluids.  Currently blood pressure on the higher range.Started on amlodipine 5 mg daily   Debility/deconditioning/weakness: Currently residing at home, recently discharged back to home from a skilled nursing facility .Pt/OT recommending skilled facility on discharge.  TOC consulted      Discharge Diagnoses:  Principal Problem:   Complicated UTI (urinary tract infection) Active Problems:   Acute kidney injury ([place])   Acute metabolic encephalopathy   Current use of long term anticoagulation   Protein calorie malnutrition (HCC)   Dementia without behavioral disturbance (HCC)   AMS (altered mental status)    Discharge Instructions  Discharge Instructions     Diet - low sodium heart healthy   Complete by: As directed    Discharge instructions   Complete by: As directed    1)Please take prescribed medications as instructed   Increase activity slowly   Complete by: As directed       Allergies as of 03/30/2022   No Known Allergies      Medication List     TAKE these medications    acetaminophen 325 MG tablet Commonly known as: TYLENOL Take 2 tablets (650 mg total) by mouth every 6 (six) hours as needed for mild pain (or Fever >/= 101).   amLODipine 5 MG tablet Commonly known as: NORVASC Take 1 tablet (5 mg total) by mouth daily. Start taking on: March 31, 2022   amoxicillin 500 MG capsule Commonly known as: AMOXIL Take 1 capsule (500 mg total) by mouth every 8 (eight) hours for 4 days.   feeding supplement Liqd Take 237 mLs by mouth 2 (two) times daily between meals.   latanoprost 0.005 % ophthalmic solution Commonly known as: XALATAN Place 1 drop into the right eye  at bedtime.   multivitamin with minerals tablet Take 1 tablet by mouth daily.   polyethylene glycol 17 g packet Commonly known as: MIRALAX / GLYCOLAX Take 17 g by mouth daily as needed  for mild constipation.   rivaroxaban 20 MG Tabs tablet Commonly known as: XARELTO Take 20 mg by mouth in the morning.        Contact information for after-discharge care     Destination     [facility] Preferred SNF .   Service: Skilled Nursing Contact information: [address] [place] [phone]                    No Known Allergies  Consultations: None   Procedures/Studies: DG Chest 2 View  Result Date: 03/26/2022 CLINICAL DATA:  Weakness EXAM: CHEST - 2 VIEW COMPARISON:  Chest x-ray 03/07/2022 FINDINGS: The heart size and mediastinal contours are within normal limits. There is minimal bibasilar atelectasis. There is no pleural effusion or pneumothorax. No acute fractures are seen. Stable compression deformity of approximate L1. IMPRESSION: No active cardiopulmonary disease. Electronically Signed   By: Ronney Asters M.D.   On: 03/26/2022 18:46   CT Head Wo Contrast  Result Date: 03/26/2022 CLINICAL DATA:  Altered mental status EXAM: CT HEAD WITHOUT CONTRAST TECHNIQUE: Contiguous axial images were obtained from the base of the skull through the vertex without intravenous contrast. RADIATION DOSE REDUCTION: This exam was performed according to the departmental dose-optimization program which includes automated exposure control, adjustment of the mA and/or kV according to patient size and/or use of iterative reconstruction technique. COMPARISON:  01/30/2020 FINDINGS: Brain: No acute intracranial findings are seen. There are no signs of bleeding within the cranium. Cortical sulci are prominent. There is decreased density in periventricular white matter. Vascular: Unremarkable. Skull: Unremarkable. Sinuses/Orbits: There is mucosal thickening in the ethmoid and maxillary sinuses. Other: None. IMPRESSION: No acute intracranial findings are seen in noncontrast CT brain. Atrophy. Small-vessel disease. Mild chronic sinusitis.  Electronically Signed   By: Elmer Picker M.D.   On: 03/26/2022 18:41   DG Chest Port 1 View  Result Date: 03/07/2022 CLINICAL DATA:  Questionable sepsis.  Evaluate for abnormality. EXAM: PORTABLE CHEST 1 VIEW COMPARISON:  01/30/2020 FINDINGS: Aortic atherosclerotic calcifications. Stable cardiomediastinal contours with aortic tortuosity. Lung volumes are low. New hazy opacity is identified within the right lower. Left lung appears clear. No signs of pleural effusion or edema. IMPRESSION: New hazy opacity within the right lower lobe which may early pneumonia. Electronically Signed   By: Kerby Moors M.D.   On: 03/07/2022 10:43      Subjective:  Patient seen and examined at the bedside this morning.  Hemodynamically stable for discharge.   Discharge Exam: Vitals:   03/29/22 2252 03/30/22 0604  BP: (!) 139/59 (!) 135/57  Pulse: (!) 55 (!) 50  Resp: 18 18  Temp: 98.6 F (37 C) 98.4 F (36.9 C)  SpO2: 99% 97%   Vitals:   03/29/22 1205 03/29/22 1441 03/29/22 2252 03/30/22 0604  BP: (!) 114/59 (!) 124/56 (!) 139/59 (!) 135/57  Pulse:  (!) 56 (!) 55 (!) 50  Resp:  '20 18 18  '$ Temp:  98.5 F (36.9 C) 98.6 F (37 C) 98.4 F (36.9 C)  TempSrc:  Oral Oral Oral  SpO2:  97% 99% 97%  Weight:      Height:        General: Pt is alert, awake, not in acute distress Cardiovascular:  RRR, S1/S2 +, no rubs, no gallops Respiratory: CTA bilaterally, no wheezing, no rhonchi Abdominal: Soft, NT, ND, bowel sounds + Extremities: no edema, no cyanosis    The results of significant diagnostics from this hospitalization (including imaging, microbiology, ancillary and laboratory) are listed below for reference.     Microbiology: Recent Results (from the past 240 hour(s))  Urine Culture     Status: Abnormal   Collection Time: 03/26/22  8:24 PM   Specimen: Urine, Clean Catch  Result Value Ref Range Status   Specimen Description   Final    URINE, CLEAN CATCH Performed at [facility], [place] [address] [place], [place] [address]    Special Requests   Final    NONE Performed at [facility], [place] [address], [place], [place] [address]    Culture 50,000 COLONIES/mL ENTEROCOCCUS FAECALIS (A)  Final   Report Status 03/29/2022 FINAL  Final   Organism ID, Bacteria ENTEROCOCCUS FAECALIS (A)  Final      Susceptibility   Enterococcus faecalis - MIC*    AMPICILLIN <=2 SENSITIVE Sensitive     NITROFURANTOIN <=16 SENSITIVE Sensitive     VANCOMYCIN 1 SENSITIVE Sensitive     * 50,000 COLONIES/mL ENTEROCOCCUS FAECALIS  Culture, blood (Routine X 2) w Reflex to ID Panel     Status: None (Preliminary result)   Collection Time: 03/27/22  8:06 AM   Specimen: BLOOD  Result Value Ref Range Status   Specimen Description   Final    BLOOD BLOOD RIGHT HAND Performed at [place] [address], [place], [place] [address]    Special Requests   Final    IN PEDIATRIC BOTTLE Blood Culture results may not be optimal due to an inadequate volume of blood received in culture bottles Performed at [place] [address] [place], [place] [address]    Culture   Final    NO GROWTH 3 DAYS Performed at [facility], [place] [address], [place], [place] [address]    Report Status PENDING  Incomplete  Culture, blood (Routine X 2) w Reflex to ID Panel     Status: None (Preliminary result)   Collection Time: 03/27/22  8:08 AM   Specimen: BLOOD  Result Value Ref Range Status   Specimen Description   Final    BLOOD RIGHT ANTECUBITAL Performed at [place] [address] [place], [place] [address]    Special Requests   Final    IN PEDIATRIC BOTTLE Blood Culture results may not be optimal due to an inadequate volume of blood received in culture bottles Performed at [place] [address] [place], [place] [address]    Culture   Final    NO GROWTH 3 DAYS Performed  at [facility], [place] [address], [place], [place] [address]    Report Status PENDING  Incomplete  MRSA Next Gen by PCR, Nasal     Status: None   Collection Time: 03/27/22  9:50 AM   Specimen: Nasal Mucosa; Nasal Swab  Result Value Ref Range Status   MRSA by PCR Next Gen NOT DETECTED NOT DETECTED Final    Comment: (NOTE) The GeneXpert MRSA Assay (FDA approved for NASAL specimens only), is one component of a comprehensive MRSA colonization surveillance program. It is not intended to diagnose MRSA infection nor to guide or monitor treatment for MRSA infections. Test performance is not FDA approved in patients less than  48 years old. Performed at [facility], [place] [address], [place], [place] [address]      Labs: BNP (last 3 results) No results for input(s): "BNP" in the last 8760 hours. Basic Metabolic Panel: Recent Labs  Lab 03/26/22 1734 03/28/22 0825  NA 137 139  K 4.2 3.7  CL 105 110  CO2 23 22  GLUCOSE 116* 86  BUN 19 16  CREATININE 1.51* 1.09  CALCIUM 9.1 8.7*  MG  --  2.0   Liver Function Tests: Recent Labs  Lab 03/26/22 1734 03/28/22 0825  AST 20 16  ALT 23 16  ALKPHOS 54 45  BILITOT 1.0 0.9  PROT 7.6 6.7  ALBUMIN 3.6 2.9*   No results for input(s): "LIPASE", "AMYLASE" in the last 168 hours. Recent Labs  Lab 03/27/22 0808  AMMONIA 18   CBC: Recent Labs  Lab 03/26/22 1734 03/28/22 0825  WBC 9.6 6.8  NEUTROABS 7.5 4.4  HGB 12.0* 10.4*  HCT 37.5* 33.2*  MCV 89.9 90.5  PLT 254 185   Cardiac Enzymes: No results for input(s): "CKTOTAL", "CKMB", "CKMBINDEX", "TROPONINI" in the last 168 hours. BNP: Invalid input(s): "POCBNP" CBG: No results for input(s): "GLUCAP" in the last 168 hours. D-Dimer No results for input(s): "DDIMER" in the last 72 hours. Hgb A1c No results for input(s): "HGBA1C" in the last 72 hours. Lipid Profile No results for input(s): "CHOL", "HDL", "LDLCALC", "TRIG", "CHOLHDL", "LDLDIRECT" in the last  72 hours. Thyroid function studies No results for input(s): "TSH", "T4TOTAL", "T3FREE", "THYROIDAB" in the last 72 hours.  Invalid input(s): "FREET3" Anemia work up No results for input(s): "VITAMINB12", "FOLATE", "FERRITIN", "TIBC", "IRON", "RETICCTPCT" in the last 72 hours. Urinalysis    Component Value Date/Time   COLORURINE YELLOW 03/26/2022 2024   APPEARANCEUR HAZY (A) 03/26/2022 2024   LABSPEC 1.009 03/26/2022 2024   PHURINE 6.0 03/26/2022 2024   GLUCOSEU NEGATIVE 03/26/2022 2024   HGBUR NEGATIVE 03/26/2022 2024   BILIRUBINUR NEGATIVE 03/26/2022 2024   KETONESUR NEGATIVE 03/26/2022 2024   PROTEINUR NEGATIVE 03/26/2022 2024   UROBILINOGEN 0.2 02/26/2019 1011   NITRITE NEGATIVE 03/26/2022 2024   LEUKOCYTESUR LARGE (A) 03/26/2022 2024   Sepsis Labs Recent Labs  Lab 03/26/22 1734 03/28/22 0825  WBC 9.6 6.8   Microbiology Recent Results (from the past 240 hour(s))  Urine Culture     Status: Abnormal   Collection Time: 03/26/22  8:24 PM   Specimen: Urine, Clean Catch  Result Value Ref Range Status   Specimen Description   Final    URINE, CLEAN CATCH Performed at [facility], [place] [address], [place], [place] [address]    Special Requests   Final    NONE Performed at [facility], [place] [address] [place], [place] [address]    Culture 50,000 COLONIES/mL ENTEROCOCCUS FAECALIS (A)  Final   Report Status 03/29/2022 FINAL  Final   Organism ID, Bacteria ENTEROCOCCUS FAECALIS (A)  Final      Susceptibility   Enterococcus faecalis - MIC*    AMPICILLIN <=2 SENSITIVE Sensitive     NITROFURANTOIN <=16 SENSITIVE Sensitive     VANCOMYCIN 1 SENSITIVE Sensitive     * 50,000 COLONIES/mL ENTEROCOCCUS FAECALIS  Culture, blood (Routine X 2) w Reflex to ID Panel     Status: None (Preliminary result)   Collection Time: 03/27/22  8:06 AM   Specimen: BLOOD  Result Value Ref Range Status   Specimen Description   Final    BLOOD BLOOD RIGHT  HAND Performed at [facility], [place] [address] [place], [place] [address]    Special Requests   Final    IN PEDIATRIC BOTTLE Blood Culture results may not be optimal due to an inadequate volume of blood received in culture bottles Performed at [place] [address], [place], [place] [address]    Culture   Final    NO GROWTH 3 DAYS Performed at [facility], [place] [address] [place], [place] [address]    Report Status PENDING  Incomplete  Culture, blood (Routine X 2) w Reflex to ID Panel     Status: None (Preliminary result)   Collection Time: 03/27/22  8:08 AM   Specimen: BLOOD  Result Value Ref Range Status   Specimen Description   Final    BLOOD RIGHT ANTECUBITAL Performed at [place] [address], [place], [place] [address]    Special Requests   Final    IN PEDIATRIC BOTTLE Blood Culture results may not be optimal due to an inadequate volume of blood received in culture bottles Performed at [place] [address], [place], [place] [address]    Culture   Final    NO GROWTH 3 DAYS Performed at [facility], [place] [address], [place], [place] [address]    Report Status PENDING  Incomplete  MRSA Next Gen by PCR, Nasal     Status: None   Collection Time: 03/27/22  9:50 AM   Specimen: Nasal Mucosa; Nasal Swab  Result Value Ref Range Status   MRSA by PCR Next Gen NOT DETECTED NOT DETECTED Final    Comment: (NOTE) The GeneXpert MRSA Assay (FDA approved for NASAL specimens only), is one component of a comprehensive MRSA colonization surveillance program. It is not intended to diagnose MRSA infection nor to guide or monitor treatment for MRSA infections. Test performance is not FDA approved in patients less than 19 years old. Performed at [facility], [place] [address], [place], [place] [address]     Please note: You were cared for by a hospitalist during  your hospital stay. Once you are discharged, your primary care physician will handle any further medical issues. Please note that NO REFILLS for any discharge medications will be authorized once you are discharged, as it is imperative that you return to your primary care physician (or establish a relationship with a primary care physician if you do not have one) for your post hospital discharge needs so that they can reassess your need for medications and monitor your lab values.    Time coordinating discharge: 40 minutes  SIGNED:   Shelly Coss, MD  [facility] 03/30/2022, 12:39 PM Pager [phone]  If 7PM-7AM, please contact night-coverage [email or web address] Password [email or web address]

## 2022-03-30 NOTE — Consult Note (Signed)
   [facility] CM Inpatient Consult   03/30/2022  Shaughn Tello 07/30/1934 [id number]  [facility] [[facility]]  Accountable Care Organization [ACO] Patient: [employer] Medicare HMO  *[facility] Remote coverage request per Readmission Report for less than 30 days readmission noted  Primary Care Provider:  Holwerda, Scott, MD with [facility], is an embedded provider with a Chronic Care Management team and program, and is listed for the transition of care follow up and appointments.  Patient was screened for Embedded practice service needs for care management  Plan: Patient needs are to be met at a skilled nursing facility level of care for short term rehab.  Please contact for further questions,   , RN BSN CCM [facility] Hospital Liaison  [phone] business mobile phone Toll free office [phone]  Fax number: [phone] [email or web address]    

## 2022-03-30 NOTE — TOC Transition Note (Signed)
Transition of Care [facility]) - CM/SW Discharge Note   Patient Details  Name: Cuauhtemoc Huegel MRN: [id number] Date of Birth: 1934-02-27  Transition of [facility]) CM/SW Contact:  Illene Regulus, LCSW Phone Number: 03/30/2022, 12:54 PM   Clinical Narrative:    Josem Kaufmann has been approved, pt can d/c to [facility] today. CSW will set up EMS transport. CSW spoke with pt's daughter and provided an update. TOC sign off.   Final next level of care: Skilled Nursing Facility Barriers to Discharge: No Barriers Identified   Patient Goals and CMS Choice Patient states their goals for this hospitalization and ongoing recovery are:: SNF CMS Medicare.gov Compare Post Acute Care list provided to:: Patient Choice offered to / list presented to : Patient, Adult Children  Discharge Placement                       Discharge Plan and Services In-house Referral: Clinical Social Work   Post Acute Care Choice: [place]                               Social Determinants of Health (SDOH) Interventions     Readmission Risk Interventions    03/10/2022    9:39 AM  Readmission Risk Prevention Plan  Post Dischage Appt Complete  Medication Screening Complete  Transportation Screening Complete

## 2022-03-30 NOTE — Progress Notes (Signed)
Mobility Specialist - Progress Note  Mobility Specialist Cancellation/Refusal Note:   Reason for Cancellation/Refusal: Pt declined mobility at this time. Pt mentioned feeling tired. Will check back tomorrow.   [facility]

## 2022-03-30 NOTE — Progress Notes (Signed)
PROGRESS NOTE  Connor Morris  HEN:[id number] DOB: 19-Dec-1933 DOA: 03/26/2022 PCP: Velna Hatchet, MD   Brief Narrative:  Patient is a 88 year old male with history of bladder cancer, prostate cancer, recurrent disease identified by PET following with urology, DVT/PE on Xarelto, thoracic aortic aneurysm who was recently admitted at this hospital for severe generalized weakness secondary to pneumonia and was discharged to skilled nursing facility on 7/27 and was he discharged to home, presented again with complaints of progressive lethargic, confusion, poor oral intake, unable to ambulate.  On presentation he was hypotensive, started on IV fluids.  Urinalysis done on presentation was suggestive of UTI, started on antibiotics.  Urine culture showed Enterococcus faecalis, antibiotics changed to oral.  PT/OT recommended skilled facility on discharge.  Medically stable for discharge whenever possible.    Assessment & Plan:  Principal Problem:   Complicated UTI (urinary tract infection) Active Problems:   Acute kidney injury ([place])   Acute metabolic encephalopathy   Current use of long term anticoagulation   Protein calorie malnutrition ([employer])   Dementia without behavioral disturbance ([place])   AMS (altered mental status)   UTI: Presented with confusion, lethargic, poor oral intake.  UA suggestive of UTI, started on antibiotics.  Urine culture showed enterococcus faecalis, antibiotics changed to ampicillin.  AKI: Likely in the setting of volume depletion from UTI, poor oral intake.  Kidney function has returned to baseline.  Confusion/acute mild encephalopathy: Suspected secondary to UTI.  Continue current antibiotics.  Delirium precautions.  Frequent reorientation.  Monitor mental status.  His mentation has definitely improved.  He certainly has some degree of dementia and not oriented to time.  History of PE/DVT: On Xarelto  Protein calorie malnourishment: Nutrition following.  History of  dementia: Not formally diagnosed yet but has some degree of underlying dementia.  Delirium precautions.  Frequent reorientation.  Hypotension: Presented with severe hypotension which improved with IV fluids.  Currently blood pressure on the higher range.Started on amlodipine 5 mg daily  Debility/deconditioning/weakness: Currently residing at home, recently discharged back to home from a skilled nursing facility .Pt/OT recommending skilled facility on discharge.  TOC consulted   Nutrition Problem: Increased nutrient needs Etiology: chronic illness    DVT prophylaxis: rivaroxaban (XARELTO) tablet 20 mg     Code Status: DNR  Family Communication: Called and discussed with daughter on phone on 8/13  Patient status:Inpatient  Patient is from :Home  Anticipated discharge to: Sn[facility]  Estimated DC date:as soon as bed is available   Consultants: None  Procedures:None  Antimicrobials:  Anti-infectives (From admission, onward)    Start     Dose/Rate Route Frequency Ordered Stop   03/29/22 1115  amoxicillin (AMOXIL) capsule 500 mg        500 mg Oral Every 8 hours 03/29/22 1016 04/03/22 1359   03/27/22 2100  cefTRIAXone (ROCEPHIN) 1 g in sodium chloride 0.9 % 100 mL IVPB  Status:  Discontinued        1 g 200 mL/hr over 30 Minutes Intravenous Every 24 hours 03/27/22 0619 03/29/22 1015   03/26/22 2115  cefTRIAXone (ROCEPHIN) 1 g in sodium chloride 0.9 % 100 mL IVPB        1 g 200 mL/hr over 30 Minutes Intravenous  Once 03/26/22 2107 03/26/22 2234       Subjective:  Patient seen and examined at bedside this morning.  Hemodynamically stable, sitting on bed and eating his breakfast.  Denies any complaints, appears very comfortable  Objective: Vitals:   03/29/22 1205 03/29/22  1441 03/29/22 2252 03/30/22 0604  BP: (!) 114/59 (!) 124/56 (!) 139/59 (!) 135/57  Pulse:  (!) 56 (!) 55 (!) 50  Resp:  '20 18 18  '$ Temp:  98.5 F (36.9 C) 98.6 F (37 C) 98.4 F (36.9 C)  TempSrc:  Oral  Oral Oral  SpO2:  97% 99% 97%  Weight:      Height:        Intake/Output Summary (Last 24 hours) at 03/30/2022 1136 Last data filed at 03/30/2022 0900 Gross per 24 hour  Intake 240 ml  Output 1250 ml  Net -1010 ml   Filed Weights   03/26/22 1700  Weight: 66.5 kg    Examination:  General exam: Overall comfortable, not in distress, appears deconditioned HEENT: PERRL Respiratory system:  no wheezes or crackles  Cardiovascular system: S1 & S2 heard, RRR.  Gastrointestinal system: Abdomen is nondistended, soft and nontender. Central nervous system: Alert and awake, oriented to place only, follows commands very well Extremities: No edema, no clubbing ,no cyanosis Skin: No rashes, no ulcers,no icterus     Data Reviewed: I have personally reviewed following labs and imaging studies  CBC: Recent Labs  Lab 03/26/22 1734 03/28/22 0825  WBC 9.6 6.8  NEUTROABS 7.5 4.4  HGB 12.0* 10.4*  HCT 37.5* 33.2*  MCV 89.9 90.5  PLT 254 832   Basic Metabolic Panel: Recent Labs  Lab 03/26/22 1734 03/28/22 0825  NA 137 139  K 4.2 3.7  CL 105 110  CO2 23 22  GLUCOSE 116* 86  BUN 19 16  CREATININE 1.51* 1.09  CALCIUM 9.1 8.7*  MG  --  2.0     Recent Results (from the past 240 hour(s))  Urine Culture     Status: Abnormal   Collection Time: 03/26/22  8:24 PM   Specimen: Urine, Clean Catch  Result Value Ref Range Status   Specimen Description   Final    URINE, CLEAN CATCH Performed at [facility] [address], [place], [place] [address]    Special Requests   Final    NONE Performed at [facility], [place] [address], [place], [place] [address]    Culture 50,000 COLONIES/mL ENTEROCOCCUS FAECALIS (A)  Final   Report Status 03/29/2022 FINAL  Final   Organism ID, Bacteria ENTEROCOCCUS FAECALIS (A)  Final      Susceptibility   Enterococcus faecalis - MIC*    AMPICILLIN <=2 SENSITIVE Sensitive     NITROFURANTOIN <=16 SENSITIVE Sensitive      VANCOMYCIN 1 SENSITIVE Sensitive     * 50,000 COLONIES/mL ENTEROCOCCUS FAECALIS  Culture, blood (Routine X 2) w Reflex to ID Panel     Status: None (Preliminary result)   Collection Time: 03/27/22  8:06 AM   Specimen: BLOOD  Result Value Ref Range Status   Specimen Description   Final    BLOOD BLOOD RIGHT HAND Performed at [facility] [address] [place], [place] [address]    Special Requests   Final    IN PEDIATRIC BOTTLE Blood Culture results may not be optimal due to an inadequate volume of blood received in culture bottles Performed at [facility] [address] [place], [place] [address]    Culture   Final    NO GROWTH 3 DAYS Performed at [facility], [place] [address] [place], [place] [address]    Report Status PENDING  Incomplete  Culture, blood (Routine X 2) w Reflex to  ID Panel     Status: None (Preliminary result)   Collection Time: 03/27/22  8:08 AM   Specimen: BLOOD  Result Value Ref Range Status   Specimen Description   Final    BLOOD RIGHT ANTECUBITAL Performed at [facility] [address] [place], [place] [address]    Special Requests   Final    IN PEDIATRIC BOTTLE Blood Culture results may not be optimal due to an inadequate volume of blood received in culture bottles Performed at [facility] [address], [place], [place] [address]    Culture   Final    NO GROWTH 3 DAYS Performed at [facility], [place] [address] [place], [place] [address]    Report Status PENDING  Incomplete  MRSA Next Gen by PCR, Nasal     Status: None   Collection Time: 03/27/22  9:50 AM   Specimen: Nasal Mucosa; Nasal Swab  Result Value Ref Range Status   MRSA by PCR Next Gen NOT DETECTED NOT DETECTED Final    Comment: (NOTE) The GeneXpert MRSA Assay (FDA approved for NASAL specimens only), is one component of a comprehensive MRSA colonization surveillance program. It is not  intended to diagnose MRSA infection nor to guide or monitor treatment for MRSA infections. Test performance is not FDA approved in patients less than 31 years old. Performed at [facility], [place] [address], [place], [place] [address]      Radiology Studies: No results found.  Scheduled Meds:  amLODipine  5 mg Oral Daily   amoxicillin  500 mg Oral Q8H   feeding supplement  237 mL Oral BID BM   multivitamin with minerals  1 tablet Oral Daily   rivaroxaban  20 mg Oral Q supper   Continuous Infusions:     LOS: 3 days   Shelly Coss, MD [facility] P8/15/2023, 11:36 AM

## 2022-03-31 DIAGNOSIS — N390 Urinary tract infection, site not specified: Secondary | ICD-10-CM | POA: Diagnosis not present

## 2022-03-31 DIAGNOSIS — N179 Acute kidney failure, unspecified: Secondary | ICD-10-CM | POA: Diagnosis not present

## 2022-03-31 DIAGNOSIS — E43 Unspecified severe protein-calorie malnutrition: Secondary | ICD-10-CM | POA: Diagnosis not present

## 2022-03-31 DIAGNOSIS — Z8546 Personal history of malignant neoplasm of prostate: Secondary | ICD-10-CM | POA: Diagnosis not present

## 2022-03-31 DIAGNOSIS — G9341 Metabolic encephalopathy: Secondary | ICD-10-CM | POA: Diagnosis not present

## 2022-03-31 DIAGNOSIS — E860 Dehydration: Secondary | ICD-10-CM | POA: Diagnosis not present

## 2022-03-31 DIAGNOSIS — Z86711 Personal history of pulmonary embolism: Secondary | ICD-10-CM | POA: Diagnosis not present

## 2022-04-01 LAB — CULTURE, BLOOD (ROUTINE X 2)
Culture: NO GROWTH
Culture: NO GROWTH

## 2022-04-02 DIAGNOSIS — N179 Acute kidney failure, unspecified: Secondary | ICD-10-CM | POA: Diagnosis not present

## 2022-04-02 DIAGNOSIS — E43 Unspecified severe protein-calorie malnutrition: Secondary | ICD-10-CM | POA: Diagnosis not present

## 2022-04-02 DIAGNOSIS — Z86711 Personal history of pulmonary embolism: Secondary | ICD-10-CM | POA: Diagnosis not present

## 2022-04-02 DIAGNOSIS — G9341 Metabolic encephalopathy: Secondary | ICD-10-CM | POA: Diagnosis not present

## 2022-04-02 DIAGNOSIS — N390 Urinary tract infection, site not specified: Secondary | ICD-10-CM | POA: Diagnosis not present

## 2022-04-02 DIAGNOSIS — E860 Dehydration: Secondary | ICD-10-CM | POA: Diagnosis not present

## 2022-04-02 DIAGNOSIS — Z8546 Personal history of malignant neoplasm of prostate: Secondary | ICD-10-CM | POA: Diagnosis not present

## 2022-04-06 DIAGNOSIS — Z741 Need for assistance with personal care: Secondary | ICD-10-CM | POA: Diagnosis not present

## 2022-04-06 DIAGNOSIS — F0390 Unspecified dementia without behavioral disturbance: Secondary | ICD-10-CM | POA: Diagnosis not present

## 2022-04-07 DIAGNOSIS — Z86711 Personal history of pulmonary embolism: Secondary | ICD-10-CM | POA: Diagnosis not present

## 2022-04-07 DIAGNOSIS — Z8546 Personal history of malignant neoplasm of prostate: Secondary | ICD-10-CM | POA: Diagnosis not present

## 2022-04-07 DIAGNOSIS — N179 Acute kidney failure, unspecified: Secondary | ICD-10-CM | POA: Diagnosis not present

## 2022-04-07 DIAGNOSIS — N390 Urinary tract infection, site not specified: Secondary | ICD-10-CM | POA: Diagnosis not present

## 2022-04-07 DIAGNOSIS — E43 Unspecified severe protein-calorie malnutrition: Secondary | ICD-10-CM | POA: Diagnosis not present

## 2022-04-07 DIAGNOSIS — E860 Dehydration: Secondary | ICD-10-CM | POA: Diagnosis not present

## 2022-04-07 DIAGNOSIS — G9341 Metabolic encephalopathy: Secondary | ICD-10-CM | POA: Diagnosis not present

## 2022-04-08 DIAGNOSIS — C61 Malignant neoplasm of prostate: Secondary | ICD-10-CM | POA: Diagnosis not present

## 2022-04-14 DIAGNOSIS — E43 Unspecified severe protein-calorie malnutrition: Secondary | ICD-10-CM | POA: Diagnosis not present

## 2022-04-14 DIAGNOSIS — Z9181 History of falling: Secondary | ICD-10-CM | POA: Diagnosis not present

## 2022-04-14 DIAGNOSIS — Z86711 Personal history of pulmonary embolism: Secondary | ICD-10-CM | POA: Diagnosis not present

## 2022-04-14 DIAGNOSIS — G9341 Metabolic encephalopathy: Secondary | ICD-10-CM | POA: Diagnosis not present

## 2022-04-14 DIAGNOSIS — Z7901 Long term (current) use of anticoagulants: Secondary | ICD-10-CM | POA: Diagnosis not present

## 2022-04-14 DIAGNOSIS — E860 Dehydration: Secondary | ICD-10-CM | POA: Diagnosis not present

## 2022-04-14 DIAGNOSIS — I7121 Aneurysm of the ascending aorta, without rupture: Secondary | ICD-10-CM | POA: Diagnosis not present

## 2022-04-14 DIAGNOSIS — R1310 Dysphagia, unspecified: Secondary | ICD-10-CM | POA: Diagnosis not present

## 2022-04-14 DIAGNOSIS — Z86718 Personal history of other venous thrombosis and embolism: Secondary | ICD-10-CM | POA: Diagnosis not present

## 2022-04-23 DIAGNOSIS — E46 Unspecified protein-calorie malnutrition: Secondary | ICD-10-CM | POA: Diagnosis not present

## 2022-04-23 DIAGNOSIS — N179 Acute kidney failure, unspecified: Secondary | ICD-10-CM | POA: Diagnosis not present

## 2022-04-23 DIAGNOSIS — C679 Malignant neoplasm of bladder, unspecified: Secondary | ICD-10-CM | POA: Diagnosis not present

## 2022-04-23 DIAGNOSIS — R5381 Other malaise: Secondary | ICD-10-CM | POA: Diagnosis not present

## 2022-04-23 DIAGNOSIS — I2699 Other pulmonary embolism without acute cor pulmonale: Secondary | ICD-10-CM | POA: Diagnosis not present

## 2022-04-23 DIAGNOSIS — G9340 Encephalopathy, unspecified: Secondary | ICD-10-CM | POA: Diagnosis not present

## 2022-04-23 DIAGNOSIS — I959 Hypotension, unspecified: Secondary | ICD-10-CM | POA: Diagnosis not present

## 2022-04-23 DIAGNOSIS — N390 Urinary tract infection, site not specified: Secondary | ICD-10-CM | POA: Diagnosis not present

## 2022-04-23 DIAGNOSIS — C61 Malignant neoplasm of prostate: Secondary | ICD-10-CM | POA: Diagnosis not present

## 2022-05-30 ENCOUNTER — Encounter: Payer: Self-pay | Admitting: Cardiovascular Disease

## 2022-05-30 NOTE — Progress Notes (Unsigned)
Cardiology Office Note   Date:  05/31/2022   ID:  Connor Morris, DOB [birth year], MRN [id number]  PCP:  Velna Hatchet, MD  Cardiologist:   Mertie Moores, MD   Chief Complaint  Patient presents with   aortic aneurims        Connor Morris is a 88 y.o. male who presents for Evaluation of his bradycardia.  Seen for the first time , request from [facility])  No hx of syncope  No CP or dyspnea   Retired from the [employer] - [employer].  Goes to the [facility] - sometimes 5 days a week .  No CP or dyspnea   Dec 14, 2016:  Doing well.  Seen with Connor Morris, daughter.   Occasional dizzy spells - sound like orthostatic hypotension. Also has occasional dizzy spell after walking around the track.  Has occurred 1-2 times this month  Nov. 6 2018:    Doing well ,  No syncope  Felt dizzy walking around the track .  No CP or dyspnea   Jan 02, 2018: Mr. Snellings He also has a history of hyperlipidemia.  Seen today for follow-up of his bradycardia. No syncope or presyncope ,  Had a remote episode of dizziness  April 13, 2021: seen with Connor Morris ( son) Mr. Matuszak is seen today for follow-up visit.  I saw him last in May 2019 for asymptomatic bradycardia.   He is was seen by Richardson Dopp,   PA last year.  His heart rate is much better today.  Feeling well.   Oct. 16, 2023  Seen with daughter,  Connor Morris .    Connor Morris is seen for follow up of his bradycardia , Has a 4.6 cm ascending throacic aorta ( followed in the past  by Dr. Cyndia Bent) , hx of pulmonary embolus  - is on xarelto   No episodes of syncope  Went to the ER in July Was found to have aspiration pneumonia   Has UTIs occasionally  - caused hypotension Still getting PT  Doing some exercises  Working on strength and balance      Past Medical History:  Diagnosis Date   1st degree AV block    AAA (abdominal aortic aneurysm) ([place]) 03/2018   followed by dr Cyndia Bent--- last CTA  4.6cm   Anticoagulated  03/2018   xarelto for hx pe/ dvt--- managed by pcp   Arthritis    Cancer ([place])    Emphysema lung ([place])    Hiatal hernia    History of bladder cancer    urologist--- dr Milford Cage   s/p TURBT feb and march 2021   History of DVT of lower extremity 03/2018   LLE   History of prostate cancer 2002   s/p radioactive seed implants 04-11-2001   History of pulmonary embolus (PE) 03/2018   left side--  on xarelto   History of sepsis 01/30/2020   hospital admission in epic;  due to UTI   Impaired memory    Retained ureteral stent    right side   Sinus bradycardia 2018   cardiologist--- dr Cathie Olden   Urgency of urination     Past Surgical History:  Procedure Laterality Date   CATARACT EXTRACTION W/ INTRAOCULAR LENS IMPLANT Right 2020   CYSTOSCOPY N/A 10/30/2019   Procedure: CYSTOSCOPY;  Surgeon: Cleon Gustin, MD;  Location: [facility];  Service: Urology;  Laterality: N/A;   CYSTOSCOPY W/ RETROGRADES Bilateral 09/20/2019   Procedure: CYSTOSCOPY WITH RETROGRADE PYELOGRAM;  Surgeon: Cleon Gustin, MD;  Location: [facility];  Service: Urology;  Laterality: Bilateral;   CYSTOSCOPY W/ URETERAL STENT REMOVAL N/A 03/25/2020   Procedure: CYSTOSCOPY WITH  REMOVAL OF RETAINED FOLEY CATHETER,  FULGERATION;  Surgeon: Remi Haggard, MD;  Location: [facility];  Service: Urology;  Laterality: N/A;   FOOT SURGERY  yrs ago   RADIOACTIVE PROSTATE SEED IMPLANTS  04-11-2001  dr Janice Norrie  '@[email or web address]'$    TRANSURETHRAL RESECTION OF BLADDER TUMOR N/A 09/20/2019   Procedure: TRANSURETHRAL RESECTION OF BLADDER TUMOR (TURBT);  Surgeon: Cleon Gustin, MD;  Location: [facility];  Service: Urology;  Laterality: N/A;  1 HR   TRANSURETHRAL RESECTION OF BLADDER TUMOR N/A 10/30/2019   Procedure: TRANSURETHRAL RESECTION OF BLADDER TUMOR (TURBT), REMOVAL OF RIGHT STENT;  Surgeon: Cleon Gustin, MD;  Location: [facility];  Service: Urology;   Laterality: N/A;     Current Outpatient Medications  Medication Sig Dispense Refill   acetaminophen (TYLENOL) 325 MG tablet Take 2 tablets (650 mg total) by mouth every 6 (six) hours as needed for mild pain (or Fever >/= 101). 20 tablet    feeding supplement (ENSURE ENLIVE / ENSURE PLUS) LIQD Take 237 mLs by mouth 2 (two) times daily between meals. 237 mL 12   latanoprost (XALATAN) 0.005 % ophthalmic solution Place 1 drop into the right eye at bedtime.     Multiple Vitamins-Minerals (MULTIVITAMIN WITH MINERALS) tablet Take 1 tablet by mouth daily.      rivaroxaban (XARELTO) 20 MG TABS tablet Take 20 mg by mouth in the morning.     No current facility-administered medications for this visit.    Allergies:   Patient has no known allergies.    Social History:  The patient  reports that he quit smoking about 36 years ago. His smoking use included cigarettes. He has a 20.00 pack-year smoking history. He has never used smokeless tobacco. He reports that he does not drink alcohol and does not use drugs.   Family History:  The patient's family history includes Diabetes in his sister; Healthy in his father; Hypertension in his mother.    ROS:  Please see the history of present illness.    Review of Systems: Noted in current history, otherwise review of systems is negative.   Physical Exam: Blood pressure 128/60, pulse (!) 47, height '5\' 9"'$  (1.753 m), weight 153 lb 12.8 oz (69.8 kg), SpO2 92 %.       GEN:  elderly , frail man,  in no acute distress HEENT: Normal NECK: No JVD; No carotid bruits LYMPHATICS: No lymphadenopathy CARDIAC: RRR , no murmurs, rubs, gallops RESPIRATORY:  Clear to auscultation without rales, wheezing or rhonchi  ABDOMEN:  + pulsatile mid abdominal mass.   MUSCULOSKELETAL:  No edema; No deformity  SKIN: Warm and dry NEUROLOGIC:  Alert and oriented x 3     EKG:     Recent Labs: 03/27/2022: TSH 1.253 03/28/2022: ALT 16; BUN 16; Creatinine, Ser 1.09;  Hemoglobin 10.4; Magnesium 2.0; Platelets 185; Potassium 3.7; Sodium 139    Lipid Panel No results found for: "CHOL", "TRIG", "HDL", "CHOLHDL", "VLDL", "LDLCALC", "LDLDIRECT"    Wt Readings from Last 3 Encounters:  05/31/22 153 lb 12.8 oz (69.8 kg)  03/26/22 146 lb 9.7 oz (66.5 kg)  03/08/22 146 lb 9.7 oz (66.5 kg)      Other studies Reviewed: Additional studies/ records that were reviewed today include: . Review of the above records demonstrates:  ASSESSMENT AND PLAN:  1.  Asymptomatic sinus bradycardia:    no symptoms at this point   Continue to follow   2.  Thoracic aortic aneurysm: He has a fusiform enlargement of his thoracic aorta measuring 4.6 cm.  He has been followed in the past by Dr. Cyndia Bent.  Given his advanced age and frail state, Dr. Cyndia Bent thought that he probably was not a very good surgical candidate.  We will continue to work on keeping his blood pressure well controlled but I agree that he does not need close follow-up from CV surgery at this time.   3.  Hypertension: Blood pressure has been well controlled.  He is now off amlodipine because of an episode of hypotension.  We will continue on no antihypertensives at this time.   Current medicines are reviewed at length with the patient today.  The patient does not have concerns regarding medicines.  Labs/ tests ordered today include:   No orders of the defined types were placed in this encounter.        Mertie Moores, MD  05/31/2022 5:42 PM    [facility] [place], [place], [place]  [address] Phone: [phone]; Fax: [phone]

## 2022-05-31 ENCOUNTER — Ambulatory Visit: Payer: Medicare HMO | Attending: Cardiovascular Disease | Admitting: Cardiovascular Disease

## 2022-05-31 ENCOUNTER — Encounter: Payer: Self-pay | Admitting: Cardiovascular Disease

## 2022-05-31 VITALS — BP 128/60 | HR 47 | Ht 69.0 in | Wt 153.8 lb

## 2022-05-31 DIAGNOSIS — R001 Bradycardia, unspecified: Secondary | ICD-10-CM | POA: Diagnosis not present

## 2022-05-31 DIAGNOSIS — I7121 Aneurysm of the ascending aorta, without rupture: Secondary | ICD-10-CM

## 2022-05-31 NOTE — Patient Instructions (Signed)
Medication Instructions:   *If you need a refill on your cardiac medications before your next appointment, please call your pharmacy*   Lab Work:  If you have labs (blood work) drawn today and your tests are completely normal, you will receive your results only by: [place] (if you have MyChart) OR A paper copy in the mail If you have any lab test that is abnormal or we need to change your treatment, we will call you to review the results.   Testing/Procedures:    Follow-Up: At [facility], you and your health needs are our priority.  As part of our continuing mission to provide you with exceptional heart care, we have created designated Provider Care Teams.  These Care Teams include your primary Cardiologist (physician) and Advanced Practice Providers (APPs -  Physician Assistants and Nurse Practitioners) who all work together to provide you with the care you need, when you need it.  We recommend signing up for the patient portal called "MyChart".  Sign up information is provided on this After Visit Summary.  MyChart is used to connect with patients for Virtual Visits (Telemedicine).  Patients are able to view lab/test results, encounter notes, upcoming appointments, etc.  Non-urgent messages can be sent to your provider as well.   To learn more about what you can do with MyChart, go to [email or web address].    Your next appointment:   1 year(s)  The format for your next appointment:   In Person  Provider:   Mertie Moores, MD     Other Instructions   Important Information About Sugar

## 2022-06-04 ENCOUNTER — Telehealth (HOSPITAL_COMMUNITY): Payer: Self-pay

## 2022-06-04 NOTE — Telephone Encounter (Signed)
Daughter of patient called to ask if we can call insurance as [employer] requires pre-approval. I informed daughter that we do not handle insurance and she will need to give [facility] a call. I gave daughter the codes for Modified Barium Swallow. Daughter will call back to schedule once she talks with insurance company. Will continue to follow.

## 2022-06-14 ENCOUNTER — Other Ambulatory Visit: Payer: Self-pay | Admitting: Internal Medicine

## 2022-06-14 ENCOUNTER — Other Ambulatory Visit (HOSPITAL_COMMUNITY): Payer: Self-pay | Admitting: Internal Medicine

## 2022-06-14 DIAGNOSIS — R1310 Dysphagia, unspecified: Secondary | ICD-10-CM

## 2022-06-17 ENCOUNTER — Other Ambulatory Visit (HOSPITAL_COMMUNITY): Payer: Self-pay

## 2022-06-17 ENCOUNTER — Telehealth (HOSPITAL_COMMUNITY): Payer: Self-pay

## 2022-06-17 DIAGNOSIS — R1310 Dysphagia, unspecified: Secondary | ICD-10-CM

## 2022-06-17 NOTE — Telephone Encounter (Signed)
Attempted to contact daughter of patient to schedule Modified Barium Swallow - left voicemail.

## 2022-06-22 ENCOUNTER — Ambulatory Visit (HOSPITAL_COMMUNITY)
Admission: RE | Admit: 2022-06-22 | Discharge: 2022-06-22 | Disposition: A | Discharge status: Home / Self Care | Payer: Medicare HMO | Source: Ambulatory Visit | Attending: Internal Medicine | Admitting: Internal Medicine

## 2022-06-22 DIAGNOSIS — Z125 Encounter for screening for malignant neoplasm of prostate: Secondary | ICD-10-CM | POA: Diagnosis not present

## 2022-06-22 DIAGNOSIS — E785 Hyperlipidemia, unspecified: Secondary | ICD-10-CM | POA: Diagnosis not present

## 2022-06-22 DIAGNOSIS — R1310 Dysphagia, unspecified: Secondary | ICD-10-CM

## 2022-06-22 DIAGNOSIS — Z8551 Personal history of malignant neoplasm of bladder: Secondary | ICD-10-CM | POA: Insufficient documentation

## 2022-06-22 DIAGNOSIS — Z8546 Personal history of malignant neoplasm of prostate: Secondary | ICD-10-CM | POA: Diagnosis not present

## 2022-06-22 DIAGNOSIS — R1312 Dysphagia, oropharyngeal phase: Secondary | ICD-10-CM | POA: Insufficient documentation

## 2022-06-22 DIAGNOSIS — R059 Cough, unspecified: Secondary | ICD-10-CM | POA: Diagnosis present

## 2022-06-22 DIAGNOSIS — R82998 Other abnormal findings in urine: Secondary | ICD-10-CM | POA: Diagnosis not present

## 2022-06-22 DIAGNOSIS — R7989 Other specified abnormal findings of blood chemistry: Secondary | ICD-10-CM | POA: Diagnosis not present

## 2022-06-22 DIAGNOSIS — E559 Vitamin D deficiency, unspecified: Secondary | ICD-10-CM | POA: Diagnosis not present

## 2022-06-22 DIAGNOSIS — I10 Essential (primary) hypertension: Secondary | ICD-10-CM | POA: Diagnosis not present

## 2022-06-29 ENCOUNTER — Other Ambulatory Visit (HOSPITAL_COMMUNITY): Payer: Medicare HMO

## 2022-06-29 DIAGNOSIS — Z1339 Encounter for screening examination for other mental health and behavioral disorders: Secondary | ICD-10-CM | POA: Diagnosis not present

## 2022-06-29 DIAGNOSIS — J439 Emphysema, unspecified: Secondary | ICD-10-CM | POA: Diagnosis not present

## 2022-06-29 DIAGNOSIS — C61 Malignant neoplasm of prostate: Secondary | ICD-10-CM | POA: Diagnosis not present

## 2022-06-29 DIAGNOSIS — Z23 Encounter for immunization: Secondary | ICD-10-CM | POA: Diagnosis not present

## 2022-06-29 DIAGNOSIS — I951 Orthostatic hypotension: Secondary | ICD-10-CM | POA: Diagnosis not present

## 2022-06-29 DIAGNOSIS — Z0000 Encounter for general adult medical examination without abnormal findings: Secondary | ICD-10-CM | POA: Diagnosis not present

## 2022-06-29 DIAGNOSIS — E46 Unspecified protein-calorie malnutrition: Secondary | ICD-10-CM | POA: Diagnosis not present

## 2022-06-29 DIAGNOSIS — I10 Essential (primary) hypertension: Secondary | ICD-10-CM | POA: Diagnosis not present

## 2022-06-29 DIAGNOSIS — Z1331 Encounter for screening for depression: Secondary | ICD-10-CM | POA: Diagnosis not present

## 2022-06-29 DIAGNOSIS — R1312 Dysphagia, oropharyngeal phase: Secondary | ICD-10-CM | POA: Diagnosis not present

## 2022-06-29 DIAGNOSIS — I719 Aortic aneurysm of unspecified site, without rupture: Secondary | ICD-10-CM | POA: Diagnosis not present

## 2022-06-29 DIAGNOSIS — I700 Atherosclerosis of aorta: Secondary | ICD-10-CM | POA: Diagnosis not present

## 2022-06-30 ENCOUNTER — Other Ambulatory Visit (HOSPITAL_COMMUNITY): Payer: Medicare HMO

## 2022-06-30 ENCOUNTER — Encounter (HOSPITAL_COMMUNITY): Payer: Self-pay

## 2022-07-06 DIAGNOSIS — H401131 Primary open-angle glaucoma, bilateral, mild stage: Secondary | ICD-10-CM | POA: Diagnosis not present

## 2022-07-06 DIAGNOSIS — H2512 Age-related nuclear cataract, left eye: Secondary | ICD-10-CM | POA: Diagnosis not present

## 2022-07-07 ENCOUNTER — Other Ambulatory Visit: Payer: Self-pay | Admitting: Surgery

## 2022-07-07 DIAGNOSIS — I712 Thoracic aortic aneurysm, without rupture, unspecified: Secondary | ICD-10-CM

## 2022-08-04 ENCOUNTER — Encounter: Payer: Self-pay | Admitting: Surgery

## 2022-08-04 ENCOUNTER — Ambulatory Visit (INDEPENDENT_AMBULATORY_CARE_PROVIDER_SITE_OTHER): Payer: Medicare HMO | Admitting: Surgery

## 2022-08-04 ENCOUNTER — Ambulatory Visit
Admission: RE | Admit: 2022-08-04 | Discharge: 2022-08-04 | Disposition: A | Discharge status: Home / Self Care | Payer: Medicare HMO | Source: Ambulatory Visit | Attending: Surgery | Admitting: Surgery

## 2022-08-04 VITALS — BP 160/68 | HR 60 | Resp 20 | Ht 69.0 in | Wt 156.0 lb

## 2022-08-04 DIAGNOSIS — I712 Thoracic aortic aneurysm, without rupture, unspecified: Secondary | ICD-10-CM

## 2022-08-04 DIAGNOSIS — I7121 Aneurysm of the ascending aorta, without rupture: Secondary | ICD-10-CM

## 2022-08-04 DIAGNOSIS — R1310 Dysphagia, unspecified: Secondary | ICD-10-CM | POA: Insufficient documentation

## 2022-08-04 DIAGNOSIS — G9340 Encephalopathy, unspecified: Secondary | ICD-10-CM | POA: Insufficient documentation

## 2022-08-04 DIAGNOSIS — J432 Centrilobular emphysema: Secondary | ICD-10-CM | POA: Diagnosis not present

## 2022-08-04 DIAGNOSIS — J439 Emphysema, unspecified: Secondary | ICD-10-CM | POA: Insufficient documentation

## 2022-08-04 DIAGNOSIS — I10 Essential (primary) hypertension: Secondary | ICD-10-CM | POA: Insufficient documentation

## 2022-08-04 DIAGNOSIS — I700 Atherosclerosis of aorta: Secondary | ICD-10-CM | POA: Insufficient documentation

## 2022-08-04 DIAGNOSIS — I959 Hypotension, unspecified: Secondary | ICD-10-CM | POA: Insufficient documentation

## 2022-08-04 DIAGNOSIS — K449 Diaphragmatic hernia without obstruction or gangrene: Secondary | ICD-10-CM | POA: Diagnosis not present

## 2022-08-04 DIAGNOSIS — I2510 Atherosclerotic heart disease of native coronary artery without angina pectoris: Secondary | ICD-10-CM | POA: Diagnosis not present

## 2022-08-04 MED ORDER — IOPAMIDOL (ISOVUE-370) INJECTION 76%
75.0000 mL | Freq: Once | INTRAVENOUS | Status: AC | PRN
  Administered 2022-08-04: 75 mL via INTRAVENOUS

## 2022-08-04 NOTE — Progress Notes (Signed)
HPI:  The patient is an 88 year old gentleman who returns for follow-up of a 4.6 cm fusiform ascending aortic aneurysm diagnosed on CTA of the chest in August 2019 after he presented with left lower extremity DVT and PE.  I last saw him in 07/2020 and he was still recovering from bladder cancer surgery at that time.  He is here today with his daughter.  He has not been very active at home and is using a cane to prevent falling.  He denies any chest pain or pressure.  Denies any shortness of breath.  Current Outpatient Medications  Medication Sig Dispense Refill   acetaminophen (TYLENOL) 325 MG tablet Take 2 tablets (650 mg total) by mouth every 6 (six) hours as needed for mild pain (or Fever >/= 101). 20 tablet    amLODipine (NORVASC) 5 MG tablet 1 tablet Orally Once a day for 90 days     feeding supplement (ENSURE ENLIVE / ENSURE PLUS) LIQD Take 237 mLs by mouth 2 (two) times daily between meals. 237 mL 12   latanoprost (XALATAN) 0.005 % ophthalmic solution Place 1 drop into the right eye at bedtime.     Multiple Vitamins-Minerals (MULTIVITAMIN WITH MINERALS) tablet Take 1 tablet by mouth daily.      rivaroxaban (XARELTO) 20 MG TABS tablet Take 20 mg by mouth in the morning.     No current facility-administered medications for this visit.     Physical Exam: BP (!) 160/68 (BP Location: Left Arm, Patient Position: Sitting)   Pulse 60   Resp 20   Ht '5\' 9"'$  (1.753 m)   Wt 156 lb (70.8 kg)   SpO2 95% Comment: RA  BMI 23.04 kg/m  She is a frail elderly gentleman in no distress. Cardiac exam shows a regular rate and rhythm with normal heart sounds.  There is no murmur. Lungs are clear. There is no peripheral edema.  Diagnostic Tests:  Narrative & Impression  CLINICAL DATA:  Thoracic aortic aneurysm follow-up.   EXAM: CT ANGIOGRAPHY CHEST WITH CONTRAST   TECHNIQUE: Multidetector CT imaging of the chest was performed using the standard protocol during bolus administration of  intravenous contrast. Multiplanar CT image reconstructions and MIPs were obtained to evaluate the vascular anatomy.   RADIATION DOSE REDUCTION: This exam was performed according to the departmental dose-optimization program which includes automated exposure control, adjustment of the mA and/or kV according to patient size and/or use of iterative reconstruction technique.   CONTRAST:  43m ISOVUE-370 IOPAMIDOL (ISOVUE-370) INJECTION 76%   COMPARISON:  CTA chest dated August 06, 2020.   FINDINGS: Cardiovascular: Preferential opacification of the thoracic aorta. Accurate measurement is limited by motion artifact, but the ascending thoracic aortic aneurysm is grossly stable, measuring up to 4.6 cm in diameter. Coronary, aortic arch, and branch vessel atherosclerotic vascular disease. 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 Unchanged irregular noncalcified atherosclerotic plaque at the origin of the brachiocephalic artery without significant stenosis. Normal heart size. No pericardial effusion. No central pulmonary embolism.   Mediastinum/Nodes: No enlarged mediastinal, hilar, or axillary lymph nodes. Thyroid gland, trachea, and esophagus demonstrate no significant findings. Unchanged small hiatal hernia.   Lungs/Pleura: Unchanged mild-to-moderate centrilobular emphysema. No focal consolidation, pleural effusion, or pneumothorax.   Upper Abdomen: No acute abnormality.   Musculoskeletal: No chest wall abnormality. No acute or significant osseous findings. Chronic L1 and L2 superior endplate compression deformities again noted.   Review of the MIP  images confirms the above findings.   IMPRESSION: 1. Grossly stable ascending thoracic aortic aneurysm, measuring up to 4.6 cm in diameter. Continued semi-annual imaging followup by CTA or MRA is recommended. 2. Aortic Atherosclerosis  (ICD10-I70.0) and Emphysema (ICD10-J43.9).     Electronically Signed   By: Titus Dubin M.D.   On: 08/04/2022 12:51      Impression:  This 88 year old gentleman has a stable 4.6 cm fusiform ascending aortic aneurysm.  This is not changed since he was first diagnosed in August 2019.  It is well below the surgical threshold of 5.5 cm.  Given his advanced age and frailty I do not think he would be a candidate for surgical treatment even if it increased in size to the surgical threshold.  I reviewed the CTA images with him and his daughter and answered their questions.  I told them that I did not think further follow-up scans were necessary since it is not going to change the treatment.  I recommended continued good blood pressure control in preventing further enlargement and acute aortic dissection.  Plan:  He will continue to follow-up with his PCP for control of his hypertension.  I will be happy to see him back if the need arises but I do not think further CT scans of the chest are necessary for treatment in his case is going to be nonsurgical with blood pressure control.  I spent 15 minutes performing this established patient evaluation and > 50% of this time was spent face to face counseling and coordinating the care of this patient's aortic aneurysm.    Gaye Pollack, MD [facility] [phone]

## 2022-10-06 DIAGNOSIS — C672 Malignant neoplasm of lateral wall of bladder: Secondary | ICD-10-CM | POA: Diagnosis not present

## 2022-10-06 DIAGNOSIS — C61 Malignant neoplasm of prostate: Secondary | ICD-10-CM | POA: Diagnosis not present

## 2023-01-17 DIAGNOSIS — H2000 Unspecified acute and subacute iridocyclitis: Secondary | ICD-10-CM | POA: Diagnosis not present

## 2023-01-17 DIAGNOSIS — H5213 Myopia, bilateral: Secondary | ICD-10-CM | POA: Diagnosis not present

## 2023-01-17 DIAGNOSIS — H2513 Age-related nuclear cataract, bilateral: Secondary | ICD-10-CM | POA: Diagnosis not present

## 2023-01-21 DIAGNOSIS — J439 Emphysema, unspecified: Secondary | ICD-10-CM | POA: Diagnosis not present

## 2023-01-21 DIAGNOSIS — D692 Other nonthrombocytopenic purpura: Secondary | ICD-10-CM | POA: Diagnosis not present

## 2023-01-21 DIAGNOSIS — D6869 Other thrombophilia: Secondary | ICD-10-CM | POA: Diagnosis not present

## 2023-01-21 DIAGNOSIS — I700 Atherosclerosis of aorta: Secondary | ICD-10-CM | POA: Diagnosis not present

## 2023-01-21 DIAGNOSIS — C61 Malignant neoplasm of prostate: Secondary | ICD-10-CM | POA: Diagnosis not present

## 2023-01-21 DIAGNOSIS — E46 Unspecified protein-calorie malnutrition: Secondary | ICD-10-CM | POA: Diagnosis not present

## 2023-01-21 DIAGNOSIS — I10 Essential (primary) hypertension: Secondary | ICD-10-CM | POA: Diagnosis not present

## 2023-01-21 DIAGNOSIS — B351 Tinea unguium: Secondary | ICD-10-CM | POA: Diagnosis not present

## 2023-01-21 DIAGNOSIS — Z86718 Personal history of other venous thrombosis and embolism: Secondary | ICD-10-CM | POA: Diagnosis not present

## 2023-01-27 DIAGNOSIS — H2000 Unspecified acute and subacute iridocyclitis: Secondary | ICD-10-CM | POA: Diagnosis not present

## 2023-02-11 DIAGNOSIS — H2000 Unspecified acute and subacute iridocyclitis: Secondary | ICD-10-CM | POA: Diagnosis not present

## 2023-03-01 DIAGNOSIS — H2000 Unspecified acute and subacute iridocyclitis: Secondary | ICD-10-CM | POA: Diagnosis not present

## 2023-03-28 DIAGNOSIS — H2512 Age-related nuclear cataract, left eye: Secondary | ICD-10-CM | POA: Diagnosis not present

## 2023-04-04 DIAGNOSIS — C61 Malignant neoplasm of prostate: Secondary | ICD-10-CM | POA: Diagnosis not present

## 2023-04-14 DIAGNOSIS — N35013 Post-traumatic anterior urethral stricture: Secondary | ICD-10-CM | POA: Diagnosis not present

## 2023-04-14 DIAGNOSIS — C672 Malignant neoplasm of lateral wall of bladder: Secondary | ICD-10-CM | POA: Diagnosis not present

## 2023-04-14 DIAGNOSIS — C61 Malignant neoplasm of prostate: Secondary | ICD-10-CM | POA: Diagnosis not present

## 2023-05-04 ENCOUNTER — Telehealth: Payer: Self-pay | Admitting: Cardiovascular Disease

## 2023-05-04 NOTE — Telephone Encounter (Signed)
Spoke with pt's daughter Vernona Rieger after pt provided verbal permission,  Pt's daughter states pt was to have cataract surgery this morning and it was canceled due to pt's heart rate of 34.  Pt's daughter reports pt is currently asymptomatic.  She does not have a way to check BP or HR at this time.  Pt has known sinus bradycardia and is due to be seen. Appointment scheduled with Cristi Loron for 05/05/2023 at 335pm.  Reviewed ED precautions.  Monitor BP and HR if possible.  Pt's daughter verbalizes understanding and agrees with current plan.

## 2023-05-04 NOTE — Progress Notes (Unsigned)
Cardiology Office Note    Patient Name: Connor Morris Date of Encounter: 05/04/2023  Primary Care Provider:  Alysia Penna, MD Primary Cardiologist:  Connor Miss, MD Primary Electrophysiologist: None   Past Medical History    Past Medical History:  Diagnosis Date   1st degree AV block    AAA (abdominal aortic aneurysm) (HCC) 03/2018   followed by dr Connor Morris--- last CTA  4.6cm   Anticoagulated 03/2018   xarelto for hx pe/ dvt--- managed by pcp   Arthritis    Cancer (HCC)    Emphysema lung (HCC)    Hiatal hernia    History of bladder cancer    urologist--- dr Connor Morris   s/p TURBT feb and march 2021   History of DVT of lower extremity 03/2018   LLE   History of prostate cancer 2002   s/p radioactive seed implants 04-11-2001   History of pulmonary embolus (PE) 03/2018   left side--  on xarelto   History of sepsis 01/30/2020   hospital admission in epic;  due to UTI   Impaired memory    Retained ureteral stent    right side   Sinus bradycardia 2018   cardiologist--- dr Connor Morris   Urgency of urination     History of Present Illness  Connor Morris is a 89 y.o. male with a PMH of thoracic aneurysm, HLD, bradycardia, first-degree AVB, HLD, prostate CA s/p radiation seeds, left lower extremity DVT/PE 2019 (on Xarelto), who presents today for bradycardia.  Connor Morris has been followed by Dr. Elease Morris since 2018 for evaluation of bradycardia.  He was having cataract surgery completed yesterday and heart rate was noted to be 34 and procedure was canceled.  He continues to be asymptomatic with bradycardia as noted in the past.  He was last seen by Dr. Elease Morris on 05/31/2022 and was doing well with heart rate in the mid 40s.  His blood pressure was well-controlled and amlodipine was discontinued due to lower extremity swelling.  He is also being followed by Dr. Laneta Morris for management of aortic aneurysm recommended good blood pressure control with no further CT scans recommended.   During  today's visit the patient reports*** .  Patient denies chest pain, palpitations, dyspnea, PND, orthopnea, nausea, vomiting, dizziness, syncope, edema, weight gain, or early satiety.  ***Notes: -Last ischemic evaluation: -Last echo: -Interim ED visits: Review of Systems  Please see the history of present illness.    All other systems reviewed and are otherwise negative except as noted above.  Physical Exam    Wt Readings from Last 3 Encounters:  08/04/22 156 lb (70.8 kg)  05/31/22 153 lb 12.8 oz (69.8 kg)  03/26/22 146 lb 9.7 oz (66.5 kg)   YQ:MVHQI were no vitals filed for this visit.,There is no height or weight on file to calculate BMI. GEN: Well nourished, well developed in no acute distress Neck: No JVD; No carotid bruits Pulmonary: Clear to auscultation without rales, wheezing or rhonchi  Cardiovascular: Normal rate. Regular rhythm. Normal S1. Normal S2.   Murmurs: There is no murmur.  ABDOMEN: Soft, non-tender, non-distended EXTREMITIES:  No edema; No deformity   EKG/LABS/ Recent Cardiac Studies   ECG personally reviewed by me today - ***  Risk Assessment/Calculations:   {Does this patient have ATRIAL FIBRILLATION?:[phone]}      Lab Results  Component Value Date   WBC 6.8 03/28/2022   HGB 10.4 (L) 03/28/2022   HCT 33.2 (L) 03/28/2022   MCV 90.5 03/28/2022   PLT 185 03/28/2022  Lab Results  Component Value Date   CREATININE 1.09 03/28/2022   BUN 16 03/28/2022   NA 139 03/28/2022   K 3.7 03/28/2022   CL 110 03/28/2022   CO2 22 03/28/2022   No results found for: "CHOL", "HDL", "LDLCALC", "LDLDIRECT", "TRIG", "CHOLHDL"  No results found for: "HGBA1C" Assessment & Plan    1.  Sinus bradycardia: -Patient has first-degree AV block heart rate today of*** -He is currently***  2.  Hyperlipidemia: -Patient's last LDL cholesterol was***  3.  Thoracic ascending aortic aneurysm: -Followed by Dr. Laneta Morris with recommendation to continue BP control and no further  CT scans needed at this time. -Continue good blood pressure control.  4.  History of PE: -Patient is currently on chronic anticoagulation with Xarelto 20 mg daily -Patient's creatinine clearance is***       Disposition: Follow-up with Connor Miss, MD or APP in *** months {Are you ordering a CV Procedure (e.g. stress test, cath, DCCV, TEE, etc)?   Press F2        :[id number]}   Signed, Napoleon Form, Leodis Rains, NP 05/04/2023, 6:17 PM [facility]

## 2023-05-04 NOTE — Telephone Encounter (Signed)
STAT if HR is under 50 or over 120 (normal HR is 60-100 beats per minute)  What is your heart rate? Daughter states he was suppose to have cataract surgery today but his HR was 34.   Do you have a log of your heart rate readings (document readings)? no  Do you have any other symptoms? She states he has dementia.  She states he just seems more tired to her than usual.

## 2023-05-05 ENCOUNTER — Ambulatory Visit: Payer: Medicare HMO | Attending: Nurse Practitioner | Admitting: Nurse Practitioner

## 2023-05-05 ENCOUNTER — Ambulatory Visit: Payer: Medicare HMO | Attending: Nurse Practitioner

## 2023-05-05 ENCOUNTER — Encounter: Payer: Self-pay | Admitting: Nurse Practitioner

## 2023-05-05 VITALS — BP 132/62 | HR 62 | Ht 69.0 in | Wt 163.4 lb

## 2023-05-05 DIAGNOSIS — I7121 Aneurysm of the ascending aorta, without rupture: Secondary | ICD-10-CM | POA: Diagnosis not present

## 2023-05-05 DIAGNOSIS — Z86711 Personal history of pulmonary embolism: Secondary | ICD-10-CM

## 2023-05-05 DIAGNOSIS — R001 Bradycardia, unspecified: Secondary | ICD-10-CM

## 2023-05-05 DIAGNOSIS — E785 Hyperlipidemia, unspecified: Secondary | ICD-10-CM | POA: Diagnosis not present

## 2023-05-05 NOTE — Patient Instructions (Signed)
Medication Instructions:  The current medical regimen is effective;  continue present plan and medications as directed. Please refer to the Current Medication list given to you today.  *If you need a refill on your cardiac medications before your next appointment, please call your pharmacy*  Lab Work: NONE  Testing/Procedures: 14 DAY ZIO MONITOR-SEE BELOW  Follow-Up: At [facility], you and your health needs are our priority.  As part of our continuing mission to provide you with exceptional heart care, we have created designated Provider Care Teams.  These Care Teams include your primary Cardiologist (physician) and Advanced Practice Providers (APPs -  Physician Assistants and Nurse Practitioners) who all work together to provide you with the care you need, when you need it.  Your next appointment:   2 month(s)  Provider:   Kristeen Miss, MD       Christena Deem- Long Term Monitor Instructions  Your physician has requested you wear a ZIO patch monitor for 14 days.  This is a single patch monitor. [employer] supplies one patch monitor per enrollment. Additional stickers are not available. Please do not apply patch if you will be having a Nuclear Stress Test,  Echocardiogram, Cardiac CT, MRI, or Chest Xray during the period you would be wearing the  monitor. The patch cannot be worn during these tests. You cannot remove and re-apply the  ZIO XT patch monitor.  Your ZIO patch monitor will be mailed 3 day USPS to your address on file. It may take 3-5 days  to receive your monitor after you have been enrolled.  Once you have received your monitor, please review the enclosed instructions. Your monitor  has already been registered assigning a specific monitor serial # to you.  Billing and Patient Assistance Program Information  We have supplied [employer] with any of your insurance information on file for billing purposes. [employer] offers a sliding scale Patient Assistance Program for  patients that do not have  insurance, or whose insurance does not completely cover the cost of the ZIO monitor.  You must apply for the Patient Assistance Program to qualify for this discounted rate.  To apply, please call [employer] at [phone], select option 4, select option 2, ask to apply for  Patient Assistance Program. Meredeth Ide will ask your household income, and how many people  are in your household. They will quote your out-of-pocket cost based on that information.  [employer] will also be able to set up a 21-month, interest-free payment plan if needed.  Applying the monitor   Shave hair from upper left chest.  Hold abrader disc by orange tab. Rub abrader in 40 strokes over the upper left chest as  indicated in your monitor instructions.  Clean area with 4 enclosed alcohol pads. Let dry.  Apply patch as indicated in monitor instructions. Patch will be placed under collarbone on left  side of chest with arrow pointing upward.  Rub patch adhesive wings for 2 minutes. Remove white label marked "1". Remove the white  label marked "2". Rub patch adhesive wings for 2 additional minutes.  While looking in a mirror, press and release button in center of patch. A small green light will  flash 3-4 times. This will be your only indicator that the monitor has been turned on.  Do not shower for the first 24 hours. You may shower after the first 24 hours.  Press the button if you feel a symptom. You will hear a small click. Record Date, Time and  Symptom in the Patient Logbook.  When you are ready to remove the patch, follow instructions on the last 2 pages of Patient  Logbook. Stick patch monitor onto the last page of Patient Logbook.  Place Patient Logbook in the blue and white box. Use locking tab on box and tape box closed  securely. The blue and white box has prepaid postage on it. Please place it in the mailbox as  soon as possible. Your physician should have your test results approximately  7 days after the  monitor has been mailed back to [facility].  Call [facility] Customer Care at [phone] if you have questions regarding  your ZIO XT patch monitor. Call them immediately if you see an orange light blinking on your  monitor.  If your monitor falls off in less than 4 days, contact our Monitor department at [phone].  If your monitor becomes loose or falls off after 4 days call [employer] at [phone] for  suggestions on securing your monitor

## 2023-05-05 NOTE — Progress Notes (Unsigned)
Enrolled for [employer] to mail a ZIO XT long term holter monitor to the patients address on file.   Dr. Elease Hashimoto to read.

## 2023-05-11 DIAGNOSIS — R001 Bradycardia, unspecified: Secondary | ICD-10-CM

## 2023-05-16 ENCOUNTER — Telehealth: Payer: Self-pay | Admitting: Cardiovascular Disease

## 2023-05-16 NOTE — Telephone Encounter (Signed)
Daughter Vernona Rieger) stated patient was given a heart monitor on Wednesday and yesterday she discovered patient had taken the heart monitor off.  Daughter stated she is going to return the heart monitor.

## 2023-05-17 NOTE — Telephone Encounter (Signed)
Pt seen by Robin Searing, NP on 05/05/23: -We will have him wear a 7-day ZIO monitor to evaluate for further bradycardia or arrhythmias.  Will route just to make aware/FYI.

## 2023-05-24 DIAGNOSIS — R001 Bradycardia, unspecified: Secondary | ICD-10-CM | POA: Diagnosis not present

## 2023-06-28 DIAGNOSIS — E785 Hyperlipidemia, unspecified: Secondary | ICD-10-CM | POA: Diagnosis not present

## 2023-06-28 DIAGNOSIS — I10 Essential (primary) hypertension: Secondary | ICD-10-CM | POA: Diagnosis not present

## 2023-06-28 DIAGNOSIS — Z79899 Other long term (current) drug therapy: Secondary | ICD-10-CM | POA: Diagnosis not present

## 2023-06-28 DIAGNOSIS — C61 Malignant neoplasm of prostate: Secondary | ICD-10-CM | POA: Diagnosis not present

## 2023-07-05 DIAGNOSIS — Z1339 Encounter for screening examination for other mental health and behavioral disorders: Secondary | ICD-10-CM | POA: Diagnosis not present

## 2023-07-05 DIAGNOSIS — Z23 Encounter for immunization: Secondary | ICD-10-CM | POA: Diagnosis not present

## 2023-07-05 DIAGNOSIS — E46 Unspecified protein-calorie malnutrition: Secondary | ICD-10-CM | POA: Diagnosis not present

## 2023-07-05 DIAGNOSIS — D6869 Other thrombophilia: Secondary | ICD-10-CM | POA: Diagnosis not present

## 2023-07-05 DIAGNOSIS — C679 Malignant neoplasm of bladder, unspecified: Secondary | ICD-10-CM | POA: Diagnosis not present

## 2023-07-05 DIAGNOSIS — Z0000 Encounter for general adult medical examination without abnormal findings: Secondary | ICD-10-CM | POA: Diagnosis not present

## 2023-07-05 DIAGNOSIS — I10 Essential (primary) hypertension: Secondary | ICD-10-CM | POA: Diagnosis not present

## 2023-07-05 DIAGNOSIS — B351 Tinea unguium: Secondary | ICD-10-CM | POA: Diagnosis not present

## 2023-07-05 DIAGNOSIS — D692 Other nonthrombocytopenic purpura: Secondary | ICD-10-CM | POA: Diagnosis not present

## 2023-07-05 DIAGNOSIS — Z86718 Personal history of other venous thrombosis and embolism: Secondary | ICD-10-CM | POA: Diagnosis not present

## 2023-07-05 DIAGNOSIS — C61 Malignant neoplasm of prostate: Secondary | ICD-10-CM | POA: Diagnosis not present

## 2023-07-05 DIAGNOSIS — I700 Atherosclerosis of aorta: Secondary | ICD-10-CM | POA: Diagnosis not present

## 2023-07-05 DIAGNOSIS — Z1331 Encounter for screening for depression: Secondary | ICD-10-CM | POA: Diagnosis not present

## 2023-07-06 NOTE — Progress Notes (Unsigned)
Cardiology Office Note    Patient Name: Connor Morris Date of Encounter: 07/06/2023  Primary Care Provider:  Alysia Penna, MD Primary Cardiologist:  Kristeen Miss, MD Primary Electrophysiologist: None   Past Medical History    Past Medical History:  Diagnosis Date   1st degree AV block    AAA (abdominal aortic aneurysm) (HCC) 03/2018   followed by dr Laneta Simmers--- last CTA  4.6cm   Anticoagulated 03/2018   xarelto for hx pe/ dvt--- managed by pcp   Arthritis    Cancer (HCC)    Emphysema lung (HCC)    Hiatal hernia    History of bladder cancer    urologist--- dr Benancio Deeds   s/p TURBT feb and march 2021   History of DVT of lower extremity 03/2018   LLE   History of prostate cancer 2002   s/p radioactive seed implants 04-11-2001   History of pulmonary embolus (PE) 03/2018   left side--  on xarelto   History of sepsis 01/30/2020   hospital admission in epic;  due to UTI   Impaired memory    Retained ureteral stent    right side   Sinus bradycardia 2018   cardiologist--- dr Melburn Popper   Urgency of urination     History of Present Illness  Connor Morris is a 89 y.o. male with a PMH of thoracic aneurysm, HLD, bradycardia, first-degree AVB, HLD, prostate CA s/p radiation seeds, left lower extremity DVT/PE 2019 (on Xarelto), who presents today for 14-month follow-up.  Connor Morris was seen last on 05/05/2023 for complaint of bradycardia.  He was having cataract surgery and heart rate during procedure was 34 and procedure was canceled.  During his visit patient had heart rate of 80 and EKG obtained showing sinus rhythm.  Patient was provided a ZIO monitor that showed predominantly sinus rhythm with 1 episode of NSVT and no pauses or malignant arrhythmias.  There was 1 episode of benign slow heart rate that was not sustained.  The patient, with a history of bradycardia, presented for follow-up after an episode of bradycardia during a cataract surgery. The surgery was cancelled and the  patient was evaluated with an EKG and a heart monitor, both of which showed normal results. Since then, the patient has been feeling well with no symptoms of dizziness or fatigue. The patient has not yet undergone the cataract surgery.  Patient denies chest pain, palpitations, dyspnea, PND, orthopnea, nausea, vomiting, dizziness, syncope, edema, weight gain, or early satiety.  Review of Systems  Please see the history of present illness.    All other systems reviewed and are otherwise negative except as noted above.  Physical Exam    Wt Readings from Last 3 Encounters:  05/05/23 163 lb 6.4 oz (74.1 kg)  08/04/22 156 lb (70.8 kg)  05/31/22 153 lb 12.8 oz (69.8 kg)   XL:KGMWN were no vitals filed for this visit.,There is no height or weight on file to calculate BMI. GEN: Well nourished, well developed in no acute distress Neck: No JVD; No carotid bruits Pulmonary: Clear to auscultation without rales, wheezing or rhonchi  Cardiovascular: Normal rate. Regular rhythm. Normal S1. Normal S2.   Murmurs: There is no murmur.  ABDOMEN: Soft, non-tender, non-distended EXTREMITIES:  No edema; No deformity   EKG/LABS/ Recent Cardiac Studies   ECG personally reviewed by me today -none completed today  Risk Assessment/Calculations:          Lab Results  Component Value Date   WBC 6.8 03/28/2022   HGB  10.4 (L) 03/28/2022   HCT 33.2 (L) 03/28/2022   MCV 90.5 03/28/2022   PLT 185 03/28/2022   Lab Results  Component Value Date   CREATININE 1.09 03/28/2022   BUN 16 03/28/2022   NA 139 03/28/2022   K 3.7 03/28/2022   CL 110 03/28/2022   CO2 22 03/28/2022   No results found for: "CHOL", "HDL", "LDLCALC", "LDLDIRECT", "TRIG", "CHOLHDL"  No results found for: "HGBA1C" Assessment & Plan    1.  Sinus bradycardia: -Patient wore a 14-day ZIO that showed predominantly sinus rhythm with no evidence of arrhythmia or pauses No symptoms of dizziness or fatigue.  -Patient has a known history of  bradycardia and a type of heart block, but no symptoms suggestive of needing a pacemaker. -Consider longer duration of heart monitoring for further evaluation. -Continue current medications, including Xarelto.   2.  Hyperlipidemia: -Patient's last LDL cholesterol was 147 -Currently followed by PCP   3.  Thoracic ascending aortic aneurysm: -Followed by Dr. Laneta Simmers with recommendation to continue BP control and no further CT scans needed at this time. -Continue good blood pressure control.   4.  History of PE: -Patient is currently on chronic anticoagulation with Xarelto 20 mg daily -Patient's creatinine clearance is 54 mL/min  5.  Elevated blood pressure: Blood pressure elevated in office, but patient reports normal readings at home. -Provide blood pressure log for home monitoring over the next 1-2 weeks.  Disposition: Follow-up with Kristeen Miss, MD or APP in 12 months  Signed, Napoleon Form, Leodis Rains, NP 07/06/2023, 10:15 AM [facility]

## 2023-07-07 ENCOUNTER — Encounter: Payer: Self-pay | Admitting: Nurse Practitioner

## 2023-07-07 ENCOUNTER — Ambulatory Visit: Payer: Medicare HMO | Attending: Nurse Practitioner | Admitting: Nurse Practitioner

## 2023-07-07 VITALS — BP 158/62 | HR 55 | Ht 69.0 in | Wt 160.6 lb

## 2023-07-07 DIAGNOSIS — R001 Bradycardia, unspecified: Secondary | ICD-10-CM

## 2023-07-07 DIAGNOSIS — I7121 Aneurysm of the ascending aorta, without rupture: Secondary | ICD-10-CM

## 2023-07-07 DIAGNOSIS — R030 Elevated blood-pressure reading, without diagnosis of hypertension: Secondary | ICD-10-CM | POA: Diagnosis not present

## 2023-07-07 DIAGNOSIS — E785 Hyperlipidemia, unspecified: Secondary | ICD-10-CM | POA: Diagnosis not present

## 2023-07-07 DIAGNOSIS — Z86711 Personal history of pulmonary embolism: Secondary | ICD-10-CM

## 2023-07-07 NOTE — Patient Instructions (Signed)
Medication Instructions:  Your physician recommends that you continue on your current medications as directed. Please refer to the Current Medication list given to you today. *If you need a refill on your cardiac medications before your next appointment, please call your pharmacy*   Lab Work: None ordered   Testing/Procedures: None ordered   Follow-Up: At [facility], you and your health needs are our priority.  As part of our continuing mission to provide you with exceptional heart care, we have created designated Provider Care Teams.  These Care Teams include your primary Cardiologist (physician) and Advanced Practice Providers (APPs -  Physician Assistants and Nurse Practitioners) who all work together to provide you with the care you need, when you need it.  We recommend signing up for the patient portal called "MyChart".  Sign up information is provided on this After Visit Summary.  MyChart is used to connect with patients for Virtual Visits (Telemedicine).  Patients are able to view lab/test results, encounter notes, upcoming appointments, etc.  Non-urgent messages can be sent to your provider as well.   To learn more about what you can do with MyChart, go to [email or web address].    Your next appointment:   12 month(s)  Provider:   Kristeen Miss, MD     Other Instructions Check your blood pressure daily for 1 week, then contact the office with your readings.  Make sure to check 2 hours after your medications.   AVOID these things for 30 minutes before checking your blood pressure: No Drinking caffeine. No Drinking alcohol. No Eating. No Smoking. No Exercising.  Five minutes before checking your blood pressure: Pee. Sit in a dining chair. Avoid sitting in a soft couch or armchair. Be quiet. Do not talk.

## 2023-07-25 ENCOUNTER — Encounter: Payer: Self-pay | Admitting: Cardiovascular Disease

## 2023-07-26 ENCOUNTER — Ambulatory Visit: Payer: Medicare HMO | Admitting: Podiatry

## 2023-07-26 ENCOUNTER — Encounter: Payer: Self-pay | Admitting: Podiatry

## 2023-07-26 DIAGNOSIS — M79675 Pain in left toe(s): Secondary | ICD-10-CM | POA: Diagnosis not present

## 2023-07-26 DIAGNOSIS — B351 Tinea unguium: Secondary | ICD-10-CM

## 2023-07-26 DIAGNOSIS — M79674 Pain in right toe(s): Secondary | ICD-10-CM | POA: Diagnosis not present

## 2023-07-26 NOTE — Progress Notes (Unsigned)
Subjective:  Patient ID: Connor Morris, male    DOB: 1933-10-26,  MRN: [id number]   Connor Morris presents to clinic today for:  Chief Complaint  Patient presents with   Nail Problem    Patient would like a nail trim. He reports long nails and the spouse has trouble trimming the nails, he is not diabetic.  The patient is not sure if he has a nail fungus.   . Patient notes nails are thick, discolored, elongated and painful in shoegear when trying to ambulate.    PCP is Alysia Penna, MD.  Past Medical History:  Diagnosis Date   1st degree AV block    AAA (abdominal aortic aneurysm) (HCC) 03/2018   followed by dr Laneta Simmers--- last CTA  4.6cm   Anticoagulated 03/2018   xarelto for hx pe/ dvt--- managed by pcp   Arthritis    Cancer (HCC)    Emphysema lung (HCC)    Hiatal hernia    History of bladder cancer    urologist--- dr Benancio Deeds   s/p TURBT feb and march 2021   History of DVT of lower extremity 03/2018   LLE   History of prostate cancer 2002   s/p radioactive seed implants 04-11-2001   History of pulmonary embolus (PE) 03/2018   left side--  on xarelto   History of sepsis 01/30/2020   hospital admission in epic;  due to UTI   Impaired memory    Retained ureteral stent    right side   Sinus bradycardia 2018   cardiologist--- dr Melburn Popper   Urgency of urination     Past Surgical History:  Procedure Laterality Date   CATARACT EXTRACTION W/ INTRAOCULAR LENS IMPLANT Right 2020   CYSTOSCOPY N/A 10/30/2019   Procedure: CYSTOSCOPY;  Surgeon: Malen Gauze, MD;  Location: [facility];  Service: Urology;  Laterality: N/A;   CYSTOSCOPY W/ RETROGRADES Bilateral 09/20/2019   Procedure: CYSTOSCOPY WITH RETROGRADE PYELOGRAM;  Surgeon: Malen Gauze, MD;  Location: [facility];  Service: Urology;  Laterality: Bilateral;   CYSTOSCOPY W/ URETERAL STENT REMOVAL N/A 03/25/2020   Procedure: CYSTOSCOPY WITH  REMOVAL OF RETAINED FOLEY CATHETER,   FULGERATION;  Surgeon: Belva Agee, MD;  Location[facility];  Service: Urology;  Laterality: N/A;   FOOT SURGERY  yrs ago   RADIOACTIVE PROSTATE SEED IMPLANTS  04-11-2001  dr Brunilda Payor  @[facility]    TRANSURETHRAL RESECTION OF BLADDER TUMOR N/A 09/20/2019   Procedure: TRANSURETHRAL RESECTION OF BLADDER TUMOR (TURBT);  Surgeon: Malen Gauze, MD;  Location: [facility];  Service: Urology;  Laterality: N/A;  1 HR   TRANSURETHRAL RESECTION OF BLADDER TUMOR N/A 10/30/2019   Procedure: TRANSURETHRAL RESECTION OF BLADDER TUMOR (TURBT), REMOVAL OF RIGHT STENT;  Surgeon: Malen Gauze, MD;  Location: [facility];  Service: Urology;  Laterality: N/A;    Allergies  Allergen Reactions   Aspirin     Other Reaction(s): Unknown    Review of Systems: Negative except as noted in the HPI.  Objective:  Quention Scura is a pleasant 89 y.o. male in NAD. AAO x 3.  Vascular Examination: Capillary refill time is 3-5 seconds to toes bilateral. Palpable pedal pulses b/l LE. Digital hair present b/l.  Skin temperature gradient WNL b/l. No varicosities b/l. No cyanosis noted b/l.   Dermatological Examination: Pedal skin with normal turgor, texture and tone b/l. No open wounds. No interdigital macerations b/l. Toenails x10 are 3mm  thick, discolored, dystrophic with subungual debris. There is pain with compression of the nail plates.  They are elongated x10  Assessment/Plan: 1. Pain due to onychomycosis of toenails of both feet    The mycotic toenails were sharply debrided x10 with sterile nail nippers and a power debriding burr to decrease bulk/thickness and length.    Return in about 3 months (around 10/24/2023) for RFC.   Clerance Lav, DPM, FACFAS [facility]     [address] [place], [place] [address]                Office [phone]  Fax [phone]

## 2023-10-18 DIAGNOSIS — C61 Malignant neoplasm of prostate: Secondary | ICD-10-CM | POA: Diagnosis not present

## 2023-10-25 DIAGNOSIS — N35013 Post-traumatic anterior urethral stricture: Secondary | ICD-10-CM | POA: Diagnosis not present

## 2023-10-25 DIAGNOSIS — C672 Malignant neoplasm of lateral wall of bladder: Secondary | ICD-10-CM | POA: Diagnosis not present

## 2023-10-25 DIAGNOSIS — C61 Malignant neoplasm of prostate: Secondary | ICD-10-CM | POA: Diagnosis not present

## 2023-11-01 ENCOUNTER — Ambulatory Visit: Payer: Medicare HMO | Admitting: Podiatry

## 2023-11-01 ENCOUNTER — Encounter: Payer: Self-pay | Admitting: Podiatry

## 2023-11-01 DIAGNOSIS — M79675 Pain in left toe(s): Secondary | ICD-10-CM | POA: Diagnosis not present

## 2023-11-01 DIAGNOSIS — M79674 Pain in right toe(s): Secondary | ICD-10-CM

## 2023-11-01 DIAGNOSIS — B351 Tinea unguium: Secondary | ICD-10-CM

## 2023-11-01 NOTE — Progress Notes (Signed)
 Subjective:  Patient ID: Connor Morris, male    DOB: 1934-01-30,  MRN: [id number]   Connor Morris presents to clinic today for:  Chief Complaint  Patient presents with   Diabetes    Patient is here for [facility]   Patient notes nails are thick, discolored, elongated and painful in shoegear when trying to ambulate.  A family member is with him today.  She drives from [place] to pick him up and bring him to his appointments.  PCP is Alysia Penna, MD.  Past Medical History:  Diagnosis Date   1st degree AV block    AAA (abdominal aortic aneurysm) (HCC) 03/2018   followed by dr Laneta Simmers--- last CTA  4.6cm   Anticoagulated 03/2018   xarelto for hx pe/ dvt--- managed by pcp   Arthritis    Cancer (HCC)    Emphysema lung (HCC)    Hiatal hernia    History of bladder cancer    urologist--- dr Benancio Deeds   s/p TURBT feb and march 2021   History of DVT of lower extremity 03/2018   LLE   History of prostate cancer 2002   s/p radioactive seed implants 04-11-2001   History of pulmonary embolus (PE) 03/2018   left side--  on xarelto   History of sepsis 01/30/2020   hospital admission in epic;  due to UTI   Impaired memory    Retained ureteral stent    right side   Sinus bradycardia 2018   cardiologist--- dr Melburn Popper   Urgency of urination     Past Surgical History:  Procedure Laterality Date   CATARACT EXTRACTION W/ INTRAOCULAR LENS IMPLANT Right 2020   CYSTOSCOPY N/A 10/30/2019   Procedure: CYSTOSCOPY;  Surgeon: Malen Gauze, MD;  Location: [facility];  Service: Urology;  Laterality: N/A;   CYSTOSCOPY W/ RETROGRADES Bilateral 09/20/2019   Procedure: CYSTOSCOPY WITH RETROGRADE PYELOGRAM;  Surgeon: Malen Gauze, MD;  Location: [facility];  Service: Urology;  Laterality: Bilateral;   CYSTOSCOPY W/ URETERAL STENT REMOVAL N/A 03/25/2020   Procedure: CYSTOSCOPY WITH  REMOVAL OF RETAINED FOLEY CATHETER,  FULGERATION;  Surgeon: Belva Agee, MD;  Location: [facility];  Service: Urology;  Laterality: N/A;   FOOT SURGERY  yrs ago   RADIOACTIVE PROSTATE SEED IMPLANTS  04-11-2001  dr Brunilda Payor  @[email or web address]    TRANSURETHRAL RESECTION OF BLADDER TUMOR N/A 09/20/2019   Procedure: TRANSURETHRAL RESECTION OF BLADDER TUMOR (TURBT);  Surgeon: Malen Gauze, MD;  Location: [facility];  Service: Urology;  Laterality: N/A;  1 HR   TRANSURETHRAL RESECTION OF BLADDER TUMOR N/A 10/30/2019   Procedure: TRANSURETHRAL RESECTION OF BLADDER TUMOR (TURBT), REMOVAL OF RIGHT STENT;  Surgeon: Malen Gauze, MD;  Location: [facility];  Service: Urology;  Laterality: N/A;    Allergies  Allergen Reactions   Aspirin     Other Reaction(s): Unknown    Review of Systems: Negative except as noted in the HPI.  Objective:  Vascular Examination: Capillary refill time is 3-5 seconds to toes bilateral. Trace palpable pedal pulses b/l LE. Digital hair sparse b/l.  Skin temperature gradient WNL b/l. No varicosities b/l. No cyanosis noted b/l.   Dermatological Examination: Pedal skin with normal turgor, texture and tone b/l. No open wounds. No interdigital macerations b/l. Toenails x10 are 3mm thick, discolored, dystrophic with subungual debris. There is pain with compression of the nail plates.  They are elongated x10  Assessment/Plan: 1.  Pain due to onychomycosis of toenails of both feet     The mycotic toenails were sharply debrided x10 with sterile nail nippers and a power debriding burr to decrease bulk/thickness and length.    Return in about 3 months (around 02/01/2024) for RFC.   Clerance Lav, DPM, FACFAS [facility]     [address] [place], [place] [address]                Office [phone]  Fax [phone]

## 2023-12-27 DIAGNOSIS — E46 Unspecified protein-calorie malnutrition: Secondary | ICD-10-CM | POA: Diagnosis not present

## 2023-12-27 DIAGNOSIS — I719 Aortic aneurysm of unspecified site, without rupture: Secondary | ICD-10-CM | POA: Diagnosis not present

## 2023-12-27 DIAGNOSIS — D6869 Other thrombophilia: Secondary | ICD-10-CM | POA: Diagnosis not present

## 2023-12-27 DIAGNOSIS — I10 Essential (primary) hypertension: Secondary | ICD-10-CM | POA: Diagnosis not present

## 2023-12-27 DIAGNOSIS — J439 Emphysema, unspecified: Secondary | ICD-10-CM | POA: Diagnosis not present

## 2023-12-27 DIAGNOSIS — C679 Malignant neoplasm of bladder, unspecified: Secondary | ICD-10-CM | POA: Diagnosis not present

## 2023-12-27 DIAGNOSIS — C61 Malignant neoplasm of prostate: Secondary | ICD-10-CM | POA: Diagnosis not present

## 2023-12-27 DIAGNOSIS — I700 Atherosclerosis of aorta: Secondary | ICD-10-CM | POA: Diagnosis not present

## 2024-02-13 ENCOUNTER — Ambulatory Visit: Admitting: Podiatry

## 2024-02-13 DIAGNOSIS — M79675 Pain in left toe(s): Secondary | ICD-10-CM | POA: Diagnosis not present

## 2024-02-13 DIAGNOSIS — B351 Tinea unguium: Secondary | ICD-10-CM

## 2024-02-13 DIAGNOSIS — M79674 Pain in right toe(s): Secondary | ICD-10-CM | POA: Diagnosis not present

## 2024-02-13 DIAGNOSIS — I739 Peripheral vascular disease, unspecified: Secondary | ICD-10-CM | POA: Diagnosis not present

## 2024-02-13 DIAGNOSIS — L84 Corns and callosities: Secondary | ICD-10-CM | POA: Diagnosis not present

## 2024-02-13 NOTE — Progress Notes (Signed)
       Subjective:  Patient ID: Connor Morris, male    DOB: 07/30/1934,  MRN: [id number]  Connor Morris presents to clinic today for:  Chief Complaint  Patient presents with   Nail Problem    Pt stated that he is here for a nail trim    Patient notes nails are thick and elongated, causing pain in shoe gear when ambulating.  He has painful corns on the left fifth toe and right submet 5  PCP is Larnell Hamilton, MD. last seen around 12/27/2023  Past Medical History:  Diagnosis Date   1st degree AV block    AAA (abdominal aortic aneurysm) (HCC) 03/2018   followed by dr lucas--- last CTA  4.6cm   Anticoagulated 03/2018   xarelto  for hx pe/ dvt--- managed by pcp   Arthritis    Cancer (HCC)    Emphysema lung (HCC)    Hiatal hernia    History of bladder cancer    urologist--- dr rosalind   s/p TURBT feb and march 2021   History of DVT of lower extremity 03/2018   LLE   History of prostate cancer 2002   s/p radioactive seed implants 04-11-2001   History of pulmonary embolus (PE) 03/2018   left side--  on xarelto    History of sepsis 01/30/2020   hospital admission in epic;  due to UTI   Impaired memory    Retained ureteral stent    right side   Sinus bradycardia 2018   cardiologist--- dr calhoun   Urgency of urination    Allergies  Allergen Reactions   Aspirin      Other Reaction(s): Unknown    Objective:  Connor Morris is a pleasant 89 y.o. male in NAD. AAO x 3.  Vascular Examination: Patient has palpable 2/4 DP pulse, absent PT pulse bilateral.  Delayed capillary refill bilateral toes.  Sparse digital hair bilateral.  Proximal to distal cooling WNL bilateral.    Dermatological Examination: Interspaces are clear with no open lesions noted bilateral.  Skin is shiny and atrophic bilateral.  Nails are 3-61mm thick, with yellowish/brown discoloration, subungual debris and distal onycholysis x10.  There is pain with compression of nails x10.  There are hyperkeratotic lesions  noted right submet 5 and dorsal lateral aspect of left fifth toe PIPJ.  Skin is dry  Patient qualifies for at-risk foot care because of mild PVD.  Assessment/Plan: 1. Pain due to onychomycosis of toenails of both feet   2. Corns   3. PVD (peripheral vascular disease) (HCC)    Mycotic nails x10 were sharply debrided with sterile nail nippers and power debriding burr to decrease bulk and length.  Hyperkeratotic lesions x 2 were shaved with #312 blade.  Please see dermatological exam note for specific location of lesions.  Return in about 3 months (around 05/15/2024) for RFC.   Awanda CHARM Imperial, DPM, FACFAS [facility]     [address] [place], [place] [address]                Office [phone]  Fax [phone]

## 2024-03-19 ENCOUNTER — Encounter (HOSPITAL_COMMUNITY): Payer: Self-pay

## 2024-03-19 ENCOUNTER — Emergency Department (HOSPITAL_COMMUNITY)

## 2024-03-19 ENCOUNTER — Inpatient Hospital Stay (HOSPITAL_COMMUNITY)
Admission: EM | Admit: 2024-03-19 | Discharge: 2024-03-24 | DRG: 177 | Disposition: A | Discharge status: Home Health | Attending: Student | Admitting: Student

## 2024-03-19 ENCOUNTER — Other Ambulatory Visit: Payer: Self-pay

## 2024-03-19 DIAGNOSIS — Z87891 Personal history of nicotine dependence: Secondary | ICD-10-CM

## 2024-03-19 DIAGNOSIS — Z86711 Personal history of pulmonary embolism: Secondary | ICD-10-CM

## 2024-03-19 DIAGNOSIS — J439 Emphysema, unspecified: Secondary | ICD-10-CM | POA: Diagnosis present

## 2024-03-19 DIAGNOSIS — Z86718 Personal history of other venous thrombosis and embolism: Secondary | ICD-10-CM

## 2024-03-19 DIAGNOSIS — R001 Bradycardia, unspecified: Secondary | ICD-10-CM | POA: Diagnosis present

## 2024-03-19 DIAGNOSIS — R531 Weakness: Secondary | ICD-10-CM | POA: Diagnosis not present

## 2024-03-19 DIAGNOSIS — E785 Hyperlipidemia, unspecified: Secondary | ICD-10-CM | POA: Diagnosis present

## 2024-03-19 DIAGNOSIS — J690 Pneumonitis due to inhalation of food and vomit: Secondary | ICD-10-CM | POA: Diagnosis present

## 2024-03-19 DIAGNOSIS — D649 Anemia, unspecified: Secondary | ICD-10-CM | POA: Diagnosis present

## 2024-03-19 DIAGNOSIS — I440 Atrioventricular block, first degree: Secondary | ICD-10-CM | POA: Diagnosis present

## 2024-03-19 DIAGNOSIS — Z79899 Other long term (current) drug therapy: Secondary | ICD-10-CM

## 2024-03-19 DIAGNOSIS — Z8546 Personal history of malignant neoplasm of prostate: Secondary | ICD-10-CM

## 2024-03-19 DIAGNOSIS — K4020 Bilateral inguinal hernia, without obstruction or gangrene, not specified as recurrent: Secondary | ICD-10-CM | POA: Diagnosis not present

## 2024-03-19 DIAGNOSIS — Z833 Family history of diabetes mellitus: Secondary | ICD-10-CM | POA: Diagnosis not present

## 2024-03-19 DIAGNOSIS — F0390 Unspecified dementia without behavioral disturbance: Secondary | ICD-10-CM | POA: Diagnosis present

## 2024-03-19 DIAGNOSIS — Z8551 Personal history of malignant neoplasm of bladder: Secondary | ICD-10-CM

## 2024-03-19 DIAGNOSIS — Z8249 Family history of ischemic heart disease and other diseases of the circulatory system: Secondary | ICD-10-CM

## 2024-03-19 DIAGNOSIS — I7121 Aneurysm of the ascending aorta, without rupture: Secondary | ICD-10-CM | POA: Diagnosis present

## 2024-03-19 DIAGNOSIS — N179 Acute kidney failure, unspecified: Secondary | ICD-10-CM | POA: Diagnosis present

## 2024-03-19 DIAGNOSIS — R569 Unspecified convulsions: Secondary | ICD-10-CM | POA: Diagnosis not present

## 2024-03-19 DIAGNOSIS — R9082 White matter disease, unspecified: Secondary | ICD-10-CM | POA: Diagnosis not present

## 2024-03-19 DIAGNOSIS — U071 COVID-19: Principal | ICD-10-CM | POA: Diagnosis present

## 2024-03-19 DIAGNOSIS — J168 Pneumonia due to other specified infectious organisms: Secondary | ICD-10-CM | POA: Diagnosis not present

## 2024-03-19 DIAGNOSIS — E860 Dehydration: Secondary | ICD-10-CM | POA: Diagnosis present

## 2024-03-19 DIAGNOSIS — I10 Essential (primary) hypertension: Secondary | ICD-10-CM | POA: Diagnosis present

## 2024-03-19 DIAGNOSIS — N1330 Unspecified hydronephrosis: Secondary | ICD-10-CM | POA: Diagnosis present

## 2024-03-19 DIAGNOSIS — R55 Syncope and collapse: Secondary | ICD-10-CM | POA: Diagnosis present

## 2024-03-19 DIAGNOSIS — J1282 Pneumonia due to coronavirus disease 2019: Secondary | ICD-10-CM | POA: Diagnosis present

## 2024-03-19 DIAGNOSIS — R918 Other nonspecific abnormal finding of lung field: Secondary | ICD-10-CM | POA: Diagnosis not present

## 2024-03-19 DIAGNOSIS — Z66 Do not resuscitate: Secondary | ICD-10-CM | POA: Diagnosis present

## 2024-03-19 DIAGNOSIS — E871 Hypo-osmolality and hyponatremia: Secondary | ICD-10-CM

## 2024-03-19 DIAGNOSIS — Z7901 Long term (current) use of anticoagulants: Secondary | ICD-10-CM

## 2024-03-19 DIAGNOSIS — N281 Cyst of kidney, acquired: Secondary | ICD-10-CM | POA: Diagnosis not present

## 2024-03-19 DIAGNOSIS — K4090 Unilateral inguinal hernia, without obstruction or gangrene, not specified as recurrent: Secondary | ICD-10-CM | POA: Diagnosis present

## 2024-03-19 DIAGNOSIS — J189 Pneumonia, unspecified organism: Principal | ICD-10-CM

## 2024-03-19 LAB — CBC WITH DIFFERENTIAL/PLATELET
Abs Immature Granulocytes: 0.04 K/uL (ref 0.00–0.07)
Basophils Absolute: 0 K/uL (ref 0.0–0.1)
Basophils Relative: 0 %
Eosinophils Absolute: 0 K/uL (ref 0.0–0.5)
Eosinophils Relative: 0 %
HCT: 40.3 % (ref 39.0–52.0)
Hemoglobin: 12.6 g/dL — ABNORMAL LOW (ref 13.0–17.0)
Immature Granulocytes: 1 %
Lymphocytes Relative: 17 %
Lymphs Abs: 1.1 K/uL (ref 0.7–4.0)
MCH: 27.9 pg (ref 26.0–34.0)
MCHC: 31.3 g/dL (ref 30.0–36.0)
MCV: 89.4 fL (ref 80.0–100.0)
Monocytes Absolute: 0.9 K/uL (ref 0.1–1.0)
Monocytes Relative: 14 %
Neutro Abs: 4.3 K/uL (ref 1.7–7.7)
Neutrophils Relative %: 68 %
Platelets: 153 K/uL (ref 150–400)
RBC: 4.51 MIL/uL (ref 4.22–5.81)
RDW: 13.1 % (ref 11.5–15.5)
WBC: 6.3 K/uL (ref 4.0–10.5)
nRBC: 0 % (ref 0.0–0.2)

## 2024-03-19 LAB — COMPREHENSIVE METABOLIC PANEL WITH GFR
ALT: 16 U/L (ref 0–44)
AST: 35 U/L (ref 15–41)
Albumin: 3.5 g/dL (ref 3.5–5.0)
Alkaline Phosphatase: 35 U/L — ABNORMAL LOW (ref 38–126)
Anion gap: 11 (ref 5–15)
BUN: 31 mg/dL — ABNORMAL HIGH (ref 8–23)
CO2: 22 mmol/L (ref 22–32)
Calcium: 9.4 mg/dL (ref 8.9–10.3)
Chloride: 100 mmol/L (ref 98–111)
Creatinine, Ser: 1.55 mg/dL — ABNORMAL HIGH (ref 0.61–1.24)
GFR, Estimated: 43 mL/min — ABNORMAL LOW (ref >60)
Glucose, Bld: 113 mg/dL — ABNORMAL HIGH (ref 70–99)
Potassium: 4.4 mmol/L (ref 3.5–5.1)
Sodium: 133 mmol/L — ABNORMAL LOW (ref 135–145)
Total Bilirubin: 1.2 mg/dL (ref 0.0–1.2)
Total Protein: 7.6 g/dL (ref 6.5–8.1)

## 2024-03-19 LAB — MAGNESIUM: Magnesium: 1.8 mg/dL (ref 1.7–2.4)

## 2024-03-19 LAB — TROPONIN I (HIGH SENSITIVITY)
Troponin I (High Sensitivity): 16 ng/L (ref <18)
Troponin I (High Sensitivity): 18 ng/L — ABNORMAL HIGH (ref <18)

## 2024-03-19 MED ORDER — SODIUM CHLORIDE 0.9 % IV SOLN: 500.0000 mg | Freq: Once | INTRAVENOUS | Status: DC

## 2024-03-19 MED ORDER — SODIUM CHLORIDE 0.9 % IV BOLUS: 1000.0000 mL | Freq: Once | INTRAVENOUS | Status: DC

## 2024-03-19 MED ORDER — SODIUM CHLORIDE 0.9 % IV SOLN: 1.0000 g | Freq: Once | INTRAVENOUS | Status: DC

## 2024-03-19 MED ORDER — SODIUM CHLORIDE 0.9 % IV SOLN
3.0000 g | Freq: Once | INTRAVENOUS | Status: AC
Start: 2024-03-19 — End: 2024-03-19
  Administered 2024-03-19: 3 g via INTRAVENOUS
  Filled 2024-03-19: qty 8

## 2024-03-19 MED ORDER — SODIUM CHLORIDE 0.9 % IV BOLUS
500.0000 mL | Freq: Once | INTRAVENOUS | Status: AC
  Administered 2024-03-19: 500 mL via INTRAVENOUS

## 2024-03-19 NOTE — ED Triage Notes (Signed)
 PT BIB EMS, initially called out for weakness, then had a syncopal episode, but per family is back to baseline, history of dementia.  Family advised EMS that he has not been eating and drinking.  BP 152/90 HR 76 O2 96% RA CBG 121

## 2024-03-19 NOTE — ED Provider Notes (Signed)
 [place] Provider Note   CSN: [id number] Arrival date & time: 03/19/24  1753     Patient presents with: Near Syncope  HPI Johathan Province is a 89 y.o. male with dementia, thoracic aortic aneurysm, history of PE, first-degree heart block, bladder cancer presenting for syncope.  Per his family states that he has been generally weak and not been eating or drinking for a few days.  Did have a syncopal episode earlier today.  Patient is confused at baseline in the setting of his dementia.  At this time is denying chest pain, abdominal pain back pain, headache and visual disturbance.    Near Syncope       Prior to Admission medications   Medication Sig Start Date End Date Taking? Authorizing Provider  acetaminophen  (TYLENOL ) 325 MG tablet Take 2 tablets (650 mg total) by mouth every 6 (six) hours as needed for mild pain (or Fever >/= 101). 03/10/22   Sheikh, [place] Latif, DO  feeding supplement (ENSURE ENLIVE / ENSURE PLUS) LIQD Take 237 mLs by mouth 2 (two) times daily between meals. 03/10/22   Sheikh, Omair Latif, DO  gatifloxacin (ZYMAXID) 0.5 % SOLN Place 1 drop into both eyes. 03/28/23   [provider]  latanoprost  (XALATAN ) 0.005 % ophthalmic solution Place 1 drop into the right eye at bedtime. 09/28/21   [provider]  Multiple Vitamins-Minerals (MULTIVITAMIN WITH MINERALS) tablet Take 1 tablet by mouth daily.     [provider]  prednisoLONE acetate (PRED FORTE) 1 % ophthalmic suspension Place 1 drop into the left eye 4 (four) times daily. 03/28/23   [provider]  rivaroxaban  (XARELTO ) 20 MG TABS tablet Take 20 mg by mouth in the morning.    [provider]    Allergies: Aspirin     Review of Systems  Cardiovascular:  Positive for near-syncope.    Physical Exam   Vitals:   03/19/24 1754  BP: (!) 144/60  Pulse: 74  Resp: 14  Temp: 98.2 F (36.8 C)  SpO2: 98%    CONSTITUTIONAL:   well-appearing, NAD NEURO: Confused, moving all extremities, opening his eyes spontaneously, able to follow simple commands.  Grip strength in both hands is symmetric and equal, sensation intact to light touch distally. EYES:  eyes equal and reactive ENT/NECK:  Supple, no stridor  CARDIO:  regular rate and rhythm, appears well-perfused  PULM:  No respiratory distress, CTAB GI/GU:  non-distended, soft, non tender MSK/SPINE:  No gross deformities, no edema, moves all extremities  SKIN:  no rash, atraumatic  *Additional and/or pertinent findings included in MDM below   (all labs ordered are listed, but only abnormal results are displayed) Labs Reviewed  CBC WITH DIFFERENTIAL/PLATELET - Abnormal; Notable for the following components:      Result Value   Hemoglobin 12.6 (*)    All other components within normal limits  COMPREHENSIVE METABOLIC PANEL WITH GFR - Abnormal; Notable for the following components:   Sodium 133 (*)    Glucose, Bld 113 (*)    BUN 31 (*)    Creatinine, Ser 1.55 (*)    Alkaline Phosphatase 35 (*)    GFR, Estimated 43 (*)    All other components within normal limits  MAGNESIUM  URINALYSIS, W/ REFLEX TO CULTURE (INFECTION SUSPECTED)  CBG MONITORING, ED  TROPONIN I (HIGH SENSITIVITY)  TROPONIN I (HIGH SENSITIVITY)    EKG: None  Radiology: [facility] Chest Port 1 View Result Date: 03/19/2024 EXAM: 1 VIEW  XRAY OF THE CHEST 03/19/2024 06:58:00 PM COMPARISON: 06/16/2022 CLINICAL HISTORY: Syncope. Triage notes: PT BIB EMS, initially called out for weakness, then had a syncopal episode, but per family is back to baseline, history of dementia. Family advised EMS that he has not been eating and drinking.; BP 152/90; HR 76; O2 96% RA; CBG 121 FINDINGS: LUNGS AND PLEURA: Hazy airspace and interstitial opacities in the bilateral lower lungs, greater on the left than the right, suspicious for pneumonia and/or aspiration. No pleural effusion. No pneumothorax. HEART AND MEDIASTINUM:  Stable cardiomediastinal silhouette. BONES AND SOFT TISSUES: No acute osseous abnormality. IMPRESSION: 1. Hazy airspace and interstitial opacities in the bilateral lower lungs, suspicious for pneumonia and/or aspiration. Electronically signed by: Norman Gatlin MD 03/19/2024 07:56 PM EDT RP Workstation: [id number]     Procedures   Medications Ordered in the ED  Ampicillin -Sulbactam (UNASYN ) 3 g in sodium chloride  0.9 % 100 mL IVPB (has no administration in time range)  sodium chloride  0.9 % bolus 500 mL (has no administration in time range)                                    Medical Decision Making Amount and/or Complexity of Data Reviewed Labs: ordered. Radiology: ordered.  Risk Decision regarding hospitalization.   Initial Impression and Ddx 89 year old well-appearing male presenting for syncope and weakness.  Exam was largely unremarkable.  DDx includes arrhythmia, ACS, PE, stroke, electrolyte derangement, sepsis, intra-abdominal infection or obstruction, other. Patient PMH that increases complexity of ED encounter: dementia, thoracic aortic aneurysm, history of PE, first-degree heart block, bladder cancer  Interpretation of Diagnostics - I independent reviewed and interpreted the labs as followed: Reduced GFR  - I independently visualized the following imaging with scope of interpretation limited to determining acute life threatening conditions related to emergency care: CXR, which revealed findings suggestive of bilateral pneumonia and/or aspiration  -I personally reviewed and interpreted EKG which revealed sinus rhythm  Patient Reassessment and Ultimate Disposition/Management Workup suggestive of pneumonia without sepsis.  Severe risk for 30-day mortality per curb 65 score.  Started on Unasyn  for concerns of aspiration.  Admitted to hospital medicine with Dr. Alfornia.  Hemodynamically stable and in no acute distress at this time.  Patient management required discussion with  the following services or consulting groups:  Hospitalist Service  Complexity of Problems Addressed Acute complicated illness or Injury  Additional Data Reviewed and Analyzed Further history obtained from: Past medical history and medications listed in the EMR and Prior ED visit notes  Patient Encounter Risk Assessment Consideration of hospitalization      Final diagnoses:  Pneumonia due to infectious organism, unspecified laterality, unspecified part of lung    ED Discharge Orders     None          Luca Burston K, PA-C 03/19/24 2105    Neysa Caron PARAS, DO 03/19/24 2332

## 2024-03-19 NOTE — ED Notes (Signed)
 CCMD called.

## 2024-03-20 ENCOUNTER — Inpatient Hospital Stay (HOSPITAL_COMMUNITY)

## 2024-03-20 ENCOUNTER — Observation Stay (HOSPITAL_COMMUNITY)

## 2024-03-20 DIAGNOSIS — R569 Unspecified convulsions: Secondary | ICD-10-CM | POA: Diagnosis not present

## 2024-03-20 DIAGNOSIS — J690 Pneumonitis due to inhalation of food and vomit: Secondary | ICD-10-CM

## 2024-03-20 DIAGNOSIS — I440 Atrioventricular block, first degree: Secondary | ICD-10-CM | POA: Diagnosis present

## 2024-03-20 DIAGNOSIS — K4020 Bilateral inguinal hernia, without obstruction or gangrene, not specified as recurrent: Secondary | ICD-10-CM | POA: Diagnosis not present

## 2024-03-20 DIAGNOSIS — E785 Hyperlipidemia, unspecified: Secondary | ICD-10-CM | POA: Diagnosis present

## 2024-03-20 DIAGNOSIS — Z79899 Other long term (current) drug therapy: Secondary | ICD-10-CM | POA: Diagnosis not present

## 2024-03-20 DIAGNOSIS — Z86711 Personal history of pulmonary embolism: Secondary | ICD-10-CM | POA: Diagnosis not present

## 2024-03-20 DIAGNOSIS — N281 Cyst of kidney, acquired: Secondary | ICD-10-CM | POA: Diagnosis not present

## 2024-03-20 DIAGNOSIS — R55 Syncope and collapse: Secondary | ICD-10-CM | POA: Diagnosis not present

## 2024-03-20 DIAGNOSIS — Z833 Family history of diabetes mellitus: Secondary | ICD-10-CM | POA: Diagnosis not present

## 2024-03-20 DIAGNOSIS — Z66 Do not resuscitate: Secondary | ICD-10-CM | POA: Diagnosis present

## 2024-03-20 DIAGNOSIS — Z8551 Personal history of malignant neoplasm of bladder: Secondary | ICD-10-CM | POA: Diagnosis not present

## 2024-03-20 DIAGNOSIS — J439 Emphysema, unspecified: Secondary | ICD-10-CM | POA: Diagnosis present

## 2024-03-20 DIAGNOSIS — D649 Anemia, unspecified: Secondary | ICD-10-CM | POA: Diagnosis present

## 2024-03-20 DIAGNOSIS — Z86718 Personal history of other venous thrombosis and embolism: Secondary | ICD-10-CM | POA: Diagnosis not present

## 2024-03-20 DIAGNOSIS — J1282 Pneumonia due to coronavirus disease 2019: Secondary | ICD-10-CM | POA: Diagnosis present

## 2024-03-20 DIAGNOSIS — F0390 Unspecified dementia without behavioral disturbance: Secondary | ICD-10-CM | POA: Diagnosis present

## 2024-03-20 DIAGNOSIS — N1330 Unspecified hydronephrosis: Secondary | ICD-10-CM | POA: Diagnosis not present

## 2024-03-20 DIAGNOSIS — E860 Dehydration: Secondary | ICD-10-CM | POA: Diagnosis present

## 2024-03-20 DIAGNOSIS — U071 COVID-19: Secondary | ICD-10-CM | POA: Diagnosis present

## 2024-03-20 DIAGNOSIS — I7121 Aneurysm of the ascending aorta, without rupture: Secondary | ICD-10-CM | POA: Diagnosis present

## 2024-03-20 DIAGNOSIS — Z87891 Personal history of nicotine dependence: Secondary | ICD-10-CM | POA: Diagnosis not present

## 2024-03-20 DIAGNOSIS — I10 Essential (primary) hypertension: Secondary | ICD-10-CM | POA: Diagnosis present

## 2024-03-20 DIAGNOSIS — Z7901 Long term (current) use of anticoagulants: Secondary | ICD-10-CM | POA: Diagnosis not present

## 2024-03-20 DIAGNOSIS — E871 Hypo-osmolality and hyponatremia: Secondary | ICD-10-CM

## 2024-03-20 DIAGNOSIS — Z8249 Family history of ischemic heart disease and other diseases of the circulatory system: Secondary | ICD-10-CM | POA: Diagnosis not present

## 2024-03-20 DIAGNOSIS — R9082 White matter disease, unspecified: Secondary | ICD-10-CM | POA: Diagnosis not present

## 2024-03-20 DIAGNOSIS — N179 Acute kidney failure, unspecified: Secondary | ICD-10-CM | POA: Diagnosis not present

## 2024-03-20 LAB — BASIC METABOLIC PANEL WITH GFR
Anion gap: 8 (ref 5–15)
BUN: 29 mg/dL — ABNORMAL HIGH (ref 8–23)
CO2: 24 mmol/L (ref 22–32)
Calcium: 9.1 mg/dL (ref 8.9–10.3)
Chloride: 103 mmol/L (ref 98–111)
Creatinine, Ser: 1.42 mg/dL — ABNORMAL HIGH (ref 0.61–1.24)
GFR, Estimated: 48 mL/min — ABNORMAL LOW (ref >60)
Glucose, Bld: 95 mg/dL (ref 70–99)
Potassium: 4.2 mmol/L (ref 3.5–5.1)
Sodium: 135 mmol/L (ref 135–145)

## 2024-03-20 LAB — RESP PANEL BY RT-PCR (RSV, FLU A&B, COVID)  RVPGX2
Influenza A by PCR: NEGATIVE
Influenza B by PCR: NEGATIVE
Resp Syncytial Virus by PCR: NEGATIVE
SARS Coronavirus 2 by RT PCR: POSITIVE — AB

## 2024-03-20 LAB — URINALYSIS, W/ REFLEX TO CULTURE (INFECTION SUSPECTED)
Bilirubin Urine: NEGATIVE
Glucose, UA: NEGATIVE mg/dL
Hgb urine dipstick: NEGATIVE
Ketones, ur: NEGATIVE mg/dL
Leukocytes,Ua: NEGATIVE
Nitrite: NEGATIVE
Protein, ur: NEGATIVE mg/dL
Specific Gravity, Urine: 1.019 (ref 1.005–1.030)
pH: 6 (ref 5.0–8.0)

## 2024-03-20 LAB — D-DIMER, QUANTITATIVE: D-Dimer, Quant: 0.59 ug{FEU}/mL — ABNORMAL HIGH (ref 0.00–0.50)

## 2024-03-20 LAB — C-REACTIVE PROTEIN: CRP: 9.7 mg/dL — ABNORMAL HIGH (ref <1.0)

## 2024-03-20 LAB — MRSA NEXT GEN BY PCR, NASAL: MRSA by PCR Next Gen: NOT DETECTED

## 2024-03-20 LAB — PROCALCITONIN: Procalcitonin: 0.15 ng/mL

## 2024-03-20 LAB — STREP PNEUMONIAE URINARY ANTIGEN: Strep Pneumo Urinary Antigen: NEGATIVE

## 2024-03-20 MED ORDER — RIVAROXABAN 10 MG PO TABS
20.0000 mg | ORAL_TABLET | Freq: Every morning | ORAL | Status: DC
Start: 2024-03-21 — End: 2024-03-22
  Administered 2024-03-21 – 2024-03-22 (×2): 20 mg via ORAL
  Filled 2024-03-20 (×2): qty 2

## 2024-03-20 MED ORDER — ADULT MULTIVITAMIN W/MINERALS CH
1.0000 | ORAL_TABLET | Freq: Every day | ORAL | Status: DC
  Administered 2024-03-20 – 2024-03-24 (×5): 1 via ORAL
  Filled 2024-03-20 (×5): qty 1

## 2024-03-20 MED ORDER — RIVAROXABAN 15 MG PO TABS
15.0000 mg | ORAL_TABLET | Freq: Every morning | ORAL | Status: DC
  Administered 2024-03-20: 15 mg via ORAL
  Filled 2024-03-20: qty 1

## 2024-03-20 MED ORDER — LATANOPROST 0.005 % OP SOLN
1.0000 [drp] | Freq: Every day | OPHTHALMIC | Status: DC
  Administered 2024-03-20 – 2024-03-23 (×4): 1 [drp] via OPHTHALMIC
  Filled 2024-03-20: qty 2.5

## 2024-03-20 MED ORDER — ACETAMINOPHEN 325 MG PO TABS
650.0000 mg | ORAL_TABLET | Freq: Four times a day (QID) | ORAL | Status: DC | PRN
  Administered 2024-03-23 (×2): 650 mg via ORAL
  Filled 2024-03-20 (×2): qty 2

## 2024-03-20 MED ORDER — DEXAMETHASONE SODIUM PHOSPHATE 10 MG/ML IJ SOLN
6.0000 mg | INTRAMUSCULAR | Status: DC
  Administered 2024-03-20 – 2024-03-24 (×5): 6 mg via INTRAVENOUS
  Filled 2024-03-20 (×5): qty 1

## 2024-03-20 MED ORDER — SODIUM CHLORIDE 0.9 % IV SOLN
100.0000 mg | Freq: Every day | INTRAVENOUS | Status: AC
  Administered 2024-03-21 – 2024-03-22 (×2): 100 mg via INTRAVENOUS
  Filled 2024-03-20 (×2): qty 20

## 2024-03-20 MED ORDER — SODIUM CHLORIDE 0.9 % IV SOLN: INTRAVENOUS | Status: AC

## 2024-03-20 MED ORDER — SODIUM CHLORIDE 0.9 % IV SOLN
200.0000 mg | Freq: Once | INTRAVENOUS | Status: AC
  Administered 2024-03-20: 200 mg via INTRAVENOUS
  Filled 2024-03-20: qty 40

## 2024-03-20 MED ORDER — CHLORHEXIDINE GLUCONATE CLOTH 2 % EX PADS
6.0000 | MEDICATED_PAD | Freq: Every day | CUTANEOUS | Status: DC
  Administered 2024-03-20 – 2024-03-24 (×5): 6 via TOPICAL

## 2024-03-20 MED ORDER — SODIUM CHLORIDE 0.9 % IV SOLN
3.0000 g | Freq: Four times a day (QID) | INTRAVENOUS | Status: DC
  Administered 2024-03-20 – 2024-03-23 (×13): 3 g via INTRAVENOUS
  Filled 2024-03-20 (×13): qty 8

## 2024-03-20 MED ORDER — ACETAMINOPHEN 650 MG RE SUPP: 650.0000 mg | Freq: Four times a day (QID) | RECTAL | Status: DC | PRN

## 2024-03-20 NOTE — Progress Notes (Signed)
 Transition of Care [facility]) - Inpatient Brief Assessment   Patient Details  Name: Connor Morris MRN: [id number] Date of Birth: May 20, 1934  Transition of Care [facility]) CM/SW Contact:    Rosaline JONELLE Joe, RN Phone Number: 03/20/2024, 3:37 PM   Clinical Narrative: Patient admitted from home with family with respiratory infection and tested positive for COVID.  Patient with history of dementia and patient unable to participate in decisions regarding his care.  I called and spoke with the patient's daughter, [name] by phone and she declined SNF placement and would like the patient to return home with home health when stable.  Patient is provided with 24 hour care at the home through daughter, brother, cousin and uncle.  The patient lives with the brother at the home and on the days that the brother is working - family provide 24 hour care.  The patient's daughter was offered Medicare choice regarding home health and the daughter chose [facility]. I called Darleene, RNCM with [facility] and he accepted for [facility] PT/OT.  HH orders placed.  DME at the home includes RW, [place] and bedside toilet.  CM with IP Care management  will continue to follow the patient to return home when medically stable.   Transition of Care Asessment: Insurance and Status: (P) Insurance coverage has been reviewed Patient has primary care physician: (P) Yes Home environment has been reviewed: (P) from home with daughter and other family including cousin, uncle Prior level of function:: (P) RW Prior/Current Home Services: (P) No current home services Social Drivers of Health Review: (P) SDOH reviewed needs interventions Readmission risk has been reviewed: (P) Yes Transition of care needs: (P) transition of care needs identified, TOC will continue to follow

## 2024-03-20 NOTE — Procedures (Signed)
 Patient Name: Connor Morris  MRN: [id number]  Epilepsy Attending: Pastor Falling  Referring Physician/Provider: No ref. provider found      Date: 03/20/2024 Duration: 23 minutes   Patient history: 89 y.o. male with medical history significant of sinus bradycardia, hyperlipidemia, thoracic ascending aortic aneurysm, history of DVT/PE on Xarelto , bladder and prostate cancer, dementia, chronic normocytic anemia presents to the ED via EMS for evaluation of generalized weakness and syncope. EEG to evaluate for seizure.   Level of alertness: Awake, drowsy,  AEDs during EEG study: None   Technical aspects: This EEG study was done with scalp electrodes positioned according to the 10-20 International system of electrode placement. Electrical activity was reviewed with band pass filter of 1-70Hz , sensitivity of 7 uV/mm, display speed of 45mm/sec with a 60Hz  notched filter applied as appropriate. EEG data were recorded continuously and digitally stored.  Video monitoring was available and reviewed as appropriate.  Description: The posterior dominant rhythm consists of 6-7 Hz activity of moderate voltage (25-35 uV) seen predominantly in posterior head regions, symmetric and reactive to eye opening and eye closing. Drowsiness was characterized by attenuation of the posterior background rhythm. Sleep was not seen.  Hyperventilation and photic stimulation were not performed.     ABNORMALITY - Continuous slow, generalized  IMPRESSION: This study is suggestive of mild diffuse encephalopathy, nonspecific etiology but likely related to sedation, toxic-metabolic etiology. No seizures or epileptiform discharges were seen throughout the recording.   Pastor Falling MD Neurology

## 2024-03-20 NOTE — Progress Notes (Signed)
 Patient returned from CT

## 2024-03-20 NOTE — Progress Notes (Signed)
 Patient admitted early morning hours by nighttime hospitalist.  See H&P assessment plan.  Agree with the assessment. In brief, 89 year old with history of DVT PE on Xarelto , bladder and prostate cancer, dementia who lives at home brought to the emergency room with generalized weakness and syncope, too weak to get up and walk and not eating or drinking for the last few days.  Clearing his throat and coughing and has a runny nose.  Also reported 1 episode of shaking where he had to be sat down and then he became transiently unresponsive.  In the emergency room hemodynamically stable.  On 2 L oxygen.  Chest x-ray with hazy airspace and interstitial opacities in bilateral lower lobes left more than right suspicious for pneumonia or aspiration.  COVID test was positive.  Patient started on remdesivir  after consented from his daughter.   COVID-19 positive test (U07.1, COVID-19) with Acute Pneumonia (J12.89, Other viral pneumonia) Continue to monitor due to significant symptoms. chest physiotherapy, incentive spirometry, deep breathing exercises, sputum induction, mucolytic's and bronchodilators. Supplemental oxygen to keep saturations more than 90%. Covid directed therapy with , steroids, Decadron  remdesivir , started due to hypoxemia administered need of oxygen antibiotics, concomitant aspiration suspected.  On Unasyn . Monitor closely. Mobilize with PT OT. Speech therapy following, started on modified diet.  Syncope: Likely secondary to above.  Telemetry monitor without any arrhythmias.  Head CT normal.  Echocardiogram pending.  EEG without evidence of seizure.  Monitor.  AKI: On maintenance fluid.  Already trending down.  History of DVT and PE: Head CT negative for trauma.  Resume.  Patient is on steroids and remdesivir .  He will need more than 2 midnights of hospitalization.   Total time spent: 35 minutes.  Same-day admit.  No charge visit.

## 2024-03-20 NOTE — Significant Event (Addendum)
 Patient's COVID test came back positive.  Patient has been symptomatic only for last 2 days with runny nose shortness of breath and weakness.  Patient is hypoxic with chest x-ray showing infiltrates.  Discussed with patient's daughter Ms. [name] Pinal.  Agrees for treatment for COVID.  Started on Remdesivir  and decadron .  Airborne and contact precautions.  Will check CRP and D-dimer.  Redia Cleaver

## 2024-03-20 NOTE — Progress Notes (Signed)
 EEG complete - results pending

## 2024-03-20 NOTE — H&P (Signed)
 History and Physical    Connor Morris FMW:[id number] DOB: May 31, 1934 DOA: 03/19/2024  PCP: Larnell Hamilton, MD  Patient coming from: Home  Chief Complaint: Syncope  HPI: Connor Morris is a 89 y.o. male with medical history significant of sinus bradycardia, hyperlipidemia, thoracic ascending aortic aneurysm, history of DVT/PE on Xarelto , bladder and prostate cancer, dementia, chronic normocytic anemia presents to the ED via EMS for evaluation of generalized weakness and syncope. Patient has dementia and is not able to give any history.  He is oriented to self only.  Denies dizziness, chest pain, shortness of breath, nausea, vomiting, abdominal pain, diarrhea, or any urinary symptoms.  I spoke to his daughter Connor Morris over the phone who informed me that since yesterday patient is having generalized weakness.  He has been too weak to get up and walk.  Not eating or drinking.  He has been clearing his throat and coughing and has a runny nose.  Daughter states when EMS arrived and tried to help the patient get up, he started shaking so they helped him sit down and then he became unresponsive for several seconds.  She is not aware of him having history of seizures in the past.  Daughter does report patient having history of aspiration pneumonia in the past but no recent episodes of vomiting reported.  States he has dementia and she thinks his mental status is at baseline although he has had confusion in the past related to having UTI.  Daughter states patient stays with her brother and he gives him all his medications and she confirms that he has not missed Xarelto .  ED Course: Hemodynamically stable on arrival.  Labs showing no leukocytosis, hemoglobin 12.6 (stable), sodium 133, glucose 113, BUN 31, creatinine 1.5 (baseline 1.0), troponin 16> 18, UA pending.  Chest x-ray showing hazy airspace and interstitial opacities in the bilateral lower lungs (L >R) suspicious for pneumonia and/or aspiration.  EKG  showing sinus rhythm with first-degree AV block, LAFB, and no significant change since previous tracing.  Patient was given Unasyn  and 500 mL normal saline.  Review of Systems:  Review of Systems  All other systems reviewed and are negative.   Past Medical History:  Diagnosis Date   1st degree AV block    AAA (abdominal aortic aneurysm) (HCC) 03/2018   followed by dr lucas--- last CTA  4.6cm   Anticoagulated 03/2018   xarelto  for hx pe/ dvt--- managed by pcp   Arthritis    Cancer (HCC)    Emphysema lung (HCC)    Hiatal hernia    History of bladder cancer    urologist--- dr rosalind   s/p TURBT feb and march 2021   History of DVT of lower extremity 03/2018   LLE   History of prostate cancer 2002   s/p radioactive seed implants 04-11-2001   History of pulmonary embolus (PE) 03/2018   left side--  on xarelto    History of sepsis 01/30/2020   hospital admission in epic;  due to UTI   Impaired memory    Retained ureteral stent    right side   Sinus bradycardia 2018   cardiologist--- dr calhoun   Urgency of urination     Past Surgical History:  Procedure Laterality Date   CATARACT EXTRACTION W/ INTRAOCULAR LENS IMPLANT Right 2020   CYSTOSCOPY N/A 10/30/2019   Procedure: CYSTOSCOPY;  Surgeon: Sherrilee Belvie CROME, MD;  Location: [facility];  Service: Urology;  Laterality: N/A;   CYSTOSCOPY W/ RETROGRADES Bilateral 09/20/2019  Procedure: CYSTOSCOPY WITH RETROGRADE PYELOGRAM;  Surgeon: Sherrilee Belvie CROME, MD;  Location: [facility];  Service: Urology;  Laterality: Bilateral;   CYSTOSCOPY W/ URETERAL STENT REMOVAL N/A 03/25/2020   Procedure: CYSTOSCOPY WITH  REMOVAL OF RETAINED FOLEY CATHETER,  FULGERATION;  Surgeon: Rosalind Zachary NOVAK, MD;  Location: [facility];  Service: Urology;  Laterality: N/A;   FOOT SURGERY  yrs ago   RADIOACTIVE PROSTATE SEED IMPLANTS  04-11-2001  dr aleene  @[email or web address]    TRANSURETHRAL RESECTION OF BLADDER TUMOR N/A  09/20/2019   Procedure: TRANSURETHRAL RESECTION OF BLADDER TUMOR (TURBT);  Surgeon: Sherrilee Belvie CROME, MD;  Location: [facility];  Service: Urology;  Laterality: N/A;  1 HR   TRANSURETHRAL RESECTION OF BLADDER TUMOR N/A 10/30/2019   Procedure: TRANSURETHRAL RESECTION OF BLADDER TUMOR (TURBT), REMOVAL OF RIGHT STENT;  Surgeon: Sherrilee Belvie CROME, MD;  Location: [facility];  Service: Urology;  Laterality: N/A;     reports that he quit smoking about 38 years ago. His smoking use included cigarettes. He started smoking about 58 years ago. He has a 20 pack-year smoking history. He has never used smokeless tobacco. He reports that he does not drink alcohol  and does not use drugs.  Allergies  Allergen Reactions   Aspirin      Unknown reaction    Family History  Problem Relation Age of Onset   Diabetes Sister    Hypertension Mother        family history   Healthy Father    Colon cancer Neg Hx     Prior to Admission medications   Medication Sig Start Date End Date Taking? Authorizing Provider  acetaminophen  (TYLENOL ) 325 MG tablet Take 2 tablets (650 mg total) by mouth every 6 (six) hours as needed for mild pain (or Fever >/= 101). 03/10/22  Yes Sheikh, Omair Latif, DO  feeding supplement (ENSURE ENLIVE / ENSURE PLUS) LIQD Take 237 mLs by mouth 2 (two) times daily between meals. Patient taking differently: Take 237 mLs by mouth daily. 03/10/22  Yes Sheikh, Omair Latif, DO  latanoprost  (XALATAN ) 0.005 % ophthalmic solution Place 1 drop into both eyes at bedtime. 09/28/21  Yes [provider]  Multiple Vitamins-Minerals (MULTIVITAMIN WITH MINERALS) tablet Take 1 tablet by mouth daily.    Yes [provider]  rivaroxaban  (XARELTO ) 20 MG TABS tablet Take 20 mg by mouth in the morning.   Yes [provider]    Physical Exam: Vitals:   03/19/24 2130 03/19/24 2210 03/19/24 2252 03/20/24 0020  BP: 124/61   (!) 133/96  Pulse: 68   64  Resp:  17   16  Temp:  98.1 F (36.7 C)  98.5 F (36.9 C)  TempSrc:  Oral  Oral  SpO2: 100%   100%  Weight:   71.6 kg   Height:   5' 9 (1.753 m)     Physical Exam Vitals reviewed.  Constitutional:      General: He is not in acute distress. HENT:     Head: Normocephalic and atraumatic.  Eyes:     Extraocular Movements: Extraocular movements intact.  Cardiovascular:     Rate and Rhythm: Normal rate and regular rhythm.     Heart sounds: Normal heart sounds.  Pulmonary:     Effort: Pulmonary effort is normal. No respiratory distress.     Breath sounds: No wheezing or rales.  Abdominal:     General: Bowel sounds are normal. There is no distension.  Palpations: Abdomen is soft.     Tenderness: There is no abdominal tenderness. There is no guarding.  Musculoskeletal:     Cervical back: Normal range of motion.     Right lower leg: No edema.     Left lower leg: No edema.  Skin:    General: Skin is warm and dry.  Neurological:     General: No focal deficit present.     Mental Status: He is alert.     Cranial Nerves: No cranial nerve deficit.     Sensory: No sensory deficit.     Motor: No weakness.     Comments: Following commands appropriately, no focal neurodeficit     Labs on Admission: I have personally reviewed following labs and imaging studies  CBC: Recent Labs  Lab 03/19/24 1804  WBC 6.3  NEUTROABS 4.3  HGB 12.6*  HCT 40.3  MCV 89.4  PLT 153   Basic Metabolic Panel: Recent Labs  Lab 03/19/24 1804  NA 133*  K 4.4  CL 100  CO2 22  GLUCOSE 113*  BUN 31*  CREATININE 1.55*  CALCIUM 9.4  MG 1.8   GFR: Estimated Creatinine Clearance: 32.9 mL/min (A) (by C-G formula based on SCr of 1.55 mg/dL (H)). Liver Function Tests: Recent Labs  Lab 03/19/24 1804  AST 35  ALT 16  ALKPHOS 35*  BILITOT 1.2  PROT 7.6  ALBUMIN 3.5   No results for input(s): LIPASE, AMYLASE in the last 168 hours. No results for input(s): AMMONIA in the last 168  hours. Coagulation Profile: No results for input(s): INR, PROTIME in the last 168 hours. Cardiac Enzymes: No results for input(s): CKTOTAL, CKMB, CKMBINDEX, TROPONINI in the last 168 hours. BNP (last 3 results) No results for input(s): PROBNP in the last 8760 hours. HbA1C: No results for input(s): HGBA1C in the last 72 hours. CBG: No results for input(s): GLUCAP in the last 168 hours. Lipid Profile: No results for input(s): CHOL, HDL, LDLCALC, TRIG, CHOLHDL, LDLDIRECT in the last 72 hours. Thyroid  Function Tests: No results for input(s): TSH, T4TOTAL, FREET4, T3FREE, THYROIDAB in the last 72 hours. Anemia Panel: No results for input(s): VITAMINB12, FOLATE, FERRITIN, TIBC, IRON, RETICCTPCT in the last 72 hours. Urine analysis:    Component Value Date/Time   COLORURINE YELLOW 03/26/2022 2024   APPEARANCEUR HAZY (A) 03/26/2022 2024   LABSPEC 1.009 03/26/2022 2024   PHURINE 6.0 03/26/2022 2024   GLUCOSEU NEGATIVE 03/26/2022 2024   HGBUR NEGATIVE 03/26/2022 2024   BILIRUBINUR NEGATIVE 03/26/2022 2024   KETONESUR NEGATIVE 03/26/2022 2024   PROTEINUR NEGATIVE 03/26/2022 2024   UROBILINOGEN 0.2 02/26/2019 1011   NITRITE NEGATIVE 03/26/2022 2024   LEUKOCYTESUR LARGE (A) 03/26/2022 2024    Radiological Exams on Admission: DG Chest Port 1 View Result Date: 03/19/2024 EXAM: 1 VIEW XRAY OF THE CHEST 03/19/2024 06:58:00 PM COMPARISON: 06/16/2022 CLINICAL HISTORY: Syncope. Triage notes: PT BIB EMS, initially called out for weakness, then had a syncopal episode, but per family is back to baseline, history of dementia. Family advised EMS that he has not been eating and drinking.; BP 152/90; HR 76; O2 96% RA; CBG 121 FINDINGS: LUNGS AND PLEURA: Hazy airspace and interstitial opacities in the bilateral lower lungs, greater on the left than the right, suspicious for pneumonia and/or aspiration. No pleural effusion. No pneumothorax. HEART AND  MEDIASTINUM: Stable cardiomediastinal silhouette. BONES AND SOFT TISSUES: No acute osseous abnormality. IMPRESSION: 1. Hazy airspace and interstitial opacities in the bilateral lower lungs, suspicious for pneumonia and/or aspiration.  Electronically signed by: Norman Gatlin MD 03/19/2024 07:56 PM EDT RP Workstation: [id number]    Assessment and Plan  Bilateral lower lobe pneumonia/possible aspiration pneumonia Acute hypoxemic respiratory failure No fever, leukocytosis, or signs of sepsis.  Currently requiring 2 L [place] to maintain sats in the 90s, no respiratory distress.  Continue Unasyn .  Check MRSA PCR screen, procalcitonin level, strep pneumo/Legionella urinary antigens.  Check for COVID/flu/RSV.  Keep n.p.o., IV fluid hydration, aspiration precautions, and SLP eval in the morning.  Syncope Patient does have a history of sinus bradycardia but not bradycardic at present.  Troponin 16> 18.  ACS less likely as patient is not endorsing chest pain and EKG without acute ischemic changes.  Recurrent PE less likely given no tachycardia or chest pain.  Daughter confirms that patient has been receiving Xarelto .  Continue cardiac monitoring.  Check orthostatics.  Stat CT head ordered.  Echocardiogram and EEG ordered.  AKI Likely prerenal from dehydration in the setting of poor p.o. intake.  BUN 31, creatinine 1.5 (baseline 1.0).  Continue IV fluid hydration and monitor renal function.  Avoid nephrotoxic agents.  Mild hyponatremia Likely due to poor p.o. intake.  IV fluid hydration with normal saline and monitor labs.  Generalized weakness Likely related to pneumonia but UA has been ordered to rule out UTI.  PT/OT eval, fall precautions.  History of DVT/PE Hold Xarelto  until CT head is done.  Dementia Delirium precautions.  Chronic normocytic anemia Hemoglobin stable.  DVT prophylaxis: Holding Xarelto  until CT head is done. Code Status: DNR/DNI (discussed with the patient's daughter) Family  Communication: Spoke to patient's daughter Connor Morris over the phone. Level of care: Telemetry bed Admission status: It is my clinical opinion that referral for OBSERVATION is reasonable and necessary in this patient based on the above information provided. The aforementioned taken together are felt to place the patient at high risk for further clinical deterioration. However, it is anticipated that the patient may be medically stable for discharge from the hospital within 24 to 48 hours.  Editha Ram MD [facility]  If 7PM-7AM, please contact night-coverage [email or web address]  03/20/2024, 12:33 AM

## 2024-03-20 NOTE — Evaluation (Signed)
 Occupational Therapy Evaluation Patient Details Name: Connor Morris MRN: [id number] DOB: Jan 11, 1934 Today's Date: 03/20/2024   History of Present Illness   Pt is an 89 y/o male presenting to ED due to progressive weakness and syncope with questionable seizure activity. CT chest shows potential aspiration PNA. EEG negative, COVID+. PMH: dementia, bladder and prostate cancer, hx of DVT/PE, thoracic aortic aneurysm     Clinical Impressions PTA, pt lives with son, reports typically Modified Independent with ADLs/mobility with intermittent RW vs cane use. Family not present at time of eval to confirm PLOF. Pt presents now with deficits in standing balance, strength, endurance and cognition. Pt requires up to Mod A for bed mobility and transfers; no RW present at time of eval so unable to safely progress mobility (located RW and placed at pt room for next session). Pt requires Min A for UB ADL and up to Total A for LB ADLs including cleanup after bowel incontinence today. Recommend consideration of continued inpatient follow up therapy, <3 hours/day at DC unless family able to provide the physical assistance pt currently requires.     If plan is discharge home, recommend the following:   A lot of help with bathing/dressing/bathroom;A lot of help with walking and/or transfers     Functional Status Assessment   Patient has had a recent decline in their functional status and demonstrates the ability to make significant improvements in function in a reasonable and predictable amount of time.     Equipment Recommendations   BSC/3in1     Recommendations for Other Services         Precautions/Restrictions   Precautions Precautions: Fall Restrictions Weight Bearing Restrictions Per Provider Order: No     Mobility Bed Mobility Overal bed mobility: Needs Assistance Bed Mobility: Supine to Sit, Sit to Supine     Supine to sit: Mod assist, HOB elevated, Used rails Sit to supine:  Min assist   General bed mobility comments: sequencing cues and assist for lifting trunk and scooting EOB with pad. Min A for BLE back to bed    Transfers Overall transfer level: Needs assistance Equipment used: 1 person hand held assist Transfers: Sit to/from Stand Sit to Stand: Mod assist           General transfer comment: face to face transfer to assist in standing as no RW available in room. cues to hold therapist's arms with gradual improvements in standing balance while assisted with posterior hygiene      Balance Overall balance assessment: Needs assistance Sitting-balance support: No upper extremity supported, Feet supported Sitting balance-Leahy Scale: Fair     Standing balance support: Single extremity supported, During functional activity Standing balance-Leahy Scale: Poor                             ADL either performed or assessed with clinical judgement   ADL Overall ADL's : Needs assistance/impaired Eating/Feeding: Set up;Supervision/ safety;Sitting   Grooming: Set up;Supervision/safety;Sitting;Wash/dry face   Upper Body Bathing: Minimal assistance;Sitting   Lower Body Bathing: Maximal assistance;Sit to/from stand   Upper Body Dressing : Minimal assistance;Sitting   Lower Body Dressing: Maximal assistance;Sit to/from stand;Sitting/lateral leans       Toileting- Clothing Manipulation and Hygiene: Total assistance;Sit to/from stand Toileting - Clothing Manipulation Details (indicate cue type and reason): incontinence of bowel upon standing, Total A for cleanup at bedside       General ADL Comments: Limited in OOB mobility progression as  no RW available in room at time of eval- located after eval and left bedside room door to be brought in (airborne isolation room)     Vision Ability to See in Adequate Light: 0 Adequate Patient Visual Report: No change from baseline Vision Assessment?: No apparent visual deficits     Perception          Praxis         Pertinent Vitals/Pain       Extremity/Trunk Assessment Upper Extremity Assessment Upper Extremity Assessment: Generalized weakness;Right hand dominant   Lower Extremity Assessment Lower Extremity Assessment: Defer to PT evaluation   Cervical / Trunk Assessment Cervical / Trunk Assessment: Kyphotic   Communication Communication Communication: Impaired Factors Affecting Communication: Reduced clarity of speech   Cognition Arousal: Alert Behavior During Therapy: WFL for tasks assessed/performed Cognition: No family/caregiver present to determine baseline             OT - Cognition Comments: hx of dementia, pleasant, able to follow commands, cues needed for sequencing and pt with some decreased insight into deficits. memory deficits                 Following commands: Impaired Following commands impaired: Only follows one step commands consistently, Follows one step commands with increased time     Cueing  General Comments   Cueing Techniques: Verbal cues;Gestural cues      Exercises     Shoulder Instructions      Home Living Family/patient expects to be discharged to:: Private residence Living Arrangements: Children Available Help at Discharge: Family Type of Home: House       Home Layout: Two level;Able to live on main level with bedroom/bathroom               Home Equipment: Rolling Walker (2 wheels);Cane - single point   Additional Comments: unclear if pt's family available or if they work      Prior Functioning/Environment Prior Level of Function : Needs assist;Patient poor historian/Family not available             Mobility Comments: pt reports sometimes using a RW or cane if needed ADLs Comments: Pt reports Independence with ADLs, assist with IADLs    OT Problem List: Decreased strength;Decreased activity tolerance;Impaired balance (sitting and/or standing);Decreased cognition   OT Treatment/Interventions:  Self-care/ADL training;Therapeutic exercise;Energy conservation;DME and/or AE instruction;Therapeutic activities;Patient/family education;Balance training      OT Goals(Current goals can be found in the care plan section)   Acute Rehab OT Goals Patient Stated Goal: agreeable to attempt OOB OT Goal Formulation: With patient Time For Goal Achievement: 04/03/24 Potential to Achieve Goals: Good ADL Goals Pt Will Perform Grooming: with supervision;standing Pt Will Perform Lower Body Bathing: with min assist;sit to/from stand;sitting/lateral leans Pt Will Perform Lower Body Dressing: with min assist;sit to/from stand;sitting/lateral leans Pt Will Transfer to Toilet: with contact guard assist;ambulating   OT Frequency:  Min 2X/week    Co-evaluation              AM-PAC OT 6 Clicks Daily Activity     Outcome Measure Help from another person eating meals?: A Little Help from another person taking care of personal grooming?: A Little Help from another person toileting, which includes using toliet, bedpan, or urinal?: Total Help from another person bathing (including washing, rinsing, drying)?: A Lot Help from another person to put on and taking off regular upper body clothing?: A Little Help from another person to put on and taking off  regular lower body clothing?: A Lot 6 Click Score: 14   End of Session Equipment Utilized During Treatment: Gait belt;Oxygen Nurse Communication: Mobility status  Activity Tolerance: Patient tolerated treatment well Patient left: in bed;with call bell/phone within reach;with bed alarm set  OT Visit Diagnosis: Other abnormalities of gait and mobility (R26.89);Unsteadiness on feet (R26.81);Muscle weakness (generalized) (M62.81);Other symptoms and signs involving cognitive function                Time: 9050-8987 OT Time Calculation (min): 23 min Charges:  OT General Charges $OT Visit: 1 Visit OT Evaluation $OT Eval Moderate Complexity: 1  Mod  Mliss NOVAK, OTR/L Acute Rehab Services Office: [phone]   Mliss Fish 03/20/2024, 11:14 AM

## 2024-03-20 NOTE — Progress Notes (Signed)
Unable to complete

## 2024-03-20 NOTE — Care Management Obs Status (Signed)
 MEDICARE OBSERVATION STATUS NOTIFICATION   Patient Details  Name: Connor Morris MRN: [id number] Date of Birth: 26-May-1934   Medicare Observation Status Notification Given:  Yes  I, Claretta Deed, verbally reviewed observation notice with Patient daughter Connor Morris  telephonically at [phone].    Connor Morris 03/20/2024, 1:21 PM

## 2024-03-20 NOTE — Progress Notes (Signed)
 OT Cancellation Note  Patient Details Name: Connor Morris MRN: [id number] DOB: [birth year]   Cancelled Treatment:    Reason Eval/Treat Not Completed: Patient at procedure or test/ unavailable EEG setting up at bedside. Will follow up for OT eval as schedule permits.  Mliss Fish 03/20/2024, 8:37 AM

## 2024-03-20 NOTE — Evaluation (Signed)
 Clinical/Bedside Swallow Evaluation Patient Details  Name: Connor Morris MRN: [id number] Date of Birth: 1933/09/21  Today's Date: 03/20/2024 Time: SLP Start Time (ACUTE ONLY): 0908 SLP Stop Time (ACUTE ONLY): 0918 SLP Time Calculation (min) (ACUTE ONLY): 10 min  Past Medical History:  Past Medical History:  Diagnosis Date   1st degree AV block    AAA (abdominal aortic aneurysm) (HCC) 03/2018   followed by dr lucas--- last CTA  4.6cm   Anticoagulated 03/2018   xarelto  for hx pe/ dvt--- managed by pcp   Arthritis    Cancer (HCC)    Emphysema lung (HCC)    Hiatal hernia    History of bladder cancer    urologist--- dr rosalind   s/p TURBT feb and march 2021   History of DVT of lower extremity 03/2018   LLE   History of prostate cancer 2002   s/p radioactive seed implants 04-11-2001   History of pulmonary embolus (PE) 03/2018   left side--  on xarelto    History of sepsis 01/30/2020   hospital admission in epic;  due to UTI   Impaired memory    Retained ureteral stent    right side   Sinus bradycardia 2018   cardiologist--- dr calhoun   Urgency of urination    Past Surgical History:  Past Surgical History:  Procedure Laterality Date   CATARACT EXTRACTION W/ INTRAOCULAR LENS IMPLANT Right 2020   CYSTOSCOPY N/A 10/30/2019   Procedure: CYSTOSCOPY;  Surgeon: Sherrilee Belvie CROME, MD;  Location: [facility];  Service: Urology;  Laterality: N/A;   CYSTOSCOPY W/ RETROGRADES Bilateral 09/20/2019   Procedure: CYSTOSCOPY WITH RETROGRADE PYELOGRAM;  Surgeon: Sherrilee Belvie CROME, MD;  Location: [facility];  Service: Urology;  Laterality: Bilateral;   CYSTOSCOPY W/ URETERAL STENT REMOVAL N/A 03/25/2020   Procedure: CYSTOSCOPY WITH  REMOVAL OF RETAINED FOLEY CATHETER,  FULGERATION;  Surgeon: rosalind Zachary NOVAK, MD;  Location: [facility];  Service: Urology;  Laterality: N/A;   FOOT SURGERY  yrs ago   RADIOACTIVE PROSTATE SEED IMPLANTS  04-11-2001  dr  aleene  [email or web address]    TRANSURETHRAL RESECTION OF BLADDER TUMOR N/A 09/20/2019   Procedure: TRANSURETHRAL RESECTION OF BLADDER TUMOR (TURBT);  Surgeon: Sherrilee Belvie CROME, MD;  Location: [facility];  Service: Urology;  Laterality: N/A;  1 HR   TRANSURETHRAL RESECTION OF BLADDER TUMOR N/A 10/30/2019   Procedure: TRANSURETHRAL RESECTION OF BLADDER TUMOR (TURBT), REMOVAL OF RIGHT STENT;  Surgeon: Sherrilee Belvie CROME, MD;  Location: [facility];  Service: Urology;  Laterality: N/A;   HPI:  Connor Morris is a 89 y.o. male with medical history significant of sinus bradycardia, hyperlipidemia, thoracic ascending aortic aneurysm, history of DVT/PE, hiatal hernia bladder and prostate cancer, emphysema, dementia, chronic normocytic anemia presents to the ED via EMS for evaluation of generalized weakness and syncope. Found to be COVID +. Per chart dtr report not eating or drinking.  Daughter does report patient having history of aspiration pneumonia in the past but no recent episodes of vomiting reported.   Chest x-ray showing hazy airspace and interstitial opacities in the bilateral lower lungs (L >R) suspicious for pneumonia and/or aspiration. BSE 7/25 suspected esophageal deficits with belching, report of difficulty swallowing certain foods. Reg diet/thin recommended. MBS 2023 decreased mastication, no penetration aspiration, reg/thin rec'd.    Assessment / Plan / Recommendation  Clinical Impression  Pt presented with stable respirations at bedside prior to po's. He was unable to provide specific information re:  swallow function. He is missing scattered dentition in oral cavity. Slower oral manipulation/mastication and transit with puree and solid. He demonstrated several subswallows and swallows somewhat audible. No oral residue noted. There was one delayed throat clear and one delayed cough after po's when SLP re entered to hang sign in room. Pt had noteable eructation that was also present  during swallow assessment 03/09/24 with suspicion of esophageal involvement with known hiatal hernia. Recommend initiate Dys 3, thin, pill whole in applesauce and remain upright 30 min after meals. SLP will continue to follow and attempt to observe with meal. If concern for aspiration would recommend instrumental assessment if needed. SLP Visit Diagnosis: Dysphagia, unspecified (R13.10)    Aspiration Risk  Mild aspiration risk    Diet Recommendation Dysphagia 3 (Mech soft);Thin liquid    Liquid Administration via: Cup;Straw Medication Administration: Whole meds with puree Supervision: Staff to assist with self feeding Compensations: Minimize environmental distractions;Slow rate;Small sips/bites Postural Changes: Seated upright at 90 degrees    Other  Recommendations Recommended Consults: Consider esophageal assessment Oral Care Recommendations: Oral care BID     Assistance Recommended at Discharge    Functional Status Assessment Patient has had a recent decline in their functional status and demonstrates the ability to make significant improvements in function in a reasonable and predictable amount of time.  Frequency and Duration min 2x/week          Prognosis Prognosis for improved oropharyngeal function:  (fair-good) Barriers to Reach Goals: Cognitive deficits      Swallow Study   General Date of Onset: 03/20/24 HPI: Connor Morris is a 89 y.o. male with medical history significant of sinus bradycardia, hyperlipidemia, thoracic ascending aortic aneurysm, history of DVT/PE, hiatal hernia bladder and prostate cancer, emphysema, dementia, chronic normocytic anemia presents to the ED via EMS for evaluation of generalized weakness and syncope. Found to be COVID +. Per chart dtr report not eating or drinking.  Daughter does report patient having history of aspiration pneumonia in the past but no recent episodes of vomiting reported.   Chest x-ray showing hazy airspace and interstitial  opacities in the bilateral lower lungs (L >R) suspicious for pneumonia and/or aspiration. BSE 7/25 suspected esophageal deficits with belching, report of difficulty swallowing certain foods. Reg diet/thin recommended. MBS 2023 decreased mastication, no penetration aspiration, reg/thin rec'd. Type of Study: Bedside Swallow Evaluation Previous Swallow Assessment:  (see HPI) Diet Prior to this Study: NPO Temperature Spikes Noted: No Respiratory Status: Nasal cannula History of Recent Intubation: No Behavior/Cognition: Alert;Cooperative;Pleasant mood;Requires cueing Oral Cavity Assessment: Within Functional Limits Oral Care Completed by SLP: No Oral Cavity - Dentition: Missing dentition (missing scattered) Vision: Functional for self-feeding Self-Feeding Abilities: Needs assist Patient Positioning: Upright in bed Baseline Vocal Quality: Normal Volitional Cough: Strong    Oral/Motor/Sensory Function Overall Oral Motor/Sensory Function: Within functional limits   Ice Chips Ice chips: Not tested   Thin Liquid Thin Liquid: Impaired Presentation: Cup;Straw Pharyngeal  Phase Impairments: Multiple swallows    Nectar Thick Nectar Thick Liquid: Not tested   Honey Thick Honey Thick Liquid: Not tested   Puree Puree: Impaired Oral Phase Functional Implications: Prolonged oral transit (mild) Pharyngeal Phase Impairments: Multiple swallows   Solid     Solid: Impaired Oral Phase Functional Implications: Prolonged oral transit      Dustin Olam Bull 03/20/2024,9:44 AM

## 2024-03-20 NOTE — Plan of Care (Signed)
  Problem: Education: Goal: Knowledge of General Education information will improve Description: Including pain rating scale, medication(s)/side effects and non-pharmacologic comfort measures Outcome: Progressing   Problem: Health Behavior/Discharge Planning: Goal: Ability to manage health-related needs will improve Outcome: Progressing   Problem: Clinical Measurements: Goal: Ability to maintain clinical measurements within normal limits will improve Outcome: Progressing Goal: Will remain free from infection Outcome: Progressing Goal: Diagnostic test results will improve Outcome: Progressing Goal: Respiratory complications will improve Outcome: Progressing Goal: Cardiovascular complication will be avoided Outcome: Progressing   Problem: Activity: Goal: Risk for activity intolerance will decrease Outcome: Progressing   Problem: Nutrition: Goal: Adequate nutrition will be maintained Outcome: Progressing   Problem: Coping: Goal: Level of anxiety will decrease Outcome: Progressing   Problem: Elimination: Goal: Will not experience complications related to bowel motility Outcome: Progressing Goal: Will not experience complications related to urinary retention Outcome: Progressing   Problem: Pain Managment: Goal: General experience of comfort will improve and/or be controlled Outcome: Progressing   Problem: Safety: Goal: Ability to remain free from injury will improve Outcome: Progressing   Problem: Skin Integrity: Goal: Risk for impaired skin integrity will decrease Outcome: Progressing   Problem: Activity: Goal: Ability to tolerate increased activity will improve Outcome: Progressing   Problem: Clinical Measurements: Goal: Ability to maintain a body temperature in the normal range will improve Outcome: Progressing   Problem: Respiratory: Goal: Ability to maintain adequate ventilation will improve Outcome: Progressing Goal: Ability to maintain a clear airway  will improve Outcome: Progressing   Problem: Education: Goal: Knowledge of risk factors and measures for prevention of condition will improve Outcome: Progressing   Problem: Coping: Goal: Psychosocial and spiritual needs will be supported Outcome: Progressing   Problem: Respiratory: Goal: Will maintain a patent airway Outcome: Progressing Goal: Complications related to the disease process, condition or treatment will be avoided or minimized Outcome: Progressing

## 2024-03-20 NOTE — Progress Notes (Signed)
 PT Cancellation Note  Patient Details Name: Connor Morris MRN: [id number] DOB: 04-20-1934   Cancelled Treatment:    Reason Eval/Treat Not Completed: Patient declined, no reason specified.    Vinie GAILS [name] 03/20/2024, 6:26 PM

## 2024-03-21 ENCOUNTER — Inpatient Hospital Stay (HOSPITAL_COMMUNITY)

## 2024-03-21 DIAGNOSIS — R55 Syncope and collapse: Secondary | ICD-10-CM

## 2024-03-21 DIAGNOSIS — J690 Pneumonitis due to inhalation of food and vomit: Secondary | ICD-10-CM | POA: Diagnosis not present

## 2024-03-21 LAB — ECHOCARDIOGRAM COMPLETE
AR max vel: 1.98 cm2
AV Area VTI: 2.14 cm2
AV Area mean vel: 1.94 cm2
AV Mean grad: 10.3 mmHg
AV Peak grad: 18.8 mmHg
Ao pk vel: 2.17 m/s
Area-P 1/2: 2.29 cm2
Calc EF: 61.6 %
Height: 69 in
P 1/2 time: 564 ms
S' Lateral: 2.9 cm
Single Plane A2C EF: 58.2 %
Single Plane A4C EF: 63.8 %
Weight: 2525.59 [oz_av]

## 2024-03-21 LAB — CBC WITH DIFFERENTIAL/PLATELET
Abs Granulocyte: 3.4 K/uL (ref 1.5–6.5)
Abs Immature Granulocytes: 0.02 K/uL (ref 0.00–0.07)
Basophils Absolute: 0 K/uL (ref 0.0–0.1)
Basophils Relative: 0 %
Eosinophils Absolute: 0 K/uL (ref 0.0–0.5)
Eosinophils Relative: 0 %
HCT: 37.6 % — ABNORMAL LOW (ref 39.0–52.0)
Hemoglobin: 11.8 g/dL — ABNORMAL LOW (ref 13.0–17.0)
Immature Granulocytes: 0 %
Lymphocytes Relative: 29 %
Lymphs Abs: 1.7 K/uL (ref 0.7–4.0)
MCH: 28.6 pg (ref 26.0–34.0)
MCHC: 31.4 g/dL (ref 30.0–36.0)
MCV: 91 fL (ref 80.0–100.0)
Monocytes Absolute: 0.6 K/uL (ref 0.1–1.0)
Monocytes Relative: 11 %
Neutro Abs: 3.4 K/uL (ref 1.7–7.7)
Neutrophils Relative %: 60 %
Platelets: 141 K/uL — ABNORMAL LOW (ref 150–400)
RBC: 4.13 MIL/uL — ABNORMAL LOW (ref 4.22–5.81)
RDW: 13 % (ref 11.5–15.5)
WBC: 5.7 K/uL (ref 4.0–10.5)
nRBC: 0 % (ref 0.0–0.2)

## 2024-03-21 LAB — IRON AND TIBC
Iron: 81 ug/dL (ref 45–182)
Saturation Ratios: 28 % (ref 17.9–39.5)
TIBC: 287 ug/dL (ref 250–450)
UIBC: 206 ug/dL

## 2024-03-21 LAB — COMPREHENSIVE METABOLIC PANEL WITH GFR
ALT: 20 U/L (ref 0–44)
AST: 39 U/L (ref 15–41)
Albumin: 3.2 g/dL — ABNORMAL LOW (ref 3.5–5.0)
Alkaline Phosphatase: 31 U/L — ABNORMAL LOW (ref 38–126)
Anion gap: 9 (ref 5–15)
BUN: 30 mg/dL — ABNORMAL HIGH (ref 8–23)
CO2: 20 mmol/L — ABNORMAL LOW (ref 22–32)
Calcium: 9.1 mg/dL (ref 8.9–10.3)
Chloride: 108 mmol/L (ref 98–111)
Creatinine, Ser: 1.33 mg/dL — ABNORMAL HIGH (ref 0.61–1.24)
GFR, Estimated: 51 mL/min — ABNORMAL LOW (ref >60)
Glucose, Bld: 103 mg/dL — ABNORMAL HIGH (ref 70–99)
Potassium: 4.5 mmol/L (ref 3.5–5.1)
Sodium: 137 mmol/L (ref 135–145)
Total Bilirubin: 1.4 mg/dL — ABNORMAL HIGH (ref 0.0–1.2)
Total Protein: 7.2 g/dL (ref 6.5–8.1)

## 2024-03-21 LAB — FOLATE: Folate: 33.5 ng/mL (ref >5.9)

## 2024-03-21 LAB — MAGNESIUM: Magnesium: 2.1 mg/dL (ref 1.7–2.4)

## 2024-03-21 LAB — LEGIONELLA PNEUMOPHILA SEROGP 1 UR AG: L. pneumophila Serogp 1 Ur Ag: NEGATIVE

## 2024-03-21 LAB — VITAMIN D 25 HYDROXY (VIT D DEFICIENCY, FRACTURES): Vit D, 25-Hydroxy: 100.87 ng/mL — ABNORMAL HIGH (ref 30–100)

## 2024-03-21 LAB — VITAMIN B12: Vitamin B-12: 1141 pg/mL — ABNORMAL HIGH (ref 180–914)

## 2024-03-21 LAB — PHOSPHORUS: Phosphorus: 4.5 mg/dL (ref 2.5–4.6)

## 2024-03-21 MED ORDER — SODIUM CHLORIDE 0.9 % IV SOLN: INTRAVENOUS | Status: AC

## 2024-03-21 MED ORDER — ONDANSETRON HCL 4 MG/2ML IJ SOLN: 4.0000 mg | Freq: Four times a day (QID) | INTRAMUSCULAR | Status: DC | PRN

## 2024-03-21 MED ORDER — HYDROCOD POLI-CHLORPHE POLI ER 10-8 MG/5ML PO SUER: 5.0000 mL | Freq: Two times a day (BID) | ORAL | Status: DC | PRN

## 2024-03-21 MED ORDER — GUAIFENESIN ER 600 MG PO TB12
600.0000 mg | ORAL_TABLET | Freq: Two times a day (BID) | ORAL | Status: DC
  Administered 2024-03-21 – 2024-03-23 (×5): 600 mg via ORAL
  Filled 2024-03-21 (×6): qty 1

## 2024-03-21 MED ORDER — PANTOPRAZOLE SODIUM 40 MG PO TBEC
40.0000 mg | DELAYED_RELEASE_TABLET | Freq: Every day | ORAL | Status: DC
  Administered 2024-03-21 – 2024-03-23 (×3): 40 mg via ORAL
  Filled 2024-03-21 (×3): qty 1

## 2024-03-21 NOTE — Progress Notes (Addendum)
 Speech Language Pathology Treatment: Dysphagia  Patient Details Name: Connor Morris MRN: [id number] DOB: February 06, 1934 Today's Date: 03/21/2024 Time: 1205-1225 SLP Time Calculation (min) (ACUTE ONLY): 20 min  Assessment / Plan / Recommendation Clinical Impression  Pt observed with lunch meal. RN stated he did not have s/s aspiration with breakfast but prolonged mastication. Pt was able to self feed once set up however he needed intermittent cues to attend to task. Pt noted to have throat clear prior to eating and once during meal otherwise appeared to tolerate po's. He has suspected esophageal deficits with eructation noted on previous swallow assessment and yesterday however not present during today's session. Prior evaluation he noted difficulty swallowing certain foods. If he does have esophageal impairments, throat clearing can be present. RN stated pt is currently on Protonix . He had prolonged mastication however functional in setting of missing some dentition and no significant residue. Suspicion for aspiration is low however cannot rule out at bedside although do not feel he needs instrumental assessment. He may benefit from esophageal work up if symptoms of esophageal dysphagia continue. Recommend pt should continue Dys 3 texture, thin liquids, meds whole in puree, assist with meals from cognitive standpoint, and no further follow up needed at this time. If pt is admitted in the future with pneumonia would recommend instrumental assessment at that time.    HPI HPI: Connor Morris is a 89 y.o. male with medical history significant of sinus bradycardia, hyperlipidemia, thoracic ascending aortic aneurysm, history of DVT/PE, hiatal hernia bladder and prostate cancer, emphysema, dementia, chronic normocytic anemia presents to the ED via EMS for evaluation of generalized weakness and syncope. Found to be COVID +. Per chart dtr report not eating or drinking.  Daughter does report patient having history of  aspiration pneumonia in the past but no recent episodes of vomiting reported.   Chest x-ray showing hazy airspace and interstitial opacities in the bilateral lower lungs (L >R) suspicious for pneumonia and/or aspiration. BSE 7/25 suspected esophageal deficits with belching, report of difficulty swallowing certain foods. Reg diet/thin recommended. MBS 2023 decreased mastication, no penetration aspiration, reg/thin rec'd.      SLP Plan  All goals met;Discharge SLP treatment due to (comment)          Recommendations  Diet recommendations: Dysphagia 3 (mechanical soft);Thin liquid Liquids provided via: Cup;Straw Medication Administration: Whole meds with puree Supervision: Patient able to self feed (may need set up assist and cues for cognition) Compensations: Minimize environmental distractions;Slow rate;Small sips/bites Postural Changes and/or Swallow Maneuvers: Seated upright 90 degrees                  Oral care BID   Intermittent Supervision/Assistance Dysphagia, unspecified (R13.10)     All goals met;Discharge SLP treatment due to (comment)     Dustin Olam Bull  03/21/2024, 1:51 PM

## 2024-03-21 NOTE — Evaluation (Signed)
 Physical Therapy Evaluation Patient Details Name: Lowen Barringer MRN: [id number] DOB: May 13, 1934 Today's Date: 03/21/2024  History of Present Illness  Pt is an 89 y/o male presenting to ED due to progressive weakness and syncope with questionable seizure activity. CT chest shows potential aspiration PNA. EEG negative, COVID+. PMH: dementia, bladder and prostate cancer, hx of DVT/PE, thoracic aortic aneurysm  Clinical Impression  Pt admitted with above. Pt presents with generalized weakness, impaired standing balance and decreased activity tolerance. Pt requiring min assist for pivotal transfers from bed to [facility] and BSC to chair. Pt with multiple bowel movements during session with assist provided for posterior peri care in static standing position. Per chart review, pt family desire to take pt home with 24/7 assist. Recommend HHPT follow up.        If plan is discharge home, recommend the following: A little help with walking and/or transfers;A lot of help with bathing/dressing/bathroom;Assistance with cooking/housework;Assist for transportation;Help with stairs or ramp for entrance;Supervision due to cognitive status   Can travel by private vehicle        Equipment Recommendations Wheelchair (measurements PT);Wheelchair cushion (measurements PT)  Recommendations for Other Services       Functional Status Assessment Patient has had a recent decline in their functional status and demonstrates the ability to make significant improvements in function in a reasonable and predictable amount of time.     Precautions / Restrictions Precautions Precautions: Fall Restrictions Weight Bearing Restrictions Per Provider Order: No      Mobility  Bed Mobility Overal bed mobility: Needs Assistance Bed Mobility: Supine to Sit     Supine to sit: Contact guard     General bed mobility comments: Exiting towards right side of bed with increased time, no physical assist    Transfers Overall  transfer level: Needs assistance Equipment used: Rolling walker (2 wheels) Transfers: Sit to/from Stand, Bed to chair/wheelchair/BSC Sit to Stand: Min assist Stand pivot transfers: Min assist         General transfer comment: MinA to boost up from edge of bed and pivot to [facility] and then to chair. Steadying assist due to tremulousness    Ambulation/Gait                  Stairs            Wheelchair Mobility     Tilt Bed    Modified Rankin (Stroke Patients Only)       Balance Overall balance assessment: Needs assistance Sitting-balance support: No upper extremity supported, Feet supported Sitting balance-Leahy Scale: Fair     Standing balance support: Single extremity supported, During functional activity Standing balance-Leahy Scale: Poor                               Pertinent Vitals/Pain Pain Assessment Pain Assessment: Faces Faces Pain Scale: No hurt    Home Living Family/patient expects to be discharged to:: Private residence Living Arrangements: Children Available Help at Discharge: Family Type of Home: House         Home Layout: Two level;Able to live on main level with bedroom/bathroom Home Equipment: Rolling Walker (2 wheels);Cane - single point;BSC/3in1 Additional Comments: unclear if pt's family available or if they work    Prior Function Prior Level of Function : Needs assist;Patient poor historian/Family not available             Mobility Comments: pt reports sometimes using a RW or  cane if needed ADLs Comments: Pt reports Independence with ADLs, assist with IADLs     Extremity/Trunk Assessment   Upper Extremity Assessment Upper Extremity Assessment: Generalized weakness    Lower Extremity Assessment Lower Extremity Assessment: Generalized weakness    Cervical / Trunk Assessment Cervical / Trunk Assessment: Kyphotic  Communication   Communication Communication: Impaired Factors Affecting Communication:  Reduced clarity of speech    Cognition Arousal: Alert Behavior During Therapy: WFL for tasks assessed/performed   PT - Cognitive impairments: History of cognitive impairments                         Following commands: Impaired Following commands impaired: Only follows one step commands consistently, Follows one step commands with increased time     Cueing Cueing Techniques: Verbal cues, Gestural cues     General Comments      Exercises     Assessment/Plan    PT Assessment Patient needs continued PT services  PT Problem List Decreased strength;Decreased activity tolerance;Decreased balance;Decreased mobility;Decreased cognition       PT Treatment Interventions DME instruction;Gait training;Functional mobility training;Therapeutic activities;Therapeutic exercise;Balance training;Patient/family education    PT Goals (Current goals can be found in the Care Plan section)  Acute Rehab PT Goals Patient Stated Goal: pt family want him to go home PT Goal Formulation: With patient Time For Goal Achievement: 04/04/24 Potential to Achieve Goals: Fair    Frequency Min 2X/week     Co-evaluation               AM-PAC PT 6 Clicks Mobility  Outcome Measure Help needed turning from your back to your side while in a flat bed without using bedrails?: A Little Help needed moving from lying on your back to sitting on the side of a flat bed without using bedrails?: A Little Help needed moving to and from a bed to a chair (including a wheelchair)?: A Little Help needed standing up from a chair using your arms (e.g., wheelchair or bedside chair)?: A Little Help needed to walk in hospital room?: A Lot Help needed climbing 3-5 steps with a railing? : Total 6 Click Score: 15    End of Session Equipment Utilized During Treatment: Gait belt;Oxygen Activity Tolerance: Patient tolerated treatment well Patient left: in chair;with call bell/phone within reach;with chair alarm  set Nurse Communication: Mobility status PT Visit Diagnosis: Unsteadiness on feet (R26.81);Muscle weakness (generalized) (M62.81);Difficulty in walking, not elsewhere classified (R26.2)    Time: 0950-1030 PT Time Calculation (min) (ACUTE ONLY): 40 min   Charges:   PT Evaluation $PT Eval Moderate Complexity: 1 Mod PT Treatments $Therapeutic Activity: 23-37 mins PT General Charges $$ ACUTE PT VISIT: 1 Visit         Aleck Daring, PT, DPT Acute Rehabilitation Services Office [phone]   Alayne ONEIDA Daring 03/21/2024, 12:29 PM

## 2024-03-21 NOTE — Plan of Care (Signed)
  Problem: Education: Goal: Knowledge of General Education information will improve Description: Including pain rating scale, medication(s)/side effects and non-pharmacologic comfort measures Outcome: Progressing   Problem: Health Behavior/Discharge Planning: Goal: Ability to manage health-related needs will improve Outcome: Progressing   Problem: Clinical Measurements: Goal: Ability to maintain clinical measurements within normal limits will improve Outcome: Progressing Goal: Will remain free from infection Outcome: Progressing Goal: Diagnostic test results will improve Outcome: Progressing Goal: Respiratory complications will improve Outcome: Progressing Goal: Cardiovascular complication will be avoided Outcome: Progressing   Problem: Activity: Goal: Risk for activity intolerance will decrease Outcome: Progressing   Problem: Nutrition: Goal: Adequate nutrition will be maintained Outcome: Progressing   Problem: Coping: Goal: Level of anxiety will decrease Outcome: Progressing   Problem: Elimination: Goal: Will not experience complications related to bowel motility Outcome: Progressing Goal: Will not experience complications related to urinary retention Outcome: Progressing   Problem: Pain Managment: Goal: General experience of comfort will improve and/or be controlled Outcome: Progressing   Problem: Safety: Goal: Ability to remain free from injury will improve Outcome: Progressing   Problem: Skin Integrity: Goal: Risk for impaired skin integrity will decrease Outcome: Progressing   Problem: Activity: Goal: Ability to tolerate increased activity will improve Outcome: Progressing   Problem: Clinical Measurements: Goal: Ability to maintain a body temperature in the normal range will improve Outcome: Progressing   Problem: Respiratory: Goal: Ability to maintain adequate ventilation will improve Outcome: Progressing Goal: Ability to maintain a clear airway  will improve Outcome: Progressing   Problem: Education: Goal: Knowledge of risk factors and measures for prevention of condition will improve Outcome: Progressing   Problem: Coping: Goal: Psychosocial and spiritual needs will be supported Outcome: Progressing   Problem: Respiratory: Goal: Will maintain a patent airway Outcome: Progressing Goal: Complications related to the disease process, condition or treatment will be avoided or minimized Outcome: Progressing

## 2024-03-21 NOTE — Progress Notes (Signed)
 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 Clearing his throat and coughing and has a runny nose. Also reported 1 episode of shaking where he had to be sat down and then he became transiently unresponsive. In the emergency room hemodynamically stable. On 2 L oxygen. Chest x-ray with hazy airspace and interstitial opacities in bilateral lower lobes left more than right suspicious for pneumonia or aspiration. COVID test was positive. Patient started on remdesivir  after consented from his daughter.   Assessment and Plan:  COVID-19 viral pneumonia Acute hypoxic respiratory failure due to COVID-19 viral infection CXR: Hazy airspace and interstitial opacities in the bilateral lower lungs, suspicious for pneumonia and/or aspiration. Continue supplemental O2 and elation and gradually wean off Continue remdesivir  and steroids Continue Unasyn  for suspected aspiration WBC within normal range and procalcitonin negative, less likely bacterial infection.  Will de-escalate antibiotics after 24 hours SLP eval done, did not notice any signs of aspiration, it may be a silent aspirate but we will just continue to monitor for now Patient Lasix 20 mg p.o. twice daily Tussionex as needed for cough    Syncope: Likely secondary to above.  Continue to monitor on telemetry Bradycardia during sleep, heart rate improved CT head negative for any acute findings EEG without evidence of seizure.  Monitor. EKG: bradycardia, first-degree AV block, LAF block Follow-up 2D  echocardiogram Continue fall precautions     AKI: Most likely due to dehydration Continue IV fluid for hydration sCr 1.42>> 1.33 gradually improving   History of DVT and PE: Continue Xarelto  home dose  Head CT negative for trauma   Generalized weakness could be secondary to COVID viral infection and hypoxemia B12 and vitamin D  level within normal range  Anemia, monitor H&H Iron profile within normal range  Dementia: AO x 2  Body mass index is 23.31 kg/m.  Interventions:  Diet: Dysphagia 3 diet DVT Prophylaxis: Xarelto   Advance goals of care discussion: DNR-limited  Family Communication: family was not present at bedside, at the time of interview.  The pt provided permission to discuss medical plan with the family. Opportunity was given to ask question and all questions were answered satisfactorily.   Disposition:  Pt is from Home, admitted with acute hypoxic respiratory failure, COVID-19 viral pneumonia, still has hypoxia and on remdesivir , which precludes a safe discharge. Discharge to home, when stable, may need few days to improve.  Subjective: No significant events overnight, patient was sitting comfortably on the recliner, eating lunch.  He still has mild cough with phlegm production and mild shortness of breath.  Denied any other complaints.  Patient has dementia and AO x 2, frustration noticed while asking orientation questions.  Physical Exam: General: NAD, lying comfortably Appear in no distress, affect appropriate Eyes: PERRLA ENT: Oral Mucosa Clear, moist  Neck: no JVD,  Cardiovascular: S1 and S2 Present, no Murmur,  Respiratory: good respiratory effort, Bilateral Air entry equal and Decreased, mild Crackles, no wheezes Abdomen: Bowel Sound present, Soft and no tenderness,  Skin: no rashes Extremities: no  Pedal edema, no calf tenderness Neurologic: without any new focal findings Gait not checked due to patient safety concerns  Vitals:   03/20/24 2148  03/21/24 0000 03/21/24 0729 03/21/24 1200  BP: (!) 178/65 (!) 158/53 (!) 163/65 (!) 157/95  Pulse: (!) 56 (!) 56 (!) 51 73  Resp: 18  18   Temp: (!) 97.5 F (36.4 C) 97.6 F (36.4 C) (!) 97.5 F (36.4 C) 97.7 F (36.5 C)  TempSrc:  Oral Oral Oral  SpO2: 100% 100% 100%   Weight:      Height:        Intake/Output Summary (Last 24 hours) at 03/21/2024 1256 Last data filed at 03/21/2024 0600 Gross per 24 hour  Intake 460 ml  Output --  Net 460 ml   Filed Weights   03/19/24 2252  Weight: 71.6 kg    Data Reviewed: I have personally reviewed and interpreted daily labs, tele strips, imagings as discussed above. I reviewed all nursing notes, pharmacy notes, vitals, pertinent old records I have discussed plan of care as described above with RN and patient/family.  CBC: Recent Labs  Lab 03/19/24 1804 03/21/24 0404  WBC 6.3 5.7  NEUTROABS 4.3 3.4  HGB 12.6* 11.8*  HCT 40.3 37.6*  MCV 89.4 91.0  PLT 153 141*   Basic Metabolic Panel: Recent Labs  Lab 03/19/24 1804 03/20/24 0356 03/21/24 0404  NA 133* 135 137  K 4.4 4.2 4.5  CL 100 103 108  CO2 22 24 20*  GLUCOSE 113* 95 103*  BUN 31* 29* 30*  CREATININE 1.55* 1.42* 1.33*  CALCIUM 9.4 9.1 9.1  MG 1.8  --  2.1  PHOS  --   --  4.5    Studies: No results found.  Scheduled Meds:  Chlorhexidine  Gluconate Cloth  6 each Topical Daily   dexamethasone  (DECADRON ) injection  6 mg Intravenous Q24H   latanoprost   1 drop Both Eyes QHS   multivitamin with minerals  1 tablet Oral Daily   pantoprazole   40 mg Oral Daily   Rivaroxaban   20 mg Oral q AM   Continuous Infusions:  ampicillin -sulbactam (UNASYN ) IV 3 g (03/21/24 1034)   remdesivir  100 mg in sodium chloride  0.9 % 100 mL IVPB 100 mg (03/21/24 1120)   PRN Meds: acetaminophen  **OR** acetaminophen   Time spent: 35 minutes  Author: ELVAN SOR. MD [facility] 03/21/2024 12:56 PM  To reach On-call, see care teams to locate the attending and reach out to them via  [email or web address]. If 7PM-7AM, please contact night-coverage If you still have difficulty reaching the attending provider, please page the [facility] (Director on Call) for [facility] on amion for assistance.

## 2024-03-22 DIAGNOSIS — J690 Pneumonitis due to inhalation of food and vomit: Secondary | ICD-10-CM | POA: Diagnosis not present

## 2024-03-22 LAB — CBC
HCT: 32.2 % — ABNORMAL LOW (ref 39.0–52.0)
Hemoglobin: 10.2 g/dL — ABNORMAL LOW (ref 13.0–17.0)
MCH: 28.3 pg (ref 26.0–34.0)
MCHC: 31.7 g/dL (ref 30.0–36.0)
MCV: 89.2 fL (ref 80.0–100.0)
Platelets: 173 K/uL (ref 150–400)
RBC: 3.61 MIL/uL — ABNORMAL LOW (ref 4.22–5.81)
RDW: 13 % (ref 11.5–15.5)
WBC: 8.5 K/uL (ref 4.0–10.5)
nRBC: 0 % (ref 0.0–0.2)

## 2024-03-22 LAB — BASIC METABOLIC PANEL WITH GFR
Anion gap: 7 (ref 5–15)
BUN: 35 mg/dL — ABNORMAL HIGH (ref 8–23)
CO2: 21 mmol/L — ABNORMAL LOW (ref 22–32)
Calcium: 8.7 mg/dL — ABNORMAL LOW (ref 8.9–10.3)
Chloride: 109 mmol/L (ref 98–111)
Creatinine, Ser: 1.26 mg/dL — ABNORMAL HIGH (ref 0.61–1.24)
GFR, Estimated: 55 mL/min — ABNORMAL LOW (ref >60)
Glucose, Bld: 100 mg/dL — ABNORMAL HIGH (ref 70–99)
Potassium: 4.4 mmol/L (ref 3.5–5.1)
Sodium: 137 mmol/L (ref 135–145)

## 2024-03-22 LAB — PHOSPHORUS: Phosphorus: 2.3 mg/dL — ABNORMAL LOW (ref 2.5–4.6)

## 2024-03-22 LAB — MAGNESIUM: Magnesium: 1.9 mg/dL (ref 1.7–2.4)

## 2024-03-22 MED ORDER — SODIUM CHLORIDE 0.9% FLUSH: 10.0000 mL | INTRAVENOUS | Status: DC | PRN

## 2024-03-22 MED ORDER — RIVAROXABAN 10 MG PO TABS
20.0000 mg | ORAL_TABLET | Freq: Every day | ORAL | Status: DC
  Administered 2024-03-23: 20 mg via ORAL
  Filled 2024-03-22: qty 2

## 2024-03-22 MED ORDER — K PHOS MONO-SOD PHOS DI & MONO 155-852-130 MG PO TABS
500.0000 mg | ORAL_TABLET | Freq: Four times a day (QID) | ORAL | Status: AC
  Administered 2024-03-22 (×2): 500 mg via ORAL
  Filled 2024-03-22 (×2): qty 2

## 2024-03-22 NOTE — Progress Notes (Signed)
 Occupational Therapy Treatment Patient Details Name: Connor Morris MRN: [id number] DOB: 1934/05/15 Today's Date: 03/22/2024   History of present illness Pt is an 89 y/o male presenting to ED due to progressive weakness and syncope with questionable seizure activity. CT chest shows potential aspiration PNA. EEG negative, COVID+. PMH: dementia, bladder and prostate cancer, hx of DVT/PE, thoracic aortic aneurysm   OT comments  Pt with incremental progress towards OT goals w/ transfer improvements from initial eval with use of RW. Pt remains limited by bowel incontinence though unclear if this is an acute issue, requiring Max A for cleanup. Noted family declining postacute rehab stay and opting for HHOT. Would recommend BSC for home use d/t frequent BM and HHOT follow up.       If plan is discharge home, recommend the following:  A lot of help with bathing/dressing/bathroom;A little help with walking and/or transfers   Equipment Recommendations  BSC/3in1    Recommendations for Other Services      Precautions / Restrictions Precautions Precautions: Fall Recall of Precautions/Restrictions: Impaired Restrictions Weight Bearing Restrictions Per Provider Order: No       Mobility Bed Mobility Overal bed mobility: Needs Assistance Bed Mobility: Rolling, Sit to Supine, Supine to Sit Rolling: Supervision   Supine to sit: Mod assist, HOB elevated, Used rails Sit to supine: Min assist   General bed mobility comments: assist to lift trunk, scoot hips to EOB. Light Mod A    Transfers Overall transfer level: Needs assistance Equipment used: Rolling walker (2 wheels) Transfers: Sit to/from Stand Sit to Stand: Min assist           General transfer comment: Min A to stand from bedside     Balance Overall balance assessment: Needs assistance Sitting-balance support: No upper extremity supported, Feet supported Sitting balance-Leahy Scale: Fair     Standing balance support: Single  extremity supported, During functional activity Standing balance-Leahy Scale: Poor                             ADL either performed or assessed with clinical judgement   ADL Overall ADL's : Needs assistance/impaired     Grooming: Set up;Sitting;Wash/dry face   Upper Body Bathing: Minimal assistance;Sitting Upper Body Bathing Details (indicate cue type and reason): assistance to bathe back     Upper Body Dressing : Minimal assistance;Sitting           Toileting- Clothing Manipulation and Hygiene: Sit to/from stand;Maximal assistance Toileting - Clothing Manipulation Details (indicate cue type and reason): assisted with anterior/posterior hygiene after bowel incontinence noted in bed w/ pt seemingly unaware, pt assisting to hold gown out of way but requiring assist for balance            Extremity/Trunk Assessment Upper Extremity Assessment Upper Extremity Assessment: Generalized weakness;Right hand dominant   Lower Extremity Assessment Lower Extremity Assessment: Defer to PT evaluation        Vision   Vision Assessment?: No apparent visual deficits   Perception     Praxis     Communication Communication Communication: Impaired Factors Affecting Communication: Reduced clarity of speech   Cognition Arousal: Alert Behavior During Therapy: WFL for tasks assessed/performed Cognition: No family/caregiver present to determine baseline             OT - Cognition Comments: hx of dementia, pleasant, able to follow commands, cues needed for sequencing and pt with some decreased insight into deficits. memory deficits. speech garbled  and difficult to understand                 Following commands: Impaired Following commands impaired: Only follows one step commands consistently, Follows one step commands with increased time      Cueing   Cueing Techniques: Verbal cues, Gestural cues  Exercises      Shoulder Instructions       General  Comments RN notified (who then notified NT) about soiled sheets    Pertinent Vitals/ Pain       Pain Assessment Pain Assessment: No/denies pain  Home Living                                          Prior Functioning/Environment              Frequency  Min 2X/week        Progress Toward Goals  OT Goals(current goals can now be found in the care plan section)  Progress towards OT goals: Progressing toward goals  Acute Rehab OT Goals Patient Stated Goal: none stated today, hopeful for breakfast soon OT Goal Formulation: With patient Time For Goal Achievement: 04/03/24 Potential to Achieve Goals: Good ADL Goals Pt Will Perform Grooming: with supervision;standing Pt Will Perform Lower Body Bathing: with min assist;sit to/from stand;sitting/lateral leans Pt Will Perform Lower Body Dressing: with min assist;sit to/from stand;sitting/lateral leans Pt Will Transfer to Toilet: with contact guard assist;ambulating  Plan      Co-evaluation                 AM-PAC OT 6 Clicks Daily Activity     Outcome Measure   Help from another person eating meals?: A Little Help from another person taking care of personal grooming?: A Little Help from another person toileting, which includes using toliet, bedpan, or urinal?: Total Help from another person bathing (including washing, rinsing, drying)?: A Lot Help from another person to put on and taking off regular upper body clothing?: A Little Help from another person to put on and taking off regular lower body clothing?: A Lot 6 Click Score: 14    End of Session Equipment Utilized During Treatment: Rolling walker (2 wheels)  OT Visit Diagnosis: Other abnormalities of gait and mobility (R26.89);Unsteadiness on feet (R26.81);Muscle weakness (generalized) (M62.81);Other symptoms and signs involving cognitive function   Activity Tolerance Patient tolerated treatment well;Patient limited by fatigue   Patient  Left in bed;with call bell/phone within reach;with bed alarm set   Nurse Communication Mobility status        Time: 9281-9255 OT Time Calculation (min): 26 min  Charges: OT General Charges $OT Visit: 1 Visit OT Treatments $Self Care/Home Management : 23-37 mins  Mliss NOVAK, OTR/L Acute Rehab Services Office: [phone]   Mliss Fish 03/22/2024, 7:55 AM

## 2024-03-22 NOTE — Progress Notes (Signed)
 Assumed care at 0700, patient lying in bed with eyes closed, even unlabored breathing, skin warm and dry to the touch, pt up OOB to chair, ambulated in room 2x, pulse rate 52 with no other symptoms, call light within reach, safety measures are in place.

## 2024-03-22 NOTE — TOC Progression Note (Signed)
 Transition of Care [facility]) - Progression Note    Patient Details  Name: Connor Morris MRN: [id number] Date of Birth: 18-Dec-1933  [facility] [place] CM/SW Contact  Rosaline JONELLE Joe, RN Phone Number: 03/22/2024, 3:55 PM  Clinical Narrative:    CM spoke with attending MD, Dr. Von and patient remains on RA and will likely discharge home tomorrow.  I called and spoke with the patient's daughter by phone and she is aware of pending discharge tomorrow.  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  Daughter was advised to call PCP and schedule hospital follow up in the next 7-10 days.  Patient has all DME at home.  Daughter is aware that patient is set up with [facility] for PT/OT.                     Expected Discharge Plan and Services                                               Social Drivers of Health (SDOH) Interventions SDOH Screenings   Food Insecurity: No Food Insecurity (03/19/2024)  Housing: Low Risk  (03/19/2024)  Transportation Needs: No Transportation Needs (03/19/2024)  Utilities: Not At Risk (03/19/2024)  Social Connections: Socially Isolated (03/19/2024)  Tobacco Use: Medium Risk (03/19/2024)    Readmission Risk Interventions    03/20/2024    3:34 PM 03/10/2022    9:39 AM  Readmission Risk Prevention Plan  Post Dischage Appt  Complete  Medication Screening  Complete  Transportation Screening Complete Complete  PCP or Specialist Appt within 5-7 Days Complete   Home Care Screening Complete   Medication Review (RN CM) Complete

## 2024-03-22 NOTE — Plan of Care (Addendum)
  Problem: Elimination: Goal: Will not experience complications related to urinary retention Outcome: Progressing   Problem: Safety: Goal: Ability to remain free from injury will improve Outcome: Progressing Note: At this time, blue mitts were placed on patient's hands to prevent lines from being pulled. Patient is fairly confused with constant need to redirect care.    Problem: Skin Integrity: Goal: Risk for impaired skin integrity will decrease Outcome: Progressing

## 2024-03-22 NOTE — Progress Notes (Signed)

## 2024-03-22 NOTE — Progress Notes (Signed)
 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 Clearing his throat and coughing and has a runny nose. Also reported 1 episode of shaking where he had to be sat down and then he became transiently unresponsive. In the emergency room hemodynamically stable. On 2 L oxygen. Chest x-ray with hazy airspace and interstitial opacities in bilateral lower lobes left more than right suspicious for pneumonia or aspiration. COVID test was positive. Patient started on remdesivir  after consented from his daughter.   Assessment and Plan:  COVID-19 viral pneumonia Acute hypoxic respiratory failure due to COVID-19 viral infection CXR: Hazy airspace and interstitial opacities in the bilateral lower lungs, suspicious for pneumonia and/or aspiration. Continue supplemental O2 and elation and gradually wean off Continue remdesivir  and steroids Continue Unasyn  for suspected aspiration WBC within normal range and procalcitonin negative, less likely bacterial infection.  Will de-escalate antibiotics after 24 hours SLP eval done, did not notice any signs of aspiration, it may be a silent aspirate but we will just continue to monitor for now Patient Lasix 20 mg p.o. twice daily Tussionex as needed for cough    Syncope: Likely secondary to above.  Continue to monitor on telemetry Bradycardia during sleep, heart rate improved CT head negative for any acute findings EEG without evidence of seizure.  Monitor. EKG: bradycardia, first-degree AV block, LAF block TTE LVEF 65 to 70%,  grade 1 diastolic dysfunction.  Ascending aortic aneurysm.  No any other significant findings. Continue fall precautions     AKI: Most likely due to dehydration Continue IV fluid for hydration sCr 1.42>> 1.33>>1.26 gradually improving   History of DVT and PE: Continue Xarelto  home dose  Head CT negative for trauma   Ascending aortic aneurysm TTE shows ascending aortic dilatation 46 mm Patient was recommended to follow-up with PCP and vascular surgery as an outpatient.  Generalized weakness could be secondary to COVID viral infection and hypoxemia B12 and vitamin D  level above normal range  Anemia, monitor H&H Iron profile within normal range  Dementia: AO x 2  Hypophosphatemia, Phos repleted. Monitor electrolytes daily.   Body mass index is 23.31 kg/m.  Interventions:  Diet: Dysphagia 3 diet DVT Prophylaxis: Xarelto   Advance goals of care discussion: DNR-limited  Family Communication: family was not present at bedside, at the time of interview.  The pt provided permission to discuss medical plan with the family. Opportunity was given to ask question and all questions were answered satisfactorily.   Disposition:  Pt is from Home, admitted with acute hypoxic respiratory failure, COVID-19 viral pneumonia, still has hypoxia and on remdesivir , which precludes a safe discharge. Discharge to home, when stable, may need few days to improve.  Subjective: No significant events overnight, patient breathing is getting better, still has mild productive cough.  Denied any chest pain.   Physical Exam: General: NAD, lying comfortably Appear in no distress, affect appropriate Eyes: PERRLA ENT: Oral Mucosa Clear, moist  Neck: no JVD,  Cardiovascular: S1 and S2 Present, no Murmur,  Respiratory: Equal air entry bilaterally, mild  crackles, no wheezes.  Abdomen: Bowel Sound present, Soft and no tenderness,  Skin: no rashes Extremities: no Pedal edema, no calf  tenderness Neurologic: without any new focal findings Gait not checked due to patient safety concerns  Vitals:   03/21/24 1545 03/21/24 2050 03/22/24 0445 03/22/24 0812  BP: (!) 138/109 (!) 150/72 138/80 (!) 153/57  Pulse: 65 68 61 (!) 58  Resp: 18  17   Temp: 97.8 F (36.6 C) 99.1 F (37.3 C) (!) 97.5 F (36.4 C) 98.4 F (36.9 C)  TempSrc: Oral Oral  Oral  SpO2: 98% 99% 98% 100%  Weight:      Height:        Intake/Output Summary (Last 24 hours) at 03/22/2024 1418 Last data filed at 03/22/2024 0600 Gross per 24 hour  Intake 575.23 ml  Output 450 ml  Net 125.23 ml   Filed Weights   03/19/24 2252  Weight: 71.6 kg    Data Reviewed: I have personally reviewed and interpreted daily labs, tele strips, imagings as discussed above. I reviewed all nursing notes, pharmacy notes, vitals, pertinent old records I have discussed plan of care as described above with RN and patient/family.  CBC: Recent Labs  Lab 03/19/24 1804 03/21/24 0404 03/22/24 0349  WBC 6.3 5.7 8.5  NEUTROABS 4.3 3.4  --   HGB 12.6* 11.8* 10.2*  HCT 40.3 37.6* 32.2*  MCV 89.4 91.0 89.2  PLT 153 141* 173   Basic Metabolic Panel: Recent Labs  Lab 03/19/24 1804 03/20/24 0356 03/21/24 0404 03/22/24 0349  NA 133* 135 137 137  K 4.4 4.2 4.5 4.4  CL 100 103 108 109  CO2 22 24 20* 21*  GLUCOSE 113* 95 103* 100*  BUN 31* 29* 30* 35*  CREATININE 1.55* 1.42* 1.33* 1.26*  CALCIUM 9.4 9.1 9.1 8.7*  MG 1.8  --  2.1 1.9  PHOS  --   --  4.5 2.3*    Studies: ECHOCARDIOGRAM COMPLETE Result Date: 03/21/2024    ECHOCARDIOGRAM REPORT   Patient Name:   TAKAHIRO GODINHO Date of Exam: 03/21/2024 Medical Rec #:  [id number]      Height:       69.0 in Accession #:    [id number]     Weight:       157.8 lb Date of Birth:  28-Jul-1934      BSA:          1.868 m Patient Age:    89 years       BP:           163/65 mmHg Patient Gender: M              HR:           101 bpm. Exam Location:  Inpatient Procedure: 2D Echo, Cardiac  Doppler, Color Doppler and Intracardiac            Opacification Agent (Both Spectral and Color Flow Doppler were            utilized during procedure). Indications:    Syncope  History:        Patient has prior history of Echocardiogram examinations, most                 recent 04/06/2018. Signs/Symptoms:Syncope; Risk                 Factors:Dyslipidemia.  Sonographer:    Therisa Crouch Referring Phys: [id number] VASUNDHRA RATHORE IMPRESSIONS  1. Left ventricular ejection fraction, by estimation, is  65 to 70%. The left ventricle has normal function. The left ventricle has no regional wall motion abnormalities. There is mild left ventricular hypertrophy. Left ventricular diastolic parameters are consistent with Grade I diastolic dysfunction (impaired relaxation).  2. Right ventricular systolic function is normal. The right ventricular size is normal. Tricuspid regurgitation signal is inadequate for assessing PA pressure.  3. The mitral valve is grossly normal. Trivial mitral valve regurgitation. No evidence of mitral stenosis.  4. The aortic valve is abnormal. There is moderate calcification of the aortic valve. Aortic valve regurgitation is mild. Mild aortic valve stenosis. Aortic valve area, by VTI measures 2.14 cm. Aortic valve mean gradient measures 10.3 mmHg. Aortic valve Vmax measures 2.17 m/s.  5. Aortic dilatation noted. Aneurysm of the ascending aorta, measuring 46 mm. There is moderate dilatation of the ascending aorta.  6. The inferior vena cava is normal in size with greater than 50% respiratory variability, suggesting right atrial pressure of 3 mmHg. FINDINGS  Left Ventricle: Left ventricular ejection fraction, by estimation, is 65 to 70%. The left ventricle has normal function. The left ventricle has no regional wall motion abnormalities. The left ventricular internal cavity size was normal in size. There is  mild left ventricular hypertrophy. Left ventricular diastolic parameters are consistent with Grade  I diastolic dysfunction (impaired relaxation). Right Ventricle: The right ventricular size is normal. No increase in right ventricular wall thickness. Right ventricular systolic function is normal. Tricuspid regurgitation signal is inadequate for assessing PA pressure. Left Atrium: Left atrial size was normal in size. Right Atrium: Right atrial size was normal in size. Pericardium: There is no evidence of pericardial effusion. Mitral Valve: The mitral valve is grossly normal. Trivial mitral valve regurgitation. No evidence of mitral valve stenosis. Tricuspid Valve: The tricuspid valve is normal in structure. Tricuspid valve regurgitation is not demonstrated. No evidence of tricuspid stenosis. Aortic Valve: The aortic valve is abnormal. There is moderate calcification of the aortic valve. Aortic valve regurgitation is mild. Aortic regurgitation PHT measures 564 msec. Mild aortic stenosis is present. Aortic valve mean gradient measures 10.3 mmHg. Aortic valve peak gradient measures 18.8 mmHg. Aortic valve area, by VTI measures 2.14 cm. Pulmonic Valve: The pulmonic valve was normal in structure. Pulmonic valve regurgitation is not visualized. No evidence of pulmonic stenosis. Aorta: Aortic dilatation noted. There is moderate dilatation of the ascending aorta. There is an aneurysm involving the ascending aorta measuring 46 mm. Venous: The inferior vena cava is normal in size with greater than 50% respiratory variability, suggesting right atrial pressure of 3 mmHg. IAS/Shunts: No atrial level shunt detected by color flow Doppler.  LEFT VENTRICLE PLAX 2D LVIDd:         4.30 cm     Diastology LVIDs:         2.90 cm     LV e' medial:    5.75 cm/s LV PW:         1.20 cm     LV E/e' medial:  13.3 LV IVS:        1.00 cm     LV e' lateral:   7.77 cm/s LVOT diam:     2.30 cm     LV E/e' lateral: 9.8 LV SV:         106 LV SV Index:   57 LVOT Area:     4.15 cm  LV Volumes (MOD) LV vol d, MOD A2C: 60.8 ml LV vol d, MOD A4C: 69.0  ml LV vol s, MOD A2C:  25.4 ml LV vol s, MOD A4C: 25.0 ml LV SV MOD A2C:     35.4 ml LV SV MOD A4C:     69.0 ml LV SV MOD BP:      41.8 ml RIGHT VENTRICLE          IVC RV Basal diam:  3.40 cm  IVC diam: 1.30 cm LEFT ATRIUM             Index LA diam:        3.80 cm 2.03 cm/m LA Vol (A2C):   41.1 ml 22.00 ml/m LA Vol (A4C):   87.4 ml 46.78 ml/m LA Biplane Vol: 60.4 ml 32.33 ml/m  AORTIC VALVE AV Area (Vmax):    1.98 cm AV Area (Vmean):   1.94 cm AV Area (VTI):     2.14 cm AV Vmax:           216.67 cm/s AV Vmean:          148.333 cm/s AV VTI:            0.493 m AV Peak Grad:      18.8 mmHg AV Mean Grad:      10.3 mmHg LVOT Vmax:         103.00 cm/s LVOT Vmean:        69.250 cm/s LVOT VTI:          0.254 m LVOT/AV VTI ratio: 0.52 AI PHT:            564 msec  AORTA Ao Root diam: 3.70 cm Ao Asc diam:  4.60 cm MITRAL VALVE MV Area (PHT): 2.29 cm     SHUNTS MV Decel Time: 332 msec     Systemic VTI:  0.25 m MV E velocity: 76.40 cm/s   Systemic Diam: 2.30 cm MV A velocity: 106.00 cm/s MV E/A ratio:  0.72 Soyla Merck MD Electronically signed by Soyla Merck MD Signature Date/Time: 03/21/2024/4:50:57 PM    Final     Scheduled Meds:  Chlorhexidine  Gluconate Cloth  6 each Topical Daily   dexamethasone  (DECADRON ) injection  6 mg Intravenous Q24H   guaiFENesin   600 mg Oral BID   latanoprost   1 drop Both Eyes QHS   multivitamin with minerals  1 tablet Oral Daily   pantoprazole   40 mg Oral Daily   phosphorus  500 mg Oral QID   [START ON 03/23/2024] Rivaroxaban   20 mg Oral Q supper   Continuous Infusions:  ampicillin -sulbactam (UNASYN ) IV 3 g (03/22/24 0826)   PRN Meds: acetaminophen  **OR** acetaminophen , chlorpheniramine-HYDROcodone , ondansetron  (ZOFRAN ) IV, sodium chloride  flush  Time spent: 35 minutes  Author: ELVAN SOR. MD [facility] 03/22/2024 2:18 PM  To reach On-call, see care teams to locate the attending and reach out to them via [email or web address]. If 7PM-7AM, please contact  night-coverage If you still have difficulty reaching the attending provider, please page the [facility] (Director on Call) for [facility] on amion for assistance.

## 2024-03-22 NOTE — Progress Notes (Signed)
 Mobility Specialist: Progress Note   03/22/24 1600  Mobility  Activity Ambulated with assistance  Level of Assistance Minimal assist, patient does 75% or more  Assistive Device Front wheel walker  Distance Ambulated (ft) 10 ft  Activity Response Tolerated well  Mobility Referral Yes  Mobility visit 1 Mobility  Mobility Specialist Start Time (ACUTE ONLY) 1112  Mobility Specialist Stop Time (ACUTE ONLY) 1142  Mobility Specialist Time Calculation (min) (ACUTE ONLY) 30 min    Pt received in bed, agreeable to mobility session. SV for bed mobility. Light modA for STS. Required pericare while static standing at bedside with CGA d/t previous BM in bed. Agreed to try ambulation. Ambulated about 5' forward but became incontinent of bowels during ambulation and returned to bedside. TotA for pericare and linen change while pt stood at bedside with CGA. MinA for sit>supine to assist BLEs. Left in bed with all needs met, call bell in reach.   Ileana Lute Mobility Specialist Please contact via SecureChat or Rehab office at [phone]

## 2024-03-23 ENCOUNTER — Inpatient Hospital Stay (HOSPITAL_COMMUNITY)

## 2024-03-23 DIAGNOSIS — J690 Pneumonitis due to inhalation of food and vomit: Secondary | ICD-10-CM | POA: Diagnosis not present

## 2024-03-23 LAB — CBC
HCT: 30.7 % — ABNORMAL LOW (ref 39.0–52.0)
Hemoglobin: 9.9 g/dL — ABNORMAL LOW (ref 13.0–17.0)
MCH: 28.6 pg (ref 26.0–34.0)
MCHC: 32.2 g/dL (ref 30.0–36.0)
MCV: 88.7 fL (ref 80.0–100.0)
Platelets: 226 K/uL (ref 150–400)
RBC: 3.46 MIL/uL — ABNORMAL LOW (ref 4.22–5.81)
RDW: 13.1 % (ref 11.5–15.5)
WBC: 9 K/uL (ref 4.0–10.5)
nRBC: 0 % (ref 0.0–0.2)

## 2024-03-23 LAB — BASIC METABOLIC PANEL WITH GFR
Anion gap: 12 (ref 5–15)
BUN: 32 mg/dL — ABNORMAL HIGH (ref 8–23)
CO2: 23 mmol/L (ref 22–32)
Calcium: 8.9 mg/dL (ref 8.9–10.3)
Chloride: 108 mmol/L (ref 98–111)
Creatinine, Ser: 1.4 mg/dL — ABNORMAL HIGH (ref 0.61–1.24)
GFR, Estimated: 48 mL/min — ABNORMAL LOW (ref >60)
Glucose, Bld: 99 mg/dL (ref 70–99)
Potassium: 4.1 mmol/L (ref 3.5–5.1)
Sodium: 143 mmol/L (ref 135–145)

## 2024-03-23 LAB — MAGNESIUM: Magnesium: 2.1 mg/dL (ref 1.7–2.4)

## 2024-03-23 LAB — PHOSPHORUS: Phosphorus: 2.8 mg/dL (ref 2.5–4.6)

## 2024-03-23 LAB — HEMOGLOBIN AND HEMATOCRIT, BLOOD
HCT: 36 % — ABNORMAL LOW (ref 39.0–52.0)
Hemoglobin: 11.4 g/dL — ABNORMAL LOW (ref 13.0–17.0)

## 2024-03-23 MED ORDER — PANTOPRAZOLE SODIUM 40 MG IV SOLR
40.0000 mg | Freq: Two times a day (BID) | INTRAVENOUS | Status: DC
  Administered 2024-03-23 – 2024-03-24 (×2): 40 mg via INTRAVENOUS
  Filled 2024-03-23 (×2): qty 10

## 2024-03-23 MED ORDER — AMLODIPINE BESYLATE 5 MG PO TABS
5.0000 mg | ORAL_TABLET | Freq: Every day | ORAL | Status: DC
  Administered 2024-03-23 – 2024-03-24 (×2): 5 mg via ORAL
  Filled 2024-03-23 (×2): qty 1

## 2024-03-23 MED ORDER — AMOXICILLIN-POT CLAVULANATE 875-125 MG PO TABS
1.0000 | ORAL_TABLET | Freq: Two times a day (BID) | ORAL | Status: AC
  Administered 2024-03-23 – 2024-03-24 (×3): 1 via ORAL
  Filled 2024-03-23 (×3): qty 1

## 2024-03-23 MED ORDER — HYDRALAZINE HCL 20 MG/ML IJ SOLN: 10.0000 mg | Freq: Four times a day (QID) | INTRAMUSCULAR | Status: DC | PRN

## 2024-03-23 MED ORDER — GUAIFENESIN 200 MG PO TABS
400.0000 mg | ORAL_TABLET | Freq: Three times a day (TID) | ORAL | Status: DC
  Administered 2024-03-23 – 2024-03-24 (×2): 400 mg via ORAL
  Filled 2024-03-23 (×2): qty 2

## 2024-03-23 MED ORDER — HYDRALAZINE HCL 50 MG PO TABS: 50.0000 mg | ORAL_TABLET | Freq: Four times a day (QID) | ORAL | Status: DC | PRN

## 2024-03-23 NOTE — TOC Transition Note (Addendum)
 Transition of Care [facility]) - Discharge Note   Patient Details  Name: Connor Morris MRN: [id number] Date of Birth: 06/25/1934  [facility] CM/SW Contact:  Rosaline JONELLE Joe, RN Phone Number: 03/23/2024, 3:28 PM   Clinical Narrative:    CM spoke with the attending physician and patient is stable to discharge home today.  I called and spoke with the patient's sister.  Patient is being discharged home by car with the sister between 4-430 pm today.  03/23/24 1539 - MD states that renal CT was abnormal and that discharge may be delayed.  I called and notified the sister by phone.  The sister states that patient has a WC and bedside commode at the home.  I asked that the bedside nurse call the sister back and updated regarding if patient is able to discharge or not.  03/23/24 1615 - CM spoke with MD and patient will need CT scan.  Discharge is delayed until tomorrow if patient is medically stable.  MD spoke with the daughter and updated that he will likely discharge tomorrow.  Patient has all DME at home.           Patient Goals and CMS Choice            Discharge Placement                       Discharge Plan and Services Additional resources added to the After Visit Summary for                                       Social Drivers of Health (SDOH) Interventions SDOH Screenings   Food Insecurity: No Food Insecurity (03/19/2024)  Housing: Low Risk  (03/19/2024)  Transportation Needs: No Transportation Needs (03/19/2024)  Utilities: Not At Risk (03/19/2024)  Social Connections: Socially Isolated (03/19/2024)  Tobacco Use: Medium Risk (03/19/2024)     Readmission Risk Interventions    03/20/2024    3:34 PM 03/10/2022    9:39 AM  Readmission Risk Prevention Plan  Post Dischage Appt  Complete  Medication Screening  Complete  Transportation Screening Complete Complete  PCP or Specialist Appt within 5-7 Days Complete   Home Care Screening Complete    Medication Review (RN CM) Complete

## 2024-03-23 NOTE — Progress Notes (Signed)
 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 Clearing his throat and coughing and has a runny nose. Also reported 1 episode of shaking where he had to be sat down and then he became transiently unresponsive. In the emergency room hemodynamically stable. On 2 L oxygen. Chest x-ray with hazy airspace and interstitial opacities in bilateral lower lobes left more than right suspicious for pneumonia or aspiration. COVID test was positive. Patient started on remdesivir  after consented from his daughter.   Assessment and Plan:  COVID-19 viral pneumonia Acute hypoxic respiratory failure due to COVID-19 viral infection CXR: Hazy airspace and interstitial opacities in the bilateral lower lungs, suspicious for pneumonia and/or aspiration. Continue supplemental O2 and elation and gradually wean off Continue remdesivir  and steroids S/p Unasyn  for suspected aspiration, 8/8 transition to Augmentin  twice daily to complete total 5-day course WBC within normal range and procalcitonin negative, less likely bacterial infection. de-escalated antibiotics as above SLP eval done, did not notice any signs of aspiration, it may be a silent aspirate but we will just continue to monitor for now Mucinex  600 mg p.o. twice daily Tussionex as needed for cough    Hypertension 8/8 blood pressure significantly elevated Started amlodipine  5 mg p.o. daily Monitor BP and titrate medication accordingly  Syncope: Likely secondary to above.  Continue to monitor on telemetry Bradycardia  during sleep, heart rate improved CT head negative for any acute findings EEG without evidence of seizure.  Monitor. EKG: bradycardia, first-degree AV block, LAF block TTE LVEF 65 to 70%, grade 1 diastolic dysfunction.  Ascending aortic aneurysm.  No any other significant findings. Continue fall precautions     AKI: Most likely due to dehydration Continue IV fluid for hydration sCr 1.42>> 1.33>>1.26>>1.4    History of DVT and PE: Continue Xarelto  home dose  Head CT negative for trauma   Ascending aortic aneurysm TTE shows ascending aortic dilatation 46 mm Patient was recommended to follow-up with PCP and vascular surgery as an outpatient.  Generalized weakness could be secondary to COVID viral infection and hypoxemia B12 and vitamin D  level above normal range  Anemia, monitor H&H Iron profile within normal range  Dementia: AO x 2  Hypophosphatemia, Phos repleted. Monitor electrolytes daily.  H/o urinary bladder cancer US  renal shows right hydronephrosis, recommended CT A/P to rule out distal calculi Follow CT stone 8/8 d/w patient's daughter, as per her patient is following urology, next appointment is on September 12    Body mass index is 23.31 kg/m.  Interventions:  Diet: Dysphagia 3 diet DVT Prophylaxis: Xarelto   Advance goals of care discussion: DNR-limited  Family Communication: family was not present at bedside, at the time of interview.  The pt provided permission to discuss medical plan with the family. Opportunity was given to ask question and all questions were answered satisfactorily.   Disposition:  Pt is from Home, admitted with acute hypoxic respiratory failure, COVID-19 viral pneumonia, still has hypoxia and on remdesivir , which precludes a safe discharge. Discharge to home, when stable, may need few  days to improve.  Subjective: No significant events overnight, patient has significant dementia AO x 1.  Overall feeling better, very poor cognition,  and not a very good historian. Resting comfortably.  Stated breathing is fine   Physical Exam: General: NAD, lying comfortably Appear in no distress, affect appropriate Eyes: PERRLA ENT: Oral Mucosa Clear, moist  Neck: no JVD,  Cardiovascular: S1 and S2 Present, no Murmur,  Respiratory: Equal air entry bilaterally, mild crackles, no wheezes.  Abdomen: Bowel Sound present, Soft and no tenderness,  Skin: no rashes Extremities: no Pedal edema, no calf tenderness Neurologic: without any new focal findings Gait not checked due to patient safety concerns  Vitals:   03/23/24 0010 03/23/24 0500 03/23/24 0829 03/23/24 1136  BP: (!) 170/68 (!) 135/50 (!) 195/48 (!) 143/68  Pulse: (!) 59 (!) 45 (!) 49 97  Resp:      Temp: 98 F (36.7 C) 98.6 F (37 C) 97.7 F (36.5 C) (!) 97.5 F (36.4 C)  TempSrc: Oral Oral    SpO2: 100% 99% 94% 94%  Weight:      Height:        Intake/Output Summary (Last 24 hours) at 03/23/2024 1619 Last data filed at 03/23/2024 1030 Gross per 24 hour  Intake 1057.94 ml  Output 350 ml  Net 707.94 ml   Filed Weights   03/19/24 2252  Weight: 71.6 kg    Data Reviewed: I have personally reviewed and interpreted daily labs, tele strips, imagings as discussed above. I reviewed all nursing notes, pharmacy notes, vitals, pertinent old records I have discussed plan of care as described above with RN and patient/family.  CBC: Recent Labs  Lab 03/19/24 1804 03/21/24 0404 03/22/24 0349 03/23/24 0237  WBC 6.3 5.7 8.5 9.0  NEUTROABS 4.3 3.4  --   --   HGB 12.6* 11.8* 10.2* 9.9*  HCT 40.3 37.6* 32.2* 30.7*  MCV 89.4 91.0 89.2 88.7  PLT 153 141* 173 226   Basic Metabolic Panel: Recent Labs  Lab 03/19/24 1804 03/20/24 0356 03/21/24 0404 03/22/24 0349 03/23/24 0237  NA 133* 135 137 137 143  K 4.4 4.2 4.5 4.4 4.1  CL 100 103 108 109 108  CO2 22 24 20* 21* 23  GLUCOSE 113* 95 103* 100* 99  BUN 31* 29* 30* 35* 32*  CREATININE 1.55* 1.42* 1.33* 1.26* 1.40*   CALCIUM 9.4 9.1 9.1 8.7* 8.9  MG 1.8  --  2.1 1.9 2.1  PHOS  --   --  4.5 2.3* 2.8    Studies: US  RENAL Result Date: 03/23/2024 CLINICAL DATA:  Acute kidney injury. EXAM: RENAL / URINARY TRACT ULTRASOUND COMPLETE COMPARISON:  Abdomen and pelvis CT dated 08/16/2019. PET-CT dated 01/29/2022. FINDINGS: Right Kidney: Renal measurements: 8.8 x 5.4 x 5.4 cm = volume: 132 mL. 7 mm simple cyst not requiring imaging follow-up. Mildly echogenic. Mild-to-moderate dilatation of the right renal pelvis and proximal ureter, not previously present. Left Kidney: Renal measurements: 8.9 x 5.5 x 5.1 cm = volume: 129 mL. Mildly echogenic. Simple appearing cysts, not requiring imaging follow-up. No hydronephrosis. Bladder: Appears normal for degree of bladder distention. Calculated postvoid residual of 90.1 cc. Other: The examination was limited by the patient's inability to hold his breath and difficulty repositioning for the scan. IMPRESSION: 1. Mild-to-moderate right hydronephrosis and proximal ureteral dilatation, not previously present. This is concerning for a nonvisualized ureteral calculus or mass more distally. This could be further evaluated with an abdomen and pelvis CT without contrast. 2. Mildly  echogenic kidneys, consistent with medical renal disease. 3. Calculated postvoid residual of 90.1 cc. Electronically Signed   By: Elspeth Bathe M.D.   On: 03/23/2024 11:33    Scheduled Meds:  amLODipine   5 mg Oral Daily   amoxicillin -clavulanate  1 tablet Oral Q12H   Chlorhexidine  Gluconate Cloth  6 each Topical Daily   dexamethasone  (DECADRON ) injection  6 mg Intravenous Q24H   guaiFENesin   600 mg Oral BID   latanoprost   1 drop Both Eyes QHS   multivitamin with minerals  1 tablet Oral Daily   pantoprazole   40 mg Oral Daily   Rivaroxaban   20 mg Oral Q supper   Continuous Infusions:   PRN Meds: acetaminophen  **OR** acetaminophen , chlorpheniramine-HYDROcodone , ondansetron  (ZOFRAN ) IV, sodium chloride   flush  Time spent: 55 minutes  Author: ELVAN SOR. MD [facility] 03/23/2024 4:19 PM  To reach On-call, see care teams to locate the attending and reach out to them via [email or web address]. If 7PM-7AM, please contact night-coverage If you still have difficulty reaching the attending provider, please page the [facility] (Director on Call) for [facility] on amion for assistance.

## 2024-03-23 NOTE — Progress Notes (Signed)
 Physical Therapy Treatment Patient Details Name: Connor Morris MRN: [id number] DOB: 10/03/1933 Today's Date: 03/23/2024   History of Present Illness Pt is an 89 y/o male presenting to ED due to progressive weakness and syncope with questionable seizure activity. CT chest shows potential aspiration PNA. EEG negative, COVID+. PMH: dementia, bladder and prostate cancer, hx of DVT/PE, thoracic aortic aneurysm    PT Comments  Pt admitted with above diagnosis. Pt refused to get OOB and resisted supine to sit when attempted. Pt did agree to a few exercises and then refused to do more. Limited by pt today.  Family to take pt home and HHPT to f/u.  Pt currently with functional limitations due to the deficits listed below (see PT Problem List). Pt will benefit from acute skilled PT to increase their independence and safety with mobility to allow discharge.       If plan is discharge home, recommend the following: A little help with walking and/or transfers;A lot of help with bathing/dressing/bathroom;Assistance with cooking/housework;Assist for transportation;Help with stairs or ramp for entrance;Supervision due to cognitive status   Can travel by private vehicle        Equipment Recommendations  Wheelchair (measurements PT);Wheelchair cushion (measurements PT)    Recommendations for Other Services       Precautions / Restrictions Precautions Precautions: Fall Recall of Precautions/Restrictions: Impaired Restrictions Weight Bearing Restrictions Per Provider Order: No     Mobility  Bed Mobility Overal bed mobility: Needs Assistance Bed Mobility: Rolling, Sit to Supine, Supine to Sit Rolling: Supervision   Supine to sit: Mod assist, HOB elevated, Used rails Sit to supine: Min assist   General bed mobility comments: assist to lift trunk, scoot hips to EOB. Light Mod A however pt resisting and unable to get pt to sit up on EOB.    Transfers                         Ambulation/Gait                   Stairs             Wheelchair Mobility     Tilt Bed    Modified Rankin (Stroke Patients Only)       Balance                                            Communication Communication Communication: Impaired Factors Affecting Communication: Reduced clarity of speech  Cognition Arousal: Alert Behavior During Therapy: WFL for tasks assessed/performed   PT - Cognitive impairments: History of cognitive impairments                         Following commands: Impaired Following commands impaired: Only follows one step commands consistently, Follows one step commands with increased time    Cueing Cueing Techniques: Verbal cues, Gestural cues  Exercises General Exercises - Lower Extremity Ankle Circles/Pumps: AROM, Both, 5 reps, Supine Heel Slides: AROM, Both, 5 reps, Supine    General Comments        Pertinent Vitals/Pain Pain Assessment Pain Assessment: No/denies pain Faces Pain Scale: No hurt    Home Living                          Prior Function  PT Goals (current goals can now be found in the care plan section) Progress towards PT goals: Not progressing toward goals - comment (self lmiting)    Frequency    Min 2X/week      PT Plan      Co-evaluation              AM-PAC PT 6 Clicks Mobility   Outcome Measure  Help needed turning from your back to your side while in a flat bed without using bedrails?: A Little Help needed moving from lying on your back to sitting on the side of a flat bed without using bedrails?: A Little Help needed moving to and from a bed to a chair (including a wheelchair)?: A Little Help needed standing up from a chair using your arms (e.g., wheelchair or bedside chair)?: A Little Help needed to walk in hospital room?: A Lot Help needed climbing 3-5 steps with a railing? : Total 6 Click Score: 15    End of Session    Activity Tolerance:  (pt self limiting) Patient left: with call bell/phone within reach;in bed;with bed alarm set Nurse Communication: Mobility status PT Visit Diagnosis: Unsteadiness on feet (R26.81);Muscle weakness (generalized) (M62.81);Difficulty in walking, not elsewhere classified (R26.2)     Time: 8582-8570 PT Time Calculation (min) (ACUTE ONLY): 12 min  Charges:    $Therapeutic Exercise: 8-22 mins PT General Charges $$ ACUTE PT VISIT: 1 Visit                     Chas Axel M,PT Acute Rehab Services [phone]    Stephane JULIANNA Bevel 03/23/2024, 3:22 PM

## 2024-03-23 NOTE — Plan of Care (Signed)
  Problem: Education: Goal: Knowledge of General Education information will improve Description: Including pain rating scale, medication(s)/side effects and non-pharmacologic comfort measures Outcome: Progressing   Problem: Health Behavior/Discharge Planning: Goal: Ability to manage health-related needs will improve Outcome: Progressing   Problem: Clinical Measurements: Goal: Ability to maintain clinical measurements within normal limits will improve Outcome: Progressing Goal: Will remain free from infection Outcome: Progressing Goal: Diagnostic test results will improve Outcome: Progressing Goal: Respiratory complications will improve Outcome: Progressing Goal: Cardiovascular complication will be avoided Outcome: Progressing   Problem: Activity: Goal: Risk for activity intolerance will decrease Outcome: Progressing   Problem: Nutrition: Goal: Adequate nutrition will be maintained Outcome: Progressing   Problem: Coping: Goal: Level of anxiety will decrease Outcome: Progressing   Problem: Elimination: Goal: Will not experience complications related to bowel motility Outcome: Progressing Goal: Will not experience complications related to urinary retention Outcome: Progressing   Problem: Pain Managment: Goal: General experience of comfort will improve and/or be controlled Outcome: Progressing   Problem: Safety: Goal: Ability to remain free from injury will improve Outcome: Progressing   Problem: Skin Integrity: Goal: Risk for impaired skin integrity will decrease Outcome: Progressing   Problem: Activity: Goal: Ability to tolerate increased activity will improve Outcome: Progressing   Problem: Clinical Measurements: Goal: Ability to maintain a body temperature in the normal range will improve Outcome: Progressing   Problem: Respiratory: Goal: Ability to maintain adequate ventilation will improve Outcome: Progressing Goal: Ability to maintain a clear airway  will improve Outcome: Progressing   Problem: Education: Goal: Knowledge of risk factors and measures for prevention of condition will improve Outcome: Progressing   Problem: Coping: Goal: Psychosocial and spiritual needs will be supported Outcome: Progressing   Problem: Respiratory: Goal: Will maintain a patent airway Outcome: Progressing Goal: Complications related to the disease process, condition or treatment will be avoided or minimized Outcome: Progressing

## 2024-03-24 DIAGNOSIS — J690 Pneumonitis due to inhalation of food and vomit: Secondary | ICD-10-CM | POA: Diagnosis not present

## 2024-03-24 LAB — CBC
HCT: 34 % — ABNORMAL LOW (ref 39.0–52.0)
Hemoglobin: 10.9 g/dL — ABNORMAL LOW (ref 13.0–17.0)
MCH: 28.3 pg (ref 26.0–34.0)
MCHC: 32.1 g/dL (ref 30.0–36.0)
MCV: 88.3 fL (ref 80.0–100.0)
Platelets: 177 K/uL (ref 150–400)
RBC: 3.85 MIL/uL — ABNORMAL LOW (ref 4.22–5.81)
RDW: 13 % (ref 11.5–15.5)
WBC: 8.8 K/uL (ref 4.0–10.5)
nRBC: 0.2 % (ref 0.0–0.2)

## 2024-03-24 LAB — PHOSPHORUS: Phosphorus: 3 mg/dL (ref 2.5–4.6)

## 2024-03-24 LAB — BASIC METABOLIC PANEL WITH GFR
Anion gap: 8 (ref 5–15)
BUN: 23 mg/dL (ref 8–23)
CO2: 22 mmol/L (ref 22–32)
Calcium: 9 mg/dL (ref 8.9–10.3)
Chloride: 106 mmol/L (ref 98–111)
Creatinine, Ser: 1.27 mg/dL — ABNORMAL HIGH (ref 0.61–1.24)
GFR, Estimated: 54 mL/min — ABNORMAL LOW (ref >60)
Glucose, Bld: 109 mg/dL — ABNORMAL HIGH (ref 70–99)
Potassium: 4.3 mmol/L (ref 3.5–5.1)
Sodium: 136 mmol/L (ref 135–145)

## 2024-03-24 LAB — MAGNESIUM: Magnesium: 2.2 mg/dL (ref 1.7–2.4)

## 2024-03-24 MED ORDER — AMLODIPINE BESYLATE 5 MG PO TABS: 5.0000 mg | ORAL_TABLET | Freq: Every day | ORAL | 11 refills | Status: AC

## 2024-03-24 MED ORDER — GUAIFENESIN-DM 100-10 MG/5ML PO SYRP: 10.0000 mL | ORAL_SOLUTION | Freq: Four times a day (QID) | ORAL | 0 refills | Status: DC | PRN

## 2024-03-24 MED ORDER — PANTOPRAZOLE SODIUM 40 MG PO TBEC: 40.0000 mg | DELAYED_RELEASE_TABLET | Freq: Every day | ORAL | 0 refills | Status: DC

## 2024-03-24 NOTE — Plan of Care (Signed)
  Problem: Education: Goal: Knowledge of General Education information will improve Description: Including pain rating scale, medication(s)/side effects and non-pharmacologic comfort measures Outcome: Progressing   Problem: Health Behavior/Discharge Planning: Goal: Ability to manage health-related needs will improve Outcome: Progressing   Problem: Clinical Measurements: Goal: Ability to maintain clinical measurements within normal limits will improve Outcome: Progressing Goal: Will remain free from infection Outcome: Progressing Goal: Diagnostic test results will improve Outcome: Progressing Goal: Respiratory complications will improve Outcome: Progressing Goal: Cardiovascular complication will be avoided Outcome: Progressing   Problem: Activity: Goal: Risk for activity intolerance will decrease Outcome: Progressing   Problem: Nutrition: Goal: Adequate nutrition will be maintained Outcome: Progressing   Problem: Coping: Goal: Level of anxiety will decrease Outcome: Progressing   Problem: Elimination: Goal: Will not experience complications related to bowel motility Outcome: Progressing Goal: Will not experience complications related to urinary retention Outcome: Progressing   Problem: Pain Managment: Goal: General experience of comfort will improve and/or be controlled Outcome: Progressing   Problem: Safety: Goal: Ability to remain free from injury will improve Outcome: Progressing   Problem: Skin Integrity: Goal: Risk for impaired skin integrity will decrease Outcome: Progressing   Problem: Activity: Goal: Ability to tolerate increased activity will improve Outcome: Progressing   Problem: Clinical Measurements: Goal: Ability to maintain a body temperature in the normal range will improve Outcome: Progressing   Problem: Respiratory: Goal: Ability to maintain adequate ventilation will improve Outcome: Progressing Goal: Ability to maintain a clear airway  will improve Outcome: Progressing   Problem: Education: Goal: Knowledge of risk factors and measures for prevention of condition will improve Outcome: Progressing   Problem: Coping: Goal: Psychosocial and spiritual needs will be supported Outcome: Progressing   Problem: Respiratory: Goal: Will maintain a patent airway Outcome: Progressing Goal: Complications related to the disease process, condition or treatment will be avoided or minimized Outcome: Progressing

## 2024-03-24 NOTE — Discharge Summary (Signed)
 [facility] Discharge Summary   Patient: Connor Morris FMW:[id number]  PCP: Larnell Hamilton, MD  Date of admission: 03/19/2024   Date of discharge:  03/24/2024     Discharge Diagnoses:  Principal Problem:   Aspiration pneumonia (HCC) Active Problems:   AKI (acute kidney injury) (HCC)   Generalized weakness   Syncope   Hyponatremia   COVID   Admitted From: Home Disposition:  Home with [facility] services  Recommendations for Outpatient Follow-up:  Follow-up with PCP in 1 week Monitor BP at home and follow with PCP to titrate medication accordingly Chest x-ray after 4 weeks for resolution of pneumonia Follow-up with urology in 1 to 2 weeks for further management of bladder cancer and right hydroureteronephrosis Follow-up with general surgery for right inguinal hernia as an outpatient Follow-up with vascular surgery for ascending aortic aneurysm as an outpatient Patient is to follow-up with PCP for referral to general surgery and vascular surgery as an outpatient Follow up LABS/TEST:  As above   Follow-up Information     Larnell Hamilton, MD Follow up in 1 week(s).   Specialty: Internal Medicine Contact information: [address] [place] [address] [phone]                Diet recommendation: Regular diet  Activity: The patient is advised to gradually reintroduce usual activities, as tolerated  Discharge Condition: stable  Code Status: DNR -Limited  History of present illness: As per the H and P dictated on admission.  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 Clearing his throat and coughing and has a runny nose. Also reported 1 episode of shaking where he had to be sat down and then he became transiently unresponsive. In the emergency  room hemodynamically stable. On 2 L oxygen. Chest x-ray with hazy airspace and interstitial opacities in bilateral lower lobes left more than right suspicious for pneumonia or aspiration. COVID test was positive. Patient started on remdesivir  after consented from his daughter.    Assessment and Plan:   # COVID-19 viral pneumonia Acute hypoxic respiratory failure ruled out Patient was given supplemental O2 halogen for comfort CXR: Hazy airspace and interstitial opacities in the bilateral lower lungs, suspicious for pneumonia and/or aspiration. S/p Remdesivir  and Decadron  given during hospital stay.   S/p Unasyn  for suspected aspiration, 8/8 transition to Augmentin  twice daily to complete total 5-day course. WBC within normal range and procalcitonin negative, less likely bacterial infection. de-escalated antibiotics as above SLP eval done, did not notice any signs of aspiration, it may be a silent aspirate but we will just continue to monitor for now S/p Mucinex  600 mg p.o. BID and Tussionex prn for cough. Symptoms improved, patient does not need any more treatment.  Saturating well on room air.  Discharged on Robitussin DM as needed     # Hypertension 8/8 blood pressure significantly elevated Started amlodipine  5 mg p.o. daily Monitor BP and follow-up with PCP to titrate medication accordingly   # Syncope: Likely secondary to above.  S/p telemetry, no significant events Bradycardia during sleep, heart rate improved with activity and patient remained asymptomatic. CT head negative for any acute findings EEG without evidence of seizure.  Monitor. EKG: bradycardia, first-degree AV block, LAF block TTE LVEF 65 to 70%, grade 1 diastolic dysfunction.  Ascending aortic aneurysm.  No any other significant findings. Continue fall precautions Follow-up with PCP as an outpatient.   # AKI: Most likely due to dehydration S/p IV fluid for hydration. sCr 1.42>> 1.27 improved, continue oral  hydration.   # History of DVT and PE: Continue Xarelto  home dose  Head CT negative for trauma   # Ascending aortic aneurysm TTE shows ascending aortic dilatation 46 mm Patient was recommended to follow-up with PCP and vascular surgery as an outpatient.   # Generalized weakness could be secondary to COVID viral infection and hypoxemia. B12 and vitamin D  level above normal range   # Anemia: Iron profile within normal range # Dementia: AO x 2 # Hypophosphatemia, Phos repleted.  Resolved  # H/o urinary bladder cancer US  renal shows right hydronephrosis, recommended CT A/P to rule out distal calculi CT stone: Right hydroureteronephrosis without any obstruction, no calculi.  As per patient's daughter patient is following with urology as an outpatient and had cystoscopy done for bladder tumor.  Recommended to follow with urology in 1 to 2 weeks for further management  # Right inguinal hernia Incidental finding on CT scan showed right inguinal hernia containing cecum. No active issues at this time.  Patient was advised to follow-up with PCP to get referral for general surgery.   Body mass index is 23.31 kg/m.  Nutrition Interventions:  - Patient was instructed, not to drive, operate heavy machinery, perform activities at heights, swimming or participation in water  activities or provide baby sitting services while on Pain, Sleep and Anxiety Medications; until his outpatient Physician has advised to do so again.  - Also recommended to not to take more than prescribed Pain, Sleep and Anxiety Medications.  Patient was seen by physical therapy, who recommended Home health, which was arranged. On the day of the discharge the patient's vitals were stable, and no other acute medical condition were reported by patient. the patient was felt safe to be discharge at Home with Therapy.  Consultants: None Procedures: None  Discharge Exam: General: Appear in no distress, no Rash; Oral Mucosa Clear,  moist. Cardiovascular: S1 and S2 Present, no Murmur, Respiratory: normal respiratory effort, Bilateral Air entry present and no Crackles, no wheezes Abdomen: Bowel Sound present, Soft and no tenderness, right inguinal hernia Extremities: no Pedal edema, no calf tenderness Neurology: No focal neurological deficits, dementia AO x 1 to 2. affect appropriate.  Filed Weights   03/19/24 2252  Weight: 71.6 kg   Vitals:   03/24/24 0418 03/24/24 0905  BP: 139/65 (!) 149/82  Pulse: (!) 56 (!) 53  Resp: 14   Temp: 97.6 F (36.4 C) 97.6 F (36.4 C)  SpO2: 98% 99%    DISCHARGE MEDICATION: Allergies as of 03/24/2024       Reactions   Aspirin     Unknown reaction        Medication List     TAKE these medications    acetaminophen  325 MG tablet Commonly known as: TYLENOL  Take 2 tablets (650 mg total) by mouth every 6 (six) hours as needed for mild pain (or Fever >/= 101).   amLODipine  5 MG tablet Commonly known as: NORVASC  Take 1 tablet (5 mg total) by mouth daily. Start taking on: March 25, 2024   feeding supplement Liqd Take 237 mLs by mouth 2 (two) times daily between meals. What changed: when to take this   guaiFENesin -dextromethorphan 100-10 MG/5ML syrup Commonly known as: ROBITUSSIN DM Take 10 mLs by mouth every 6 (six) hours  as needed for cough.   latanoprost  0.005 % ophthalmic solution Commonly known as: XALATAN  Place 1 drop into both eyes at bedtime.   multivitamin with minerals tablet Take 1 tablet by mouth daily.   pantoprazole  40 MG tablet Commonly known as: Protonix  Take 1 tablet (40 mg total) by mouth daily for 7 days.   rivaroxaban  20 MG Tabs tablet Commonly known as: XARELTO  Take 20 mg by mouth in the morning.       Allergies  Allergen Reactions   Aspirin      Unknown reaction   Discharge Instructions     Call MD for:  difficulty breathing, headache or visual disturbances   Complete by: As directed    Call MD for:  extreme fatigue    Complete by: As directed    Call MD for:  persistant dizziness or light-headedness   Complete by: As directed    Call MD for:  persistant nausea and vomiting   Complete by: As directed    Call MD for:  severe uncontrolled pain   Complete by: As directed    Call MD for:  temperature >100.4   Complete by: As directed    Diet general   Complete by: As directed    Discharge instructions   Complete by: As directed    Follow-up with PCP in 1 week Monitor BP at home and follow with PCP to titrate medication accordingly Chest x-ray after 4 weeks for resolution of pneumonia Follow-up with urology in 1 to 2 weeks for further management of bladder cancer and right hydroureteronephrosis Follow-up with general surgery for right inguinal hernia as an outpatient Follow-up with vascular surgery for ascending aortic aneurysm as an outpatient Patient is to follow-up with PCP for referral to general surgery and vascular surgery as an outpatient   Increase activity slowly   Complete by: As directed        The results of significant diagnostics from this hospitalization (including imaging, microbiology, ancillary and laboratory) are listed below for reference.    Significant Diagnostic Studies: CT RENAL STONE STUDY Result Date: 03/23/2024 CLINICAL DATA:  Right-sided hydronephrosis. EXAM: CT ABDOMEN AND PELVIS WITHOUT CONTRAST TECHNIQUE: Multidetector CT imaging of the abdomen and pelvis was performed following the standard protocol without IV contrast. RADIATION DOSE REDUCTION: This exam was performed according to the departmental dose-optimization program which includes automated exposure control, adjustment of the mA and/or kV according to patient size and/or use of iterative reconstruction technique. COMPARISON:  CT abdomen and pelvis 08/16/2019 FINDINGS: Lower chest: Moderate emphysema present. There is a trace right pleural effusion. Hepatobiliary: No focal liver abnormality is seen. No gallstones,  gallbladder wall thickening, or biliary dilatation. Pancreas: Unremarkable. No pancreatic ductal dilatation or surrounding inflammatory changes. Spleen: Normal in size without focal abnormality. Adrenals/Urinary Tract: There is mild right-sided hydroureteronephrosis to the level of the bladder. No obstructing calculus identified. The bladder appears normal. Bilateral renal cysts are present, left greater than right. The largest simple cyst in the left kidney measures 3.2 cm. There is a rounded mildly hyperdense area in the superior pole the left kidney measuring 4.7 cm. This has mildly increased in size compared to the prior study. The adrenal glands are within normal limits. Stomach/Bowel: No evidence of bowel wall thickening, distention, or inflammatory changes. The appendix is not visualized. There are air-fluid levels scattered throughout the colon. There is a small hiatal hernia. The stomach is otherwise within normal limits. Vascular/Lymphatic: Aortic atherosclerosis. No enlarged abdominal or pelvic lymph nodes. Reproductive: Prostate  radiotherapy seeds are present. Other: There is a large right inguinal hernia containing the cecum. There is a small fat containing left inguinal hernia. There is no ascites or free intraperitoneal air. There is mild presacral edema. Musculoskeletal: The bones are diffusely osteopenic. Degenerative changes affect the spine. Chronic compression deformities of L1 and L2 appear unchanged. IMPRESSION: 1. Mild right-sided hydroureteronephrosis to the level of the bladder. No obstructing calculus identified. Findings are indeterminate and may be related to recently passed calculus or distal stricture or mass involving the ureter. 2. Large right inguinal hernia containing the cecum. No evidence for bowel obstruction. 3. Air-fluid levels in the colon can be seen with diarrheal illness. 4. Trace right pleural effusion. 5. 4.7 cm rounded mildly hyperdense area in the superior pole of the  left kidney has mildly increased in size compared to the prior study. This may represent a hemorrhagic or proteinaceous cyst, but is indeterminate. Recommend further evaluation with ultrasound or MRI. 6. Aortic atherosclerosis. Aortic Atherosclerosis (ICD10-I70.0). Electronically Signed   By: Greig Pique M.D.   On: 03/23/2024 18:49   US  RENAL Result Date: 03/23/2024 CLINICAL DATA:  Acute kidney injury. EXAM: RENAL / URINARY TRACT ULTRASOUND COMPLETE COMPARISON:  Abdomen and pelvis CT dated 08/16/2019. PET-CT dated 01/29/2022. FINDINGS: Right Kidney: Renal measurements: 8.8 x 5.4 x 5.4 cm = volume: 132 mL. 7 mm simple cyst not requiring imaging follow-up. Mildly echogenic. Mild-to-moderate dilatation of the right renal pelvis and proximal ureter, not previously present. Left Kidney: Renal measurements: 8.9 x 5.5 x 5.1 cm = volume: 129 mL. Mildly echogenic. Simple appearing cysts, not requiring imaging follow-up. No hydronephrosis. Bladder: Appears normal for degree of bladder distention. Calculated postvoid residual of 90.1 cc. Other: The examination was limited by the patient's inability to hold his breath and difficulty repositioning for the scan. IMPRESSION: 1. Mild-to-moderate right hydronephrosis and proximal ureteral dilatation, not previously present. This is concerning for a nonvisualized ureteral calculus or mass more distally. This could be further evaluated with an abdomen and pelvis CT without contrast. 2. Mildly echogenic kidneys, consistent with medical renal disease. 3. Calculated postvoid residual of 90.1 cc. Electronically Signed   By: Elspeth Bathe M.D.   On: 03/23/2024 11:33   ECHOCARDIOGRAM COMPLETE Result Date: 03/21/2024    ECHOCARDIOGRAM REPORT   Patient Name:   Connor Morris Date of Exam: 03/21/2024 Medical Rec #:  [id number]      Height:       69.0 in Accession #:    [id number]     Weight:       157.8 lb Date of Birth:  [birth year]      BSA:          1.868 m Patient Age:    89 years        BP:           163/65 mmHg Patient Gender: M              HR:           101 bpm. Exam Location:  Inpatient Procedure: 2D Echo, Cardiac Doppler, Color Doppler and Intracardiac            Opacification Agent (Both Spectral and Color Flow Doppler were            utilized during procedure). Indications:    Syncope  History:        Patient has prior history of Echocardiogram examinations, most  recent 04/06/2018. Signs/Symptoms:Syncope; Risk                 Factors:Dyslipidemia.  Sonographer:    Therisa Crouch Referring Phys: [id number] VASUNDHRA RATHORE IMPRESSIONS  1. Left ventricular ejection fraction, by estimation, is 65 to 70%. The left ventricle has normal function. The left ventricle has no regional wall motion abnormalities. There is mild left ventricular hypertrophy. Left ventricular diastolic parameters are consistent with Grade I diastolic dysfunction (impaired relaxation).  2. Right ventricular systolic function is normal. The right ventricular size is normal. Tricuspid regurgitation signal is inadequate for assessing PA pressure.  3. The mitral valve is grossly normal. Trivial mitral valve regurgitation. No evidence of mitral stenosis.  4. The aortic valve is abnormal. There is moderate calcification of the aortic valve. Aortic valve regurgitation is mild. Mild aortic valve stenosis. Aortic valve area, by VTI measures 2.14 cm. Aortic valve mean gradient measures 10.3 mmHg. Aortic valve Vmax measures 2.17 m/s.  5. Aortic dilatation noted. Aneurysm of the ascending aorta, measuring 46 mm. There is moderate dilatation of the ascending aorta.  6. The inferior vena cava is normal in size with greater than 50% respiratory variability, suggesting right atrial pressure of 3 mmHg. FINDINGS  Left Ventricle: Left ventricular ejection fraction, by estimation, is 65 to 70%. The left ventricle has normal function. The left ventricle has no regional wall motion abnormalities. The left ventricular internal cavity  size was normal in size. There is  mild left ventricular hypertrophy. Left ventricular diastolic parameters are consistent with Grade I diastolic dysfunction (impaired relaxation). Right Ventricle: The right ventricular size is normal. No increase in right ventricular wall thickness. Right ventricular systolic function is normal. Tricuspid regurgitation signal is inadequate for assessing PA pressure. Left Atrium: Left atrial size was normal in size. Right Atrium: Right atrial size was normal in size. Pericardium: There is no evidence of pericardial effusion. Mitral Valve: The mitral valve is grossly normal. Trivial mitral valve regurgitation. No evidence of mitral valve stenosis. Tricuspid Valve: The tricuspid valve is normal in structure. Tricuspid valve regurgitation is not demonstrated. No evidence of tricuspid stenosis. Aortic Valve: The aortic valve is abnormal. There is moderate calcification of the aortic valve. Aortic valve regurgitation is mild. Aortic regurgitation PHT measures 564 msec. Mild aortic stenosis is present. Aortic valve mean gradient measures 10.3 mmHg. Aortic valve peak gradient measures 18.8 mmHg. Aortic valve area, by VTI measures 2.14 cm. Pulmonic Valve: The pulmonic valve was normal in structure. Pulmonic valve regurgitation is not visualized. No evidence of pulmonic stenosis. Aorta: Aortic dilatation noted. There is moderate dilatation of the ascending aorta. There is an aneurysm involving the ascending aorta measuring 46 mm. Venous: The inferior vena cava is normal in size with greater than 50% respiratory variability, suggesting right atrial pressure of 3 mmHg. IAS/Shunts: No atrial level shunt detected by color flow Doppler.  LEFT VENTRICLE PLAX 2D LVIDd:         4.30 cm     Diastology LVIDs:         2.90 cm     LV e' medial:    5.75 cm/s LV PW:         1.20 cm     LV E/e' medial:  13.3 LV IVS:        1.00 cm     LV e' lateral:   7.77 cm/s LVOT diam:     2.30 cm     LV E/e' lateral:  9.8 LV SV:  106 LV SV Index:   57 LVOT Area:     4.15 cm  LV Volumes (MOD) LV vol d, MOD A2C: 60.8 ml LV vol d, MOD A4C: 69.0 ml LV vol s, MOD A2C: 25.4 ml LV vol s, MOD A4C: 25.0 ml LV SV MOD A2C:     35.4 ml LV SV MOD A4C:     69.0 ml LV SV MOD BP:      41.8 ml RIGHT VENTRICLE          IVC RV Basal diam:  3.40 cm  IVC diam: 1.30 cm LEFT ATRIUM             Index LA diam:        3.80 cm 2.03 cm/m LA Vol (A2C):   41.1 ml 22.00 ml/m LA Vol (A4C):   87.4 ml 46.78 ml/m LA Biplane Vol: 60.4 ml 32.33 ml/m  AORTIC VALVE AV Area (Vmax):    1.98 cm AV Area (Vmean):   1.94 cm AV Area (VTI):     2.14 cm AV Vmax:           216.67 cm/s AV Vmean:          148.333 cm/s AV VTI:            0.493 m AV Peak Grad:      18.8 mmHg AV Mean Grad:      10.3 mmHg LVOT Vmax:         103.00 cm/s LVOT Vmean:        69.250 cm/s LVOT VTI:          0.254 m LVOT/AV VTI ratio: 0.52 AI PHT:            564 msec  AORTA Ao Root diam: 3.70 cm Ao Asc diam:  4.60 cm MITRAL VALVE MV Area (PHT): 2.29 cm     SHUNTS MV Decel Time: 332 msec     Systemic VTI:  0.25 m MV E velocity: 76.40 cm/s   Systemic Diam: 2.30 cm MV A velocity: 106.00 cm/s MV E/A ratio:  0.72 Soyla Merck MD Electronically signed by Soyla Merck MD Signature Date/Time: 03/21/2024/4:50:57 PM    Final    EEG adult Result Date: 03/20/2024 Gregg Lek, MD     03/20/2024  9:24 AM Patient Name: Connor Morris MRN: [id number] Epilepsy Attending: Lek Gregg Referring Physician/Provider: No ref. provider found     Date: 03/20/2024 Duration: 23 minutes Patient history: 89 y.o. male with medical history significant of sinus bradycardia, hyperlipidemia, thoracic ascending aortic aneurysm, history of DVT/PE on Xarelto , bladder and prostate cancer, dementia, chronic normocytic anemia presents to the ED via EMS for evaluation of generalized weakness and syncope. EEG to evaluate for seizure. Level of alertness: Awake, drowsy, AEDs during EEG study: None Technical aspects: This EEG  study was done with scalp electrodes positioned according to the 10-20 International system of electrode placement. Electrical activity was reviewed with band pass filter of 1-70Hz , sensitivity of 7 uV/mm, display speed of 19mm/sec with a 60Hz  notched filter applied as appropriate. EEG data were recorded continuously and digitally stored.  Video monitoring was available and reviewed as appropriate. Description: The posterior dominant rhythm consists of 6-7 Hz activity of moderate voltage (25-35 uV) seen predominantly in posterior head regions, symmetric and reactive to eye opening and eye closing. Drowsiness was characterized by attenuation of the posterior background rhythm. Sleep was not seen.  Hyperventilation and photic stimulation were not performed.   ABNORMALITY - Continuous slow, generalized  IMPRESSION: This study is suggestive of mild diffuse encephalopathy, nonspecific etiology but likely related to sedation, toxic-metabolic etiology. No seizures or epileptiform discharges were seen throughout the recording. Pastor Falling MD Neurology    CT HEAD WO CONTRAST ( ) Result Date: 03/20/2024 EXAM: CT HEAD WITHOUT 03/20/2024 02:38:25 AM TECHNIQUE: CT of the head was performed without the administration of intravenous contrast. Automated exposure control, iterative reconstruction, and/or weight based adjustment of the mA/kV was utilized to reduce the radiation dose to as low as reasonably achievable. COMPARISON: 03/26/2022 CLINICAL HISTORY: Syncope/presyncope, cerebrovascular cause suspected. FINDINGS: BRAIN AND VENTRICLES: No acute intracranial hemorrhage. No mass effect or midline shift. No extra-axial fluid collection. Gray-white differentiation is maintained. No hydrocephalus. Generalized volume loss. Chronic ischemic white matter changes. ORBITS: No acute abnormality. SINUSES AND MASTOIDS: No acute abnormality. SOFT TISSUES AND SKULL: No acute skull fracture. No acute soft tissue abnormality. IMPRESSION:  1. No acute intracranial abnormality. 2. Generalized volume loss and chronic ischemic white matter changes. Electronically signed by: Franky Stanford MD 03/20/2024 02:54 AM EDT RP Workstation: [id number]   DG Chest Port 1 View Result Date: 03/19/2024 EXAM: 1 VIEW XRAY OF THE CHEST 03/19/2024 06:58:00 PM COMPARISON: 06/16/2022 CLINICAL HISTORY: Syncope. Triage notes: PT BIB EMS, initially called out for weakness, then had a syncopal episode, but per family is back to baseline, history of dementia. Family advised EMS that he has not been eating and drinking.; BP 152/90; HR 76; O2 96% RA; CBG 121 FINDINGS: LUNGS AND PLEURA: Hazy airspace and interstitial opacities in the bilateral lower lungs, greater on the left than the right, suspicious for pneumonia and/or aspiration. No pleural effusion. No pneumothorax. HEART AND MEDIASTINUM: Stable cardiomediastinal silhouette. BONES AND SOFT TISSUES: No acute osseous abnormality. IMPRESSION: 1. Hazy airspace and interstitial opacities in the bilateral lower lungs, suspicious for pneumonia and/or aspiration. Electronically signed by: Norman Gatlin MD 03/19/2024 07:56 PM EDT RP Workstation: [id number]    Microbiology: Recent Results (from the past 240 hours)  Resp panel by RT-PCR (RSV, Flu A&B, Covid) Anterior Nasal Swab     Status: Abnormal   Collection Time: 03/20/24  1:38 AM   Specimen: Anterior Nasal Swab  Result Value Ref Range Status   SARS Coronavirus 2 by RT PCR POSITIVE (A) NEGATIVE Final   Influenza A by PCR NEGATIVE NEGATIVE Final   Influenza B by PCR NEGATIVE NEGATIVE Final    Comment: (NOTE) The Xpert Xpress SARS-CoV-2/FLU/RSV plus assay is intended as an aid in the diagnosis of influenza from Nasopharyngeal swab specimens and should not be used as a sole basis for treatment. Nasal washings and aspirates are unacceptable for Xpert Xpress SARS-CoV-2/FLU/RSV testing.  Fact Sheet for Patients: BloggerCourse.com  Fact Sheet  for Healthcare Providers: SeriousBroker.it  This test is not yet approved or cleared by the [place]  FDA and has been authorized for detection and/or diagnosis of SARS-CoV-2 by FDA under an Emergency Use Authorization (EUA). This EUA will remain in effect (meaning this test can be used) for the duration of the COVID-19 declaration under Section 564(b)(1) of the Act, 21 U.S.C. section 360bbb-3(b)(1), unless the authorization is terminated or revoked.     Resp Syncytial Virus by PCR NEGATIVE NEGATIVE Final    Comment: (NOTE) Fact Sheet for Patients: BloggerCourse.com  Fact Sheet for Healthcare Providers: SeriousBroker.it  This test is not yet approved or cleared by the [place]  FDA and has been authorized for detection and/or diagnosis of SARS-CoV-2 by FDA under an Emergency Use Authorization (EUA). This EUA will remain in  effect (meaning this test can be used) for the duration of the COVID-19 declaration under Section 564(b)(1) of the Act, 21 U.S.C. section 360bbb-3(b)(1), unless the authorization is terminated or revoked.  Performed at [facility], [address] [place], [place] [address]   MRSA Next Gen by PCR, Nasal     Status: None   Collection Time: 03/20/24  1:38 AM   Specimen: Nasal Mucosa; Nasal Swab  Result Value Ref Range Status   MRSA by PCR Next Gen NOT DETECTED NOT DETECTED Final    Comment: (NOTE) The GeneXpert MRSA Assay (FDA approved for NASAL specimens only), is one component of a comprehensive MRSA colonization surveillance program. It is not intended to diagnose MRSA infection nor to guide or monitor treatment for MRSA infections. Test performance is not FDA approved in patients less than 56 years old. Performed at [facility], [address] [place], [place] [address]      Labs: CBC: Recent Labs  Lab 03/19/24 1804 03/21/24 0404 03/22/24 0349  03/23/24 0237 03/23/24 1903 03/24/24 0333  WBC 6.3 5.7 8.5 9.0  --  8.8  NEUTROABS 4.3 3.4  --   --   --   --   HGB 12.6* 11.8* 10.2* 9.9* 11.4* 10.9*  HCT 40.3 37.6* 32.2* 30.7* 36.0* 34.0*  MCV 89.4 91.0 89.2 88.7  --  88.3  PLT 153 141* 173 226  --  177   Basic Metabolic Panel: Recent Labs  Lab 03/19/24 1804 03/20/24 0356 03/21/24 0404 03/22/24 0349 03/23/24 0237 03/24/24 0333  NA 133* 135 137 137 143 136  K 4.4 4.2 4.5 4.4 4.1 4.3  CL 100 103 108 109 108 106  CO2 22 24 20* 21* 23 22  GLUCOSE 113* 95 103* 100* 99 109*  BUN 31* 29* 30* 35* 32* 23  CREATININE 1.55* 1.42* 1.33* 1.26* 1.40* 1.27*  CALCIUM 9.4 9.1 9.1 8.7* 8.9 9.0  MG 1.8  --  2.1 1.9 2.1 2.2  PHOS  --   --  4.5 2.3* 2.8 3.0   Liver Function Tests: Recent Labs  Lab 03/19/24 1804 03/21/24 0404  AST 35 39  ALT 16 20  ALKPHOS 35* 31*  BILITOT 1.2 1.4*  PROT 7.6 7.2  ALBUMIN 3.5 3.2*   No results for input(s): LIPASE, AMYLASE in the last 168 hours. No results for input(s): AMMONIA in the last 168 hours. Cardiac Enzymes: No results for input(s): CKTOTAL, CKMB, CKMBINDEX, TROPONINI in the last 168 hours. BNP (last 3 results) No results for input(s): BNP in the last 8760 hours. CBG: No results for input(s): GLUCAP in the last 168 hours.  Time spent: 35 minutes  Signed:  Elvan Sor  [facility] 03/24/2024 11:53 AM

## 2024-03-28 DIAGNOSIS — C61 Malignant neoplasm of prostate: Secondary | ICD-10-CM | POA: Diagnosis not present

## 2024-03-28 DIAGNOSIS — N1330 Unspecified hydronephrosis: Secondary | ICD-10-CM | POA: Diagnosis not present

## 2024-03-28 DIAGNOSIS — J1282 Pneumonia due to coronavirus disease 2019: Secondary | ICD-10-CM | POA: Diagnosis not present

## 2024-03-28 DIAGNOSIS — C679 Malignant neoplasm of bladder, unspecified: Secondary | ICD-10-CM | POA: Diagnosis not present

## 2024-03-28 DIAGNOSIS — D630 Anemia in neoplastic disease: Secondary | ICD-10-CM | POA: Diagnosis not present

## 2024-03-28 DIAGNOSIS — U071 COVID-19: Secondary | ICD-10-CM | POA: Diagnosis not present

## 2024-03-28 DIAGNOSIS — F0390 Unspecified dementia without behavioral disturbance: Secondary | ICD-10-CM | POA: Diagnosis not present

## 2024-03-28 DIAGNOSIS — J690 Pneumonitis due to inhalation of food and vomit: Secondary | ICD-10-CM | POA: Diagnosis not present

## 2024-03-28 DIAGNOSIS — N179 Acute kidney failure, unspecified: Secondary | ICD-10-CM | POA: Diagnosis not present

## 2024-03-29 DIAGNOSIS — C679 Malignant neoplasm of bladder, unspecified: Secondary | ICD-10-CM | POA: Diagnosis not present

## 2024-03-29 DIAGNOSIS — N179 Acute kidney failure, unspecified: Secondary | ICD-10-CM | POA: Diagnosis not present

## 2024-03-29 DIAGNOSIS — C61 Malignant neoplasm of prostate: Secondary | ICD-10-CM | POA: Diagnosis not present

## 2024-03-29 DIAGNOSIS — N1330 Unspecified hydronephrosis: Secondary | ICD-10-CM | POA: Diagnosis not present

## 2024-03-29 DIAGNOSIS — J690 Pneumonitis due to inhalation of food and vomit: Secondary | ICD-10-CM | POA: Diagnosis not present

## 2024-03-29 DIAGNOSIS — F0390 Unspecified dementia without behavioral disturbance: Secondary | ICD-10-CM | POA: Diagnosis not present

## 2024-03-29 DIAGNOSIS — U071 COVID-19: Secondary | ICD-10-CM | POA: Diagnosis not present

## 2024-03-29 DIAGNOSIS — D630 Anemia in neoplastic disease: Secondary | ICD-10-CM | POA: Diagnosis not present

## 2024-03-29 DIAGNOSIS — J1282 Pneumonia due to coronavirus disease 2019: Secondary | ICD-10-CM | POA: Diagnosis not present

## 2024-03-30 DIAGNOSIS — J1282 Pneumonia due to coronavirus disease 2019: Secondary | ICD-10-CM | POA: Diagnosis not present

## 2024-03-30 DIAGNOSIS — I719 Aortic aneurysm of unspecified site, without rupture: Secondary | ICD-10-CM | POA: Diagnosis not present

## 2024-03-30 DIAGNOSIS — C61 Malignant neoplasm of prostate: Secondary | ICD-10-CM | POA: Diagnosis not present

## 2024-03-30 DIAGNOSIS — I10 Essential (primary) hypertension: Secondary | ICD-10-CM | POA: Diagnosis not present

## 2024-03-30 DIAGNOSIS — R55 Syncope and collapse: Secondary | ICD-10-CM | POA: Diagnosis not present

## 2024-03-30 DIAGNOSIS — C679 Malignant neoplasm of bladder, unspecified: Secondary | ICD-10-CM | POA: Diagnosis not present

## 2024-03-30 DIAGNOSIS — U071 COVID-19: Secondary | ICD-10-CM | POA: Diagnosis not present

## 2024-03-30 DIAGNOSIS — E46 Unspecified protein-calorie malnutrition: Secondary | ICD-10-CM | POA: Diagnosis not present

## 2024-03-30 DIAGNOSIS — N179 Acute kidney failure, unspecified: Secondary | ICD-10-CM | POA: Diagnosis not present

## 2024-04-04 DIAGNOSIS — J690 Pneumonitis due to inhalation of food and vomit: Secondary | ICD-10-CM | POA: Diagnosis not present

## 2024-04-04 DIAGNOSIS — D630 Anemia in neoplastic disease: Secondary | ICD-10-CM | POA: Diagnosis not present

## 2024-04-04 DIAGNOSIS — J1282 Pneumonia due to coronavirus disease 2019: Secondary | ICD-10-CM | POA: Diagnosis not present

## 2024-04-04 DIAGNOSIS — N179 Acute kidney failure, unspecified: Secondary | ICD-10-CM | POA: Diagnosis not present

## 2024-04-04 DIAGNOSIS — C61 Malignant neoplasm of prostate: Secondary | ICD-10-CM | POA: Diagnosis not present

## 2024-04-04 DIAGNOSIS — U071 COVID-19: Secondary | ICD-10-CM | POA: Diagnosis not present

## 2024-04-04 DIAGNOSIS — F0390 Unspecified dementia without behavioral disturbance: Secondary | ICD-10-CM | POA: Diagnosis not present

## 2024-04-04 DIAGNOSIS — N1330 Unspecified hydronephrosis: Secondary | ICD-10-CM | POA: Diagnosis not present

## 2024-04-04 DIAGNOSIS — C679 Malignant neoplasm of bladder, unspecified: Secondary | ICD-10-CM | POA: Diagnosis not present

## 2024-04-06 DIAGNOSIS — F0390 Unspecified dementia without behavioral disturbance: Secondary | ICD-10-CM | POA: Diagnosis not present

## 2024-04-06 DIAGNOSIS — N1330 Unspecified hydronephrosis: Secondary | ICD-10-CM | POA: Diagnosis not present

## 2024-04-06 DIAGNOSIS — J1282 Pneumonia due to coronavirus disease 2019: Secondary | ICD-10-CM | POA: Diagnosis not present

## 2024-04-06 DIAGNOSIS — D630 Anemia in neoplastic disease: Secondary | ICD-10-CM | POA: Diagnosis not present

## 2024-04-06 DIAGNOSIS — C61 Malignant neoplasm of prostate: Secondary | ICD-10-CM | POA: Diagnosis not present

## 2024-04-06 DIAGNOSIS — U071 COVID-19: Secondary | ICD-10-CM | POA: Diagnosis not present

## 2024-04-06 DIAGNOSIS — N179 Acute kidney failure, unspecified: Secondary | ICD-10-CM | POA: Diagnosis not present

## 2024-04-06 DIAGNOSIS — C679 Malignant neoplasm of bladder, unspecified: Secondary | ICD-10-CM | POA: Diagnosis not present

## 2024-04-06 DIAGNOSIS — J690 Pneumonitis due to inhalation of food and vomit: Secondary | ICD-10-CM | POA: Diagnosis not present

## 2024-04-07 ENCOUNTER — Inpatient Hospital Stay (HOSPITAL_COMMUNITY)
Admission: EM | Admit: 2024-04-07 | Discharge: 2024-04-13 | DRG: 871 | Disposition: A | Discharge status: Skilled Nursing Facility | Attending: Internal Medicine | Admitting: Internal Medicine

## 2024-04-07 ENCOUNTER — Inpatient Hospital Stay (HOSPITAL_COMMUNITY)

## 2024-04-07 ENCOUNTER — Other Ambulatory Visit: Payer: Self-pay

## 2024-04-07 ENCOUNTER — Encounter (HOSPITAL_COMMUNITY): Payer: Self-pay

## 2024-04-07 ENCOUNTER — Emergency Department (HOSPITAL_COMMUNITY)

## 2024-04-07 DIAGNOSIS — N1330 Unspecified hydronephrosis: Secondary | ICD-10-CM | POA: Diagnosis not present

## 2024-04-07 DIAGNOSIS — R6520 Severe sepsis without septic shock: Secondary | ICD-10-CM | POA: Diagnosis present

## 2024-04-07 DIAGNOSIS — R791 Abnormal coagulation profile: Secondary | ICD-10-CM | POA: Diagnosis present

## 2024-04-07 DIAGNOSIS — K219 Gastro-esophageal reflux disease without esophagitis: Secondary | ICD-10-CM | POA: Diagnosis present

## 2024-04-07 DIAGNOSIS — N390 Urinary tract infection, site not specified: Secondary | ICD-10-CM | POA: Diagnosis not present

## 2024-04-07 DIAGNOSIS — A4151 Sepsis due to Escherichia coli [E. coli]: Principal | ICD-10-CM | POA: Diagnosis present

## 2024-04-07 DIAGNOSIS — F0390 Unspecified dementia without behavioral disturbance: Secondary | ICD-10-CM | POA: Diagnosis not present

## 2024-04-07 DIAGNOSIS — Z961 Presence of intraocular lens: Secondary | ICD-10-CM | POA: Diagnosis present

## 2024-04-07 DIAGNOSIS — Z86711 Personal history of pulmonary embolism: Secondary | ICD-10-CM

## 2024-04-07 DIAGNOSIS — I10 Essential (primary) hypertension: Secondary | ICD-10-CM | POA: Diagnosis present

## 2024-04-07 DIAGNOSIS — I1310 Hypertensive heart and chronic kidney disease without heart failure, with stage 1 through stage 4 chronic kidney disease, or unspecified chronic kidney disease: Secondary | ICD-10-CM | POA: Diagnosis present

## 2024-04-07 DIAGNOSIS — N1831 Chronic kidney disease, stage 3a: Secondary | ICD-10-CM | POA: Diagnosis present

## 2024-04-07 DIAGNOSIS — D689 Coagulation defect, unspecified: Secondary | ICD-10-CM | POA: Diagnosis present

## 2024-04-07 DIAGNOSIS — Z8546 Personal history of malignant neoplasm of prostate: Secondary | ICD-10-CM

## 2024-04-07 DIAGNOSIS — Z8249 Family history of ischemic heart disease and other diseases of the circulatory system: Secondary | ICD-10-CM | POA: Diagnosis not present

## 2024-04-07 DIAGNOSIS — Z79899 Other long term (current) drug therapy: Secondary | ICD-10-CM | POA: Diagnosis not present

## 2024-04-07 DIAGNOSIS — U071 COVID-19: Secondary | ICD-10-CM | POA: Diagnosis not present

## 2024-04-07 DIAGNOSIS — Z7901 Long term (current) use of anticoagulants: Secondary | ICD-10-CM

## 2024-04-07 DIAGNOSIS — R404 Transient alteration of awareness: Secondary | ICD-10-CM | POA: Diagnosis not present

## 2024-04-07 DIAGNOSIS — A419 Sepsis, unspecified organism: Principal | ICD-10-CM | POA: Diagnosis present

## 2024-04-07 DIAGNOSIS — Z7189 Other specified counseling: Secondary | ICD-10-CM | POA: Diagnosis not present

## 2024-04-07 DIAGNOSIS — K5900 Constipation, unspecified: Secondary | ICD-10-CM | POA: Diagnosis not present

## 2024-04-07 DIAGNOSIS — R6521 Severe sepsis with septic shock: Secondary | ICD-10-CM | POA: Diagnosis not present

## 2024-04-07 DIAGNOSIS — N179 Acute kidney failure, unspecified: Secondary | ICD-10-CM | POA: Diagnosis not present

## 2024-04-07 DIAGNOSIS — E785 Hyperlipidemia, unspecified: Secondary | ICD-10-CM | POA: Diagnosis present

## 2024-04-07 DIAGNOSIS — C679 Malignant neoplasm of bladder, unspecified: Secondary | ICD-10-CM | POA: Diagnosis present

## 2024-04-07 DIAGNOSIS — J439 Emphysema, unspecified: Secondary | ICD-10-CM | POA: Diagnosis present

## 2024-04-07 DIAGNOSIS — Z66 Do not resuscitate: Secondary | ICD-10-CM | POA: Diagnosis not present

## 2024-04-07 DIAGNOSIS — N10 Acute tubulo-interstitial nephritis: Secondary | ICD-10-CM | POA: Diagnosis not present

## 2024-04-07 DIAGNOSIS — E8809 Other disorders of plasma-protein metabolism, not elsewhere classified: Secondary | ICD-10-CM | POA: Diagnosis present

## 2024-04-07 DIAGNOSIS — I7121 Aneurysm of the ascending aorta, without rupture: Secondary | ICD-10-CM | POA: Diagnosis present

## 2024-04-07 DIAGNOSIS — Z8616 Personal history of COVID-19: Secondary | ICD-10-CM

## 2024-04-07 DIAGNOSIS — J984 Other disorders of lung: Secondary | ICD-10-CM | POA: Diagnosis not present

## 2024-04-07 DIAGNOSIS — I2510 Atherosclerotic heart disease of native coronary artery without angina pectoris: Secondary | ICD-10-CM | POA: Diagnosis present

## 2024-04-07 DIAGNOSIS — N136 Pyonephrosis: Secondary | ICD-10-CM | POA: Diagnosis present

## 2024-04-07 DIAGNOSIS — Z833 Family history of diabetes mellitus: Secondary | ICD-10-CM

## 2024-04-07 DIAGNOSIS — R001 Bradycardia, unspecified: Secondary | ICD-10-CM | POA: Diagnosis not present

## 2024-04-07 DIAGNOSIS — D696 Thrombocytopenia, unspecified: Secondary | ICD-10-CM | POA: Diagnosis present

## 2024-04-07 DIAGNOSIS — N3001 Acute cystitis with hematuria: Secondary | ICD-10-CM | POA: Diagnosis present

## 2024-04-07 DIAGNOSIS — Z515 Encounter for palliative care: Secondary | ICD-10-CM | POA: Diagnosis not present

## 2024-04-07 DIAGNOSIS — E871 Hypo-osmolality and hyponatremia: Secondary | ICD-10-CM | POA: Diagnosis present

## 2024-04-07 DIAGNOSIS — R509 Fever, unspecified: Secondary | ICD-10-CM | POA: Diagnosis not present

## 2024-04-07 DIAGNOSIS — K5790 Diverticulosis of intestine, part unspecified, without perforation or abscess without bleeding: Secondary | ICD-10-CM | POA: Diagnosis not present

## 2024-04-07 DIAGNOSIS — G9341 Metabolic encephalopathy: Secondary | ICD-10-CM | POA: Diagnosis not present

## 2024-04-07 DIAGNOSIS — D638 Anemia in other chronic diseases classified elsewhere: Secondary | ICD-10-CM | POA: Diagnosis not present

## 2024-04-07 DIAGNOSIS — I517 Cardiomegaly: Secondary | ICD-10-CM | POA: Diagnosis not present

## 2024-04-07 DIAGNOSIS — Z751 Person awaiting admission to adequate facility elsewhere: Secondary | ICD-10-CM

## 2024-04-07 DIAGNOSIS — Z8551 Personal history of malignant neoplasm of bladder: Secondary | ICD-10-CM

## 2024-04-07 DIAGNOSIS — E8721 Acute metabolic acidosis: Secondary | ICD-10-CM | POA: Diagnosis present

## 2024-04-07 DIAGNOSIS — Z87891 Personal history of nicotine dependence: Secondary | ICD-10-CM

## 2024-04-07 DIAGNOSIS — Z7401 Bed confinement status: Secondary | ICD-10-CM | POA: Diagnosis not present

## 2024-04-07 DIAGNOSIS — Z86718 Personal history of other venous thrombosis and embolism: Secondary | ICD-10-CM

## 2024-04-07 LAB — URINALYSIS, W/ REFLEX TO CULTURE (INFECTION SUSPECTED)
Bilirubin Urine: NEGATIVE
Glucose, UA: NEGATIVE mg/dL
Ketones, ur: NEGATIVE mg/dL
Nitrite: POSITIVE — AB
Protein, ur: 30 mg/dL — AB
Specific Gravity, Urine: 1.013 (ref 1.005–1.030)
WBC, UA: >50 WBC/hpf (ref 0–5)
pH: 7 (ref 5.0–8.0)

## 2024-04-07 LAB — CBC WITH DIFFERENTIAL/PLATELET
Abs Immature Granulocytes: 0.1 K/uL — ABNORMAL HIGH (ref 0.00–0.07)
Basophils Absolute: 0 K/uL (ref 0.0–0.1)
Basophils Relative: 0 %
Eosinophils Absolute: 0 K/uL (ref 0.0–0.5)
Eosinophils Relative: 0 %
HCT: 37.5 % — ABNORMAL LOW (ref 39.0–52.0)
Hemoglobin: 11.6 g/dL — ABNORMAL LOW (ref 13.0–17.0)
Immature Granulocytes: 1 %
Lymphocytes Relative: 6 %
Lymphs Abs: 0.9 K/uL (ref 0.7–4.0)
MCH: 28.6 pg (ref 26.0–34.0)
MCHC: 30.9 g/dL (ref 30.0–36.0)
MCV: 92.4 fL (ref 80.0–100.0)
Monocytes Absolute: 1 K/uL (ref 0.1–1.0)
Monocytes Relative: 6 %
Neutro Abs: 14 K/uL — ABNORMAL HIGH (ref 1.7–7.7)
Neutrophils Relative %: 87 %
Platelets: 216 K/uL (ref 150–400)
RBC: 4.06 MIL/uL — ABNORMAL LOW (ref 4.22–5.81)
RDW: 14.4 % (ref 11.5–15.5)
WBC: 16 K/uL — ABNORMAL HIGH (ref 4.0–10.5)
nRBC: 0 % (ref 0.0–0.2)

## 2024-04-07 LAB — RESP PANEL BY RT-PCR (RSV, FLU A&B, COVID)  RVPGX2
Influenza A by PCR: NEGATIVE
Influenza B by PCR: NEGATIVE
Resp Syncytial Virus by PCR: NEGATIVE
SARS Coronavirus 2 by RT PCR: POSITIVE — AB

## 2024-04-07 LAB — PROTIME-INR
INR: 6.1 (ref 0.8–1.2)
Prothrombin Time: 56.6 s — ABNORMAL HIGH (ref 11.4–15.2)

## 2024-04-07 LAB — COMPREHENSIVE METABOLIC PANEL WITH GFR
ALT: 12 U/L (ref 0–44)
AST: 20 U/L (ref 15–41)
Albumin: 2.8 g/dL — ABNORMAL LOW (ref 3.5–5.0)
Alkaline Phosphatase: 40 U/L (ref 38–126)
Anion gap: 9 (ref 5–15)
BUN: 29 mg/dL — ABNORMAL HIGH (ref 8–23)
CO2: 23 mmol/L (ref 22–32)
Calcium: 9.5 mg/dL (ref 8.9–10.3)
Chloride: 102 mmol/L (ref 98–111)
Creatinine, Ser: 1.8 mg/dL — ABNORMAL HIGH (ref 0.61–1.24)
GFR, Estimated: 36 mL/min — ABNORMAL LOW (ref >60)
Glucose, Bld: 101 mg/dL — ABNORMAL HIGH (ref 70–99)
Potassium: 5.1 mmol/L (ref 3.5–5.1)
Sodium: 134 mmol/L — ABNORMAL LOW (ref 135–145)
Total Bilirubin: 1.4 mg/dL — ABNORMAL HIGH (ref 0.0–1.2)
Total Protein: 7.1 g/dL (ref 6.5–8.1)

## 2024-04-07 LAB — I-STAT CG4 LACTIC ACID, ED
Lactic Acid, Venous: 3.6 mmol/L (ref 0.5–1.9)
Lactic Acid, Venous: 2.4 mmol/L (ref 0.5–1.9)

## 2024-04-07 MED ORDER — METRONIDAZOLE 500 MG/100ML IV SOLN
500.0000 mg | Freq: Once | INTRAVENOUS | Status: AC
  Administered 2024-04-07: 500 mg via INTRAVENOUS
  Filled 2024-04-07: qty 100

## 2024-04-07 MED ORDER — LACTATED RINGERS IV BOLUS (SEPSIS)
500.0000 mL | Freq: Once | INTRAVENOUS | Status: AC
  Administered 2024-04-07: 500 mL via INTRAVENOUS

## 2024-04-07 MED ORDER — ACETAMINOPHEN 500 MG PO TABS: 1000.0000 mg | ORAL_TABLET | Freq: Once | ORAL | Status: DC

## 2024-04-07 MED ORDER — LACTATED RINGERS IV SOLN: INTRAVENOUS | Status: AC

## 2024-04-07 MED ORDER — SODIUM CHLORIDE 0.9 % IV SOLN
2.0000 g | Freq: Two times a day (BID) | INTRAVENOUS | Status: DC
  Administered 2024-04-08: 2 g via INTRAVENOUS
  Filled 2024-04-07: qty 12.5

## 2024-04-07 MED ORDER — SODIUM CHLORIDE 0.9% FLUSH
3.0000 mL | Freq: Two times a day (BID) | INTRAVENOUS | Status: DC
  Administered 2024-04-07 – 2024-04-13 (×11): 3 mL via INTRAVENOUS

## 2024-04-07 MED ORDER — KETOROLAC TROMETHAMINE 15 MG/ML IJ SOLN
15.0000 mg | Freq: Once | INTRAMUSCULAR | Status: AC
  Administered 2024-04-07: 15 mg via INTRAVENOUS
  Filled 2024-04-07: qty 1

## 2024-04-07 MED ORDER — LACTATED RINGERS IV SOLN: INTRAVENOUS | Status: DC

## 2024-04-07 MED ORDER — PANTOPRAZOLE SODIUM 40 MG PO TBEC: 40.0000 mg | DELAYED_RELEASE_TABLET | Freq: Every day | ORAL | Status: DC

## 2024-04-07 MED ORDER — LACTATED RINGERS IV BOLUS (SEPSIS)
1500.0000 mL | Freq: Once | INTRAVENOUS | Status: AC
Start: 2024-04-07 — End: 2024-04-08
  Administered 2024-04-07: 1500 mL via INTRAVENOUS

## 2024-04-07 MED ORDER — LATANOPROST 0.005 % OP SOLN
1.0000 [drp] | Freq: Every day | OPHTHALMIC | Status: DC
  Administered 2024-04-08 – 2024-04-12 (×4): 1 [drp] via OPHTHALMIC
  Filled 2024-04-07: qty 2.5

## 2024-04-07 MED ORDER — SENNA 8.6 MG PO TABS: 1.0000 | ORAL_TABLET | Freq: Every day | ORAL | Status: DC | PRN

## 2024-04-07 MED ORDER — PROCHLORPERAZINE EDISYLATE 10 MG/2ML IJ SOLN: 5.0000 mg | Freq: Four times a day (QID) | INTRAMUSCULAR | Status: DC | PRN

## 2024-04-07 MED ORDER — ACETAMINOPHEN 325 MG PO TABS
650.0000 mg | ORAL_TABLET | Freq: Four times a day (QID) | ORAL | Status: DC | PRN
  Administered 2024-04-08 – 2024-04-13 (×3): 650 mg via ORAL
  Filled 2024-04-07 (×3): qty 2

## 2024-04-07 MED ORDER — SODIUM CHLORIDE 0.9 % IV SOLN
2.0000 g | Freq: Once | INTRAVENOUS | Status: AC
  Administered 2024-04-07: 2 g via INTRAVENOUS
  Filled 2024-04-07: qty 12.5

## 2024-04-07 MED ORDER — ACETAMINOPHEN 650 MG RE SUPP: 650.0000 mg | Freq: Four times a day (QID) | RECTAL | Status: DC | PRN

## 2024-04-07 MED ORDER — ACETAMINOPHEN 650 MG RE SUPP
650.0000 mg | Freq: Once | RECTAL | Status: AC
  Administered 2024-04-07: 650 mg via RECTAL
  Filled 2024-04-07: qty 1

## 2024-04-07 NOTE — ED Provider Notes (Signed)
  EMERGENCY DEPARTMENT AT [facility] Provider Note   CSN: [id number] Arrival date & time: 04/07/24  1617     Patient presents with: Altered Mental Status   Connor Morris is a 89 y.o. male.   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  Patient had been doing okay since he got home until yesterday when family noticed that his urine was dark he was shaking and not acting like himself.  He has had decreased intake but denied any vomiting or diarrhea.  Patient denies any pain in his abdomen and EMS reported that patient has not had significant cough or shortness of breath.  Vital signs and route were normal per EMS except for temperature of 101 by their thermometer.  The history is provided by the EMS personnel, a relative and medical records.  Altered Mental Status      Prior to Admission medications   Medication Sig Start Date End Date Taking? Authorizing Provider  acetaminophen  (TYLENOL ) 325 MG tablet Take 2 tablets (650 mg total) by mouth every 6 (six) hours as needed for mild pain (or Fever >/= 101). 03/10/22   Sheikh, Omair Latif, DO  amLODipine  (NORVASC ) 5 MG tablet Take 1 tablet (5 mg total) by mouth daily. 03/25/24 03/25/25  Von Bellis, MD  feeding supplement (ENSURE ENLIVE / ENSURE PLUS) LIQD Take 237 mLs by mouth 2 (two) times daily between meals. Patient taking differently: Take 237 mLs by mouth daily. 03/10/22   Sherrill Cable Latif, DO  guaiFENesin -dextromethorphan (ROBITUSSIN DM) 100-10 MG/5ML syrup Take 10 mLs by mouth every 6 (six) hours as needed for cough. 03/24/24   Von Bellis, MD  latanoprost  (XALATAN ) 0.005 % ophthalmic  solution Place 1 drop into both eyes at bedtime. 09/28/21   [provider]  Multiple Vitamins-Minerals (MULTIVITAMIN WITH MINERALS) tablet Take 1 tablet by mouth daily.     [provider]  pantoprazole  (PROTONIX ) 40 MG tablet Take 1 tablet (40 mg total) by mouth daily for 7 days. 03/24/24 03/31/24  Von Bellis, MD  rivaroxaban  (XARELTO ) 20 MG TABS tablet Take 20 mg by mouth in the morning.    [provider]    Allergies: Aspirin     Review of Systems  Updated Vital Signs BP (!) 131/47   Pulse 70   Temp (!) 104.2 F (40.1 C) (Rectal)   Resp 17   SpO2 100%   Physical Exam Vitals and nursing note reviewed.  Constitutional:      General: He is not in acute distress.    Appearance: He is well-developed.  HENT:     Head: Normocephalic and atraumatic.     Mouth/Throat:     Mouth: Mucous membranes are dry.  Eyes:     Conjunctiva/sclera: Conjunctivae normal.     Pupils: Pupils are equal, round, and reactive to light.  Cardiovascular:     Rate and Rhythm: Normal rate and regular rhythm.     Heart sounds: No murmur heard. Pulmonary:     Effort: Pulmonary effort is normal. No respiratory distress.     Breath sounds: Normal breath sounds. No wheezing or rales.  Abdominal:     General: There is no distension.  Palpations: Abdomen is soft.     Tenderness: There is no abdominal tenderness. There is no right CVA tenderness, left CVA tenderness, guarding or rebound.  Musculoskeletal:        General: No tenderness. Normal range of motion.     Cervical back: Normal range of motion and neck supple.     Right lower leg: No edema.     Left lower leg: No edema.  Skin:    General: Skin is warm and dry.     Findings: No erythema or rash.  Neurological:     Mental Status: He is alert.     Comments: Awake and alert.  Able to answer yes/no questions.  Able to follow simple commands  Psychiatric:        Behavior: Behavior normal.     (all labs ordered are  listed, but only abnormal results are displayed) Labs Reviewed  RESP PANEL BY RT-PCR (RSV, FLU A&B, COVID)  [id number] - Abnormal; Notable for the following components:      Result Value   SARS Coronavirus 2 by RT PCR POSITIVE (*)    All other components within normal limits  CBC WITH DIFFERENTIAL/PLATELET - Abnormal; Notable for the following components:   WBC 16.0 (*)    RBC 4.06 (*)    Hemoglobin 11.6 (*)    HCT 37.5 (*)    Neutro Abs 14.0 (*)    Abs Immature Granulocytes 0.10 (*)    All other components within normal limits  PROTIME-INR - Abnormal; Notable for the following components:   Prothrombin Time 56.6 (*)    INR 6.1 (*)    All other components within normal limits  URINALYSIS, W/ REFLEX TO CULTURE (INFECTION SUSPECTED) - Abnormal; Notable for the following components:   APPearance CLOUDY (*)    Hgb urine dipstick MODERATE (*)    Protein, ur 30 (*)    Nitrite POSITIVE (*)    Leukocytes,Ua LARGE (*)    Bacteria, UA MANY (*)    All other components within normal limits  COMPREHENSIVE METABOLIC PANEL WITH GFR - Abnormal; Notable for the following components:   Sodium 134 (*)    Glucose, Bld 101 (*)    BUN 29 (*)    Creatinine, Ser 1.80 (*)    Albumin 2.8 (*)    Total Bilirubin 1.4 (*)    GFR, Estimated 36 (*)    All other components within normal limits  I-STAT CG4 LACTIC ACID, ED - Abnormal; Notable for the following components:   Lactic Acid, Venous 3.6 (*)    All other components within normal limits  I-STAT CG4 LACTIC ACID, ED - Abnormal; Notable for the following components:   Lactic Acid, Venous 2.4 (*)    All other components within normal limits  CULTURE, BLOOD (ROUTINE X 2)  CULTURE, BLOOD (ROUTINE X 2)  URINE CULTURE    EKG: EKG Interpretation Date/Time:  Saturday April 07 2024 16:36:35 EDT Ventricular Rate:  77 PR Interval:  172 QRS Duration:  95 QT Interval:  371 QTC Calculation: 420 R Axis:   -59  Text Interpretation: Sinus rhythm Left  anterior fascicular block Anterior infarct, old No significant change since last tracing Confirmed by Doretha Folks ([id number]) on 04/07/2024 5:51:52 PM  Radiology: [facility] Chest Port 1 View Result Date: 04/07/2024 CLINICAL DATA:  Sepsis. EXAM: PORTABLE CHEST 1 VIEW COMPARISON:  03/19/2024 FINDINGS: Stable cardiomegaly. Mild scarring seen in both lung bases. No evidence of acute infiltrate or pleural effusion. IMPRESSION: Stable cardiomegaly and bibasilar  scarring. No acute findings. Electronically Signed   By: Norleen DELENA Kil M.D.   On: 04/07/2024 17:36     Procedures   Medications Ordered in the ED  lactated ringers  infusion ( Intravenous New Bag/Given 04/07/24 1808)  lactated ringers  bolus 500 mL (0 mLs Intravenous Stopped 04/07/24 1806)  acetaminophen  (TYLENOL ) suppository 650 mg (650 mg Rectal Given 04/07/24 1844)  ceFEPIme  (MAXIPIME ) 2 g in sodium chloride  0.9 % 100 mL IVPB (0 g Intravenous Stopped 04/07/24 1900)  metroNIDAZOLE  (FLAGYL ) IVPB 500 mg (0 mg Intravenous Stopped 04/07/24 1949)                                    Medical Decision Making Amount and/or Complexity of Data Reviewed Independent Historian: caregiver and EMS External Data Reviewed: notes. Labs: ordered. Decision-making details documented in ED Course. Radiology: ordered and independent interpretation performed. Decision-making details documented in ED Course. ECG/medicine tests: ordered and independent interpretation performed. Decision-making details documented in ED Course.  Risk OTC drugs. Prescription drug management. Decision regarding hospitalization.   Pt with multiple medical problems and comorbidities and presenting today with a complaint that caries a high risk for morbidity and mortality.  Here today for the above complaints.  Concern for SIRS.  Possibility for recurrent pneumonia, pyelonephritis due to possible obstruction.  Lower suspicion for abdominal abscess, cholecystitis.  No findings to suggest  cellulitis.  Patient was given Tylenol  and IV fluids.  Labs are pending. I independently interpreted patient's labs and lactic acid is 3.6, patient is still testing positive for COVID but negative for flu and he has already been treated with remdesivir , CBC with leukocytosis of 16 and stable hemoglobin of 11.6, INR today elevated at 6.1 and patient is on Xarelto .  Still following up on urine and CMP. I have independently visualized and interpreted pt's images today.  Chest x-ray without acute findings today.  Patient was covered with Rocephin  and Flagyl  due to concern for possible abdominal source.  CMP returned with new AKI with creatinine of 1.8 from his baseline of 1.4, UA with concern for UTI with positive nitrites, large leukocytes greater than 50 white cells and many bacteria.  Repeat lactate is improved at 2.4.  Will admit patient for further care.  Discussed this with the patient and his daughter and they are comfortable with this plan.  Consulted hospitalist for admission      Final diagnoses:  Sepsis with acute renal failure without septic shock, due to unspecified organism, unspecified acute renal failure type [facility])  Acute cystitis with hematuria    ED Discharge Orders     None          Doretha Folks, MD 04/07/24 2100

## 2024-04-07 NOTE — Progress Notes (Signed)
   04/07/24 2238  Assess: MEWS Score  Temp (!) 103.2 F (39.6 C)  BP (!) 144/62  MAP (mmHg) 79  Pulse Rate 91  Level of Consciousness Alert  SpO2 98 %  O2 Device Room Air  Assess: MEWS Score  MEWS Temp 2  MEWS Systolic 0  MEWS Pulse 0  MEWS RR 0  MEWS LOC 0  MEWS Score 2  MEWS Score Color Yellow  Assess: if the MEWS score is Yellow or Red  Were vital signs accurate and taken at a resting state? Yes  Does the patient meet 2 or more of the SIRS criteria? Yes  Does the patient have a confirmed or suspected source of infection? Yes  MEWS guidelines implemented  Yes, yellow  Treat  MEWS Interventions Considered administering scheduled or prn medications/treatments as ordered  Take Vital Signs  Increase Vital Sign Frequency  Yellow: Q2hr x1, continue Q4hrs until patient remains green for 12hrs  Escalate  MEWS: Escalate Yellow: Discuss with charge nurse and consider notifying provider and/or RRT  Notify: Charge Nurse/RN  Name of Charge Nurse/RN Notified Shiwangi, RN  Assess: SIRS CRITERIA  SIRS Temperature  1  SIRS Respirations  0  SIRS Pulse 1  SIRS WBC 1  SIRS Score Sum  3

## 2024-04-07 NOTE — ED Notes (Signed)
 PT 56.6,  INR   6.1     critical called from lab

## 2024-04-07 NOTE — ED Triage Notes (Signed)
 Pt BIB [facility] from home with c/o AMS. Family reports pt has had dark urine, shaking and  not acting himself since yesterday. D/c from hospital 2 weeks ago with pneumonia/Covid    102.3 temp 110/58 CBG 104

## 2024-04-07 NOTE — Progress Notes (Signed)
 Elink following for sepsis protocol.

## 2024-04-07 NOTE — H&P (Addendum)
 History and Physical    Favio Moder FMW:[id number] DOB: 1934/03/25 DOA: 04/07/2024  PCP: Larnell Hamilton, MD   Patient coming from: Home   Chief Complaint: Lethargy, shaking chills, loss of appetite   HPI: Connor Morris is a 89 y.o. male with medical history significant for hypertension, history of DVT and PE on Xarelto , bladder cancer status post TURBT, and dementia who presents for evaluation of lethargy, shaking chills, and loss of appetite.  Patient was too generally weak and fatigued to participate with home PT yesterday.  He became progressively lethargic at home, was noted to be febrile, and developed shaking chills.    ED Course: Upon arrival to the ED, patient is found to be febrile to 40.1 C and saturating well on room air with normal HR and stable BP.  Labs are most notable for creatinine 1.80, albumin 2.8, WBC 16,000, lactic acid 3.6, and INR 6.1.  UA notable for bacteriuria, pyuria, and positive nitrites.  Chest x-ray is negative for acute findings.  Blood and urine cultures were collected and the patient was given 500 mL of LR, acetaminophen , cefepime , and Flagyl .  Review of Systems:  ROS limited by patient's clinical condition.  Past Medical History:  Diagnosis Date   1st degree AV block    AAA (abdominal aortic aneurysm) (HCC) 03/2018   followed by dr lucas--- last CTA  4.6cm   Anticoagulated 03/2018   xarelto  for hx pe/ dvt--- managed by pcp   Arthritis    Cancer (HCC)    Emphysema lung (HCC)    Hiatal hernia    History of bladder cancer    urologist--- dr rosalind   s/p TURBT feb and march 2021   History of DVT of lower extremity 03/2018   LLE   History of prostate cancer 2002   s/p radioactive seed implants 04-11-2001   History of pulmonary embolus (PE) 03/2018   left side--  on xarelto    History of sepsis 01/30/2020   hospital admission in epic;  due to UTI   Impaired memory    Retained ureteral stent    right side   Sinus bradycardia 2018    cardiologist--- dr calhoun   Urgency of urination     Past Surgical History:  Procedure Laterality Date   CATARACT EXTRACTION W/ INTRAOCULAR LENS IMPLANT Right 2020   CYSTOSCOPY N/A 10/30/2019   Procedure: CYSTOSCOPY;  Surgeon: Sherrilee Belvie CROME, MD;  Location: [facility];  Service: Urology;  Laterality: N/A;   CYSTOSCOPY W/ RETROGRADES Bilateral 09/20/2019   Procedure: CYSTOSCOPY WITH RETROGRADE PYELOGRAM;  Surgeon: Sherrilee Belvie CROME, MD;  Location: [facility];  Service: Urology;  Laterality: Bilateral;   CYSTOSCOPY W/ URETERAL STENT REMOVAL N/A 03/25/2020   Procedure: CYSTOSCOPY WITH  REMOVAL OF RETAINED FOLEY CATHETER,  FULGERATION;  Surgeon: rosalind Zachary NOVAK, MD;  Location: [facility];  Service: Urology;  Laterality: N/A;   FOOT SURGERY  yrs ago   RADIOACTIVE PROSTATE SEED IMPLANTS  04-11-2001  dr aleene  @[email or web address]    TRANSURETHRAL RESECTION OF BLADDER TUMOR N/A 09/20/2019   Procedure: TRANSURETHRAL RESECTION OF BLADDER TUMOR (TURBT);  Surgeon: Sherrilee Belvie CROME, MD;  Location: [facility];  Service: Urology;  Laterality: N/A;  1 HR   TRANSURETHRAL RESECTION OF BLADDER TUMOR N/A 10/30/2019   Procedure: TRANSURETHRAL RESECTION OF BLADDER TUMOR (TURBT), REMOVAL OF RIGHT STENT;  Surgeon: Sherrilee Belvie CROME, MD;  Location: [facility];  Service: Urology;  Laterality: N/A;    Social  History:   reports that he quit smoking about 38 years ago. His smoking use included cigarettes. He started smoking about 58 years ago. He has a 20 pack-year smoking history. He has never used smokeless tobacco. He reports that he does not drink alcohol  and does not use drugs.  Allergies  Allergen Reactions   Aspirin      Unknown reaction    Family History  Problem Relation Age of Onset   Diabetes Sister    Hypertension Mother        family history   Healthy Father    Colon cancer Neg Hx      Prior to Admission medications    Medication Sig Start Date End Date Taking? Authorizing Provider  acetaminophen  (TYLENOL ) 325 MG tablet Take 2 tablets (650 mg total) by mouth every 6 (six) hours as needed for mild pain (or Fever >/= 101). 03/10/22  Yes Sheikh, Omair Latif, DO  feeding supplement (ENSURE ENLIVE / ENSURE PLUS) LIQD Take 237 mLs by mouth 2 (two) times daily between meals. Patient taking differently: Take 237 mLs by mouth daily. 03/10/22  Yes Sheikh, Omair Latif, DO  latanoprost  (XALATAN ) 0.005 % ophthalmic solution Place 1 drop into both eyes at bedtime. 09/28/21  Yes [provider]  meclizine (ANTIVERT) 25 MG tablet Take 25 mg by mouth 2 (two) times daily as needed for dizziness. 04/06/24  Yes [provider]  Multiple Vitamins-Minerals (MULTIVITAMIN WITH MINERALS) tablet Take 1 tablet by mouth daily.    Yes [provider]  rivaroxaban  (XARELTO ) 20 MG TABS tablet Take 20 mg by mouth in the morning.   Yes [provider]  amLODipine  (NORVASC ) 5 MG tablet Take 1 tablet (5 mg total) by mouth daily. Patient not taking: Reported on 04/07/2024 03/25/24 03/25/25  Von Bellis, MD  guaiFENesin -dextromethorphan (ROBITUSSIN DM) 100-10 MG/5ML syrup Take 10 mLs by mouth every 6 (six) hours as needed for cough. Patient not taking: Reported on 04/07/2024 03/24/24   Von Bellis, MD  pantoprazole  (PROTONIX ) 40 MG tablet Take 1 tablet (40 mg total) by mouth daily for 7 days. 03/24/24 03/31/24  Von Bellis, MD    Physical Exam: Vitals:   04/07/24 2045 04/07/24 2100 04/07/24 2115 04/07/24 2117  BP: (!) 139/97 (!) 155/95 (!) 155/99   Pulse:  (!) 50 79   Resp: (!) 21 (!) 25 (!) 25   Temp:    (!) 103 F (39.4 C)  TempSrc:    Axillary  SpO2:  100% 96%     Constitutional: NAD, frail-appearing  Eyes: PERTLA, lids and conjunctivae normal ENMT: Mucous membranes are moist. Posterior pharynx clear of any exudate or lesions.   Neck: supple, no masses  Respiratory: no wheezing, no crackles. No  accessory muscle use.  Cardiovascular: S1 & S2 heard, regular rate and rhythm. No extremity edema.  Abdomen: Soft, generally tender. Bowel sounds active.  Musculoskeletal: no clubbing / cyanosis. No joint deformity upper and lower extremities.   Skin: no significant rashes, lesions, ulcers. Warm, dry, well-perfused. Neurologic: CN 2-12 grossly intact. Moving all extremities. Sleeping, wakes to voice and oriented to person only.     Labs and Imaging on Admission: I have personally reviewed following labs and imaging studies  CBC: Recent Labs  Lab 04/07/24 1638  WBC 16.0*  NEUTROABS 14.0*  HGB 11.6*  HCT 37.5*  MCV 92.4  PLT 216   Basic Metabolic Panel: Recent Labs  Lab 04/07/24 1933  NA 134*  K 5.1  CL 102  CO2 23  GLUCOSE 101*  BUN 29*  CREATININE 1.80*  CALCIUM 9.5   GFR: CrCl cannot be calculated (Unknown ideal weight.). Liver Function Tests: Recent Labs  Lab 04/07/24 1933  AST 20  ALT 12  ALKPHOS 40  BILITOT 1.4*  PROT 7.1  ALBUMIN 2.8*   No results for input(s): LIPASE, AMYLASE in the last 168 hours. No results for input(s): AMMONIA in the last 168 hours. Coagulation Profile: Recent Labs  Lab 04/07/24 1638  INR 6.1*   Cardiac Enzymes: No results for input(s): CKTOTAL, CKMB, CKMBINDEX, TROPONINI in the last 168 hours. BNP (last 3 results) No results for input(s): PROBNP in the last 8760 hours. HbA1C: No results for input(s): HGBA1C in the last 72 hours. CBG: No results for input(s): GLUCAP in the last 168 hours. Lipid Profile: No results for input(s): CHOL, HDL, LDLCALC, TRIG, CHOLHDL, LDLDIRECT in the last 72 hours. Thyroid  Function Tests: No results for input(s): TSH, T4TOTAL, FREET4, T3FREE, THYROIDAB in the last 72 hours. Anemia Panel: No results for input(s): VITAMINB12, FOLATE, FERRITIN, TIBC, IRON, RETICCTPCT in the last 72 hours. Urine analysis:    Component Value Date/Time    COLORURINE YELLOW 04/07/2024 2038   APPEARANCEUR CLOUDY (A) 04/07/2024 2038   LABSPEC 1.013 04/07/2024 2038   PHURINE 7.0 04/07/2024 2038   GLUCOSEU NEGATIVE 04/07/2024 2038   HGBUR MODERATE (A) 04/07/2024 2038   BILIRUBINUR NEGATIVE 04/07/2024 2038   KETONESUR NEGATIVE 04/07/2024 2038   PROTEINUR 30 (A) 04/07/2024 2038   UROBILINOGEN 0.2 02/26/2019 1011   NITRITE POSITIVE (A) 04/07/2024 2038   LEUKOCYTESUR LARGE (A) 04/07/2024 2038   Sepsis Labs: @LABRCNTIP (procalcitonin:4,lacticidven:4) ) Recent Results (from the past 240 hours)  Resp panel by RT-PCR (RSV, Flu A&B, Covid) Anterior Nasal Swab     Status: Abnormal   Collection Time: 04/07/24  5:21 PM   Specimen: Anterior Nasal Swab  Result Value Ref Range Status   SARS Coronavirus 2 by RT PCR POSITIVE (A) NEGATIVE Final   Influenza A by PCR NEGATIVE NEGATIVE Final   Influenza B by PCR NEGATIVE NEGATIVE Final    Comment: (NOTE) The Xpert Xpress SARS-CoV-2/FLU/RSV plus assay is intended as an aid in the diagnosis of influenza from Nasopharyngeal swab specimens and should not be used as a sole basis for treatment. Nasal washings and aspirates are unacceptable for Xpert Xpress SARS-CoV-2/FLU/RSV testing.  Fact Sheet for Patients: BloggerCourse.com  Fact Sheet for Healthcare Providers: SeriousBroker.it  This test is not yet approved or cleared by the [place]  FDA and has been authorized for detection and/or diagnosis of SARS-CoV-2 by FDA under an Emergency Use Authorization (EUA). This EUA will remain in effect (meaning this test can be used) for the duration of the COVID-19 declaration under Section 564(b)(1) of the Act, 21 U.S.C. section 360bbb-3(b)(1), unless the authorization is terminated or revoked.     Resp Syncytial Virus by PCR NEGATIVE NEGATIVE Final    Comment: (NOTE) Fact Sheet for Patients: BloggerCourse.com  Fact Sheet for  Healthcare Providers: SeriousBroker.it  This test is not yet approved or cleared by the [place]  FDA and has been authorized for detection and/or diagnosis of SARS-CoV-2 by FDA under an Emergency Use Authorization (EUA). This EUA will remain in effect (meaning this test can be used) for the duration of the COVID-19 declaration under Section 564(b)(1) of the Act, 21 U.S.C. section 360bbb-3(b)(1), unless the authorization is terminated or revoked.  Performed at [facility], [address], [place], [place] [address]      Radiological Exams  on Admission: DG Chest Port 1 View Result Date: 04/07/2024 CLINICAL DATA:  Sepsis. EXAM: PORTABLE CHEST 1 VIEW COMPARISON:  03/19/2024 FINDINGS: Stable cardiomegaly. Mild scarring seen in both lung bases. No evidence of acute infiltrate or pleural effusion. IMPRESSION: Stable cardiomegaly and bibasilar scarring. No acute findings. Electronically Signed   By: Norleen DELENA Kil M.D.   On: 04/07/2024 17:36    EKG: Independently reviewed. Sinus rhythm, LAFB.   Assessment/Plan  1. Severe sepsis d/t UTI  - Febrile and tachypneic on arrival with leukocytosis, lactic acidosis, AKI, encephalopathy, and coagulopathy  - Complete 30 cc/kg IVF bolus, continue cefepime , trend lactate, follow cultures and clinical course    2. AKI superimposed on CKD 3A  - Check CT renal stone study, renally-dose medications, repeat chem panel in am    3. Coagulopathy  - INR 6.1 in ED with normal platelets and no overt bleeding  - Treat sepsis, check fibrinogen , repeat CBC and INR in am    4. Hx of DVT and PE  - Hold Xarelto  for now, repeat CBC and INR in am    5. Dementia  - Delirium precautions    6. Hypertension  - Hold Norvasc  for now in light of sepsis, resume if BP remains stable   7. Recent COVID - Patient tested positive for COVID on 8/5 after 2 days of sxs and was treated with remdesivir  and Decadron   - He continues to test  positive in ED but no longer requires isolation  8. Bladder cancer; ascending aortic aneurysm - Outpatient follow-up    DVT prophylaxis: SCDs  Code Status: DNR  Level of Care: Level of care: Progressive Family Communication: Daughter at bedside  Disposition Plan:  Patient is from: Home  Anticipated d/c is to: TBD Anticipated d/c date is: 04/11/24  Patient currently: Pending treatment of severe sepsis  Consults called: None  Admission status: Inpatient     Evalene GORMAN Sprinkles, MD [facility]  04/07/2024, 9:49 PM

## 2024-04-08 DIAGNOSIS — Z515 Encounter for palliative care: Secondary | ICD-10-CM | POA: Diagnosis not present

## 2024-04-08 DIAGNOSIS — A419 Sepsis, unspecified organism: Secondary | ICD-10-CM | POA: Diagnosis not present

## 2024-04-08 DIAGNOSIS — N390 Urinary tract infection, site not specified: Secondary | ICD-10-CM | POA: Diagnosis not present

## 2024-04-08 DIAGNOSIS — Z7189 Other specified counseling: Secondary | ICD-10-CM | POA: Diagnosis not present

## 2024-04-08 LAB — CBC
HCT: 28.9 % — ABNORMAL LOW (ref 39.0–52.0)
Hemoglobin: 9.1 g/dL — ABNORMAL LOW (ref 13.0–17.0)
MCH: 27.8 pg (ref 26.0–34.0)
MCHC: 31.5 g/dL (ref 30.0–36.0)
MCV: 88.4 fL (ref 80.0–100.0)
Platelets: 159 K/uL (ref 150–400)
RBC: 3.27 MIL/uL — ABNORMAL LOW (ref 4.22–5.81)
RDW: 14.1 % (ref 11.5–15.5)
WBC: 12.6 K/uL — ABNORMAL HIGH (ref 4.0–10.5)
nRBC: 0 % (ref 0.0–0.2)

## 2024-04-08 LAB — URINE CULTURE: Culture: <10000 — AB

## 2024-04-08 LAB — BLOOD CULTURE ID PANEL (REFLEXED) - BCID2

## 2024-04-08 LAB — PROTIME-INR
INR: 4.8 (ref 0.8–1.2)
Prothrombin Time: 47 s — ABNORMAL HIGH (ref 11.4–15.2)

## 2024-04-08 LAB — BASIC METABOLIC PANEL WITH GFR
Anion gap: 8 (ref 5–15)
BUN: 30 mg/dL — ABNORMAL HIGH (ref 8–23)
CO2: 22 mmol/L (ref 22–32)
Calcium: 8.8 mg/dL — ABNORMAL LOW (ref 8.9–10.3)
Chloride: 105 mmol/L (ref 98–111)
Creatinine, Ser: 1.63 mg/dL — ABNORMAL HIGH (ref 0.61–1.24)
GFR, Estimated: 40 mL/min — ABNORMAL LOW (ref >60)
Glucose, Bld: 104 mg/dL — ABNORMAL HIGH (ref 70–99)
Potassium: 4.5 mmol/L (ref 3.5–5.1)
Sodium: 135 mmol/L (ref 135–145)

## 2024-04-08 LAB — LACTIC ACID, PLASMA
Lactic Acid, Venous: 4.3 mmol/L (ref 0.5–1.9)
Lactic Acid, Venous: 1.7 mmol/L (ref 0.5–1.9)

## 2024-04-08 LAB — MRSA NEXT GEN BY PCR, NASAL: MRSA by PCR Next Gen: NOT DETECTED

## 2024-04-08 LAB — FIBRINOGEN: Fibrinogen: >800 mg/dL — ABNORMAL HIGH (ref 210–475)

## 2024-04-08 LAB — HEPATIC FUNCTION PANEL
ALT: 10 U/L (ref 0–44)
AST: 16 U/L (ref 15–41)
Albumin: 2.2 g/dL — ABNORMAL LOW (ref 3.5–5.0)
Alkaline Phosphatase: 34 U/L — ABNORMAL LOW (ref 38–126)
Bilirubin, Direct: 0.3 mg/dL — ABNORMAL HIGH (ref 0.0–0.2)
Indirect Bilirubin: 1.1 mg/dL — ABNORMAL HIGH (ref 0.3–0.9)
Total Bilirubin: 1.4 mg/dL — ABNORMAL HIGH (ref 0.0–1.2)
Total Protein: 5.7 g/dL — ABNORMAL LOW (ref 6.5–8.1)

## 2024-04-08 LAB — GLUCOSE, CAPILLARY: Glucose-Capillary: 89 mg/dL (ref 70–99)

## 2024-04-08 LAB — APTT: aPTT: 63 s — ABNORMAL HIGH (ref 24–36)

## 2024-04-08 MED ORDER — PANTOPRAZOLE SODIUM 40 MG IV SOLR
40.0000 mg | INTRAVENOUS | Status: DC
  Administered 2024-04-08 – 2024-04-10 (×2): 40 mg via INTRAVENOUS
  Filled 2024-04-08 (×2): qty 10

## 2024-04-08 MED ORDER — SODIUM CHLORIDE 0.9 % IV SOLN
2.0000 g | INTRAVENOUS | Status: DC
  Administered 2024-04-08 – 2024-04-09 (×2): 2 g via INTRAVENOUS
  Filled 2024-04-08 (×3): qty 20

## 2024-04-08 MED ORDER — SODIUM CHLORIDE 0.9 % IV SOLN: INTRAVENOUS | Status: DC

## 2024-04-08 NOTE — Plan of Care (Signed)
  Problem: Education: Goal: Knowledge of risk factors and measures for prevention of condition will improve Outcome: Not Progressing   Problem: Coping: Goal: Psychosocial and spiritual needs will be supported Outcome: Progressing   Problem: Respiratory: Goal: Will maintain a patent airway Outcome: Progressing Goal: Complications related to the disease process, condition or treatment will be avoided or minimized Outcome: Progressing   Problem: Fluid Volume: Goal: Hemodynamic stability will improve Outcome: Progressing   Problem: Clinical Measurements: Goal: Diagnostic test results will improve Outcome: Progressing Goal: Signs and symptoms of infection will decrease Outcome: Progressing   Problem: Respiratory: Goal: Ability to maintain adequate ventilation will improve Outcome: Progressing   Problem: Education: Goal: Knowledge of General Education information will improve Description: Including pain rating scale, medication(s)/side effects and non-pharmacologic comfort measures Outcome: Not Progressing   Problem: Health Behavior/Discharge Planning: Goal: Ability to manage health-related needs will improve Outcome: Progressing   Problem: Clinical Measurements: Goal: Ability to maintain clinical measurements within normal limits will improve Outcome: Progressing Goal: Will remain free from infection Outcome: Progressing Goal: Diagnostic test results will improve Outcome: Progressing Goal: Respiratory complications will improve Outcome: Progressing Goal: Cardiovascular complication will be avoided Outcome: Progressing   Problem: Activity: Goal: Risk for activity intolerance will decrease Outcome: Not Progressing   Problem: Nutrition: Goal: Adequate nutrition will be maintained Outcome: Not Progressing   Problem: Coping: Goal: Level of anxiety will decrease Outcome: Progressing   Problem: Elimination: Goal: Will not experience complications related to bowel  motility Outcome: Progressing Goal: Will not experience complications related to urinary retention Outcome: Progressing   Problem: Pain Managment: Goal: General experience of comfort will improve and/or be controlled Outcome: Progressing   Problem: Safety: Goal: Ability to remain free from injury will improve Outcome: Progressing   Problem: Skin Integrity: Goal: Risk for impaired skin integrity will decrease Outcome: Progressing

## 2024-04-08 NOTE — Plan of Care (Signed)
  Problem: Education: Goal: Knowledge of risk factors and measures for prevention of condition will improve Outcome: Progressing   Problem: Coping: Goal: Psychosocial and spiritual needs will be supported Outcome: Progressing   Problem: Respiratory: Goal: Will maintain a patent airway Outcome: Progressing   Problem: Fluid Volume: Goal: Hemodynamic stability will improve Outcome: Progressing

## 2024-04-08 NOTE — Plan of Care (Signed)
  Problem: Respiratory: Goal: Will maintain a patent airway Outcome: Progressing   Problem: Clinical Measurements: Goal: Diagnostic test results will improve Outcome: Progressing   Problem: Skin Integrity: Goal: Risk for impaired skin integrity will decrease Outcome: Progressing

## 2024-04-08 NOTE — Progress Notes (Addendum)
 PROGRESS NOTE    Connor Morris  FMW:[id number] DOB: Apr 26, 1934 DOA: 04/07/2024 PCP: Larnell Hamilton, MD   Brief Narrative:  89 y.o. male with medical history significant for hypertension, history of DVT and PE on Xarelto , bladder cancer status post TURBT, and dementia presented with lethargy, shaking chills and loss of appetite along with fever.  On presentation, he was febrile.  Creatinine was 1.8, WBC of 16,000, lactic acid of 3.6 and INR of 6.1.  UA was suggestive of UTI.  Chest x-ray negative for acute findings.  He was started on IV fluids and antibiotics.  Assessment & Plan:   Severe sepsis: Present on admission UTI: Present on admission E. coli bacteremia Lactic acidosis: Resolved -Presented with fever, tachypnea, leukocytosis, lactic acidosis, AKI, encephalopathy and coagulopathy with evidence of UTI -BCID Positive for E. coli.  Follow sensitivities.  Change antibiotics to Rocephin  alone. - Decrease normal saline to 100 cc an hour. - Tmax of 103.2 since admission.  AKI superimposed on CKD stage IIIa - Creatinine 1.8 on presentation.  Improving to 1.63.  IV fluids as above  Leukocytosis -Improving  Anemia of chronic disease -From chronic illnesses.  Hemoglobin is stable.  Monitor intermittently  Hyponatremia -Resolved  Hypoalbuminemia - From poor oral intake.  Encouraged oral intake.  History of DVT and PE Supratherapeutic INR - Xarelto  on hold for now.  INR 6.1 on presentation; improving to 4.8 today.  Monitor  Dementia -Delirium precautions.  Fall precautions.  PT eval  Hypertension --blood pressure intermittently on the lower side.  Antihypertensives on hold  GERD -Continue PPI  Recent COVID - Tested positive for COVID on 03/21/2019 2:05 days of symptoms and was treated with remdesivir  and Decadron .  Continues to test positive on presentation: No longer requires ambulation  History of bladder cancer Ascending aortic aneurysm -Outpatient  follow-up    DVT prophylaxis: SCDs Code Status: DNR Family Communication: None at bedside Disposition Plan: Status is: Inpatient Remains inpatient appropriate because: Of severity of illness.  Need for IV antibiotics    Consultants: None  Procedures: None  Antimicrobials:  Anti-infectives (From admission, onward)    Start     Dose/Rate Route Frequency Ordered Stop   04/08/24 1000  cefTRIAXone  (ROCEPHIN ) 2 g in sodium chloride  0.9 % 100 mL IVPB        2 g 200 mL/hr over 30 Minutes Intravenous Every 24 hours 04/08/24 0722     04/08/24 0600  ceFEPIme  (MAXIPIME ) 2 g in sodium chloride  0.9 % 100 mL IVPB  Status:  Discontinued        2 g 200 mL/hr over 30 Minutes Intravenous Every 12 hours 04/07/24 2129 04/08/24 0722   04/07/24 1800  ceFEPIme  (MAXIPIME ) 2 g in sodium chloride  0.9 % 100 mL IVPB        2 g 200 mL/hr over 30 Minutes Intravenous  Once 04/07/24 1752 04/07/24 1900   04/07/24 1800  metroNIDAZOLE  (FLAGYL ) IVPB 500 mg        500 mg 100 mL/hr over 60 Minutes Intravenous  Once 04/07/24 1752 04/07/24 1949        Subjective: Patient seen and examined at bedside.  Had fever overnight.  No seizures, vomiting or abdominal pain reported.  Objective: Vitals:   04/08/24 0400 04/08/24 0500 04/08/24 0600 04/08/24 0714  BP: (!) 110/49 (!) 108/45 (!) 142/48 (!) 144/40  Pulse: (!) 53 (!) 53 (!) 52 (!) 56  Resp: 16 14 18 17   Temp:    97.6 F (36.4 C)  TempSrc:  Oral  SpO2: 100% 100% 99% 100%  Weight:  71.2 kg    Height:        Intake/Output Summary (Last 24 hours) at 04/08/2024 0739 Last data filed at 04/08/2024 0455 Gross per 24 hour  Intake 2861.48 ml  Output --  Net 2861.48 ml   Filed Weights   04/07/24 2238 04/08/24 0500  Weight: 71.2 kg 71.2 kg    Examination:  General exam: Appears calm and comfortable.  Elderly male lying in bed. Respiratory system: Bilateral decreased breath sounds at bases, scattered crackles Cardiovascular system: S1 & S2 heard,  bradycardic intermittently gastrointestinal system: Abdomen is nondistended, soft and nontender. Normal bowel sounds heard. Extremities: No cyanosis, clubbing, edema  Central nervous system: Sleepy, wakes up only very slightly, extremely slow to respond, poor historian.  No focal neurological deficits. Moving extremities Skin: No rashes, lesions or ulcers Psychiatry: Flat affect.  Not agitated.    Data Reviewed: I have personally reviewed following labs and imaging studies  CBC: Recent Labs  Lab 04/07/24 1638 04/08/24 0322  WBC 16.0* 12.6*  NEUTROABS 14.0*  --   HGB 11.6* 9.1*  HCT 37.5* 28.9*  MCV 92.4 88.4  PLT 216 159   Basic Metabolic Panel: Recent Labs  Lab 04/07/24 1933 04/08/24 0322  NA 134* 135  K 5.1 4.5  CL 102 105  CO2 23 22  GLUCOSE 101* 104*  BUN 29* 30*  CREATININE 1.80* 1.63*  CALCIUM 9.5 8.8*   GFR: Estimated Creatinine Clearance: 31.3 mL/min (A) (by C-G formula based on SCr of 1.63 mg/dL (H)). Liver Function Tests: Recent Labs  Lab 04/07/24 1933 04/08/24 0322  AST 20 16  ALT 12 10  ALKPHOS 40 34*  BILITOT 1.4* 1.4*  PROT 7.1 5.7*  ALBUMIN 2.8* 2.2*   No results for input(s): LIPASE, AMYLASE in the last 168 hours. No results for input(s): AMMONIA in the last 168 hours. Coagulation Profile: Recent Labs  Lab 04/07/24 1638 04/08/24 0322  INR 6.1* 4.8*   Cardiac Enzymes: No results for input(s): CKTOTAL, CKMB, CKMBINDEX, TROPONINI in the last 168 hours. BNP (last 3 results) No results for input(s): PROBNP in the last 8760 hours. HbA1C: No results for input(s): HGBA1C in the last 72 hours. CBG: No results for input(s): GLUCAP in the last 168 hours. Lipid Profile: No results for input(s): CHOL, HDL, LDLCALC, TRIG, CHOLHDL, LDLDIRECT in the last 72 hours. Thyroid  Function Tests: No results for input(s): TSH, T4TOTAL, FREET4, T3FREE, THYROIDAB in the last 72 hours. Anemia Panel: No results for  input(s): VITAMINB12, FOLATE, FERRITIN, TIBC, IRON, RETICCTPCT in the last 72 hours. Sepsis Labs: Recent Labs  Lab 04/07/24 1733 04/07/24 1938 04/07/24 2304 04/08/24 0322  LATICACIDVEN 3.6* 2.4* 4.3* 1.7    Recent Results (from the past 240 hours)  Blood Culture (routine x 2)     Status: None (Preliminary result)   Collection Time: 04/07/24  5:00 PM   Specimen: BLOOD RIGHT HAND  Result Value Ref Range Status   Specimen Description BLOOD RIGHT HAND  Final   Special Requests   Final    BOTTLES DRAWN AEROBIC AND ANAEROBIC Blood Culture adequate volume   Culture  Setup Time   Final    GRAM NEGATIVE RODS IN BOTH AEROBIC AND ANAEROBIC BOTTLES Organism ID to follow CRITICAL RESULT CALLED TO, READ BACK BY AND VERIFIED WITH: PHARMD J LEDFORD 04/08/2024 @ 0701 BY AB Performed at [facility], [address], [place], [place] [address]    Culture VONNE  NEGATIVE RODS  Final   Report Status PENDING  Incomplete  Blood Culture ID Panel (Reflexed)     Status: Abnormal   Collection Time: 04/07/24  5:00 PM  Result Value Ref Range Status   Enterococcus faecalis NOT DETECTED NOT DETECTED Final   Enterococcus Faecium NOT DETECTED NOT DETECTED Final   Listeria monocytogenes NOT DETECTED NOT DETECTED Final   Staphylococcus species NOT DETECTED NOT DETECTED Final   Staphylococcus aureus (BCID) NOT DETECTED NOT DETECTED Final   Staphylococcus epidermidis NOT DETECTED NOT DETECTED Final   Staphylococcus lugdunensis NOT DETECTED NOT DETECTED Final   Streptococcus species NOT DETECTED NOT DETECTED Final   Streptococcus agalactiae NOT DETECTED NOT DETECTED Final   Streptococcus pneumoniae NOT DETECTED NOT DETECTED Final   Streptococcus pyogenes NOT DETECTED NOT DETECTED Final   A.calcoaceticus-baumannii NOT DETECTED NOT DETECTED Final   Bacteroides fragilis NOT DETECTED NOT DETECTED Final   Enterobacterales DETECTED (A) NOT DETECTED Final    Comment: Enterobacterales represent a  large order of gram negative bacteria, not a single organism. CRITICAL RESULT CALLED TO, READ BACK BY AND VERIFIED WITH: PHARMD J LEDFORD 04/08/2024 @ 0701 BY AB    Enterobacter cloacae complex NOT DETECTED NOT DETECTED Final   Escherichia coli DETECTED (A) NOT DETECTED Final    Comment: CRITICAL RESULT CALLED TO, READ BACK BY AND VERIFIED WITH: PHARMD J LEDFORD 04/08/2024 @ 0701 BY AB    Klebsiella aerogenes NOT DETECTED NOT DETECTED Final   Klebsiella oxytoca NOT DETECTED NOT DETECTED Final   Klebsiella pneumoniae NOT DETECTED NOT DETECTED Final   Proteus species NOT DETECTED NOT DETECTED Final   Salmonella species NOT DETECTED NOT DETECTED Final   Serratia marcescens NOT DETECTED NOT DETECTED Final   Haemophilus influenzae NOT DETECTED NOT DETECTED Final   Neisseria meningitidis NOT DETECTED NOT DETECTED Final   Pseudomonas aeruginosa NOT DETECTED NOT DETECTED Final   Stenotrophomonas maltophilia NOT DETECTED NOT DETECTED Final   Candida albicans NOT DETECTED NOT DETECTED Final   Candida auris NOT DETECTED NOT DETECTED Final   Candida glabrata NOT DETECTED NOT DETECTED Final   Candida krusei NOT DETECTED NOT DETECTED Final   Candida parapsilosis NOT DETECTED NOT DETECTED Final   Candida tropicalis NOT DETECTED NOT DETECTED Final   Cryptococcus neoformans/gattii NOT DETECTED NOT DETECTED Final   CTX-M ESBL NOT DETECTED NOT DETECTED Final   Carbapenem resistance IMP NOT DETECTED NOT DETECTED Final   Carbapenem resistance KPC NOT DETECTED NOT DETECTED Final   Carbapenem resistance NDM NOT DETECTED NOT DETECTED Final   Carbapenem resist OXA 48 LIKE NOT DETECTED NOT DETECTED Final   Carbapenem resistance VIM NOT DETECTED NOT DETECTED Final    Comment: Performed at [facility], [address], [place], [place] [address]  Blood Culture (routine x 2)     Status: None (Preliminary result)   Collection Time: 04/07/24  5:14 PM   Specimen: BLOOD RIGHT FOREARM  Result Value Ref Range  Status   Specimen Description BLOOD RIGHT FOREARM  Final   Special Requests   Final    BOTTLES DRAWN AEROBIC ONLY Blood Culture results may not be optimal due to an inadequate volume of blood received in culture bottles   Culture  Setup Time   Final    GRAM NEGATIVE RODS AEROBIC BOTTLE ONLY CRITICAL VALUE NOTED.  VALUE IS CONSISTENT WITH PREVIOUSLY REPORTED AND CALLED VALUE. Performed at [facility], [address], [place], [place] [address]    Culture GRAM NEGATIVE RODS  Final   Report Status PENDING  Incomplete  Resp panel by RT-PCR (RSV, Flu A&B, Covid) Anterior Nasal Swab     Status: Abnormal   Collection Time: 04/07/24  5:21 PM   Specimen: Anterior Nasal Swab  Result Value Ref Range Status   SARS Coronavirus 2 by RT PCR POSITIVE (A) NEGATIVE Final   Influenza A by PCR NEGATIVE NEGATIVE Final   Influenza B by PCR NEGATIVE NEGATIVE Final    Comment: (NOTE) The Xpert Xpress SARS-CoV-2/FLU/RSV plus assay is intended as an aid in the diagnosis of influenza from Nasopharyngeal swab specimens and should not be used as a sole basis for treatment. Nasal washings and aspirates are unacceptable for Xpert Xpress SARS-CoV-2/FLU/RSV testing.  Fact Sheet for Patients: BloggerCourse.com  Fact Sheet for Healthcare Providers: SeriousBroker.it  This test is not yet approved or cleared by the [place]  FDA and has been authorized for detection and/or diagnosis of SARS-CoV-2 by FDA under an Emergency Use Authorization (EUA). This EUA will remain in effect (meaning this test can be used) for the duration of the COVID-19 declaration under Section 564(b)(1) of the Act, 21 U.S.C. section 360bbb-3(b)(1), unless the authorization is terminated or revoked.     Resp Syncytial Virus by PCR NEGATIVE NEGATIVE Final    Comment: (NOTE) Fact Sheet for Patients: BloggerCourse.com  Fact Sheet for Healthcare  Providers: SeriousBroker.it  This test is not yet approved or cleared by the [place]  FDA and has been authorized for detection and/or diagnosis of SARS-CoV-2 by FDA under an Emergency Use Authorization (EUA). This EUA will remain in effect (meaning this test can be used) for the duration of the COVID-19 declaration under Section 564(b)(1) of the Act, 21 U.S.C. section 360bbb-3(b)(1), unless the authorization is terminated or revoked.  Performed at [facility], [address] [place], [place] [address]   MRSA Next Gen by PCR, Nasal     Status: None   Collection Time: 04/07/24 11:14 PM   Specimen: Nasal Mucosa; Nasal Swab  Result Value Ref Range Status   MRSA by PCR Next Gen NOT DETECTED NOT DETECTED Final    Comment: (NOTE) The GeneXpert MRSA Assay (FDA approved for NASAL specimens only), is one component of a comprehensive MRSA colonization surveillance program. It is not intended to diagnose MRSA infection nor to guide or monitor treatment for MRSA infections. Test performance is not FDA approved in patients less than 72 years old. Performed at [facility], [address], [place], [place] [address]          Radiology Studies: CT RENAL STONE STUDY Result Date: 04/07/2024 CLINICAL DATA:  Abdominal/flank pain.  Stone suspected. EXAM: CT ABDOMEN AND PELVIS WITHOUT CONTRAST TECHNIQUE: Multidetector CT imaging of the abdomen and pelvis was performed following the standard protocol without IV contrast. RADIATION DOSE REDUCTION: This exam was performed according to the departmental dose-optimization program which includes automated exposure control, adjustment of the mA and/or kV according to patient size and/or use of iterative reconstruction technique. COMPARISON:  CT without contrast 03/23/2024, PET-CT 01/29/2022. FINDINGS: Lower chest: Lung bases are emphysematous but clear. Small hiatal hernia. The heart is slightly enlarged. There are  scattered three-vessel coronary calcifications. The aortic valve leaflets are heavily calcified. Interval resolved prior trace right pleural effusion. Hepatobiliary: Respiratory motion obscures fine detail. There is no obvious liver mass. Unremarkable gallbladder bile ducts, as visualized. Pancreas: No focal abnormality is seen through the motion artifact. Spleen: No focal abnormality is seen through the motion artifact. Adrenals/Urinary  Tract: No adrenal mass. There are bilateral Bosniak 1 renal cysts, more on the left, largest of these is 4.7 cm and 0.9 Hounsfield units left upper pole, on the right the largest is 2.6 cm and 17 Hounsfield units in the superior pole. No follow-up imaging is recommended. There is asymmetric increased right perinephric stranding, small amount of fluid now seen collecting in the lower posterior right perirenal space. Mild hydroureteronephrosis continues to be seen on the right to the bladder base, without obstructing stone identified. No intrarenal stones are seen bilaterally. No nephrolithiasis was seen on either prior study. Findings could indicate evidence of an ascending right-sided UTI with pyelonephritis, sequela of a recently passed stone, or a distal right ureteral stricture or neoplasm causing obstructive uropathy. The hydronephrosis has slightly increased since August 8. Again noted is mild bladder thickening versus underdistention. Correlate clinically for underlying cystitis. Stomach/Bowel: No dilatation or overt wall thickening. Again the cecum and appendix are contained in a moderate-sized right inguinal hernia sac through a defect of 2.9 cm on 3:64. No upstream obstruction is seen. The ileocecal junction is also within the hernia sac. There is mild-to-moderate fecal stasis in the distal 2/3 of the colon, increased retained stool in the rectum. No evidence of focal colitis or diverticulitis. Vascular/Lymphatic: Aortic atherosclerosis. No enlarged abdominal or pelvic  lymph nodes. Reproductive: Prior prostate brachytherapy. No mass is seen in the prostate bed. Both testicles are in the scrotal sac. Other: Addition to a right inguinal hernia there is a small left inguinal fat hernia and a small umbilical fat hernia. There are no findings of hernia incarceration, no wall thickening in the cecum and terminal ileum within the hernia sac. There is trace posterior deep pelvic ascites. No free hemorrhage, free air or abscess. Musculoskeletal: Osteopenia and degenerative change lumbar spine with bilateral SI joint ankylosis. Chronic compression fractures again noted L1 and 2. There are bridging syndesmophytes in the lower thoracic spine. Does the patient have ankylosing spondylitis? No acute or other significant osseous findings. IMPRESSION: 1. Increased right hydroureteronephrosis to the bladder base, without obstructing stone identified. Findings could indicate evidence of an ascending right-sided UTI with pyelonephritis, sequela of a recently passed stone, or a distal right ureteral stricture or neoplasm causing obstructive uropathy. 2. No urinary stone was seen on the current or prior studies. 3. Asymmetric increased right perinephric stranding and small amount of fluid in the lower posterior right perirenal space. 4. Cystitis versus bladder nondistention. 5. Constipation and diverticulosis. 6. Right inguinal hernia containing the cecum, ileocecal junction and appendix, without evidence of incarceration or up strain obstruction. 7. Aortic and coronary artery atherosclerosis. 8. Emphysema. 9. Osteopenia and degenerative change with chronic compression fractures L1 and L2. 10. Bridging syndesmophytes in the lower thoracic spine and bilateral SI joint ankylosis. Does the patient have ankylosing spondylitis? Aortic Atherosclerosis (ICD10-I70.0) and Emphysema (ICD10-J43.9). Electronically Signed   By: Francis Quam M.D.   On: 04/07/2024 22:43   DG Chest Port 1 View Result Date:  04/07/2024 CLINICAL DATA:  Sepsis. EXAM: PORTABLE CHEST 1 VIEW COMPARISON:  03/19/2024 FINDINGS: Stable cardiomegaly. Mild scarring seen in both lung bases. No evidence of acute infiltrate or pleural effusion. IMPRESSION: Stable cardiomegaly and bibasilar scarring. No acute findings. Electronically Signed   By: Norleen DELENA Kil M.D.   On: 04/07/2024 17:36        Scheduled Meds:  latanoprost   1 drop Both Eyes QHS   pantoprazole   40 mg Oral Daily   sodium chloride  flush  3 mL  Intravenous Q12H   Continuous Infusions:  cefTRIAXone  (ROCEPHIN )  IV     lactated ringers  150 mL/hr at 04/08/24 0455          Sophie Mao, MD [facility] 04/08/2024, 7:39 AM

## 2024-04-08 NOTE — Consult Note (Signed)
 Palliative Care Consult Note                                  Date: 04/08/2024   Patient Name: Connor Morris  DOB: [birth year]  MRN: [id number]  Age / Sex: 89 y.o., male  PCP: Larnell Hamilton, MD Referring Physician: Cheryle Page, MD  Reason for Consultation: Establishing goals of care  HPI/Patient Profile: 89 y.o. male  with past medical history of sinus bradycardia, hyperlipidemia, abdominal ascending aortic aneurysm, history of DVT/PE on Xarelto , bladder and prostate cancer, dementia, chronic kidney disease, and chronic normocytic anemia admitted on 04/07/2024 with lethargy, shaking chills, loss of appetite, and fever.   On presentation, he was febrile. Creatinine was 1.8, WBC of 16,000, lactic acid of 3.6 and INR of 6.1. UA was suggestive of UTI. Chest x-ray negative for acute findings. He was started on IV fluids and antibiotics.   Past Medical History:  Diagnosis Date   1st degree AV block    AAA (abdominal aortic aneurysm) (HCC) 03/2018   followed by dr lucas--- last CTA  4.6cm   Anticoagulated 03/2018   xarelto  for hx pe/ dvt--- managed by pcp   Arthritis    Cancer (HCC)    Emphysema lung (HCC)    Hiatal hernia    History of bladder cancer    urologist--- dr rosalind   s/p TURBT feb and march 2021   History of DVT of lower extremity 03/2018   LLE   History of prostate cancer 2002   s/p radioactive seed implants 04-11-2001   History of pulmonary embolus (PE) 03/2018   left side--  on xarelto    History of sepsis 01/30/2020   hospital admission in epic;  due to UTI   Impaired memory    Retained ureteral stent    right side   Sinus bradycardia 2018   cardiologist--- dr calhoun   Urgency of urination     Subjective:   I have reviewed medical records including EPIC notes, labs and imaging, received updates from nursing and attending, assessed the patient and then met with his daughter Dominiq Fontaine to discuss diagnosis  prognosis, GOC, EOL wishes, disposition and options.  I introduced Palliative Medicine as specialized medical care for people living with serious illness. It focuses on providing relief from symptoms and stress of a serious illness. The goal is to improve quality of life for both the patient and the family.  Today's Discussion: Patient sitting up in bed in no apparent distress. SLP at bedside. Patient's daughter [name] at bedside. We discussed patient's chronic conditions, recent hospitalization for Covid, and acute hospitalization for sepsis. I shared my concern that the patient's reserve is small due to his recent hospitalization and my worry that he is at high risk for further decompensation.  The patient is divorced. He was married to his wife for over 30 years and she helps with the patient's care at times. The patient has four living children and lives with his son.  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  He enjoyed being outdoors hunting and fishing. The family would take trips to the beach or  [place]. Prior to his hospitalization two weeks ago the patient was using a cane but is now using a wheelchair and walker around the house. He wears incontinence briefs but can sometimes tell family when he needs to go to the restroom. At baseline he knows his name and family members but is disoriented to time, location, and situation. He requires assistance with most ADLs and IADLs. After his hospitalization two weeks ago he has been more afraid of walking than in the past-- PT was helping with this. According to his daughter his appetite is good.   A discussion was had today regarding advanced directives. Patient's daughter Malachy Coleman shared she is the patient's HCPOA. The patient did not have a living will. Confirmed patient's DNR/DNI status. Discussed scopes of care. We discussed the difference between an aggressive medical intervention path and a comfort focused path. [name]  shared she does not want her father to suffer and this is why they recently changed his code status and made him DNI. She does want time for outcomes and improvement from the sepsis. We discussed how dementia is chronic and progressive and options moving forward when/if the patient declines further including both outpatient palliative or home hospice. Daughter is concerned about anticipatory care needs.  Discussed the importance of continued conversation with family and the medical providers regarding overall plan of care and treatment options, ensuring decisions are within the context of the patient's values and GOCs.  Questions and concerns were addressed. Hard Choices booklet left for review. The family was encouraged to call with questions or concerns. PMT will continue to support holistically.  Review of Systems  Unable to perform ROS   Objective:   Primary Diagnoses: Present on Admission:  Sepsis secondary to UTI (HCC)  Severe sepsis (HCC)  Dementia without behavioral disturbance (HCC)  Bladder cancer (HCC)  History of pulmonary embolism  Acute renal failure superimposed on stage 3a chronic kidney disease (HCC)  Coagulopathy (HCC)  Primary hypertension   Physical Exam Vitals reviewed.  Constitutional:      General: He is not in acute distress.    Appearance: He is ill-appearing.  Cardiovascular:     Rate and Rhythm: Normal rate.  Pulmonary:     Effort: Pulmonary effort is normal.  Neurological:     Mental Status: He is alert. He is disoriented.     Vital Signs:  BP (!) 92/52 (BP Location: Left Arm)   Pulse 65   Temp 98.3 F (36.8 C) (Axillary)   Resp 19   Ht 5' 9 (1.753 m)   Wt 71.2 kg   SpO2 100%   BMI 23.18 kg/m    Advanced Care Planning:   Existing Vynca/ACP Documentation: None  Primary Decision Maker: HCPOA: Daughter [name] Pinal  Code Status/Advance Care Planning: DNR   Assessment & Plan:   SUMMARY OF RECOMMENDATIONS   Continue  DNR/DNI Allow time for outcomes Continued PMT support   Discussed with: Dr. Cheryle and bedside RN  Time Total: 75 minutes    Thank you for allowing us  to participate in the care of Reinhart Limbach PMT will continue to support holistically.   Signed by: Stephane Palin, NP Palliative Medicine Team  Team Phone # [phone] (Nights/Weekends)  04/08/2024, 3:31 PM

## 2024-04-08 NOTE — Progress Notes (Signed)
 PHARMACY - PHYSICIAN COMMUNICATION CRITICAL VALUE ALERT - BLOOD CULTURE IDENTIFICATION (BCID)  Connor Morris is an 89 y.o. male who presented to [facility] on 04/07/2024 with a chief complaint of sepsis/UTI  Name of physician (or Provider) Contacted: Dr. Cheryle  Current antibiotics: Cefepime    Changes to prescribed antibiotics recommended:  DC Cefepime  Start Ceftriaxone  2g IV q24h  Results for orders placed or performed during the hospital encounter of 04/07/24  Blood Culture ID Panel (Reflexed) (Collected: 04/07/2024  5:00 PM)  Result Value Ref Range   Enterococcus faecalis NOT DETECTED NOT DETECTED   Enterococcus Faecium NOT DETECTED NOT DETECTED   Listeria monocytogenes NOT DETECTED NOT DETECTED   Staphylococcus species NOT DETECTED NOT DETECTED   Staphylococcus aureus (BCID) NOT DETECTED NOT DETECTED   Staphylococcus epidermidis NOT DETECTED NOT DETECTED   Staphylococcus lugdunensis NOT DETECTED NOT DETECTED   Streptococcus species NOT DETECTED NOT DETECTED   Streptococcus agalactiae NOT DETECTED NOT DETECTED   Streptococcus pneumoniae NOT DETECTED NOT DETECTED   Streptococcus pyogenes NOT DETECTED NOT DETECTED   A.calcoaceticus-baumannii NOT DETECTED NOT DETECTED   Bacteroides fragilis NOT DETECTED NOT DETECTED   Enterobacterales DETECTED (A) NOT DETECTED   Enterobacter cloacae complex NOT DETECTED NOT DETECTED   Escherichia coli DETECTED (A) NOT DETECTED   Klebsiella aerogenes NOT DETECTED NOT DETECTED   Klebsiella oxytoca NOT DETECTED NOT DETECTED   Klebsiella pneumoniae NOT DETECTED NOT DETECTED   Proteus species NOT DETECTED NOT DETECTED   Salmonella species NOT DETECTED NOT DETECTED   Serratia marcescens NOT DETECTED NOT DETECTED   Haemophilus influenzae NOT DETECTED NOT DETECTED   Neisseria meningitidis NOT DETECTED NOT DETECTED   Pseudomonas aeruginosa NOT DETECTED NOT DETECTED   Stenotrophomonas maltophilia NOT DETECTED NOT DETECTED   Candida albicans NOT  DETECTED NOT DETECTED   Candida auris NOT DETECTED NOT DETECTED   Candida glabrata NOT DETECTED NOT DETECTED   Candida krusei NOT DETECTED NOT DETECTED   Candida parapsilosis NOT DETECTED NOT DETECTED   Candida tropicalis NOT DETECTED NOT DETECTED   Cryptococcus neoformans/gattii NOT DETECTED NOT DETECTED   CTX-M ESBL NOT DETECTED NOT DETECTED   Carbapenem resistance IMP NOT DETECTED NOT DETECTED   Carbapenem resistance KPC NOT DETECTED NOT DETECTED   Carbapenem resistance NDM NOT DETECTED NOT DETECTED   Carbapenem resist OXA 48 LIKE NOT DETECTED NOT DETECTED   Carbapenem resistance VIM NOT DETECTED NOT DETECTED    Clair Agent 04/08/2024  7:47 AM

## 2024-04-08 NOTE — Evaluation (Signed)
 Clinical/Bedside Swallow Evaluation Patient Details  Name: Connor Morris MRN: [id number] Date of Birth: 1934-06-22  Today's Date: 04/08/2024 Time: SLP Start Time (ACUTE ONLY): 1440 SLP Stop Time (ACUTE ONLY): 1448 SLP Time Calculation (min) (ACUTE ONLY): 8 min  Past Medical History:  Past Medical History:  Diagnosis Date   1st degree AV block    AAA (abdominal aortic aneurysm) (HCC) 03/2018   followed by dr lucas--- last CTA  4.6cm   Anticoagulated 03/2018   xarelto  for hx pe/ dvt--- managed by pcp   Arthritis    Cancer (HCC)    Emphysema lung (HCC)    Hiatal hernia    History of bladder cancer    urologist--- dr rosalind   s/p TURBT feb and march 2021   History of DVT of lower extremity 03/2018   LLE   History of prostate cancer 2002   s/p radioactive seed implants 04-11-2001   History of pulmonary embolus (PE) 03/2018   left side--  on xarelto    History of sepsis 01/30/2020   hospital admission in epic;  due to UTI   Impaired memory    Retained ureteral stent    right side   Sinus bradycardia 2018   cardiologist--- dr calhoun   Urgency of urination    Past Surgical History:  Past Surgical History:  Procedure Laterality Date   CATARACT EXTRACTION W/ INTRAOCULAR LENS IMPLANT Right 2020   CYSTOSCOPY N/A 10/30/2019   Procedure: CYSTOSCOPY;  Surgeon: Sherrilee Belvie CROME, MD;  Location: [facility];  Service: Urology;  Laterality: N/A;   CYSTOSCOPY W/ RETROGRADES Bilateral 09/20/2019   Procedure: CYSTOSCOPY WITH RETROGRADE PYELOGRAM;  Surgeon: Sherrilee Belvie CROME, MD;  Location: [facility];  Service: Urology;  Laterality: Bilateral;   CYSTOSCOPY W/ URETERAL STENT REMOVAL N/A 03/25/2020   Procedure: CYSTOSCOPY WITH  REMOVAL OF RETAINED FOLEY CATHETER,  FULGERATION;  Surgeon: rosalind Zachary NOVAK, MD;  Location: [facility];  Service: Urology;  Laterality: N/A;   FOOT SURGERY  yrs ago   RADIOACTIVE PROSTATE SEED IMPLANTS  04-11-2001  dr  aleene  @[email or web address]    TRANSURETHRAL RESECTION OF BLADDER TUMOR N/A 09/20/2019   Procedure: TRANSURETHRAL RESECTION OF BLADDER TUMOR (TURBT);  Surgeon: Sherrilee Belvie CROME, MD;  Location: [facility];  Service: Urology;  Laterality: N/A;  1 HR   TRANSURETHRAL RESECTION OF BLADDER TUMOR N/A 10/30/2019   Procedure: TRANSURETHRAL RESECTION OF BLADDER TUMOR (TURBT), REMOVAL OF RIGHT STENT;  Surgeon: Sherrilee Belvie CROME, MD;  Location: [facility];  Service: Urology;  Laterality: N/A;   HPI:  Patient is an 89 y.o. male with PMH: dementia, HTN, bladder cancer s/p TURBT, DVT, hiatal hernia, dysphagia. He lives at home and his son and a cousin provide nearly 24 hour supervision for him. (per his sister, he is alone approximately 9pm-11pm/12am) He presented to the hospital on 04/07/24 after being noted to be too weak and fatigued to participate with [facility] PT session 8/22. He became progressively more lethargic, was febrile and started developing shaking chills. In ED, he was febrile but saturating well on RA. HR normal and BP stable. CXR negative for acute findings. UA notable for bacteriuria, pyuria and positive nitrites. He was started on a dysphagia 3 (mechanical soft) solids, thin liquids diet. RN concerned about his ability to safely take PO's secondary to lethargy. MD ordered SLP to evaluate swallow function.    Assessment / Plan / Recommendation  Clinical Impression  Patient presents with clinical s/s of  dysphagia as per this BSE but suspect his swallow function has not significantly changed since recent past admission which included BSE with SLP on 03/21/2024. RN expressed her overall concern is his lethargy but she was able to administer his medications. When SLP arrived into room, patient was awake, appeared generally weak with oral cavity appearing dry but free from secretions. His daughter was in the room as well. Patient was not especially hungry but he was willing to take some sips of  thin liquids (water ), exhibiting mildly delayed throat clear but otherwise appearing to tolerate. He indicated he would be willing to try some PO's later and daughter was planning to order him something like mashed potatoes. Although BSE was limited today, SLP recommending to continue with dys 3 (mechanical soft) solids, thin liquids and will follow for toleration. SLP Visit Diagnosis: Dysphagia, unspecified (R13.10)    Aspiration Risk  Mild aspiration risk    Diet Recommendation Dysphagia 3 (Mech soft);Thin liquid    Liquid Administration via: Cup;Straw Medication Administration: Other (Comment) (as tolerated) Supervision: Staff to assist with self feeding Compensations: Minimize environmental distractions;Slow rate;Small sips/bites Postural Changes: Seated upright at 90 degrees    Other  Recommendations Oral Care Recommendations: Oral care BID     Assistance Recommended at Discharge    Functional Status Assessment Patient has had a recent decline in their functional status and demonstrates the ability to make significant improvements in function in a reasonable and predictable amount of time.  Frequency and Duration min 2x/week  1 week       Prognosis Prognosis for improved oropharyngeal function: Good Barriers to Reach Goals: Cognitive deficits      Swallow Study   General Date of Onset: 04/08/24 HPI: Patient is an 89 y.o. male with PMH: dementia, HTN, bladder cancer s/p TURBT, DVT, hiatal hernia, dysphagia. He lives at home and his son and a cousin provide nearly 24 hour supervision for him. (per his sister, he is alone approximately 9pm-11pm/12am) He presented to the hospital on 04/07/24 after being noted to be too weak and fatigued to participate with [facility] PT session 8/22. He became progressively more lethargic, was febrile and started developing shaking chills. In ED, he was febrile but saturating well on RA. HR normal and BP stable. CXR negative for acute findings. UA notable for  bacteriuria, pyuria and positive nitrites. He was started on a dysphagia 3 (mechanical soft) solids, thin liquids diet. RN concerned about his ability to safely take PO's secondary to lethargy. MD ordered SLP to evaluate swallow function. Type of Study: Bedside Swallow Evaluation Previous Swallow Assessment: during recent past admission (03/21/24 BSE) Diet Prior to this Study: Dysphagia 3 (mechanical soft);Thin liquids (Level 0) Temperature Spikes Noted: No Respiratory Status: Room air History of Recent Intubation: No Behavior/Cognition: Alert;Cooperative;Lethargic/Drowsy Oral Cavity Assessment: Dry Oral Care Completed by SLP: Recent completion by staff Oral Cavity - Dentition: Missing dentition Self-Feeding Abilities: Total assist Patient Positioning: Upright in bed Baseline Vocal Quality: Low vocal intensity;Normal Volitional Cough: Cognitively unable to elicit Volitional Swallow: Unable to elicit    Oral/Motor/Sensory Function Overall Oral Motor/Sensory Function: Generalized oral weakness   Ice Chips     Thin Liquid Thin Liquid: Impaired Presentation: Straw Pharyngeal  Phase Impairments: Throat Clearing - Delayed    Nectar Thick     Honey Thick     Puree Puree: Not tested   Solid     Solid: Not tested      Norleen IVAR Blase, MA, CCC-SLP Speech Therapy

## 2024-04-09 DIAGNOSIS — Z7189 Other specified counseling: Secondary | ICD-10-CM | POA: Diagnosis not present

## 2024-04-09 DIAGNOSIS — N390 Urinary tract infection, site not specified: Secondary | ICD-10-CM | POA: Diagnosis not present

## 2024-04-09 DIAGNOSIS — Z515 Encounter for palliative care: Secondary | ICD-10-CM | POA: Diagnosis not present

## 2024-04-09 DIAGNOSIS — F0390 Unspecified dementia without behavioral disturbance: Secondary | ICD-10-CM | POA: Diagnosis not present

## 2024-04-09 DIAGNOSIS — A419 Sepsis, unspecified organism: Secondary | ICD-10-CM | POA: Diagnosis not present

## 2024-04-09 LAB — CBC
HCT: 30.5 % — ABNORMAL LOW (ref 39.0–52.0)
Hemoglobin: 9.6 g/dL — ABNORMAL LOW (ref 13.0–17.0)
MCH: 28 pg (ref 26.0–34.0)
MCHC: 31.5 g/dL (ref 30.0–36.0)
MCV: 88.9 fL (ref 80.0–100.0)
Platelets: 149 K/uL — ABNORMAL LOW (ref 150–400)
RBC: 3.43 MIL/uL — ABNORMAL LOW (ref 4.22–5.81)
RDW: 14.3 % (ref 11.5–15.5)
WBC: 6.4 K/uL (ref 4.0–10.5)
nRBC: 0 % (ref 0.0–0.2)

## 2024-04-09 LAB — BASIC METABOLIC PANEL WITH GFR
Anion gap: 9 (ref 5–15)
BUN: 27 mg/dL — ABNORMAL HIGH (ref 8–23)
CO2: 22 mmol/L (ref 22–32)
Calcium: 8.8 mg/dL — ABNORMAL LOW (ref 8.9–10.3)
Chloride: 106 mmol/L (ref 98–111)
Creatinine, Ser: 1.31 mg/dL — ABNORMAL HIGH (ref 0.61–1.24)
GFR, Estimated: 52 mL/min — ABNORMAL LOW (ref >60)
Glucose, Bld: 95 mg/dL (ref 70–99)
Potassium: 4.4 mmol/L (ref 3.5–5.1)
Sodium: 137 mmol/L (ref 135–145)

## 2024-04-09 LAB — MAGNESIUM: Magnesium: 1.7 mg/dL (ref 1.7–2.4)

## 2024-04-09 LAB — C-REACTIVE PROTEIN: CRP: 21.7 mg/dL — ABNORMAL HIGH (ref <1.0)

## 2024-04-09 MED ORDER — ENSURE PLUS HIGH PROTEIN PO LIQD
237.0000 mL | Freq: Three times a day (TID) | ORAL | Status: DC
  Administered 2024-04-09 – 2024-04-13 (×8): 237 mL via ORAL
  Filled 2024-04-09: qty 237

## 2024-04-09 MED ORDER — ADULT MULTIVITAMIN W/MINERALS CH
1.0000 | ORAL_TABLET | Freq: Every day | ORAL | Status: DC
  Administered 2024-04-10 – 2024-04-13 (×4): 1 via ORAL
  Filled 2024-04-09 (×4): qty 1

## 2024-04-09 NOTE — Progress Notes (Addendum)
 Initial Nutrition Assessment  DOCUMENTATION CODES:   Not applicable  INTERVENTION:   -Continue dysphagia 3 diet with thin liquids -MVI with minerals daily -Ensure Plus High Protein po TID, each supplement provides 350 kcal and 20 grams of protein  -Magic cup TID with meals, each supplement provides 290 kcal and 9 grams of protein  -Feeding assistance with meals   NUTRITION DIAGNOSIS:   Inadequate oral intake related to decreased appetite as evidenced by per patient/family report, meal completion < 25%.  GOAL:   Patient will meet greater than or equal to 90% of their needs  MONITOR:   PO intake, Supplement acceptance, Diet advancement  REASON FOR ASSESSMENT:   Consult Assessment of nutrition requirement/status  ASSESSMENT:   Pt with medical history significant for hypertension, history of DVT and PE on Xarelto , bladder cancer status post TURBT, and dementia presented with lethargy, shaking chills and loss of appetite along with fever.  Pt admitted with severe sepsus and AKI superimposed on CKD stage IIIa.   8/24- s/p BSE- dysphagia 3 diet with thin liquids  Reviewed I/O's: +771 ml x 24 hours and +3.6 L since admission  UOP: 1 L x 24 hours  Pt unavailable at time of visit. Attempted to speak with pt via call to hospital room phone, however, unable to reach. RD unable to obtain further nutrition-related history or complete nutrition-focused physical exam at this time.    Per H&P, pt was very weak at home and unable to participate in home health PTA on 04/06/24 due to this. He became lethargic and developed fever, shaking, and chills, which prompted hospital admission.   Palliative care following for goals of care discussions. Per palliative care notes, appetite was good PTA. Pt was weak and had more difficulty with mobility; working with home health PT for this. He lives with his son and his ex-wife also assists with care. Pt is currently an DNR/ DNI; family would like more  time for outcomes and improvement in sepsis.   Reached out to RN via secure chat to discuss case. Meal completions 5% per flowsheets.   Reviewed wt hx; pt has experienced a 1.7% wt loss over the past month, which is not significant for time frame.   Pt is at risk for malnutrition given advanced age, poor oral intake, and dementia. RD will follow for further goals of care discussions; would be hesitant to recommend artifical means of nutrition/ hydration secondary to advanced age and dementia.   Medications reviewed and include protonix , rocpehin, and 0.9% sodium chloride  infusion @ 100 ml/hr.   Labs reviewed: CBGS: 89.    Diet Order:   Diet Order             DIET DYS 3 Room service appropriate? Yes; Fluid consistency: Thin  Diet effective now                   EDUCATION NEEDS:   No education needs have been identified at this time  Skin:  Skin Assessment: Skin Integrity Issues: Skin Integrity Issues:: Other (Comment) Other: discoloration to buttocks and groin  Last BM:  04/05/24  Height:   Ht Readings from Last 1 Encounters:  04/07/24 5' 9 (1.753 m)    Weight:   Wt Readings from Last 1 Encounters:  04/09/24 70.4 kg    Ideal Body Weight:  72.7 kg  BMI:  Body mass index is 22.92 kg/m.  Estimated Nutritional Needs:   Kcal:  1750-1950  Protein:  85-100 grams  Fluid:  1.7-1.9 L    Margery ORN, RD, LDN, CDCES Registered Dietitian III Certified Diabetes Care and Education Specialist If unable to reach this RD, please use RD Inpatient group chat on secure chat between hours of 8am-4 pm daily

## 2024-04-09 NOTE — Plan of Care (Signed)
  Problem: Education: Goal: Knowledge of risk factors and measures for prevention of condition will improve Outcome: Progressing   Problem: Coping: Goal: Psychosocial and spiritual needs will be supported Outcome: Progressing   Problem: Respiratory: Goal: Will maintain a patent airway Outcome: Progressing Goal: Complications related to the disease process, condition or treatment will be avoided or minimized Outcome: Progressing   Problem: Fluid Volume: Goal: Hemodynamic stability will improve Outcome: Progressing   Problem: Clinical Measurements: Goal: Diagnostic test results will improve Outcome: Progressing Goal: Signs and symptoms of infection will decrease Outcome: Progressing   Problem: Respiratory: Goal: Ability to maintain adequate ventilation will improve Outcome: Progressing   Problem: Education: Goal: Knowledge of General Education information will improve Description: Including pain rating scale, medication(s)/side effects and non-pharmacologic comfort measures Outcome: Progressing   Problem: Health Behavior/Discharge Planning: Goal: Ability to manage health-related needs will improve Outcome: Progressing   Problem: Clinical Measurements: Goal: Ability to maintain clinical measurements within normal limits will improve Outcome: Progressing Goal: Will remain free from infection Outcome: Progressing Goal: Diagnostic test results will improve Outcome: Progressing Goal: Respiratory complications will improve Outcome: Progressing Goal: Cardiovascular complication will be avoided Outcome: Progressing   Problem: Activity: Goal: Risk for activity intolerance will decrease Outcome: Progressing   Problem: Nutrition: Goal: Adequate nutrition will be maintained Outcome: Progressing   Problem: Coping: Goal: Level of anxiety will decrease Outcome: Progressing   Problem: Elimination: Goal: Will not experience complications related to bowel motility Outcome:  Progressing Goal: Will not experience complications related to urinary retention Outcome: Progressing   Problem: Pain Managment: Goal: General experience of comfort will improve and/or be controlled Outcome: Progressing   Problem: Safety: Goal: Ability to remain free from injury will improve Outcome: Progressing   Problem: Skin Integrity: Goal: Risk for impaired skin integrity will decrease Outcome: Progressing

## 2024-04-09 NOTE — Progress Notes (Addendum)
 Speech Language Pathology Treatment: Dysphagia  Patient Details Name: Connor Morris MRN: [id number] DOB: [birth year] Today's Date: 04/09/2024 Time: 1012-1021 SLP Time Calculation (min) (ACUTE ONLY): 9 min  Assessment / Plan / Recommendation Clinical Impression  Pt was alert and feeding himself from breakfast tray. No s/s of aspiration were observed with consecutive straw sips of thin liquids. Mastication is prolonged but overall functional with Min cueing to use a liquid wash and initiate a swallow. This is suspected to be consistent with his baseline given history of dementia. Recommend continuing Dys 3 solids with thin liquids. He may benefit from set up assistance and intermittent cueing to clear his oral cavity. Otherwise, no further SLP f/u is needed. Will sign off.    HPI HPI: Patient is an 89 y.o. male with PMH: dementia, HTN, bladder cancer s/p TURBT, DVT, hiatal hernia, dysphagia. He lives at home and his son and a cousin provide nearly 24 hour supervision for him. (per his sister, he is alone approximately 9pm-11pm/12am) He presented to the hospital on 04/07/24 after being noted to be too weak and fatigued to participate with [facility] PT session 8/22. He became progressively more lethargic, was febrile and started developing shaking chills. In ED, he was febrile but saturating well on RA. HR normal and BP stable. CXR negative for acute findings. UA notable for bacteriuria, pyuria and positive nitrites. He was started on a dysphagia 3 (mechanical soft) solids, thin liquids diet. RN concerned about his ability to safely take PO's secondary to lethargy. MD ordered SLP to evaluate swallow function.      SLP Plan  All goals met          Recommendations  Diet recommendations: Dysphagia 3 (mechanical soft);Thin liquid Liquids provided via: Cup;Straw Medication Administration: Crushed with puree Supervision: Patient able to self feed Compensations: Minimize environmental distractions;Slow  rate;Small sips/bites Postural Changes and/or Swallow Maneuvers: Seated upright 90 degrees                  Oral care BID   PRN Dysphagia, unspecified (R13.10)     All goals met     Damien Blumenthal, M.A., CCC-SLP Speech Language Pathology, Acute Rehabilitation Services  Secure Chat preferred [phone]   04/09/2024, 10:54 AM

## 2024-04-09 NOTE — Progress Notes (Signed)
 Palliative:  HPI: 89 y.o. male  with past medical history of sinus bradycardia, hyperlipidemia, abdominal ascending aortic aneurysm, history of DVT/PE on Xarelto , bladder and prostate cancer, dementia, chronic kidney disease, and chronic normocytic anemia admitted on 04/07/2024 with lethargy, shaking chills, loss of appetite, and fever. On presentation, he was febrile. Creatinine was 1.8, WBC of 16,000, lactic acid of 3.6 and INR of 6.1. UA was suggestive of UTI. Chest x-ray negative for acute findings. He was started on IV fluids and antibiotics.   I met today at Mr. Sida bedside but no family present. He awakens and is pleasantly confused. Breakfast is in front of him and he has only eaten a few bites and a few ounces - he says he is not really hungry or thirsty right now. He smiles and is in good spirits. He has no complaints. He is happy that I will call and reach out to his daughter, Connor Morris.   Thorough chart review. Noted palliative consultation note from yesterday. He is stable and labs are stable. No concerns for decline at this time. Time for outcomes. Monitor intake closely. I did call and left HIPAA complaint voicemail for daughter, Connor Morris, expressing no urgency but ensured she had my contact information.   I will continue to follow along.   Exam: Awakens easily. Good spirits. No distress. No complaints. Breathing regular, unlabored. Abd soft. Moves all extremities. Generalized weakness and fatigue.   Plan: - DNR in place - Time for outcomes  30 min  Bernarda Kitty, NP Palliative Medicine Team Pager [phone] (Please see [email or web address] for schedule) Team Phone [phone]

## 2024-04-09 NOTE — Evaluation (Signed)
 Physical Therapy Evaluation Patient Details Name: Connor Morris MRN: [id number] DOB: [birth year] Today's Date: 04/09/2024  History of Present Illness  89 yo male admitted 8/23 with sepsis UTI PMH dementia, bladder CA s/p transurethral resection, prostate CA, thoracic aortic aneurysm, DVT, AAA  Clinical Impression  Pt admitted with above diagnosis. Limited historian, most information obtained from current notes and prior visits. Reportedly using RW and W/c at home. Assisted by family, has nearly 24/7 assist, but is alone from 9-11p. Pt states he could transfer himself to W/c PTA (unconfirmed by family at this time.) He required up to mod assist +2 for transfer and gait this afternoon. Would likely benefit from continued inpatient follow up therapy, <3 hours/day unless family feel they can adequately provide at his current level of need. Will work with him acutely to see how far he can progress before d/c. Pt currently with functional limitations due to the deficits listed below (see PT Problem List). Pt will benefit from acute skilled PT to increase their independence and safety with mobility to allow discharge.           If plan is discharge home, recommend the following: A lot of help with bathing/dressing/bathroom;Assistance with cooking/housework;Assist for transportation;Help with stairs or ramp for entrance;Supervision due to cognitive status;A lot of help with walking and/or transfers   Can travel by private vehicle   Yes    Equipment Recommendations None recommended by PT  Recommendations for Other Services       Functional Status Assessment Patient has had a recent decline in their functional status and demonstrates the ability to make significant improvements in function in a reasonable and predictable amount of time.     Precautions / Restrictions Precautions Precautions: Fall Recall of Precautions/Restrictions: Impaired Restrictions Weight Bearing Restrictions Per Provider  Order: No      Mobility  Bed Mobility Overal bed mobility: Needs Assistance Bed Mobility: Supine to Sit     Supine to sit: HOB elevated, Min assist     General bed mobility comments: Min assist for trunk support to rise and bed pad to scoot to EOB. leaning towards left and posteriorly. Cues for technique.    Transfers Overall transfer level: Needs assistance Equipment used: 2 person hand held assist Transfers: Sit to/from Stand Sit to Stand: Mod assist, +2 physical assistance, +2 safety/equipment           General transfer comment: Mod assist +2 for boost and balance to stand with bil UE supported by therapisits, max cues for upright posture, knee and hip extension with forward gaze, leaning posteriorly.    Ambulation/Gait Ambulation/Gait assistance: Mod assist, +2 physical assistance, +2 safety/equipment Gait Distance (Feet): 15 Feet Assistive device: 2 person hand held assist Gait Pattern/deviations: Step-through pattern, Decreased stride length, Shuffle, Knees buckling, Drifts right/left, Trunk flexed, Narrow base of support Gait velocity: dec Gait velocity interpretation: <1.31 ft/sec, indicative of household ambulator Pre-gait activities: Weight shift, mod assist General Gait Details: Mod assist +2 for balance, multimodal cues to facilitate steps and stand upright. Intermittent buckling and LEs dragging at times. Improves briefly with cues for foot clearance and larger step. BIL UEs supported by therapists.  Stairs            Wheelchair Mobility     Tilt Bed    Modified Rankin (Stroke Patients Only)       Balance Overall balance assessment: Needs assistance Sitting-balance support: Feet supported, Single extremity supported Sitting balance-Leahy Scale: Poor Sitting balance - Comments: Progressed eventually to  CGA but has to lean on Lt elbow to sustain. Postural control: Posterior lean, Left lateral lean Standing balance support: Bilateral upper  extremity supported Standing balance-Leahy Scale: Poor                               Pertinent Vitals/Pain Pain Assessment Pain Assessment: No/denies pain Pain Intervention(s): Monitored during session    Home Living Family/patient expects to be discharged to:: Private residence Living Arrangements: Children Available Help at Discharge: Family (nearly 24/7 (alone from 9-11pm)) Type of Home: House Home Access: Stairs to enter Entrance Stairs-Rails: Doctor, general practice of Steps: 2   Home Layout: Two level;Able to live on main level with bedroom/bathroom Home Equipment: Rolling Walker (2 wheels);Cane - single point;BSC/3in1;Wheelchair - manual Additional Comments: Majority of information about home set-up obtained from prior admission.    Prior Function Prior Level of Function : Needs assist;Patient poor historian/Family not available             Mobility Comments: Notes indicate pt has declined to using RW and W/c since last admission. Pt states he can transfer himself to w/c at home (but currently questionable historian.)       Extremity/Trunk Assessment   Upper Extremity Assessment Upper Extremity Assessment: Defer to OT evaluation    Lower Extremity Assessment Lower Extremity Assessment: Generalized weakness    Cervical / Trunk Assessment Cervical / Trunk Assessment: Kyphotic  Communication   Communication Communication: Impaired Factors Affecting Communication: Reduced clarity of speech    Cognition Arousal: Alert Behavior During Therapy: Flat affect   PT - Cognitive impairments: History of cognitive impairments, No family/caregiver present to determine baseline                         Following commands: Impaired Following commands impaired: Only follows one step commands consistently, Follows one step commands with increased time     Cueing Cueing Techniques: Verbal cues, Gestural cues     General Comments  General comments (skin integrity, edema, etc.): HR 50s at rest, up to 90 with activity. CGA at edge of sink    Exercises     Assessment/Plan    PT Assessment Patient needs continued PT services  PT Problem List Decreased strength;Decreased activity tolerance;Decreased balance;Decreased mobility;Decreased coordination;Decreased cognition;Decreased knowledge of use of DME;Decreased safety awareness;Decreased knowledge of precautions       PT Treatment Interventions DME instruction;Gait training;Functional mobility training;Therapeutic activities;Therapeutic exercise;Balance training;Patient/family education;Stair training;Neuromuscular re-education;Cognitive remediation;Wheelchair mobility training    PT Goals (Current goals can be found in the Care Plan section)  Acute Rehab PT Goals Patient Stated Goal: none stated PT Goal Formulation: Patient unable to participate in goal setting Time For Goal Achievement: 04/23/24 Potential to Achieve Goals: Fair    Frequency Min 2X/week     Co-evaluation PT/OT/SLP Co-Evaluation/Treatment: Yes Reason for Co-Treatment: Necessary to address cognition/behavior during functional activity;For patient/therapist safety;To address functional/ADL transfers PT goals addressed during session: Mobility/safety with mobility;Balance         AM-PAC PT 6 Clicks Mobility  Outcome Measure Help needed turning from your back to your side while in a flat bed without using bedrails?: A Little Help needed moving from lying on your back to sitting on the side of a flat bed without using bedrails?: A Little Help needed moving to and from a bed to a chair (including a wheelchair)?: A Lot Help needed standing up from a chair using  your arms (e.g., wheelchair or bedside chair)?: A Lot Help needed to walk in hospital room?: Total Help needed climbing 3-5 steps with a railing? : Total 6 Click Score: 12    End of Session Equipment Utilized During Treatment: Gait  belt Activity Tolerance: Patient tolerated treatment well Patient left: with call bell/phone within reach;in chair;with chair alarm set Nurse Communication: Mobility status PT Visit Diagnosis: Unsteadiness on feet (R26.81);Muscle weakness (generalized) (M62.81);Difficulty in walking, not elsewhere classified (R26.2);Other abnormalities of gait and mobility (R26.89);Other symptoms and signs involving the nervous system (R29.898)    Time: 8859-8797 PT Time Calculation (min) (ACUTE ONLY): 22 min   Charges:   PT Evaluation $PT Eval Moderate Complexity: 1 Mod   PT General Charges $$ ACUTE PT VISIT: 1 Visit         Leontine Roads, PT, DPT [facility] Physical Therapist Office: [phone] Website: [email or web address]   Leontine GORMAN Roads 04/09/2024, 1:57 PM

## 2024-04-09 NOTE — Evaluation (Signed)
 Occupational Therapy Evaluation Patient Details Name: Connor Morris MRN: [id number] DOB: 1933/09/20 Today's Date: 04/09/2024   History of Present Illness   89 yo male admitted 8/23 with sepsis UTI PMH dementia, bladder CA s/p transurethral resection, prostate CA, thoracic aortic aneurysm, DVT, AAA     Clinical Impressions PT admitted with sepsis UTI (+). Pt currently with functional limitiations due to the deficits listed below (see OT problem list). Pt from home and using w/c for transportation for chart. Pt will d/c home with family if able to manage. Pt currently 1 person stand pivot transfer and total +2 mod (A) for transfer.  Pt will benefit from skilled OT to increase their independence and safety with adls and balance to allow discharge skilled inpatient follow up therapy, <3 hours/day. .      If plan is discharge home, recommend the following:   A lot of help with bathing/dressing/bathroom;A little help with walking and/or transfers     Functional Status Assessment   Patient has had a recent decline in their functional status and demonstrates the ability to make significant improvements in function in a reasonable and predictable amount of time.     Equipment Recommendations   None recommended by OT     Recommendations for Other Services         Precautions/Restrictions   Precautions Precautions: Fall Recall of Precautions/Restrictions: Impaired Restrictions Weight Bearing Restrictions Per Provider Order: No     Mobility Bed Mobility Overal bed mobility: Needs Assistance Bed Mobility: Supine to Sit     Supine to sit: HOB elevated, Min assist     General bed mobility comments: Min assist for trunk support to rise and bed pad to scoot to EOB. leaning towards left and posteriorly. Cues for technique.    Transfers Overall transfer level: Needs assistance Equipment used: 2 person hand held assist Transfers: Sit to/from Stand Sit to Stand: Mod  assist, +2 physical assistance, +2 safety/equipment           General transfer comment: Mod assist +2 for boost and balance to stand with bil UE supported by therapisits, max cues for upright posture, knee and hip extension with forward gaze, leaning posteriorly.      Balance Overall balance assessment: Needs assistance Sitting-balance support: Feet supported, Single extremity supported Sitting balance-Leahy Scale: Poor Sitting balance - Comments: Progressed eventually to CGA but has to lean on Lt elbow to sustain. Postural control: Posterior lean, Left lateral lean Standing balance support: Bilateral upper extremity supported Standing balance-Leahy Scale: Poor                             ADL either performed or assessed with clinical judgement   ADL Overall ADL's : Needs assistance/impaired Eating/Feeding: Minimal assistance;Sitting Eating/Feeding Details (indicate cue type and reason): needs increased (A) to initiate Grooming: Wash/dry hands;Standing;Moderate assistance Grooming Details (indicate cue type and reason): needs cues for soap             Lower Body Dressing: Total assistance Lower Body Dressing Details (indicate cue type and reason): holding socks but unable to reach feet Toilet Transfer: +2 for physical assistance;Moderate assistance           Functional mobility during ADLs: +2 for physical assistance;Moderate assistance;Rolling walker (2 wheels)       Vision   Vision Assessment?: No apparent visual deficits     Perception         Praxis  Pertinent Vitals/Pain Pain Assessment Pain Assessment: No/denies pain     Extremity/Trunk Assessment Upper Extremity Assessment Upper Extremity Assessment: Right hand dominant;RUE deficits/detail;LUE deficits/detail RUE Deficits / Details: tremor noted with intentional movement LUE Deficits / Details: tremor noted with intentional movement   Lower Extremity Assessment Lower  Extremity Assessment: Defer to PT evaluation   Cervical / Trunk Assessment Cervical / Trunk Assessment: Kyphotic   Communication Communication Communication: Impaired Factors Affecting Communication: Reduced clarity of speech   Cognition Arousal: Alert Behavior During Therapy: Flat affect Cognition: Cognition impaired, No family/caregiver present to determine baseline             OT - Cognition Comments: baseline dementia hx. pt demosntrates decreased initiation                 Following commands: Impaired Following commands impaired: Only follows one step commands consistently, Follows one step commands with increased time     Cueing  General Comments   Cueing Techniques: Verbal cues;Gestural cues  HR 50s at rest   Exercises     Shoulder Instructions      Home Living Family/patient expects to be discharged to:: Private residence Living Arrangements: Children Available Help at Discharge: Family (nearly 24/7 (alone from 9-11pm)) Type of Home: House Home Access: Stairs to enter [employer] of Steps: 2 Entrance Stairs-Rails: Right;Left Home Layout: Two level;Able to live on main level with bedroom/bathroom         Bathroom Toilet: Standard     Home Equipment: Agricultural consultant (2 wheels);Cane - single point;BSC/3in1;Wheelchair - manual   Additional Comments: Majority of information about home set-up obtained from prior admission.      Prior Functioning/Environment Prior Level of Function : Needs assist;Patient poor historian/Family not available             Mobility Comments: Notes indicate pt has declined to using RW and W/c since last admission. Pt states he can transfer himself to w/c at home (but currently questionable historian.)      OT Problem List: Decreased strength;Decreased activity tolerance;Impaired balance (sitting and/or standing);Decreased cognition   OT Treatment/Interventions: Self-care/ADL training;Therapeutic  exercise;Energy conservation;DME and/or AE instruction;Therapeutic activities;Patient/family education;Balance training      OT Goals(Current goals can be found in the care plan section)   Acute Rehab OT Goals Patient Stated Goal: none stated OT Goal Formulation: With patient Time For Goal Achievement: 04/23/24 Potential to Achieve Goals: Good   OT Frequency:  Min 2X/week    Co-evaluation PT/OT/SLP Co-Evaluation/Treatment: Yes Reason for Co-Treatment: Necessary to address cognition/behavior during functional activity;For patient/therapist safety;To address functional/ADL transfers PT goals addressed during session: Mobility/safety with mobility;Balance OT goals addressed during session: ADL's and self-care      AM-PAC OT 6 Clicks Daily Activity     Outcome Measure Help from another person eating meals?: A Little Help from another person taking care of personal grooming?: A Little Help from another person toileting, which includes using toliet, bedpan, or urinal?: Total Help from another person bathing (including washing, rinsing, drying)?: A Lot Help from another person to put on and taking off regular upper body clothing?: A Little Help from another person to put on and taking off regular lower body clothing?: A Lot 6 Click Score: 14   End of Session Equipment Utilized During Treatment: Rolling walker (2 wheels) Nurse Communication: Mobility status;Precautions  Activity Tolerance: Patient tolerated treatment well;Patient limited by fatigue Patient left: in chair;with call bell/phone within reach;with chair alarm set  OT Visit Diagnosis: Other abnormalities  of gait and mobility (R26.89);Unsteadiness on feet (R26.81);Muscle weakness (generalized) (M62.81);Other symptoms and signs involving cognitive function                Time: 1140-1202 OT Time Calculation (min): 22 min Charges:  OT General Charges $OT Visit: 1 Visit OT Evaluation $OT Eval Moderate Complexity: 1  Mod   Brynn, OTR/L  Acute Rehabilitation Services Office: [phone] .  Ely Molt 04/09/2024, 2:25 PM

## 2024-04-09 NOTE — Progress Notes (Signed)
 PROGRESS NOTE    Connor Morris  FMW:[id number] DOB: [birth year] DOA: 04/07/2024 PCP: Larnell Hamilton, MD   Brief Narrative:  89 y.o. male with medical history significant for hypertension, history of DVT and PE on Xarelto , bladder cancer status post TURBT, and dementia presented with lethargy, shaking chills and loss of appetite along with fever.  On presentation, he was febrile.  Creatinine was 1.8, WBC of 16,000, lactic acid of 3.6 and INR of 6.1.  UA was suggestive of UTI.  Chest x-ray negative for acute findings.  He was started on IV fluids and antibiotics.  Palliative care consulted for goals of care discussion.  Assessment & Plan:   Severe sepsis: Present on admission UTI: Present on admission E. coli bacteremia Lactic acidosis: Resolved -Presented with fever, tachypnea, leukocytosis, lactic acidosis, AKI, encephalopathy and coagulopathy with evidence of UTI -BCID Positive for E. coli.  Follow sensitivities.  Currently on IV Rocephin .  Urine cultures grew less than 10,000 colonies per mL of insignificant growth - Decrease IV fluids to 100 cc an hour. - No temperature spikes over the last 24 hours.  AKI superimposed on CKD stage IIIa - Creatinine 1.8 on presentation.  Improving to 1.31.  IV fluids as above  Leukocytosis - Resolved  Anemia of chronic disease -From chronic illnesses.  Hemoglobin is stable.  Monitor intermittently  Hyponatremia -Resolved  Hypoalbuminemia - From poor oral intake.  Encourage oral intake.  Consult nutrition.  History of DVT and PE Supratherapeutic INR - Xarelto  on hold for now.  INR 6.1 on presentation; improving to 4.8 on 04/08/2024.  Pending today.  Monitor  Dementia -Delirium precautions.  Fall precautions.  PT eval  Hypertension --blood pressure intermittently on the lower side.  Antihypertensives on hold  GERD -Continue PPI  Recent COVID - Tested positive for COVID on 03/21/2019 2:05 days of symptoms and was treated with remdesivir   and Decadron .  Continues to test positive on presentation: No longer requires ambulation  History of bladder cancer Ascending aortic aneurysm -Outpatient follow-up    DVT prophylaxis: SCDs Code Status: DNR Family Communication: None at bedside Disposition Plan: Status is: Inpatient Remains inpatient appropriate because: Of severity of illness.  Need for IV antibiotics    Consultants: Palliative care  Procedures: None  Antimicrobials:  Anti-infectives (From admission, onward)    Start     Dose/Rate Route Frequency Ordered Stop   04/08/24 1000  cefTRIAXone  (ROCEPHIN ) 2 g in sodium chloride  0.9 % 100 mL IVPB        2 g 200 mL/hr over 30 Minutes Intravenous Every 24 hours 04/08/24 0722     04/08/24 0600  ceFEPIme  (MAXIPIME ) 2 g in sodium chloride  0.9 % 100 mL IVPB  Status:  Discontinued        2 g 200 mL/hr over 30 Minutes Intravenous Every 12 hours 04/07/24 2129 04/08/24 0722   04/07/24 1800  ceFEPIme  (MAXIPIME ) 2 g in sodium chloride  0.9 % 100 mL IVPB        2 g 200 mL/hr over 30 Minutes Intravenous  Once 04/07/24 1752 04/07/24 1900   04/07/24 1800  metroNIDAZOLE  (FLAGYL ) IVPB 500 mg        500 mg 100 mL/hr over 60 Minutes Intravenous  Once 04/07/24 1752 04/07/24 1949        Subjective: Patient seen and examined at bedside.  Poor historian.  No agitation, seizures, vomiting noted. Objective: Vitals:   04/08/24 1514 04/08/24 1912 04/08/24 2310 04/09/24 0458  BP: (!) 92/52 126/61 139/81 111/64  Pulse: 65 66 76 65  Resp: 19 17 19 20   Temp: 98.3 F (36.8 C) 98.6 F (37 C) 98.5 F (36.9 C) 98.4 F (36.9 C)  TempSrc: Axillary Oral Oral Axillary  SpO2: 100% 100% 98% 97%  Weight:    70.4 kg  Height:        Intake/Output Summary (Last 24 hours) at 04/09/2024 0719 Last data filed at 04/09/2024 0550 Gross per 24 hour  Intake 1771.25 ml  Output 1000 ml  Net 771.25 ml   Filed Weights   04/07/24 2238 04/08/24 0500 04/09/24 0458  Weight: 71.2 kg 71.2 kg 70.4 kg     Examination:  General: On room air.  No distress.  Chronically ill and deconditioned looking. ENT/neck: No thyromegaly.  JVD is not elevated  respiratory: Decreased breath sounds at bases bilaterally with some crackles; no wheezing  CVS: S1-S2 heard, rate controlled currently Abdominal: Soft, nontender, slightly distended; no organomegaly,  bowel sounds are heard Extremities: Trace lower extremity edema; no cyanosis  CNS: More awake this morning, answer some basic questions but still confused to time.  Slow to respond.  Poor historian.  No focal neurologic deficit.  Moves extremities Lymph: No obvious lymphadenopathy Skin: No obvious ecchymosis/lesions  psych: Mostly flat affect and currently not agitated  musculoskeletal: No obvious joint swelling/deformity     Data Reviewed: I have personally reviewed following labs and imaging studies  CBC: Recent Labs  Lab 04/07/24 1638 04/08/24 0322 04/09/24 0228  WBC 16.0* 12.6* 6.4  NEUTROABS 14.0*  --   --   HGB 11.6* 9.1* 9.6*  HCT 37.5* 28.9* 30.5*  MCV 92.4 88.4 88.9  PLT 216 159 149*   Basic Metabolic Panel: Recent Labs  Lab 04/07/24 1933 04/08/24 0322 04/09/24 0228  NA 134* 135 137  K 5.1 4.5 4.4  CL 102 105 106  CO2 23 22 22   GLUCOSE 101* 104* 95  BUN 29* 30* 27*  CREATININE 1.80* 1.63* 1.31*  CALCIUM 9.5 8.8* 8.8*  MG  --   --  1.7   GFR: Estimated Creatinine Clearance: 38.8 mL/min (A) (by C-G formula based on SCr of 1.31 mg/dL (H)). Liver Function Tests: Recent Labs  Lab 04/07/24 1933 04/08/24 0322  AST 20 16  ALT 12 10  ALKPHOS 40 34*  BILITOT 1.4* 1.4*  PROT 7.1 5.7*  ALBUMIN 2.8* 2.2*   No results for input(s): LIPASE, AMYLASE in the last 168 hours. No results for input(s): AMMONIA in the last 168 hours. Coagulation Profile: Recent Labs  Lab 04/07/24 1638 04/08/24 0322  INR 6.1* 4.8*   Cardiac Enzymes: No results for input(s): CKTOTAL, CKMB, CKMBINDEX, TROPONINI in the last  168 hours. BNP (last 3 results) No results for input(s): PROBNP in the last 8760 hours. HbA1C: No results for input(s): HGBA1C in the last 72 hours. CBG: Recent Labs  Lab 04/08/24 1421  GLUCAP 89   Lipid Profile: No results for input(s): CHOL, HDL, LDLCALC, TRIG, CHOLHDL, LDLDIRECT in the last 72 hours. Thyroid  Function Tests: No results for input(s): TSH, T4TOTAL, FREET4, T3FREE, THYROIDAB in the last 72 hours. Anemia Panel: No results for input(s): VITAMINB12, FOLATE, FERRITIN, TIBC, IRON, RETICCTPCT in the last 72 hours. Sepsis Labs: Recent Labs  Lab 04/07/24 1733 04/07/24 1938 04/07/24 2304 04/08/24 0322  LATICACIDVEN 3.6* 2.4* 4.3* 1.7    Recent Results (from the past 240 hours)  Blood Culture (routine x 2)     Status: None (Preliminary result)   Collection Time: 04/07/24  5:00 PM  Specimen: BLOOD RIGHT HAND  Result Value Ref Range Status   Specimen Description BLOOD RIGHT HAND  Final   Special Requests   Final    BOTTLES DRAWN AEROBIC AND ANAEROBIC Blood Culture adequate volume   Culture  Setup Time   Final    GRAM NEGATIVE RODS IN BOTH AEROBIC AND ANAEROBIC BOTTLES Organism ID to follow CRITICAL RESULT CALLED TO, READ BACK BY AND VERIFIED WITH: PHARMD J LEDFORD 04/08/2024 @ 0701 BY AB Performed at [facility], [address], [place], [place] [address]    Culture GRAM NEGATIVE RODS  Final   Report Status PENDING  Incomplete  Blood Culture ID Panel (Reflexed)     Status: Abnormal   Collection Time: 04/07/24  5:00 PM  Result Value Ref Range Status   Enterococcus faecalis NOT DETECTED NOT DETECTED Final   Enterococcus Faecium NOT DETECTED NOT DETECTED Final   Listeria monocytogenes NOT DETECTED NOT DETECTED Final   Staphylococcus species NOT DETECTED NOT DETECTED Final   Staphylococcus aureus (BCID) NOT DETECTED NOT DETECTED Final   Staphylococcus epidermidis NOT DETECTED NOT DETECTED Final   Staphylococcus  lugdunensis NOT DETECTED NOT DETECTED Final   Streptococcus species NOT DETECTED NOT DETECTED Final   Streptococcus agalactiae NOT DETECTED NOT DETECTED Final   Streptococcus pneumoniae NOT DETECTED NOT DETECTED Final   Streptococcus pyogenes NOT DETECTED NOT DETECTED Final   A.calcoaceticus-baumannii NOT DETECTED NOT DETECTED Final   Bacteroides fragilis NOT DETECTED NOT DETECTED Final   Enterobacterales DETECTED (A) NOT DETECTED Final    Comment: Enterobacterales represent a large order of gram negative bacteria, not a single organism. CRITICAL RESULT CALLED TO, READ BACK BY AND VERIFIED WITH: PHARMD J LEDFORD 04/08/2024 @ 0701 BY AB    Enterobacter cloacae complex NOT DETECTED NOT DETECTED Final   Escherichia coli DETECTED (A) NOT DETECTED Final    Comment: CRITICAL RESULT CALLED TO, READ BACK BY AND VERIFIED WITH: PHARMD J LEDFORD 04/08/2024 @ 0701 BY AB    Klebsiella aerogenes NOT DETECTED NOT DETECTED Final   Klebsiella oxytoca NOT DETECTED NOT DETECTED Final   Klebsiella pneumoniae NOT DETECTED NOT DETECTED Final   Proteus species NOT DETECTED NOT DETECTED Final   Salmonella species NOT DETECTED NOT DETECTED Final   Serratia marcescens NOT DETECTED NOT DETECTED Final   Haemophilus influenzae NOT DETECTED NOT DETECTED Final   Neisseria meningitidis NOT DETECTED NOT DETECTED Final   Pseudomonas aeruginosa NOT DETECTED NOT DETECTED Final   Stenotrophomonas maltophilia NOT DETECTED NOT DETECTED Final   Candida albicans NOT DETECTED NOT DETECTED Final   Candida auris NOT DETECTED NOT DETECTED Final   Candida glabrata NOT DETECTED NOT DETECTED Final   Candida krusei NOT DETECTED NOT DETECTED Final   Candida parapsilosis NOT DETECTED NOT DETECTED Final   Candida tropicalis NOT DETECTED NOT DETECTED Final   Cryptococcus neoformans/gattii NOT DETECTED NOT DETECTED Final   CTX-M ESBL NOT DETECTED NOT DETECTED Final   Carbapenem resistance IMP NOT DETECTED NOT DETECTED Final    Carbapenem resistance KPC NOT DETECTED NOT DETECTED Final   Carbapenem resistance NDM NOT DETECTED NOT DETECTED Final   Carbapenem resist OXA 48 LIKE NOT DETECTED NOT DETECTED Final   Carbapenem resistance VIM NOT DETECTED NOT DETECTED Final    Comment: Performed at [facility], [address] [place], [place] [address]  Blood Culture (routine x 2)     Status: None (Preliminary result)   Collection Time: 04/07/24  5:14 PM   Specimen: BLOOD RIGHT FOREARM  Result Value Ref Range Status   Specimen Description BLOOD RIGHT FOREARM  Final   Special Requests   Final    BOTTLES DRAWN AEROBIC ONLY Blood Culture results may not be optimal due to an inadequate volume of blood received in culture bottles   Culture  Setup Time   Final    GRAM NEGATIVE RODS AEROBIC BOTTLE ONLY CRITICAL VALUE NOTED.  VALUE IS CONSISTENT WITH PREVIOUSLY REPORTED AND CALLED VALUE. Performed at [facility], [address] [place], [place] [address]    Culture GRAM NEGATIVE RODS  Final   Report Status PENDING  Incomplete  Resp panel by RT-PCR (RSV, Flu A&B, Covid) Anterior Nasal Swab     Status: Abnormal   Collection Time: 04/07/24  5:21 PM   Specimen: Anterior Nasal Swab  Result Value Ref Range Status   SARS Coronavirus 2 by RT PCR POSITIVE (A) NEGATIVE Final   Influenza A by PCR NEGATIVE NEGATIVE Final   Influenza B by PCR NEGATIVE NEGATIVE Final    Comment: (NOTE) The Xpert Xpress SARS-CoV-2/FLU/RSV plus assay is intended as an aid in the diagnosis of influenza from Nasopharyngeal swab specimens and should not be used as a sole basis for treatment. Nasal washings and aspirates are unacceptable for Xpert Xpress SARS-CoV-2/FLU/RSV testing.  Fact Sheet for Patients: BloggerCourse.com  Fact Sheet for Healthcare Providers: SeriousBroker.it  This test is not yet approved or cleared by the [place]  FDA and has been authorized for detection and/or  diagnosis of SARS-CoV-2 by FDA under an Emergency Use Authorization (EUA). This EUA will remain in effect (meaning this test can be used) for the duration of the COVID-19 declaration under Section 564(b)(1) of the Act, 21 U.S.C. section 360bbb-3(b)(1), unless the authorization is terminated or revoked.     Resp Syncytial Virus by PCR NEGATIVE NEGATIVE Final    Comment: (NOTE) Fact Sheet for Patients: BloggerCourse.com  Fact Sheet for Healthcare Providers: SeriousBroker.it  This test is not yet approved or cleared by the [place]  FDA and has been authorized for detection and/or diagnosis of SARS-CoV-2 by FDA under an Emergency Use Authorization (EUA). This EUA will remain in effect (meaning this test can be used) for the duration of the COVID-19 declaration under Section 564(b)(1) of the Act, 21 U.S.C. section 360bbb-3(b)(1), unless the authorization is terminated or revoked.  Performed at [facility], [address] [place], [place] [address]   Urine Culture     Status: Abnormal   Collection Time: 04/07/24  8:38 PM   Specimen: Urine, Random  Result Value Ref Range Status   Specimen Description URINE, RANDOM  Final   Special Requests NONE Reflexed from [id number]  Final   Culture (A)  Final    <10,000 COLONIES/mL INSIGNIFICANT GROWTH Performed at [facility], [address] [place], [place] [address]    Report Status 04/08/2024 FINAL  Final  MRSA Next Gen by PCR, Nasal     Status: None   Collection Time: 04/07/24 11:14 PM   Specimen: Nasal Mucosa; Nasal Swab  Result Value Ref Range Status   MRSA by PCR Next Gen NOT DETECTED NOT DETECTED Final    Comment: (NOTE) The GeneXpert MRSA Assay (FDA approved for NASAL specimens only), is one component of a comprehensive MRSA colonization surveillance program. It is not intended to diagnose MRSA infection nor to guide or monitor treatment for MRSA infections. Test  performance is not FDA approved in patients less than 67 years old. Performed at [facility], [address] [place], [place] [address]          Radiology Studies: CT RENAL STONE STUDY Result Date: 04/07/2024 CLINICAL DATA:  Abdominal/flank pain.  Stone suspected. EXAM: CT ABDOMEN AND PELVIS WITHOUT CONTRAST TECHNIQUE: Multidetector CT imaging of the abdomen and pelvis was performed following the standard protocol without IV contrast. RADIATION DOSE REDUCTION: This exam was performed according to the departmental dose-optimization program which includes automated exposure control, adjustment of the mA and/or kV according to patient size and/or use of iterative reconstruction technique. COMPARISON:  CT without contrast 03/23/2024, PET-CT 01/29/2022. FINDINGS: Lower chest: Lung bases are emphysematous but clear. Small hiatal hernia. The heart is slightly enlarged. There are scattered three-vessel coronary calcifications. The aortic valve leaflets are heavily calcified. Interval resolved prior trace right pleural effusion. Hepatobiliary: Respiratory motion obscures fine detail. There is no obvious liver mass. Unremarkable gallbladder bile ducts, as visualized. Pancreas: No focal abnormality is seen through the motion artifact. Spleen: No focal abnormality is seen through the motion artifact. Adrenals/Urinary Tract: No adrenal mass. There are bilateral Bosniak 1 renal cysts, more on the left, largest of these is 4.7 cm and 0.9 Hounsfield units left upper pole, on the right the largest is 2.6 cm and 17 Hounsfield units in the superior pole. No follow-up imaging is recommended. There is asymmetric increased right perinephric stranding, small amount of fluid now seen collecting in the lower posterior right perirenal space. Mild hydroureteronephrosis continues to be seen on the right to the bladder base, without obstructing stone identified. No intrarenal stones are seen bilaterally. No nephrolithiasis  was seen on either prior study. Findings could indicate evidence of an ascending right-sided UTI with pyelonephritis, sequela of a recently passed stone, or a distal right ureteral stricture or neoplasm causing obstructive uropathy. The hydronephrosis has slightly increased since August 8. Again noted is mild bladder thickening versus underdistention. Correlate clinically for underlying cystitis. Stomach/Bowel: No dilatation or overt wall thickening. Again the cecum and appendix are contained in a moderate-sized right inguinal hernia sac through a defect of 2.9 cm on 3:64. No upstream obstruction is seen. The ileocecal junction is also within the hernia sac. There is mild-to-moderate fecal stasis in the distal 2/3 of the colon, increased retained stool in the rectum. No evidence of focal colitis or diverticulitis. Vascular/Lymphatic: Aortic atherosclerosis. No enlarged abdominal or pelvic lymph nodes. Reproductive: Prior prostate brachytherapy. No mass is seen in the prostate bed. Both testicles are in the scrotal sac. Other: Addition to a right inguinal hernia there is a small left inguinal fat hernia and a small umbilical fat hernia. There are no findings of hernia incarceration, no wall thickening in the cecum and terminal ileum within the hernia sac. There is trace posterior deep pelvic ascites. No free hemorrhage, free air or abscess. Musculoskeletal: Osteopenia and degenerative change lumbar spine with bilateral SI joint ankylosis. Chronic compression fractures again noted L1 and 2. There are bridging syndesmophytes in the lower thoracic spine. Does the patient have ankylosing spondylitis? No acute or other significant osseous findings. IMPRESSION: 1. Increased right hydroureteronephrosis to the bladder base, without obstructing stone identified. Findings could indicate evidence of an ascending right-sided UTI with pyelonephritis, sequela of a recently passed stone, or a distal right ureteral stricture or  neoplasm causing obstructive uropathy. 2. No urinary stone was seen on the current or prior studies. 3. Asymmetric increased right perinephric stranding and small amount of fluid in the lower posterior right perirenal space. 4. Cystitis versus bladder  nondistention. 5. Constipation and diverticulosis. 6. Right inguinal hernia containing the cecum, ileocecal junction and appendix, without evidence of incarceration or up strain obstruction. 7. Aortic and coronary artery atherosclerosis. 8. Emphysema. 9. Osteopenia and degenerative change with chronic compression fractures L1 and L2. 10. Bridging syndesmophytes in the lower thoracic spine and bilateral SI joint ankylosis. Does the patient have ankylosing spondylitis? Aortic Atherosclerosis (ICD10-I70.0) and Emphysema (ICD10-J43.9). Electronically Signed   By: Francis Quam M.D.   On: 04/07/2024 22:43   DG Chest Port 1 View Result Date: 04/07/2024 CLINICAL DATA:  Sepsis. EXAM: PORTABLE CHEST 1 VIEW COMPARISON:  03/19/2024 FINDINGS: Stable cardiomegaly. Mild scarring seen in both lung bases. No evidence of acute infiltrate or pleural effusion. IMPRESSION: Stable cardiomegaly and bibasilar scarring. No acute findings. Electronically Signed   By: Norleen DELENA Kil M.D.   On: 04/07/2024 17:36        Scheduled Meds:  latanoprost   1 drop Both Eyes QHS   pantoprazole  (PROTONIX ) IV  40 mg Intravenous Q24H   sodium chloride  flush  3 mL Intravenous Q12H   Continuous Infusions:  sodium chloride  125 mL/hr at 04/09/24 0550   cefTRIAXone  (ROCEPHIN )  IV Stopped (04/08/24 9061)          Sophie Mao, MD [facility] 04/09/2024, 7:19 AM

## 2024-04-09 NOTE — TOC Initial Note (Signed)
 Transition of Care [facility]) - Initial/Assessment Note    Patient Details  Name: Connor Morris MRN: [id number] Date of Birth: Dec 28, 1933  Transition of Care [facility]) CM/SW Contact:    Lauraine FORBES Saa, LCSWA Phone Number: 04/09/2024, 3:32 PM  Clinical Narrative:                  3:32 PM CSW attempted to discuss therapy recommendation of patient discharging to SNF with patient's daughter, [name] (patient is not fully oriented). There was no response and a voicemail was left. Per chart review, patient has a PCP and insurance. Patient has SNF history with [facility]. Patient has HH history with [facility]. Patient has DME (cane, rolling walker, BSC) history. Patient's preferred pharmacy is [facility] [phone] [facility]. CSW will continue to follow.  Expected Discharge Plan: Skilled Nursing Facility Barriers to Discharge: Continued Medical Work up   Patient Goals and CMS Choice            Expected Discharge Plan and Services In-house Referral: Clinical Social Work     Living arrangements for the past 2 months: Single Family Home                                      Prior Living Arrangements/Services Living arrangements for the past 2 months: Single Family Home Lives with:: Adult Children Patient language and need for interpreter reviewed:: Yes        Need for Family Participation in Patient Care: Yes (Comment)   Current home services: DME, Homehealth aide, Home RN, Home PT, Home OT, Other (comment) (Home SW) Criminal Activity/Legal Involvement Pertinent to Current Situation/Hospitalization: No - Comment as needed  Activities of Daily Living   ADL Screening (condition at time of admission) Independently performs ADLs?: No (was able to walk around with a cane at home.) Does the patient have a NEW difficulty with bathing/dressing/toileting/self-feeding that is expected to last >3 days?: Yes (Initiates electronic notice to provider for possible OT consult) Does the patient have a  NEW difficulty with getting in/out of bed, walking, or climbing stairs that is expected to last >3 days?: Yes (Initiates electronic notice to provider for possible PT consult) Does the patient have a NEW difficulty with communication that is expected to last >3 days?: Yes (Initiates electronic notice to provider for possible SLP consult) Is the patient deaf or have difficulty hearing?: Yes (Hard of hearing) Does the patient have difficulty seeing, even when wearing glasses/contacts?: No Does the patient have difficulty concentrating, remembering, or making decisions?: Yes  Permission Sought/Granted Permission sought to share information with : Family Supports Permission granted to share information with : No (Contact information on chart)  Share Information with NAME: Jae Skeet     Permission granted to share info w Relationship: Daughter  Permission granted to share info w Contact Information: [phone]  Emotional Assessment   Attitude/Demeanor/Rapport: Unable to Assess Affect (typically observed): Unable to Assess Orientation: : Oriented to Self Alcohol  / Substance Use: Not Applicable Psych Involvement: No (comment)  Admission diagnosis:  Acute cystitis with hematuria [N30.01] Sepsis secondary to UTI (HCC) [A41.9, N39.0] Sepsis with acute renal failure without septic shock, due to unspecified organism, unspecified acute renal failure type (HCC) [A41.9, R65.20, N17.9] Patient Active Problem List   Diagnosis Date Noted   Severe sepsis (HCC) 04/07/2024   Coagulopathy (HCC) 04/07/2024   Syncope 03/20/2024   Hyponatremia 03/20/2024   COVID 03/20/2024   Aspiration  pneumonia (HCC) 03/19/2024   Dysphagia, unspecified 08/04/2022   Encephalopathy acute 08/04/2022   Hypotension 08/04/2022   Hardening of the aorta (main artery of the heart) (HCC) 08/04/2022   Primary hypertension 08/04/2022   Pulmonary emphysema (HCC) 08/04/2022   Complicated UTI (urinary tract infection)  03/27/2022   Current use of long term anticoagulation 03/27/2022   Dementia without behavioral disturbance (HCC) 03/27/2022   AMS (altered mental status) 03/27/2022   Acute renal failure superimposed on stage 3a chronic kidney disease (HCC) 03/26/2022   Malnutrition of moderate degree 03/08/2022   CAP (community acquired pneumonia) 03/08/2022   PNA (pneumonia) 03/07/2022   Dehydration 03/07/2022   Generalized weakness 03/07/2022   Failure to thrive in adult 03/07/2022   Hematuria 03/25/2020   Amnesia 02/11/2020   Problem related to unspecified psychosocial circumstances 02/11/2020   Protein calorie malnutrition (HCC) 01/31/2020   Sepsis secondary to UTI (HCC) 01/30/2020   History of pulmonary embolism 01/30/2020   Acute metabolic encephalopathy 01/30/2020   Bladder cancer (HCC)    Vitamin D  deficiency 06/04/2019   Non-thrombocytopenic purpura (HCC) 05/30/2018   Acute pulmonary embolism without acute cor pulmonale (HCC) 04/05/2018   History of prostate cancer 04/05/2018   Thoracic ascending aortic aneurysm (HCC) 04/05/2018   Sinus bradycardia 09/16/2016   Shortness of breath 09/16/2016   Bradycardia 09/14/2016   1st degree AV block 09/14/2016   Dizziness 09/14/2016   Balanitis 09/07/2016   Disequilibrium 09/07/2016   Elevated blood-pressure reading without diagnosis of hypertension 09/07/2016   Disorder of bone 12/25/2015   Nicotine dependence 12/13/2013   Elevated PSA 04/20/2013   Hyperlipidemia 04/20/2013   Unspecified inflammatory spondylopathy, lumbar region (HCC) 04/20/2013   PCP:  Larnell Hamilton, MD Pharmacy:   [facility] [phone] - [place], [place] - [address] AT [facility] [address] [place] [address] Phone: [phone] Fax: [phone]     Social Drivers of Health (SDOH) Social History: SDOH Screenings   Food Insecurity: No Food Insecurity (04/07/2024)  Housing: Low Risk  (04/07/2024)  Transportation  Needs: No Transportation Needs (04/07/2024)  Utilities: Not At Risk (04/07/2024)  Social Connections: Socially Isolated (04/07/2024)  Tobacco Use: Medium Risk (04/07/2024)   SDOH Interventions:     Readmission Risk Interventions    03/20/2024    3:34 PM 03/10/2022    9:39 AM  Readmission Risk Prevention Plan  Post Dischage Appt  Complete  Medication Screening  Complete  Transportation Screening Complete Complete  PCP or Specialist Appt within 5-7 Days Complete   Home Care Screening Complete   Medication Review (RN CM) Complete

## 2024-04-10 DIAGNOSIS — N390 Urinary tract infection, site not specified: Secondary | ICD-10-CM | POA: Diagnosis not present

## 2024-04-10 DIAGNOSIS — A419 Sepsis, unspecified organism: Secondary | ICD-10-CM | POA: Diagnosis not present

## 2024-04-10 LAB — CULTURE, BLOOD (ROUTINE X 2): Special Requests: ADEQUATE

## 2024-04-10 LAB — BASIC METABOLIC PANEL WITH GFR
Anion gap: 8 (ref 5–15)
BUN: 21 mg/dL (ref 8–23)
CO2: 20 mmol/L — ABNORMAL LOW (ref 22–32)
Calcium: 9.1 mg/dL (ref 8.9–10.3)
Chloride: 111 mmol/L (ref 98–111)
Creatinine, Ser: 1.21 mg/dL (ref 0.61–1.24)
GFR, Estimated: 58 mL/min — ABNORMAL LOW (ref >60)
Glucose, Bld: 104 mg/dL — ABNORMAL HIGH (ref 70–99)
Potassium: 4.5 mmol/L (ref 3.5–5.1)
Sodium: 139 mmol/L (ref 135–145)

## 2024-04-10 LAB — CBC
HCT: 30.5 % — ABNORMAL LOW (ref 39.0–52.0)
Hemoglobin: 9.4 g/dL — ABNORMAL LOW (ref 13.0–17.0)
MCH: 27.6 pg (ref 26.0–34.0)
MCHC: 30.8 g/dL (ref 30.0–36.0)
MCV: 89.7 fL (ref 80.0–100.0)
Platelets: 171 K/uL (ref 150–400)
RBC: 3.4 MIL/uL — ABNORMAL LOW (ref 4.22–5.81)
RDW: 14.5 % (ref 11.5–15.5)
WBC: 4.6 K/uL (ref 4.0–10.5)
nRBC: 0 % (ref 0.0–0.2)

## 2024-04-10 LAB — PROTIME-INR
INR: 1.4 — ABNORMAL HIGH (ref 0.8–1.2)
Prothrombin Time: 18.3 s — ABNORMAL HIGH (ref 11.4–15.2)

## 2024-04-10 LAB — MAGNESIUM: Magnesium: 1.8 mg/dL (ref 1.7–2.4)

## 2024-04-10 MED ORDER — PANTOPRAZOLE SODIUM 40 MG PO TBEC
40.0000 mg | DELAYED_RELEASE_TABLET | Freq: Every day | ORAL | Status: DC
  Administered 2024-04-11 – 2024-04-13 (×3): 40 mg via ORAL
  Filled 2024-04-10 (×3): qty 1

## 2024-04-10 MED ORDER — HYDRALAZINE HCL 25 MG PO TABS
25.0000 mg | ORAL_TABLET | Freq: Four times a day (QID) | ORAL | Status: DC | PRN
  Administered 2024-04-11: 25 mg via ORAL
  Filled 2024-04-10: qty 1

## 2024-04-10 MED ORDER — CEFADROXIL 500 MG PO CAPS
1000.0000 mg | ORAL_CAPSULE | Freq: Two times a day (BID) | ORAL | Status: DC
  Administered 2024-04-10 – 2024-04-13 (×7): 1000 mg via ORAL
  Filled 2024-04-10 (×9): qty 2

## 2024-04-10 MED ORDER — SODIUM CHLORIDE (PF) 0.9 % IJ SOLN
INTRAMUSCULAR | Status: AC
  Administered 2024-04-10: 10 mL
  Filled 2024-04-10: qty 10

## 2024-04-10 MED ORDER — RIVAROXABAN 20 MG PO TABS
20.0000 mg | ORAL_TABLET | Freq: Every day | ORAL | Status: DC
  Administered 2024-04-10 – 2024-04-12 (×3): 20 mg via ORAL
  Filled 2024-04-10 (×3): qty 1

## 2024-04-10 MED ORDER — AMLODIPINE BESYLATE 5 MG PO TABS
5.0000 mg | ORAL_TABLET | Freq: Every day | ORAL | Status: DC
  Administered 2024-04-10 – 2024-04-13 (×4): 5 mg via ORAL
  Filled 2024-04-10 (×4): qty 1

## 2024-04-10 MED ORDER — RIVAROXABAN 20 MG PO TABS
20.0000 mg | ORAL_TABLET | Freq: Every day | ORAL | Status: DC
  Filled 2024-04-10: qty 1

## 2024-04-10 MED ORDER — RIVAROXABAN 20 MG PO TABS: 20.0000 mg | ORAL_TABLET | Freq: Every morning | ORAL | Status: DC

## 2024-04-10 NOTE — Plan of Care (Signed)
  Problem: Fluid Volume: Goal: Hemodynamic stability will improve Outcome: Progressing   Problem: Clinical Measurements: Goal: Respiratory complications will improve Outcome: Progressing   Problem: Nutrition: Goal: Adequate nutrition will be maintained Outcome: Progressing   Problem: Elimination: Goal: Will not experience complications related to urinary retention Outcome: Progressing   Problem: Pain Managment: Goal: General experience of comfort will improve and/or be controlled Outcome: Progressing

## 2024-04-10 NOTE — Progress Notes (Signed)
 PROGRESS NOTE    Connor Morris  FMW:[id number] DOB: February 26, 1934 DOA: 04/07/2024 PCP: Larnell Hamilton, MD   Brief Narrative:  89 y.o. male with medical history significant for hypertension, history of DVT and PE on Xarelto , bladder cancer status post TURBT, and dementia presented with lethargy, shaking chills and loss of appetite along with fever.  On presentation, he was febrile.  Creatinine was 1.8, WBC of 16,000, lactic acid of 3.6 and INR of 6.1.  UA was suggestive of UTI.  Chest x-ray negative for acute findings.  He was started on IV fluids and antibiotics.  Palliative care consulted for goals of care discussion.  PT recommended SNF placement.  TOC consulted.  Assessment & Plan:   Septic shock: Present on admission UTI: Present on admission E. coli bacteremia Lactic acidosis: Resolved -Presented with fever, tachypnea, leukocytosis, lactic acidosis with lactic acid of more than 4, AKI, encephalopathy and coagulopathy with evidence of UTI - Blood cultures positive for E. coli.  Follow sensitivities.  Currently on IV Rocephin .  Urine cultures grew less than 10,000 colonies per mL of insignificant growth - Decrease IV fluids to 50 cc an hour. - No temperature spikes over the last 48 hours.  AKI superimposed on CKD stage IIIa - Creatinine 1.8 on presentation.  Improving to 1.21.  IV fluids as above  Acute metabolic acidosis -Mild.  Monitor  Leukocytosis - Resolved  Anemia of chronic disease -From chronic illnesses.  Hemoglobin is stable.  Monitor intermittently  Thrombocytopenia - Resolved  Hyponatremia -Resolved  Hypoalbuminemia - From poor oral intake.  Encourage oral intake.  Consult nutrition.  History of DVT and PE Supratherapeutic INR - Xarelto  on hold for now.  INR 6.1 on presentation; improving to 1.4 today.  Resume Xarelto .  Acute metabolic encephalopathy: Possibly from shock/UTI/bacteremia Dementia -Mental status improving and currently possibly back to  baseline.  Delirium precautions.  Fall precautions.   -PT recommends SNF placement.  TOC consult   hypertension --blood pressure intermittently on the higher side now.  Resume amlodipine .    GERD -Continue PPI  Recent COVID - Tested positive for COVID on 03/21/2019 2:05 days of symptoms and was treated with remdesivir  and Decadron .  Continues to test positive on presentation: No longer requires ambulation  History of bladder cancer Ascending aortic aneurysm -Outpatient follow-up    DVT prophylaxis: SCDs Code Status: DNR Family Communication: None at bedside Disposition Plan: Status is: Inpatient Remains inpatient appropriate because: Of severity of illness.  Need for IV antibiotics and SNF placement.    Consultants: Palliative care  Procedures: None  Antimicrobials:  Anti-infectives (From admission, onward)    Start     Dose/Rate Route Frequency Ordered Stop   04/08/24 1000  cefTRIAXone  (ROCEPHIN ) 2 g in sodium chloride  0.9 % 100 mL IVPB        2 g 200 mL/hr over 30 Minutes Intravenous Every 24 hours 04/08/24 0722     04/08/24 0600  ceFEPIme  (MAXIPIME ) 2 g in sodium chloride  0.9 % 100 mL IVPB  Status:  Discontinued        2 g 200 mL/hr over 30 Minutes Intravenous Every 12 hours 04/07/24 2129 04/08/24 0722   04/07/24 1800  ceFEPIme  (MAXIPIME ) 2 g in sodium chloride  0.9 % 100 mL IVPB        2 g 200 mL/hr over 30 Minutes Intravenous  Once 04/07/24 1752 04/07/24 1900   04/07/24 1800  metroNIDAZOLE  (FLAGYL ) IVPB 500 mg        500 mg 100 mL/hr  over 60 Minutes Intravenous  Once 04/07/24 1752 04/07/24 1949        Subjective: Patient seen and examined at bedside.  Poor historian.  No fever, vomiting, seizures, agitation reported.   Objective: Vitals:   04/09/24 1044 04/09/24 1702 04/09/24 1933 04/10/24 0400  BP: (!) 114/58 (!) 147/61 (!) 100/58 (!) 179/65  Pulse:   71   Resp: 15 17 18 20   Temp: 97.6 F (36.4 C) 97.7 F (36.5 C) 97.9 F (36.6 C)   TempSrc: Oral  Oral Oral   SpO2: 97% 97% 96% 95%  Weight:    74.3 kg  Height:        Intake/Output Summary (Last 24 hours) at 04/10/2024 0710 Last data filed at 04/10/2024 0540 Gross per 24 hour  Intake 2773.8 ml  Output 600 ml  Net 2173.8 ml   Filed Weights   04/08/24 0500 04/09/24 0458 04/10/24 0400  Weight: 71.2 kg 70.4 kg 74.3 kg    Examination:  General: No acute distress.  Remains on room air.  Chronically ill and deconditioned looking. ENT/neck: No palpable neck masses or JVD elevation noted respiratory: Bilateral decreased breath sounds at bases with scattered crackles CVS: Rate mostly controlled; S1-S2 heard  abdominal: Soft, nontender, distended mildly; no organomegaly,  bowel sounds are heard normally Extremities: No clubbing; mild lower extremity edema present CNS: Awake, still slightly confused and slow to respond.  Poor historian.  No focal neurologic deficit.  Able to move extremities Lymph: No obvious palpable lymphadenopathy Skin: No obvious petechiae/rashes psych: Showing no signs of agitation.  Affect is flat currently.  musculoskeletal: No obvious joint tenderness/erythema    Data Reviewed: I have personally reviewed following labs and imaging studies  CBC: Recent Labs  Lab 04/07/24 1638 04/08/24 0322 04/09/24 0228 04/10/24 0239  WBC 16.0* 12.6* 6.4 4.6  NEUTROABS 14.0*  --   --   --   HGB 11.6* 9.1* 9.6* 9.4*  HCT 37.5* 28.9* 30.5* 30.5*  MCV 92.4 88.4 88.9 89.7  PLT 216 159 149* 171   Basic Metabolic Panel: Recent Labs  Lab 04/07/24 1933 04/08/24 0322 04/09/24 0228 04/10/24 0239  NA 134* 135 137 139  K 5.1 4.5 4.4 4.5  CL 102 105 106 111  CO2 23 22 22  20*  GLUCOSE 101* 104* 95 104*  BUN 29* 30* 27* 21  CREATININE 1.80* 1.63* 1.31* 1.21  CALCIUM 9.5 8.8* 8.8* 9.1  MG  --   --  1.7 1.8   GFR: Estimated Creatinine Clearance: 42.2 mL/min (by C-G formula based on SCr of 1.21 mg/dL). Liver Function Tests: Recent Labs  Lab 04/07/24 1933  04/08/24 0322  AST 20 16  ALT 12 10  ALKPHOS 40 34*  BILITOT 1.4* 1.4*  PROT 7.1 5.7*  ALBUMIN 2.8* 2.2*   No results for input(s): LIPASE, AMYLASE in the last 168 hours. No results for input(s): AMMONIA in the last 168 hours. Coagulation Profile: Recent Labs  Lab 04/07/24 1638 04/08/24 0322 04/10/24 0239  INR 6.1* 4.8* 1.4*   Cardiac Enzymes: No results for input(s): CKTOTAL, CKMB, CKMBINDEX, TROPONINI in the last 168 hours. BNP (last 3 results) No results for input(s): PROBNP in the last 8760 hours. HbA1C: No results for input(s): HGBA1C in the last 72 hours. CBG: Recent Labs  Lab 04/08/24 1421  GLUCAP 89   Lipid Profile: No results for input(s): CHOL, HDL, LDLCALC, TRIG, CHOLHDL, LDLDIRECT in the last 72 hours. Thyroid  Function Tests: No results for input(s): TSH, T4TOTAL, FREET4, T3FREE, THYROIDAB  in the last 72 hours. Anemia Panel: No results for input(s): VITAMINB12, FOLATE, FERRITIN, TIBC, IRON, RETICCTPCT in the last 72 hours. Sepsis Labs: Recent Labs  Lab 04/07/24 1733 04/07/24 1938 04/07/24 2304 04/08/24 0322  LATICACIDVEN 3.6* 2.4* 4.3* 1.7    Recent Results (from the past 240 hours)  Blood Culture (routine x 2)     Status: Abnormal (Preliminary result)   Collection Time: 04/07/24  5:00 PM   Specimen: BLOOD RIGHT HAND  Result Value Ref Range Status   Specimen Description BLOOD RIGHT HAND  Final   Special Requests   Final    BOTTLES DRAWN AEROBIC AND ANAEROBIC Blood Culture adequate volume   Culture  Setup Time   Final    GRAM NEGATIVE RODS IN BOTH AEROBIC AND ANAEROBIC BOTTLES Organism ID to follow CRITICAL RESULT CALLED TO, READ BACK BY AND VERIFIED WITH: PHARMD J LEDFORD 04/08/2024 @ 0701 BY AB    Culture (A)  Final    ESCHERICHIA COLI SUSCEPTIBILITIES TO FOLLOW Performed at [facility], [address] [place], [place] [address]    Report Status PENDING  Incomplete  Blood  Culture ID Panel (Reflexed)     Status: Abnormal   Collection Time: 04/07/24  5:00 PM  Result Value Ref Range Status   Enterococcus faecalis NOT DETECTED NOT DETECTED Final   Enterococcus Faecium NOT DETECTED NOT DETECTED Final   Listeria monocytogenes NOT DETECTED NOT DETECTED Final   Staphylococcus species NOT DETECTED NOT DETECTED Final   Staphylococcus aureus (BCID) NOT DETECTED NOT DETECTED Final   Staphylococcus epidermidis NOT DETECTED NOT DETECTED Final   Staphylococcus lugdunensis NOT DETECTED NOT DETECTED Final   Streptococcus species NOT DETECTED NOT DETECTED Final   Streptococcus agalactiae NOT DETECTED NOT DETECTED Final   Streptococcus pneumoniae NOT DETECTED NOT DETECTED Final   Streptococcus pyogenes NOT DETECTED NOT DETECTED Final   A.calcoaceticus-baumannii NOT DETECTED NOT DETECTED Final   Bacteroides fragilis NOT DETECTED NOT DETECTED Final   Enterobacterales DETECTED (A) NOT DETECTED Final    Comment: Enterobacterales represent a large order of gram negative bacteria, not a single organism. CRITICAL RESULT CALLED TO, READ BACK BY AND VERIFIED WITH: PHARMD J LEDFORD 04/08/2024 @ 0701 BY AB    Enterobacter cloacae complex NOT DETECTED NOT DETECTED Final   Escherichia coli DETECTED (A) NOT DETECTED Final    Comment: CRITICAL RESULT CALLED TO, READ BACK BY AND VERIFIED WITH: PHARMD J LEDFORD 04/08/2024 @ 0701 BY AB    Klebsiella aerogenes NOT DETECTED NOT DETECTED Final   Klebsiella oxytoca NOT DETECTED NOT DETECTED Final   Klebsiella pneumoniae NOT DETECTED NOT DETECTED Final   Proteus species NOT DETECTED NOT DETECTED Final   Salmonella species NOT DETECTED NOT DETECTED Final   Serratia marcescens NOT DETECTED NOT DETECTED Final   Haemophilus influenzae NOT DETECTED NOT DETECTED Final   Neisseria meningitidis NOT DETECTED NOT DETECTED Final   Pseudomonas aeruginosa NOT DETECTED NOT DETECTED Final   Stenotrophomonas maltophilia NOT DETECTED NOT DETECTED Final    Candida albicans NOT DETECTED NOT DETECTED Final   Candida auris NOT DETECTED NOT DETECTED Final   Candida glabrata NOT DETECTED NOT DETECTED Final   Candida krusei NOT DETECTED NOT DETECTED Final   Candida parapsilosis NOT DETECTED NOT DETECTED Final   Candida tropicalis NOT DETECTED NOT DETECTED Final   Cryptococcus neoformans/gattii NOT DETECTED NOT DETECTED Final   CTX-M ESBL NOT DETECTED NOT DETECTED Final   Carbapenem resistance IMP NOT DETECTED NOT DETECTED Final   Carbapenem resistance  KPC NOT DETECTED NOT DETECTED Final   Carbapenem resistance NDM NOT DETECTED NOT DETECTED Final   Carbapenem resist OXA 48 LIKE NOT DETECTED NOT DETECTED Final   Carbapenem resistance VIM NOT DETECTED NOT DETECTED Final    Comment: Performed at [facility], [address], [place], [place] [address]  Blood Culture (routine x 2)     Status: None (Preliminary result)   Collection Time: 04/07/24  5:14 PM   Specimen: BLOOD RIGHT FOREARM  Result Value Ref Range Status   Specimen Description BLOOD RIGHT FOREARM  Final   Special Requests   Final    BOTTLES DRAWN AEROBIC ONLY Blood Culture results may not be optimal due to an inadequate volume of blood received in culture bottles   Culture  Setup Time   Final    GRAM NEGATIVE RODS AEROBIC BOTTLE ONLY CRITICAL VALUE NOTED.  VALUE IS CONSISTENT WITH PREVIOUSLY REPORTED AND CALLED VALUE. Performed at [facility], [address] [place], [place] [address]    Culture GRAM NEGATIVE RODS  Final   Report Status PENDING  Incomplete  Resp panel by RT-PCR (RSV, Flu A&B, Covid) Anterior Nasal Swab     Status: Abnormal   Collection Time: 04/07/24  5:21 PM   Specimen: Anterior Nasal Swab  Result Value Ref Range Status   SARS Coronavirus 2 by RT PCR POSITIVE (A) NEGATIVE Final   Influenza A by PCR NEGATIVE NEGATIVE Final   Influenza B by PCR NEGATIVE NEGATIVE Final    Comment: (NOTE) The Xpert Xpress SARS-CoV-2/FLU/RSV plus assay is intended as an  aid in the diagnosis of influenza from Nasopharyngeal swab specimens and should not be used as a sole basis for treatment. Nasal washings and aspirates are unacceptable for Xpert Xpress SARS-CoV-2/FLU/RSV testing.  Fact Sheet for Patients: BloggerCourse.com  Fact Sheet for Healthcare Providers: SeriousBroker.it  This test is not yet approved or cleared by the [place]  FDA and has been authorized for detection and/or diagnosis of SARS-CoV-2 by FDA under an Emergency Use Authorization (EUA). This EUA will remain in effect (meaning this test can be used) for the duration of the COVID-19 declaration under Section 564(b)(1) of the Act, 21 U.S.C. section 360bbb-3(b)(1), unless the authorization is terminated or revoked.     Resp Syncytial Virus by PCR NEGATIVE NEGATIVE Final    Comment: (NOTE) Fact Sheet for Patients: BloggerCourse.com  Fact Sheet for Healthcare Providers: SeriousBroker.it  This test is not yet approved or cleared by the [place]  FDA and has been authorized for detection and/or diagnosis of SARS-CoV-2 by FDA under an Emergency Use Authorization (EUA). This EUA will remain in effect (meaning this test can be used) for the duration of the COVID-19 declaration under Section 564(b)(1) of the Act, 21 U.S.C. section 360bbb-3(b)(1), unless the authorization is terminated or revoked.  Performed at [facility], [address] [place], [place] [address]   Urine Culture     Status: Abnormal   Collection Time: 04/07/24  8:38 PM   Specimen: Urine, Random  Result Value Ref Range Status   Specimen Description URINE, RANDOM  Final   Special Requests NONE Reflexed from [id number]  Final   Culture (A)  Final    <10,000 COLONIES/mL INSIGNIFICANT GROWTH Performed at [facility], [address] [place], [place] [address]    Report Status 04/08/2024 FINAL   Final  MRSA Next Gen by PCR, Nasal     Status: None   Collection Time: 04/07/24 11:14  PM   Specimen: Nasal Mucosa; Nasal Swab  Result Value Ref Range Status   MRSA by PCR Next Gen NOT DETECTED NOT DETECTED Final    Comment: (NOTE) The GeneXpert MRSA Assay (FDA approved for NASAL specimens only), is one component of a comprehensive MRSA colonization surveillance program. It is not intended to diagnose MRSA infection nor to guide or monitor treatment for MRSA infections. Test performance is not FDA approved in patients less than 19 years old. Performed at [facility], [address], [place], [place] [address]          Radiology Studies: No results found.       Scheduled Meds:  feeding supplement  237 mL Oral TID BM   latanoprost   1 drop Both Eyes QHS   multivitamin with minerals  1 tablet Oral Daily   pantoprazole  (PROTONIX ) IV  40 mg Intravenous Q24H   sodium chloride  flush  3 mL Intravenous Q12H   Continuous Infusions:  sodium chloride  100 mL/hr at 04/10/24 0558   cefTRIAXone  (ROCEPHIN )  IV Stopped (04/09/24 1005)          Sophie Mao, MD [facility] 04/10/2024, 7:10 AM

## 2024-04-10 NOTE — Plan of Care (Signed)
  Problem: Education: Goal: Knowledge of risk factors and measures for prevention of condition will improve Outcome: Progressing   Problem: Coping: Goal: Psychosocial and spiritual needs will be supported Outcome: Progressing   Problem: Respiratory: Goal: Will maintain a patent airway Outcome: Progressing Goal: Complications related to the disease process, condition or treatment will be avoided or minimized Outcome: Progressing   Problem: Fluid Volume: Goal: Hemodynamic stability will improve Outcome: Progressing   Problem: Clinical Measurements: Goal: Diagnostic test results will improve Outcome: Progressing Goal: Signs and symptoms of infection will decrease Outcome: Progressing   Problem: Respiratory: Goal: Ability to maintain adequate ventilation will improve Outcome: Progressing   Problem: Education: Goal: Knowledge of General Education information will improve Description: Including pain rating scale, medication(s)/side effects and non-pharmacologic comfort measures Outcome: Progressing   Problem: Health Behavior/Discharge Planning: Goal: Ability to manage health-related needs will improve Outcome: Progressing   Problem: Clinical Measurements: Goal: Ability to maintain clinical measurements within normal limits will improve Outcome: Progressing Goal: Will remain free from infection Outcome: Progressing Goal: Diagnostic test results will improve Outcome: Progressing Goal: Respiratory complications will improve Outcome: Progressing Goal: Cardiovascular complication will be avoided Outcome: Progressing   Problem: Activity: Goal: Risk for activity intolerance will decrease Outcome: Progressing   Problem: Nutrition: Goal: Adequate nutrition will be maintained Outcome: Progressing   Problem: Coping: Goal: Level of anxiety will decrease Outcome: Progressing   Problem: Elimination: Goal: Will not experience complications related to bowel motility Outcome:  Progressing Goal: Will not experience complications related to urinary retention Outcome: Progressing   Problem: Pain Managment: Goal: General experience of comfort will improve and/or be controlled Outcome: Progressing   Problem: Safety: Goal: Ability to remain free from injury will improve Outcome: Progressing   Problem: Skin Integrity: Goal: Risk for impaired skin integrity will decrease Outcome: Progressing

## 2024-04-10 NOTE — [place] FL2 (Signed)
 [place]  MEDICAID FL2 LEVEL OF CARE FORM     IDENTIFICATION  Patient Name: Connor Morris Birthdate: 25-Jul-1934 Sex: male Admission Date (Current Location): 04/07/2024  [facility] and [place] Number:  Producer, television/film/video and Address:  [facility] [place], [address] [place], [place] [address]      Provider Number: [id number]  Attending Physician Name and Address:  Cheryle Page, MD  Relative Name and Phone Number:  Bassem Bernasconi; Daughter; [phone]    Current Level of Care: Hospital Recommended Level of Care: Skilled Nursing Facility Prior Approval Number:    Date Approved/Denied:   PASRR Number: [id number]  Discharge Plan: SNF    Current Diagnoses: Patient Active Problem List   Diagnosis Date Noted   Severe sepsis (HCC) 04/07/2024   Coagulopathy (HCC) 04/07/2024   Syncope 03/20/2024   Hyponatremia 03/20/2024   COVID 03/20/2024   Aspiration pneumonia (HCC) 03/19/2024   Dysphagia, unspecified 08/04/2022   Encephalopathy acute 08/04/2022   Hypotension 08/04/2022   Hardening of the aorta (main artery of the heart) (HCC) 08/04/2022   Primary hypertension 08/04/2022   Pulmonary emphysema (HCC) 08/04/2022   Complicated UTI (urinary tract infection) 03/27/2022   Current use of long term anticoagulation 03/27/2022   Dementia without behavioral disturbance (HCC) 03/27/2022   AMS (altered mental status) 03/27/2022   Acute renal failure superimposed on stage 3a chronic kidney disease (HCC) 03/26/2022   Malnutrition of moderate degree 03/08/2022   CAP (community acquired pneumonia) 03/08/2022   PNA (pneumonia) 03/07/2022   Dehydration 03/07/2022   Generalized weakness 03/07/2022   Failure to thrive in adult 03/07/2022   Hematuria 03/25/2020   Amnesia 02/11/2020   Problem related to unspecified psychosocial circumstances 02/11/2020   Protein calorie malnutrition (HCC) 01/31/2020   Sepsis secondary to UTI (HCC) 01/30/2020   History of pulmonary  embolism 01/30/2020   Acute metabolic encephalopathy 01/30/2020   Bladder cancer (HCC)    Vitamin D  deficiency 06/04/2019   Non-thrombocytopenic purpura (HCC) 05/30/2018   Acute pulmonary embolism without acute cor pulmonale (HCC) 04/05/2018   History of prostate cancer 04/05/2018   Thoracic ascending aortic aneurysm (HCC) 04/05/2018   Sinus bradycardia 09/16/2016   Shortness of breath 09/16/2016   Bradycardia 09/14/2016   1st degree AV block 09/14/2016   Dizziness 09/14/2016   Balanitis 09/07/2016   Disequilibrium 09/07/2016   Elevated blood-pressure reading without diagnosis of hypertension 09/07/2016   Disorder of bone 12/25/2015   Nicotine dependence 12/13/2013   Elevated PSA 04/20/2013   Hyperlipidemia 04/20/2013   Unspecified inflammatory spondylopathy, lumbar region (HCC) 04/20/2013    Orientation RESPIRATION BLADDER Height & Weight     Self  Normal (Room Air) Incontinent, External catheter Weight: 163 lb 12.8 oz (74.3 kg) Height:  5' 9 (175.3 cm)  BEHAVIORAL SYMPTOMS/MOOD NEUROLOGICAL BOWEL NUTRITION STATUS      Continent Diet (Please see discharge summary)  AMBULATORY STATUS COMMUNICATION OF NEEDS Skin   Extensive Assist Verbally Other (Comment) (Wound 04/07/24 2230 Other (Comment) Coccyx Right;Left)                       Personal Care Assistance Level of Assistance  Bathing, Feeding, Dressing Bathing Assistance: Maximum assistance Feeding assistance: Maximum assistance Dressing Assistance: Maximum assistance     Functional Limitations Info  Sight Sight Info: Impaired (R and L)        SPECIAL CARE FACTORS FREQUENCY  PT (By licensed PT), OT (By licensed OT)     PT Frequency: 5x OT  Frequency: 5x            Contractures Contractures Info: Not present    Additional Factors Info  Code Status, Allergies Code Status Info: DNR-LIMITED -Do Not Intubate/DNI Allergies Info: Aspirin            Current Medications (04/10/2024):  This is the current  hospital active medication list Current Facility-Administered Medications  Medication Dose Route Frequency Provider Last Rate Last Admin   0.9 %  sodium chloride  infusion   Intravenous Continuous Cheryle Page, MD 50 mL/hr at 04/10/24 1233 Rate Change at 04/10/24 1233   acetaminophen  (TYLENOL ) tablet 650 mg  650 mg Oral Q6H PRN Opyd, Timothy S, MD   650 mg at 04/08/24 1304   Or   acetaminophen  (TYLENOL ) suppository 650 mg  650 mg Rectal Q6H PRN Opyd, Evalene RAMAN, MD       amLODipine  (NORVASC ) tablet 5 mg  5 mg Oral Daily Cheryle Page, MD   5 mg at 04/10/24 1234   cefadroxil  (DURICEF) capsule 1,000 mg  1,000 mg Oral BID Cheryle Page, MD   1,000 mg at 04/10/24 1239   feeding supplement (ENSURE PLUS HIGH PROTEIN) liquid 237 mL  237 mL Oral TID BM Cheryle Page, MD   237 mL at 04/10/24 1234   hydrALAZINE  (APRESOLINE ) tablet 25 mg  25 mg Oral Q6H PRN Cheryle Page, MD       latanoprost  (XALATAN ) 0.005 % ophthalmic solution 1 drop  1 drop Both Eyes QHS Opyd, Evalene RAMAN, MD   1 drop at 04/09/24 2137   multivitamin with minerals tablet 1 tablet  1 tablet Oral Daily Cheryle Page, MD   1 tablet at 04/10/24 1234   [START ON 04/11/2024] pantoprazole  (PROTONIX ) EC tablet 40 mg  40 mg Oral Daily Alekh, Kshitiz, MD       prochlorperazine  (COMPAZINE ) injection 5 mg  5 mg Intravenous Q6H PRN Opyd, Evalene RAMAN, MD       rivaroxaban  (XARELTO ) tablet 20 mg  20 mg Oral Q supper Cheryle, Kshitiz, MD       senna (SENOKOT) tablet 8.6 mg  1 tablet Oral Daily PRN Opyd, Timothy S, MD       sodium chloride  flush (NS) 0.9 % injection 3 mL  3 mL Intravenous Q12H Opyd, Timothy S, MD   3 mL at 04/10/24 1239     Discharge Medications: Please see discharge summary for a list of discharge medications.  Relevant Imaging Results:  Relevant Lab Results:   Additional Information SSN: [id number]  Lauraine FORBES Saa, LCSWA

## 2024-04-10 NOTE — Care Management Important Message (Signed)
 Important Message  Patient Details  Name: Connor Morris MRN: [id number] Date of Birth: 09-14-1933   Important Message Given:        Claretta Deed 04/10/2024, 3:53 PM

## 2024-04-10 NOTE — Progress Notes (Signed)
 This chaplain responded to consult for creating/updating the Pt. Advance Directive.   The chaplain reviewed the Pt. chart notes and understands the Pt. daughter is HCPOA. Chart review also communicates the Pt. is only alert to person. Completing an AD is not possible at this time.  This chaplain closed the consult and is available for F/U spiritual care as needed.  Chaplain Leeroy Hummer [phone]

## 2024-04-10 NOTE — Progress Notes (Signed)
 Reviewed case and telemetry with Dr. Nancey.   Pt having intermittent CHB with stable junctional escape in the 40-50s. No clear correlation with symptoms, though pt is pleasantly demented and unable to provide a history.   Pt was previously hospitalized 2 weeks ago for COVID; and since has been less active. Using a WC and walker around the house.    At baseline he is alert to person, but not to time, location, or situation.   Given his severe co-morbidities, sepsis, and DNR/DNI status we feel pacing is unlikely to change his trajectory and would not recommend at this time.   Discussed with daughter and pt has a long history of bradycardia without symptoms and Synopsis shows HRs into 40-50s all the way back to 2020-2021.   She wishes to only pursue invasive procedures if absolutely necessary, and verbalizes understanding that at this juncture, pacing is not felt to be of a clear benefit.   Please call with further questions,   Ozell Jodie Lesia DEVONNA  04/10/2024 12:46 PM

## 2024-04-10 NOTE — TOC Progression Note (Signed)
 Transition of Care [facility]) - Progression Note    Patient Details  Name: Connor Morris MRN: [id number] Date of Birth: [birth year]  [facility]) CM/SW Contact  Lauraine FORBES Saa, LCSWA Phone Number: 04/10/2024, 2:53 PM  Clinical Narrative:     2:53 PM Patient's daughter, [name], returned CSW call and left voicemail. CSW called [name] to introduced self and role. CSW informed [name] of physical therapy's recommendation of patient discharging to SNF. CSW provided education on SNF process and insurance coverage. [name] was agreeable with patient discharging to SNF but stated that she was not interested in patient discharging to [facility]. CSW sent patient's FL2 to SNFs in [facility] (all but [facility], per request). [name] consented CSW to email SNF options with their quarterly Medicare ratings ([email or web address]).  Expected Discharge Plan: Skilled Nursing Facility Barriers to Discharge: Continued Medical Work up               Expected Discharge Plan and Services In-house Referral: Clinical Social Work     Living arrangements for the past 2 months: Single Family Home                                       Social Drivers of Health (SDOH) Interventions SDOH Screenings   Food Insecurity: No Food Insecurity (04/07/2024)  Housing: Low Risk  (04/07/2024)  Transportation Needs: No Transportation Needs (04/07/2024)  Utilities: Not At Risk (04/07/2024)  Social Connections: Socially Isolated (04/07/2024)  Tobacco Use: Medium Risk (04/07/2024)    Readmission Risk Interventions    03/20/2024    3:34 PM 03/10/2022    9:39 AM  Readmission Risk Prevention Plan  Post Dischage Appt  Complete  Medication Screening  Complete  Transportation Screening Complete Complete  PCP or Specialist Appt within 5-7 Days Complete   Home Care Screening Complete   Medication Review (RN CM) Complete

## 2024-04-11 ENCOUNTER — Encounter (HOSPITAL_COMMUNITY): Payer: Self-pay | Admitting: Family Medicine

## 2024-04-11 DIAGNOSIS — Z515 Encounter for palliative care: Secondary | ICD-10-CM | POA: Diagnosis not present

## 2024-04-11 DIAGNOSIS — N179 Acute kidney failure, unspecified: Secondary | ICD-10-CM | POA: Diagnosis not present

## 2024-04-11 DIAGNOSIS — A4151 Sepsis due to Escherichia coli [E. coli]: Secondary | ICD-10-CM

## 2024-04-11 DIAGNOSIS — Z66 Do not resuscitate: Secondary | ICD-10-CM

## 2024-04-11 DIAGNOSIS — N10 Acute tubulo-interstitial nephritis: Secondary | ICD-10-CM | POA: Diagnosis not present

## 2024-04-11 DIAGNOSIS — Z7189 Other specified counseling: Secondary | ICD-10-CM | POA: Diagnosis not present

## 2024-04-11 DIAGNOSIS — F0390 Unspecified dementia without behavioral disturbance: Secondary | ICD-10-CM | POA: Diagnosis not present

## 2024-04-11 DIAGNOSIS — A419 Sepsis, unspecified organism: Secondary | ICD-10-CM | POA: Diagnosis not present

## 2024-04-11 DIAGNOSIS — R001 Bradycardia, unspecified: Secondary | ICD-10-CM

## 2024-04-11 LAB — CBC
HCT: 32.1 % — ABNORMAL LOW (ref 39.0–52.0)
Hemoglobin: 9.8 g/dL — ABNORMAL LOW (ref 13.0–17.0)
MCH: 27.6 pg (ref 26.0–34.0)
MCHC: 30.5 g/dL (ref 30.0–36.0)
MCV: 90.4 fL (ref 80.0–100.0)
Platelets: 186 K/uL (ref 150–400)
RBC: 3.55 MIL/uL — ABNORMAL LOW (ref 4.22–5.81)
RDW: 14.6 % (ref 11.5–15.5)
WBC: 6.4 K/uL (ref 4.0–10.5)
nRBC: 0 % (ref 0.0–0.2)

## 2024-04-11 LAB — BASIC METABOLIC PANEL WITH GFR
Anion gap: 12 (ref 5–15)
BUN: 15 mg/dL (ref 8–23)
CO2: 20 mmol/L — ABNORMAL LOW (ref 22–32)
Calcium: 8.6 mg/dL — ABNORMAL LOW (ref 8.9–10.3)
Chloride: 108 mmol/L (ref 98–111)
Creatinine, Ser: 1.01 mg/dL (ref 0.61–1.24)
GFR, Estimated: >60 mL/min (ref >60)
Glucose, Bld: 95 mg/dL (ref 70–99)
Potassium: 4.5 mmol/L (ref 3.5–5.1)
Sodium: 140 mmol/L (ref 135–145)

## 2024-04-11 LAB — MAGNESIUM: Magnesium: 1.7 mg/dL (ref 1.7–2.4)

## 2024-04-11 LAB — C-REACTIVE PROTEIN: CRP: 8.9 mg/dL — ABNORMAL HIGH (ref <1.0)

## 2024-04-11 NOTE — Assessment & Plan Note (Addendum)
 04-11-2024 Scr down to 1.01. was admitted with Scr of 1.81 and treated with IVF.  can stop IVF today. AKI is now resolved.

## 2024-04-11 NOTE — Assessment & Plan Note (Signed)
 04-11-2024 severe sepsis present on admission. Lactic acid 3.6 on admission. WBC 16K on admission. Temp 103F.

## 2024-04-11 NOTE — Progress Notes (Signed)
 Palliative:  HPI: 89 y.o. male  with past medical history of sinus bradycardia, hyperlipidemia, abdominal ascending aortic aneurysm, history of DVT/PE on Xarelto , bladder and prostate cancer, dementia, chronic kidney disease, and chronic normocytic anemia admitted on 04/07/2024 with lethargy, shaking chills, loss of appetite, and fever. On presentation, he was febrile. Creatinine was 1.8, WBC of 16,000, lactic acid of 3.6 and INR of 6.1. UA was suggestive of UTI. Chest x-ray negative for acute findings. He was started on IV fluids and antibiotics.   Chart reviewed. I met today with Connor Morris. He is sitting up in recliner. He continues to be pleasantly confused and in good spirits. Sitting up in recliner. Tolerating diet and documented 30-50% of meals. Work up for SNF rehab ongoing. No family at bedside. I left MOST form at bedside for family to review.   Awakens easily. Good spirits. No distress. No complaints. Breathing regular, unlabored. Abd soft. Moves all extremities. Generalized weakness and fatigue.    Plan: - DNR in place - Time for outcomes  25 min  Bernarda Kitty, NP Palliative Medicine Team Pager [phone] (Please see [email or web address] for schedule) Team Phone [phone]

## 2024-04-11 NOTE — Assessment & Plan Note (Addendum)
 04-11-2024 likely caused by pyelonephritis. E. Coli is pan sensitive. Completed 2 days of IV Rocephin . On PO Duricef Day # 2.  04-12-2024 given his severe sepsis on admission, will treat with a total of 10 days of abx therapy.  He received 2 days of IV rocephin . Today is Day #3/8 of Duricef.  Plan a total of 8 days of po Duricef.  04-13-2024 complete 5 additional days of po Duricef 1000 mg bid at [facility]. This will make a total of 10 days of abx therapy to treat his E. Coli septicemia/pyelonephritis

## 2024-04-11 NOTE — Assessment & Plan Note (Addendum)
 04-11-2024 causes E. Coli septicemia. E. Coli is pan sensitive. Completed 2 days of IV Rocephin . On PO Duricef Day # 2.  04-12-2024 given his severe sepsis on admission, will treat with a total of 10 days of abx therapy.  He received 2 days of IV rocephin . Today is Day #3/8 of Duricef.  Plan a total of 8 days of po Duricef.  04-13-2024 complete 5 additional days of po Duricef 1000 mg bid at [facility]. This will make a total of 10 days of abx therapy to treat his E. Coli septicemia/pyelonephritis

## 2024-04-11 NOTE — Assessment & Plan Note (Deleted)
04/11/2024

## 2024-04-11 NOTE — TOC Progression Note (Signed)
 Transition of Care [facility]) - Progression Note    Patient Details  Name: Connor Morris MRN: [id number] Date of Birth: 10/24/1933  [facility] Care [facility]) CM/SW Contact  Lauraine FORBES Saa, LCSWA Phone Number: 04/11/2024, 11:01 AM  Clinical Narrative:     11:01 AM CSW emailed patient's daughter, [name] (patient is not fully oreinted) patient's current SNF options ([facility], [place], [address], [place], [facility], [place]) with their quarterly Medicare ratings.  Expected Discharge Plan: Skilled Nursing Facility Barriers to Discharge: Insurance Authorization, Other (must enter comment) (SNF pending bed decision)               Expected Discharge Plan and Services In-house Referral: Clinical Social Work   Post Acute Care Choice: Skilled Nursing Facility Living arrangements for the past 2 months: Single Family Home                                       Social Drivers of Health (SDOH) Interventions SDOH Screenings   Food Insecurity: No Food Insecurity (04/07/2024)  Housing: Low Risk  (04/07/2024)  Transportation Needs: No Transportation Needs (04/07/2024)  Utilities: Not At Risk (04/07/2024)  Social Connections: Socially Isolated (04/07/2024)  Tobacco Use: Medium Risk (04/07/2024)    Readmission Risk Interventions    03/20/2024    3:34 PM 03/10/2022    9:39 AM  Readmission Risk Prevention Plan  Post Dischage Appt  Complete  Medication Screening  Complete  Transportation Screening Complete Complete  PCP or Specialist Appt within 5-7 Days Complete   Home Care Screening Complete   Medication Review (RN CM) Complete

## 2024-04-11 NOTE — Assessment & Plan Note (Addendum)
 04-11-2024 back on xarelto  due to prior hx of PE and DVT  04-12-2024 stable on Xarelto .  04-13-2024 continue xarelto 

## 2024-04-11 NOTE — Assessment & Plan Note (Addendum)
 04-11-2024 pt made DNR/DNI on admission.

## 2024-04-11 NOTE — Assessment & Plan Note (Addendum)
 04-11-2024 severe sepsis present on admission. Lactic acid 3.6 on admission. WBC 16K on admission. Temp 103F.

## 2024-04-11 NOTE — Progress Notes (Signed)
 Occupational Therapy Treatment Patient Details Name: Connor Morris MRN: [id number] DOB: Jun 18, 1934 Today's Date: 04/11/2024   History of present illness 89 yo male admitted 8/23 with sepsis UTI PMH dementia, bladder CA s/p transurethral resection, prostate CA, thoracic aortic aneurysm, DVT, AAA   OT comments  Pt progressed from bed to chair this session. Pt noted to have some food pocketed in mouth with oral care this session. Pt more alert and interactive this session with staff. Pt with garbled speech but attempting to communicate and smiling at staff. Recommendations for skilled inpatient follow up therapy, <3 hours/day. Remain appropriate.       If plan is discharge home, recommend the following:  A lot of help with bathing/dressing/bathroom;A little help with walking and/or transfers   Equipment Recommendations  None recommended by OT    Recommendations for Other Services      Precautions / Restrictions Precautions Precautions: Fall Recall of Precautions/Restrictions: Impaired       Mobility Bed Mobility Overal bed mobility: Needs Assistance Bed Mobility: Supine to Sit Rolling: Supervision   Supine to sit: Min assist, HOB elevated (40 degrees)     General bed mobility comments: pad used to help progress hips to eob. pt with increased trunk anterior tilt this session for supervision static sitting    Transfers Overall transfer level: Needs assistance Equipment used: Rolling walker (2 wheels) Transfers: Sit to/from Stand, Bed to chair/wheelchair/BSC Sit to Stand: From elevated surface, Max assist Stand pivot transfers: +2 physical assistance, Mod assist         General transfer comment: pt with very small side steps to chair with LOB several times posterior. pt reaching for bed rail x2 during transfer and encouraged to hold RW. pt with increased initiation with RW present. pt reports its just so fast pt required > 4 minutets to stand pivot to chair.      Balance Overall balance assessment: Needs assistance Sitting-balance support: Bilateral upper extremity supported, Feet supported Sitting balance-Leahy Scale: Fair   Postural control: Posterior lean Standing balance support: Reliant on assistive device for balance, During functional activity, Bilateral upper extremity supported Standing balance-Leahy Scale: Poor                             ADL either performed or assessed with clinical judgement   ADL Overall ADL's : Needs assistance/impaired Eating/Feeding: Minimal assistance Eating/Feeding Details (indicate cue type and reason): increased time, needs food prepped and cut up small bites. pocketing food in cheeks with extensive time chewing Grooming: Oral care;Wash/dry hands;Moderate assistance;Sitting Grooming Details (indicate cue type and reason): required sitting due to unsteady with attempts at standing. pt very fearful of fall this session. pt brushing front teeth and needed cues to open mouth. pt noted to have portions of partially chewed breakfast in mouth                               General ADL Comments: sink level grooming    Extremity/Trunk Assessment Upper Extremity Assessment Upper Extremity Assessment: Right hand dominant   Lower Extremity Assessment Lower Extremity Assessment: Defer to PT evaluation        Vision   Vision Assessment?: No apparent visual deficits   Perception     Praxis     Communication Communication Communication: Impaired Factors Affecting Communication: Reduced clarity of speech   Cognition Arousal: Alert Behavior During Therapy: Flat affect Cognition:  Cognition impaired, No family/caregiver present to determine baseline             OT - Cognition Comments: baseline dementia                 Following commands: Impaired Following commands impaired: Follows one step commands with increased time (decreased initiation)      Cueing       Exercises      Shoulder Instructions       General Comments HR increased to 99 with activity of transfer    Pertinent Vitals/ Pain       Pain Assessment Pain Assessment: No/denies pain  Home Living                                          Prior Functioning/Environment              Frequency  Min 2X/week        Progress Toward Goals  OT Goals(current goals can now be found in the care plan section)  Progress towards OT goals: Progressing toward goals  Acute Rehab OT Goals Patient Stated Goal: none stated OT Goal Formulation: With patient Time For Goal Achievement: 04/23/24 Potential to Achieve Goals: Good ADL Goals Pt Will Perform Grooming: with set-up;sitting Pt Will Perform Lower Body Bathing: with min assist;sit to/from stand;sitting/lateral leans Pt Will Perform Lower Body Dressing: with mod assist;sit to/from stand Pt Will Transfer to Toilet: with min assist;bedside commode Additional ADL Goal #1: pt will complete bed mobility supervisino level as precursor to adl.s  Plan      Co-evaluation                 AM-PAC OT 6 Clicks Daily Activity     Outcome Measure   Help from another person eating meals?: A Little Help from another person taking care of personal grooming?: A Little Help from another person toileting, which includes using toliet, bedpan, or urinal?: A Lot Help from another person bathing (including washing, rinsing, drying)?: A Lot Help from another person to put on and taking off regular upper body clothing?: A Lot Help from another person to put on and taking off regular lower body clothing?: Total 6 Click Score: 13    End of Session Equipment Utilized During Treatment: Gait belt;Rolling walker (2 wheels)  OT Visit Diagnosis: Other abnormalities of gait and mobility (R26.89);Unsteadiness on feet (R26.81);Muscle weakness (generalized) (M62.81);Other symptoms and signs involving cognitive function   Activity  Tolerance Patient tolerated treatment well   Patient Left in chair;with call bell/phone within reach;with chair alarm set   Nurse Communication Mobility status;Precautions        Time: [phone] OT Time Calculation (min): 27 min  Charges: OT General Charges $OT Visit: 1 Visit OT Treatments $Self Care/Home Management : 23-37 mins   Brynn, OTR/L  Acute Rehabilitation Services Office: [phone] .   Ely Molt 04/11/2024, 9:55 AM

## 2024-04-11 NOTE — Assessment & Plan Note (Addendum)
 04-11-2024 BP stable. On norvasc  and hydralazine . Due to bradycardia, betablockers and non-dihydropyridines(I.e. cardizem, verapamil) are contraindicated.  04-12-2024 on norvasc  5 mg daily. Will start scheduled hydralazine  25 mg tid to help with HTN control.  04-13-2024 continue with norvasc  5 mg daily and hydralazine  25 mg tid.

## 2024-04-11 NOTE — Subjective & Objective (Addendum)
 Pt seen and examined. Pleasantly demented. TOC says he has bed available at [facility] today.

## 2024-04-11 NOTE — Hospital Course (Addendum)
 CC: lethargy, shaking chills HPI: Connor Morris is a 89 y.o. male with medical history significant for hypertension, history of DVT and PE on Xarelto , bladder cancer status post TURBT, and dementia who presents for evaluation of lethargy, shaking chills, and loss of appetite.   Patient was too generally weak and fatigued to participate with home PT yesterday.  He became progressively lethargic at home, was noted to be febrile, and developed shaking chills.     ED Course: Upon arrival to the ED, patient is found to be febrile to 40.1 C and saturating well on room air with normal HR and stable BP.  Labs are most notable for creatinine 1.80, albumin 2.8, WBC 16,000, lactic acid 3.6, and INR 6.1.  UA notable for bacteriuria, pyuria, and positive nitrites.  Chest x-ray is negative for acute findings.   Blood and urine cultures were collected and the patient was given 500 mL of LR, acetaminophen , cefepime , and Flagyl .    Significant Events: Admitted 04/07/2024 for UTI, severe sepsis 04-08-2024 blood cx turn positive for E. Coli in less than 24 hours. Pt seen by palliative Care Consult 04-10-2024 pt seen by EP/Cards consult. Started on po Duricef. IV Cefepime  stopped. 04-11-2024 transferred out of progressive care bed to med/surg bed  Admission Labs: WBC 16K, HgB 11.6, plt 216 INR 6.1 Lactic acid 3.6 Na 134, K 5.1, CO2 of 23, BUN 29, Scr 1.8, glu 101  Admission Imaging Studies: CXR Stable cardiomegaly and bibasilar scarring. No acute findings.  CT renal stone Increased right hydroureteronephrosis to the bladder base, without obstructing stone identified. Findings could indicate evidence of an ascending right-sided UTI with pyelonephritis, sequela of a recently passed stone, or a distal right ureteral stricture or neoplasm causing obstructive uropathy. 2. No urinary stone was seen on the current or prior studies. 3. Asymmetric increased right perinephric stranding and small amount of fluid in the  lower posterior right perirenal space. 4. Cystitis versus bladder nondistention. 5. Constipation and diverticulosis. 6. Right inguinal hernia containing the cecum, ileocecal junction and appendix, without evidence of incarceration or up strain obstruction. 7. Aortic and coronary artery atherosclerosis. 8. Emphysema. 9. Osteopenia and degenerative change with chronic compression fractures L1 and L2. 10. 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 Coli 04-09-2024 CRP 21.7  Significant Imaging Studies:   Antibiotic Therapy: Anti-infectives (From admission, onward)    Start     Dose/Rate Route Frequency Ordered Stop   04/10/24 1215  cefadroxil  (DURICEF) capsule 1,000 mg        1,000 mg Oral 2 times daily 04/10/24 1128 04/14/24 0959   04/08/24 1000  cefTRIAXone  (ROCEPHIN ) 2 g in sodium chloride  0.9 % 100 mL IVPB  Status:  Discontinued        2 g 200 mL/hr over 30 Minutes Intravenous Every 24 hours 04/08/24 0722 04/10/24 1128   04/08/24 0600  ceFEPIme  (MAXIPIME ) 2 g in sodium chloride  0.9 % 100 mL IVPB  Status:  Discontinued        2 g 200 mL/hr over 30 Minutes Intravenous Every 12 hours 04/07/24 2129 04/08/24 0722   04/07/24 1800  ceFEPIme  (MAXIPIME ) 2 g in sodium chloride  0.9 % 100 mL IVPB        2 g 200 mL/hr over 30 Minutes Intravenous  Once 04/07/24 1752 04/07/24 1900   04/07/24 1800  metroNIDAZOLE  (FLAGYL ) IVPB 500 mg        500 mg  100 mL/hr over 60 Minutes Intravenous  Once 04/07/24 1752 04/07/24 1949       Procedures:   Consultants: Palliative care EP/Cards

## 2024-04-11 NOTE — Assessment & Plan Note (Addendum)
 04-11-2024 seen by cardiology.  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 She wishes to only pursue invasive procedures if absolutely necessary, and verbalizes understanding that at this juncture, pacing is not felt to be of a clear benefit. No further workup needed.  04-12-2024 stable. Stop telemetry.  04-13-2024 do not give any betablockers or calcium channel blockers such as cardizem or verapamil.

## 2024-04-11 NOTE — Progress Notes (Signed)
 Physical Therapy Treatment Patient Details Name: Connor Morris MRN: [id number] DOB: 1934/02/06 Today's Date: 04/11/2024   History of Present Illness 89 yo male admitted 8/23 with sepsis UTI. PMH: dementia, bladder CA s/p transurethral resection, prostate CA, thoracic aortic aneurysm, DVT, AAA.    PT Comments  Pt received in chair A&O x1, pt attempting to sit up and requesting to go in the other room, pt reoriented to situation/location and redirected to work on transfer training and exercises, pt cooperative although dysarthric and difficult to understand. Pt needing up to +2 maxA to perform sit<>stand and TotalA to pivot from chair to bed via [facility] platform, as pt unable to weight shift in stance to safely step back to bed. Pt performed seated BLE AROM at EOB with multimodal cues, then agreeable to return to supine to try eating some of his lunch which had arrived ~1 hour prior but not been set up for him yet. Pt set up to eat but then unable to initiate taking bites of food or sips of beverage on his own, RN/NT notified he may need totalA for feeding this date given confusion, increased assist from previously. Patient will benefit from continued inpatient follow up therapy, <3 hours/day as he remains below reported PLOF.   If plan is discharge home, recommend the following: A lot of help with bathing/dressing/bathroom;Assistance with cooking/housework;Assist for transportation;Help with stairs or ramp for entrance;Supervision due to cognitive status;Two people to help with walking and/or transfers;Assistance with feeding;Direct supervision/assist for medications management;Direct supervision/assist for financial management   Can travel by private vehicle      (not on 8/27)  Equipment Recommendations  Other (comment) (pending progress; currently would need hospital bed and hoyer lift in addition to his RW and WC)    Recommendations for Other Services       Precautions / Restrictions  Precautions Precautions: Fall Recall of Precautions/Restrictions: Impaired Restrictions Weight Bearing Restrictions Per Provider Order: No     Mobility  Bed Mobility Overal bed mobility: Needs Assistance Bed Mobility: Sit to Supine       Sit to supine: Mod assist   General bed mobility comments: assist with LE returning to supine and pt using rail as well.    Transfers Overall transfer level: Needs assistance Equipment used: Ambulation equipment used Transfers: Sit to/from Stand, Bed to chair/wheelchair/BSC Sit to Stand: Max assist, +2 physical assistance, Via lift equipment           General transfer comment: chair>Stedy with pt unsuccessful with only +1 lift assist, needing +2 maxA via gait belt and bed pad assist to fully extend hips/knees to be able to place Stedy flaps. Pt then stood from elevated Stedy platform wtih +1 maxA while RN as +2 for safety to move lines/Stedy flaps as PTA providing heavy lift assist. Pt c/o fatigue after return to EOB and seated LE exercises. Transfer via Lift Equipment: Stedy  Ambulation/Gait               General Gait Details: Defer, pt not able to weight shift in stance this date   Stairs             Wheelchair Mobility     Tilt Bed    Modified Rankin (Stroke Patients Only)       Balance Overall balance assessment: Needs assistance Sitting-balance support: Bilateral upper extremity supported, Feet supported Sitting balance-Leahy Scale: Fair Sitting balance - Comments: supervision for safety, pt tending to use BUE Postural control: Posterior lean (standing) Standing balance support:  Reliant on assistive device for balance, During functional activity, Bilateral upper extremity supported Standing balance-Leahy Scale: Zero Standing balance comment: +1-2 maxA static standing in Stedy platform; not able to weight shift in stance today                            Communication Communication Communication:  Impaired Factors Affecting Communication: Reduced clarity of speech;Difficulty expressing self  Cognition Arousal: Alert Behavior During Therapy: Impulsive, Restless   PT - Cognitive impairments: History of cognitive impairments, No family/caregiver present to determine baseline                       PT - Cognition Comments: Pt very dysarthric and difficult to understand, needs max multimodal cues to follow instructions  and pt does not appear oriented to situation, location, time. Pt set up to take bites of lunch and food cut up for him but pt does not initiate taking bites, when PTA places food in front of his mouth he eats it/drinks and chews, RN/NT notified he likely needs full supervision/assist to feed at this time. Following commands: Impaired Following commands impaired: Follows one step commands with increased time (decreased initiation)    Cueing Cueing Techniques: Verbal cues, Gestural cues, Tactile cues  Exercises General Exercises - Lower Extremity Long Arc Quad: AROM, Both, 10 reps, Seated (visual and verbal cues) Hip Flexion/Marching: AROM, Both, 10 reps, Seated (visual and verbal cues)    General Comments        Pertinent Vitals/Pain Pain Assessment Pain Assessment: PAINAD Breathing: normal Negative Vocalization: none Facial Expression: sad, frightened, frown Body Language: tense, distressed pacing, fidgeting Consolability: distracted or reassured by voice/touch PAINAD Score: 3 Pain Intervention(s): Limited activity within patient's tolerance, Monitored during session, Repositioned    Home Living                          Prior Function            PT Goals (current goals can now be found in the care plan section) Acute Rehab PT Goals Patient Stated Goal: none stated PT Goal Formulation: Patient unable to participate in goal setting Time For Goal Achievement: 04/23/24 Progress towards PT goals: Not progressing toward goals - comment  (increased assist needed this date)    Frequency    Min 2X/week      PT Plan      Co-evaluation              AM-PAC PT 6 Clicks Mobility   Outcome Measure  Help needed turning from your back to your side while in a flat bed without using bedrails?: A Lot Help needed moving from lying on your back to sitting on the side of a flat bed without using bedrails?: A Lot Help needed moving to and from a bed to a chair (including a wheelchair)?: Total Help needed standing up from a chair using your arms (e.g., wheelchair or bedside chair)?: Total Help needed to walk in hospital room?: Total Help needed climbing 3-5 steps with a railing? : Total 6 Click Score: 8    End of Session Equipment Utilized During Treatment: Gait belt Activity Tolerance: Patient limited by fatigue Patient left: in bed;with call bell/phone within reach;with bed alarm set;Other (comment) (heels floated for comfort, HOB ~60 deg and set up with meal tray prepared in front of him to encourage him to consume lunch)  Nurse Communication: Mobility status;Need for lift equipment;Precautions;Other (comment) (Pt needing reminders and assist to take each bite/sip with meal today, which appears to be possible decline in mentation from previous session, also needing heavier lift assist (board updated on assist levels)) PT Visit Diagnosis: Unsteadiness on feet (R26.81);Muscle weakness (generalized) (M62.81);Difficulty in walking, not elsewhere classified (R26.2);Other abnormalities of gait and mobility (R26.89);Other symptoms and signs involving the nervous system (R29.898)     Time: 8855-8797 PT Time Calculation (min) (ACUTE ONLY): 18 min  Charges:    $Therapeutic Activity: 8-22 mins PT General Charges $$ ACUTE PT VISIT: 1 Visit                     Romaine Neville P., PTA Acute Rehabilitation Services Secure Chat Preferred 9a-5:30pm Office: [phone]    Connell HERO [facility] 04/11/2024, 1:54 PM

## 2024-04-11 NOTE — Assessment & Plan Note (Addendum)
 04-11-2024 stable. Will need SNF placement at discharge.  04-12-2024 stable. TOC working on SNF placement.  04-13-2024 DC to [facility] today.

## 2024-04-11 NOTE — Progress Notes (Signed)
 PROGRESS NOTE    Connor Morris  FMW:[id number] DOB: Mar 05, 1934 DOA: 04/07/2024 PCP: Larnell Hamilton, MD  Subjective: Pt seen and examined. He is confused. Awaiting SNF placement.  On po abx. Stable to transfer to floor bed.   Hospital Course: CC: lethargy, shaking chills HPI: Connor Morris is a 89 y.o. male with medical history significant for hypertension, history of DVT and PE on Xarelto , bladder cancer status post TURBT, and dementia who presents for evaluation of lethargy, shaking chills, and loss of appetite.   Patient was too generally weak and fatigued to participate with home PT yesterday.  He became progressively lethargic at home, was noted to be febrile, and developed shaking chills.     ED Course: Upon arrival to the ED, patient is found to be febrile to 40.1 C and saturating well on room air with normal HR and stable BP.  Labs are most notable for creatinine 1.80, albumin 2.8, WBC 16,000, lactic acid 3.6, and INR 6.1.  UA notable for bacteriuria, pyuria, and positive nitrites.  Chest x-ray is negative for acute findings.   Blood and urine cultures were collected and the patient was given 500 mL of LR, acetaminophen , cefepime , and Flagyl .    Significant Events: Admitted 04/07/2024 for UTI, severe sepsis 04-08-2024 blood cx turn positive for E. Coli in less than 24 hours. Pt seen by palliative Care Consult 04-10-2024 pt seen by EP/Cards consult  Admission Labs: WBC 16K, HgB 11.6, plt 216 INR 6.1 Lactic acid 3.6 Na 134, K 5.1, CO2 of 23, BUN 29, Scr 1.8, glu 101  Admission Imaging Studies: CXR Stable cardiomegaly and bibasilar scarring. No acute findings.  CT renal stone Increased right hydroureteronephrosis to the bladder base, without obstructing stone identified. Findings could indicate evidence of an ascending right-sided UTI with pyelonephritis, sequela of a recently passed stone, or a distal right ureteral stricture or neoplasm causing obstructive uropathy. 2. No  urinary stone was seen on the current or prior studies. 3. Asymmetric increased right perinephric stranding and small amount of fluid in the lower posterior right perirenal space. 4. Cystitis versus bladder nondistention. 5. Constipation and diverticulosis. 6. Right inguinal hernia containing the cecum, ileocecal junction and appendix, without evidence of incarceration or up strain obstruction. 7. Aortic and coronary artery atherosclerosis. 8. Emphysema. 9. Osteopenia and degenerative change with chronic compression fractures L1 and L2. 10. 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 Coli 04-09-2024 CRP 21.7  Significant Imaging Studies:   Antibiotic Therapy: Anti-infectives (From admission, onward)    Start     Dose/Rate Route Frequency Ordered Stop   04/10/24 1215  cefadroxil  (DURICEF) capsule 1,000 mg        1,000 mg Oral 2 times daily 04/10/24 1128 04/14/24 0959   04/08/24 1000  cefTRIAXone  (ROCEPHIN ) 2 g in sodium chloride  0.9 % 100 mL IVPB  Status:  Discontinued        2 g 200 mL/hr over 30 Minutes Intravenous Every 24 hours 04/08/24 0722 04/10/24 1128   04/08/24 0600  ceFEPIme  (MAXIPIME ) 2 g in sodium chloride  0.9 % 100 mL IVPB  Status:  Discontinued        2 g 200 mL/hr over 30 Minutes Intravenous Every 12 hours 04/07/24 2129 04/08/24 0722   04/07/24 1800  ceFEPIme  (MAXIPIME ) 2 g in sodium chloride  0.9 % 100 mL IVPB        2 g 200 mL/hr over 30 Minutes  Intravenous  Once 04/07/24 1752 04/07/24 1900   04/07/24 1800  metroNIDAZOLE  (FLAGYL ) IVPB 500 mg        500 mg 100 mL/hr over 60 Minutes Intravenous  Once 04/07/24 1752 04/07/24 1949       Procedures:   Consultants: Palliative care EP/Cards    Assessment and Plan: * Sepsis secondary to UTI (HCC) 04-11-2024 severe sepsis present on admission. Lactic acid 3.6 on admission. WBC 16K on admission. Temp 103F.  Acute  pyelonephritis 04-11-2024 causes E. Coli septicemia. E. Coli is pan sensitive. Completed 2 days of IV Rocephin . On PO Duricef Day # 2.  E. coli septicemia (HCC) 04-11-2024 likely caused by pyelonephritis. E. Coli is pan sensitive. Completed 2 days of IV Rocephin . On PO Duricef Day # 2.  Severe sepsis (HCC) 04-11-2024 severe sepsis present on admission. Lactic acid 3.6 on admission. WBC 16K on admission. Temp 103F.  Acute renal failure superimposed on stage 3a chronic kidney disease (HCC) 04-11-2024 Scr down to 1.01. was admitted with Scr of 1.81 and treated with IVF.  can stop IVF today. AKI is now resolved.  DNR (do not resuscitate)DNI(Do Not Intubate) 04-11-2024 pt made DNR/DNI on admission.  Coagulopathy (HCC) 04-11-2024 resolved. Due to sepsis.  Primary hypertension - Due to bradycardia, betablockers and non-dihydropyridines(I.e. cardizem, verapamil) are contraindicated. 04-11-2024 BP stable. On norvasc  and hydralazine . Due to bradycardia, betablockers and non-dihydropyridines(I.e. cardizem, verapamil) are contraindicated.  Dementia without behavioral disturbance (HCC) 04-11-2024 stable. Will need SNF placement at discharge.  Current use of long term anticoagulation 04-11-2024 back on xarelto  due to prior hx of PE and DVT  History of pulmonary embolism 04-11-2024 Xarelto  was held on admission due to elevated INR. Now back on Xarelto .  Bradycardia 04-11-2024 seen by cardiology.  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 She wishes to only pursue invasive procedures if absolutely necessary, and verbalizes understanding that at this juncture, pacing is not felt to be of a clear benefit. No further workup needed.  DVT prophylaxis: SCDs Start: 04/07/24 2124 rivaroxaban  (XARELTO ) tablet 20 mg     Code Status: Limited: Do not attempt resuscitation (DNR) -DNR-LIMITED -Do Not Intubate/DNI   Family Communication: no family at bedside. Pt is demented Disposition Plan: SNF Reason for continuing need for hospitalization: medically stable for DC  Objective: Vitals:   04/11/24 0425 04/11/24 0718 04/11/24 0947 04/11/24 1047  BP: (!) 172/65 (!) 160/96 125/72 127/72  Pulse:    76  Resp: 18 14  (!) 26  Temp: 98.3 F (36.8 C) 98.3 F (36.8 C)  98.3 F (36.8 C)  TempSrc: Oral Oral  Oral  SpO2:  96%  94%  Weight: 73.6 kg     Height:        Intake/Output Summary (Last 24 hours) at 04/11/2024 1409 Last data filed at 04/11/2024 0724 Gross per 24 hour  Intake 1902.52 ml  Output 1200 ml  Net 702.52 ml   Filed Weights   04/09/24 0458 04/10/24 0400 04/11/24 0425  Weight: 70.4 kg 74.3 kg 73.6 kg   Examination:  Physical Exam Vitals and nursing note reviewed.  Constitutional:      General: He is not in acute distress.    Appearance: He is not toxic-appearing or diaphoretic.  HENT:     Head: Normocephalic and atraumatic.     Nose: Nose normal.  Eyes:     General: No scleral icterus. Cardiovascular:  Rate and Rhythm: Normal rate and regular rhythm.  Pulmonary:     Effort: Pulmonary effort is normal. No respiratory distress.     Breath sounds: Normal breath sounds.  Abdominal:     General: Abdomen is flat. Bowel sounds are normal. There is no distension.     Palpations: Abdomen is soft.  Musculoskeletal:     Right lower leg: No edema.     Left lower leg: No edema.  Skin:    General: Skin is warm and dry.     Capillary Refill: Capillary refill takes less than 2 seconds.  Neurological:     General: No focal deficit present.     Mental Status: He is alert. He is disoriented.     Data Reviewed: I have personally reviewed following labs and imaging studies  CBC: Recent Labs  Lab 04/07/24 1638 04/08/24 0322 04/09/24 0228 04/10/24 0239 04/11/24 0219  WBC 16.0* 12.6* 6.4 4.6 6.4  NEUTROABS 14.0*  --   --   --   --   HGB 11.6* 9.1* 9.6* 9.4* 9.8*  HCT 37.5*  28.9* 30.5* 30.5* 32.1*  MCV 92.4 88.4 88.9 89.7 90.4  PLT 216 159 149* 171 186   Basic Metabolic Panel: Recent Labs  Lab 04/07/24 1933 04/08/24 0322 04/09/24 0228 04/10/24 0239 04/11/24 0219  NA 134* 135 137 139 140  K 5.1 4.5 4.4 4.5 4.5  CL 102 105 106 111 108  CO2 23 22 22  20* 20*  GLUCOSE 101* 104* 95 104* 95  BUN 29* 30* 27* 21 15  CREATININE 1.80* 1.63* 1.31* 1.21 1.01  CALCIUM 9.5 8.8* 8.8* 9.1 8.6*  MG  --   --  1.7 1.8 1.7   GFR: Estimated Creatinine Clearance: 50.6 mL/min (by C-G formula based on SCr of 1.01 mg/dL). Liver Function Tests: Recent Labs  Lab 04/07/24 1933 04/08/24 0322  AST 20 16  ALT 12 10  ALKPHOS 40 34*  BILITOT 1.4* 1.4*  PROT 7.1 5.7*  ALBUMIN 2.8* 2.2*   Coagulation Profile: Recent Labs  Lab 04/07/24 1638 04/08/24 0322 04/10/24 0239  INR 6.1* 4.8* 1.4*   CBG: Recent Labs  Lab 04/08/24 1421  GLUCAP 89   Sepsis Labs: Recent Labs  Lab 04/07/24 1733 04/07/24 1938 04/07/24 2304 04/08/24 0322  LATICACIDVEN 3.6* 2.4* 4.3* 1.7    Recent Results (from the past 240 hours)  Blood Culture (routine x 2)     Status: Abnormal   Collection Time: 04/07/24  5:00 PM   Specimen: BLOOD RIGHT HAND  Result Value Ref Range Status   Specimen Description BLOOD RIGHT HAND  Final   Special Requests   Final    BOTTLES DRAWN AEROBIC AND ANAEROBIC Blood Culture adequate volume   Culture  Setup Time   Final    GRAM NEGATIVE RODS IN BOTH AEROBIC AND ANAEROBIC BOTTLES Organism ID to follow CRITICAL RESULT CALLED TO, READ BACK BY AND VERIFIED WITH: PHARMD J LEDFORD 04/08/2024 @ 0701 BY AB Performed at [facility], [address] [place], [place] [address]    Culture ESCHERICHIA COLI (A)  Final   Report Status 04/10/2024 FINAL  Final   Organism ID, Bacteria ESCHERICHIA COLI  Final      Susceptibility   Escherichia coli - MIC*    AMPICILLIN  <=2 SENSITIVE Sensitive     CEFAZOLIN  (NON-URINE) 2 SENSITIVE Sensitive     CEFEPIME  <=0.12  SENSITIVE Sensitive     ERTAPENEM <=0.12 SENSITIVE Sensitive     CEFTRIAXONE  <=0.25  SENSITIVE Sensitive     CIPROFLOXACIN <=0.06 SENSITIVE Sensitive     GENTAMICIN <=1 SENSITIVE Sensitive     MEROPENEM <=0.25 SENSITIVE Sensitive     TRIMETH/SULFA <=20 SENSITIVE Sensitive     AMPICILLIN /SULBACTAM <=2 SENSITIVE Sensitive     PIP/TAZO Value in next row Sensitive ug/mL     <=4 SENSITIVEThis is a modified FDA-approved test that has been validated and its performance characteristics determined by the reporting laboratory.  This laboratory is certified under the Clinical Laboratory Improvement Amendments CLIA as qualified to perform high complexity clinical laboratory testing.    * ESCHERICHIA COLI  Blood Culture ID Panel (Reflexed)     Status: Abnormal   Collection Time: 04/07/24  5:00 PM  Result Value Ref Range Status   Enterococcus faecalis NOT DETECTED NOT DETECTED Final   Enterococcus Faecium NOT DETECTED NOT DETECTED Final   Listeria monocytogenes NOT DETECTED NOT DETECTED Final   Staphylococcus species NOT DETECTED NOT DETECTED Final   Staphylococcus aureus (BCID) NOT DETECTED NOT DETECTED Final   Staphylococcus epidermidis NOT DETECTED NOT DETECTED Final   Staphylococcus lugdunensis NOT DETECTED NOT DETECTED Final   Streptococcus species NOT DETECTED NOT DETECTED Final   Streptococcus agalactiae NOT DETECTED NOT DETECTED Final   Streptococcus pneumoniae NOT DETECTED NOT DETECTED Final   Streptococcus pyogenes NOT DETECTED NOT DETECTED Final   A.calcoaceticus-baumannii NOT DETECTED NOT DETECTED Final   Bacteroides fragilis NOT DETECTED NOT DETECTED Final   Enterobacterales DETECTED (A) NOT DETECTED Final    Comment: Enterobacterales represent a large order of gram negative bacteria, not a single organism. CRITICAL RESULT CALLED TO, READ BACK BY AND VERIFIED WITH: PHARMD J LEDFORD 04/08/2024 @ 0701 BY AB    Enterobacter cloacae complex NOT DETECTED NOT DETECTED Final   Escherichia coli  DETECTED (A) NOT DETECTED Final    Comment: CRITICAL RESULT CALLED TO, READ BACK BY AND VERIFIED WITH: PHARMD J LEDFORD 04/08/2024 @ 0701 BY AB    Klebsiella aerogenes NOT DETECTED NOT DETECTED Final   Klebsiella oxytoca NOT DETECTED NOT DETECTED Final   Klebsiella pneumoniae NOT DETECTED NOT DETECTED Final   Proteus species NOT DETECTED NOT DETECTED Final   Salmonella species NOT DETECTED NOT DETECTED Final   Serratia marcescens NOT DETECTED NOT DETECTED Final   Haemophilus influenzae NOT DETECTED NOT DETECTED Final   Neisseria meningitidis NOT DETECTED NOT DETECTED Final   Pseudomonas aeruginosa NOT DETECTED NOT DETECTED Final   Stenotrophomonas maltophilia NOT DETECTED NOT DETECTED Final   Candida albicans NOT DETECTED NOT DETECTED Final   Candida auris NOT DETECTED NOT DETECTED Final   Candida glabrata NOT DETECTED NOT DETECTED Final   Candida krusei NOT DETECTED NOT DETECTED Final   Candida parapsilosis NOT DETECTED NOT DETECTED Final   Candida tropicalis NOT DETECTED NOT DETECTED Final   Cryptococcus neoformans/gattii NOT DETECTED NOT DETECTED Final   CTX-M ESBL NOT DETECTED NOT DETECTED Final   Carbapenem resistance IMP NOT DETECTED NOT DETECTED Final   Carbapenem resistance KPC NOT DETECTED NOT DETECTED Final   Carbapenem resistance NDM NOT DETECTED NOT DETECTED Final   Carbapenem resist OXA 48 LIKE NOT DETECTED NOT DETECTED Final   Carbapenem resistance VIM NOT DETECTED NOT DETECTED Final    Comment: Performed at [facility], [address] [place], [place] [address]  Blood Culture (routine x 2)     Status: Abnormal   Collection Time: 04/07/24  5:14 PM   Specimen: BLOOD RIGHT FOREARM  Result Value Ref Range Status   Specimen  Description BLOOD RIGHT FOREARM  Final   Special Requests   Final    BOTTLES DRAWN AEROBIC ONLY Blood Culture results may not be optimal due to an inadequate volume of blood received in culture bottles   Culture  Setup Time   Final    GRAM  NEGATIVE RODS AEROBIC BOTTLE ONLY CRITICAL VALUE NOTED.  VALUE IS CONSISTENT WITH PREVIOUSLY REPORTED AND CALLED VALUE.    Culture (A)  Final    ESCHERICHIA COLI SUSCEPTIBILITIES PERFORMED ON PREVIOUS CULTURE WITHIN THE LAST 5 DAYS. Performed at [facility], [address], [place], [place] [address]    Report Status 04/10/2024 FINAL  Final  Resp panel by RT-PCR (RSV, Flu A&B, Covid) Anterior Nasal Swab     Status: Abnormal   Collection Time: 04/07/24  5:21 PM   Specimen: Anterior Nasal Swab  Result Value Ref Range Status   SARS Coronavirus 2 by RT PCR POSITIVE (A) NEGATIVE Final   Influenza A by PCR NEGATIVE NEGATIVE Final   Influenza B by PCR NEGATIVE NEGATIVE Final    Comment: (NOTE) The Xpert Xpress SARS-CoV-2/FLU/RSV plus assay is intended as an aid in the diagnosis of influenza from Nasopharyngeal swab specimens and should not be used as a sole basis for treatment. Nasal washings and aspirates are unacceptable for Xpert Xpress SARS-CoV-2/FLU/RSV testing.  Fact Sheet for Patients: BloggerCourse.com  Fact Sheet for Healthcare Providers: SeriousBroker.it  This test is not yet approved or cleared by the [place]  FDA and has been authorized for detection and/or diagnosis of SARS-CoV-2 by FDA under an Emergency Use Authorization (EUA). This EUA will remain in effect (meaning this test can be used) for the duration of the COVID-19 declaration under Section 564(b)(1) of the Act, 21 U.S.C. section 360bbb-3(b)(1), unless the authorization is terminated or revoked.     Resp Syncytial Virus by PCR NEGATIVE NEGATIVE Final    Comment: (NOTE) Fact Sheet for Patients: BloggerCourse.com  Fact Sheet for Healthcare Providers: SeriousBroker.it  This test is not yet approved or cleared by the [place]  FDA and has been authorized for detection and/or diagnosis of  SARS-CoV-2 by FDA under an Emergency Use Authorization (EUA). This EUA will remain in effect (meaning this test can be used) for the duration of the COVID-19 declaration under Section 564(b)(1) of the Act, 21 U.S.C. section 360bbb-3(b)(1), unless the authorization is terminated or revoked.  Performed at [facility], [address] [place], [place] [address]   Urine Culture     Status: Abnormal   Collection Time: 04/07/24  8:38 PM   Specimen: Urine, Random  Result Value Ref Range Status   Specimen Description URINE, RANDOM  Final   Special Requests NONE Reflexed from [id number]  Final   Culture (A)  Final    <10,000 COLONIES/mL INSIGNIFICANT GROWTH Performed at [facility], [address] [place], [place] [address]    Report Status 04/08/2024 FINAL  Final  MRSA Next Gen by PCR, Nasal     Status: None   Collection Time: 04/07/24 11:14 PM   Specimen: Nasal Mucosa; Nasal Swab  Result Value Ref Range Status   MRSA by PCR Next Gen NOT DETECTED NOT DETECTED Final    Comment: (NOTE) The GeneXpert MRSA Assay (FDA approved for NASAL specimens only), is one component of a comprehensive MRSA colonization surveillance program. It is not intended to diagnose MRSA infection nor to guide or monitor treatment for MRSA infections. Test performance is not FDA approved in patients less  than 47 years old. Performed at [facility], [address] [place], [place] [address]     Scheduled Meds:  amLODipine   5 mg Oral Daily   cefadroxil   1,000 mg Oral BID   feeding supplement  237 mL Oral TID BM   latanoprost   1 drop Both Eyes QHS   multivitamin with minerals  1 tablet Oral Daily   pantoprazole   40 mg Oral Daily   rivaroxaban   20 mg Oral Q supper   sodium chloride  flush  3 mL Intravenous Q12H   Continuous Infusions:   LOS: 4 days   Time spent: 60 minutes  Camellia Door, DO  [facility]  04/11/2024, 2:09 PM

## 2024-04-11 NOTE — Assessment & Plan Note (Addendum)
 04-11-2024 Xarelto  was held on admission due to elevated INR. Now back on Xarelto .  04-12-2024 discussed with anticoagulation specialist pharmacist. INR should not be used to monitor Xarelto  therapy. Pt admission INR of 6.1 and his subsequent INR should not be used to dictate whether his Xarelto  should be stopped or restarted.  04-13-2024 continue xarelto 

## 2024-04-11 NOTE — Plan of Care (Signed)
  Problem: Education: Goal: Knowledge of risk factors and measures for prevention of condition will improve Outcome: Progressing   Problem: Coping: Goal: Psychosocial and spiritual needs will be supported Outcome: Progressing   Problem: Respiratory: Goal: Complications related to the disease process, condition or treatment will be avoided or minimized Outcome: Progressing   Problem: Clinical Measurements: Goal: Diagnostic test results will improve Outcome: Progressing Goal: Signs and symptoms of infection will decrease Outcome: Progressing   Problem: Education: Goal: Knowledge of General Education information will improve Description: Including pain rating scale, medication(s)/side effects and non-pharmacologic comfort measures Outcome: Progressing

## 2024-04-11 NOTE — Care Management Important Message (Signed)
 Important Message  Patient Details  Name: Eron Staat MRN: [id number] Date of Birth: [birth year]   Important Message Given:  Yes - Medicare IM     Claretta Deed 04/11/2024, 4:07 PM

## 2024-04-11 NOTE — Assessment & Plan Note (Addendum)
 04-11-2024 resolved. Due to sepsis.

## 2024-04-12 DIAGNOSIS — Z515 Encounter for palliative care: Secondary | ICD-10-CM | POA: Diagnosis not present

## 2024-04-12 DIAGNOSIS — F0390 Unspecified dementia without behavioral disturbance: Secondary | ICD-10-CM | POA: Diagnosis not present

## 2024-04-12 DIAGNOSIS — Z7189 Other specified counseling: Secondary | ICD-10-CM | POA: Diagnosis not present

## 2024-04-12 DIAGNOSIS — N10 Acute tubulo-interstitial nephritis: Secondary | ICD-10-CM | POA: Diagnosis not present

## 2024-04-12 DIAGNOSIS — R001 Bradycardia, unspecified: Secondary | ICD-10-CM | POA: Diagnosis not present

## 2024-04-12 DIAGNOSIS — A419 Sepsis, unspecified organism: Secondary | ICD-10-CM | POA: Diagnosis not present

## 2024-04-12 DIAGNOSIS — A4151 Sepsis due to Escherichia coli [E. coli]: Secondary | ICD-10-CM | POA: Diagnosis not present

## 2024-04-12 LAB — MAGNESIUM: Magnesium: 1.6 mg/dL — ABNORMAL LOW (ref 1.7–2.4)

## 2024-04-12 MED ORDER — HYDRALAZINE HCL 25 MG PO TABS
25.0000 mg | ORAL_TABLET | Freq: Three times a day (TID) | ORAL | Status: DC
  Administered 2024-04-13: 25 mg via ORAL
  Filled 2024-04-12: qty 1

## 2024-04-12 MED ORDER — HYDRALAZINE HCL 25 MG PO TABS
25.0000 mg | ORAL_TABLET | Freq: Three times a day (TID) | ORAL | Status: DC
  Filled 2024-04-12: qty 1

## 2024-04-12 MED ORDER — HYDRALAZINE HCL 20 MG/ML IJ SOLN: 10.0000 mg | Freq: Four times a day (QID) | INTRAMUSCULAR | Status: DC | PRN

## 2024-04-12 MED ORDER — MAGNESIUM SULFATE 4 GM/100ML IV SOLN
4.0000 g | Freq: Once | INTRAVENOUS | Status: AC
  Administered 2024-04-12: 4 g via INTRAVENOUS
  Filled 2024-04-12: qty 100

## 2024-04-12 NOTE — Progress Notes (Signed)
 Physical Therapy Treatment Patient Details Name: Connor Morris MRN: [id number] DOB: 08-Dec-1933 Today's Date: 04/12/2024   History of Present Illness 89 yo male admitted 8/23 with sepsis UTI. PMH: dementia, bladder CA s/p transurethral resection, prostate CA, thoracic aortic aneurysm, DVT, AAA.    PT Comments  Pt greeted supine in bed, pleasant and agreeable to PT session. He required increased physical assistance with functional mobility as treatment progressed d/t fatigue. Pt performed sit<>stand using RW with minA x2. He maintained static stance for ~45 seconds before fatiguing. Pt was unable to take steps or pivot on feet. Introduced stedy to complete bed>chair transfer with mod-maxA x2. Will continue to follow acutely and advance appropriately.      If plan is discharge home, recommend the following: Two people to help with walking and/or transfers;A lot of help with bathing/dressing/bathroom;Assistance with cooking/housework;Assist for transportation;Help with stairs or ramp for entrance;Supervision due to cognitive status;Direct supervision/assist for medications management;Direct supervision/assist for financial management   Can travel by private vehicle     No  Equipment Recommendations  Hospital bed;Hoyer lift    Recommendations for Other Services       Precautions / Restrictions Precautions Precautions: Fall Recall of Precautions/Restrictions: Impaired Restrictions Weight Bearing Restrictions Per Provider Order: No     Mobility  Bed Mobility Overal bed mobility: Needs Assistance Bed Mobility: Supine to Sit     Supine to sit: Min assist, Mod assist, HOB elevated, Used rails, +2 for safety/equipment     General bed mobility comments: Pt sat up on R side of bed with increased time. Cues for sequencing. He brought BLE off EOB. Assist to elevate trunk and scoot hips forward.    Transfers Overall transfer level: Needs assistance Equipment used: Rolling walker (2  wheels), Ambulation equipment used Transfers: Sit to/from Stand, Bed to chair/wheelchair/BSC Sit to Stand: From elevated surface, Min assist, +2 physical assistance, +2 safety/equipment, Mod assist, Max assist           General transfer comment: Pt stood from raised bed height. Cued proper hand placement using RW. Powered up with minA x2. Increased time to acheive upright posture with full knee ext bilat. Cued pt to increased WBing thru BUE support on RW to offload LEs. He quickly fatigued. Good eccentric control. Brought stedy to safely transfer to recliner chair. He required mod-maxA x2 to power up from bed into standing and from stedy seat into standing. Transfer via Lift Equipment: Stedy  Ambulation/Gait               General Gait Details: Unable (Pt couldn't clear foot from ground or pivot on either LE.)   Stairs             Wheelchair Mobility     Tilt Bed    Modified Rankin (Stroke Patients Only)       Balance Overall balance assessment: Needs assistance Sitting-balance support: Bilateral upper extremity supported, Feet supported Sitting balance-Leahy Scale: Fair Sitting balance - Comments: Pt required CGA while seated EOB. Postural control: Posterior lean Standing balance support: Bilateral upper extremity supported, During functional activity, Reliant on assistive device for balance Standing balance-Leahy Scale: Poor Standing balance comment: Pt dependent on RW or stedy and required +2 assist.                            Communication Communication Communication: Impaired Factors Affecting Communication: Reduced clarity of speech;Difficulty expressing self  Cognition Arousal: Alert Behavior During Therapy: Flat  affect   PT - Cognitive impairments: History of cognitive impairments (Hx of Dementia)                       PT - Cognition Comments: Pt A,O to self. Following commands: Impaired Following commands impaired: Only  follows one step commands consistently, Follows one step commands with increased time    Cueing Cueing Techniques: Verbal cues, Gestural cues, Tactile cues  Exercises      General Comments General comments (skin integrity, edema, etc.): VSS on RA      Pertinent Vitals/Pain Pain Assessment Pain Assessment: PAINAD Breathing: normal Negative Vocalization: none Facial Expression: smiling or inexpressive Body Language: tense, distressed pacing, fidgeting Consolability: distracted or reassured by voice/touch PAINAD Score: 2 Pain Intervention(s): Monitored during session, Limited activity within patient's tolerance, Repositioned    Home Living                          Prior Function            PT Goals (current goals can now be found in the care plan section) Acute Rehab PT Goals Patient Stated Goal: None stated Progress towards PT goals: Progressing toward goals    Frequency    Min 2X/week      PT Plan      Co-evaluation              AM-PAC PT 6 Clicks Mobility   Outcome Measure  Help needed turning from your back to your side while in a flat bed without using bedrails?: Total Help needed moving from lying on your back to sitting on the side of a flat bed without using bedrails?: Total Help needed moving to and from a bed to a chair (including a wheelchair)?: Total Help needed standing up from a chair using your arms (e.g., wheelchair or bedside chair)?: Total Help needed to walk in hospital room?: Total Help needed climbing 3-5 steps with a railing? : Total 6 Click Score: 6    End of Session Equipment Utilized During Treatment: Gait belt Activity Tolerance: Patient tolerated treatment well;Patient limited by fatigue Patient left: in chair;with call bell/phone within reach;with chair alarm set Nurse Communication: Mobility status;Need for lift equipment (stedy) PT Visit Diagnosis: Unsteadiness on feet (R26.81);Muscle weakness (generalized)  (M62.81);Difficulty in walking, not elsewhere classified (R26.2);Other abnormalities of gait and mobility (R26.89);Other symptoms and signs involving the nervous system (R29.898)     Time: 8846-8787 PT Time Calculation (min) (ACUTE ONLY): 19 min  Charges:    $Therapeutic Activity: 8-22 mins PT General Charges $$ ACUTE PT VISIT: 1 Visit                     Randall SAUNDERS, PT, DPT Acute Rehabilitation Services Office: [phone] Secure Chat Preferred  Connor Morris 04/12/2024, 1:25 PM

## 2024-04-12 NOTE — Plan of Care (Signed)
  Problem: Coping: Goal: Level of anxiety will decrease Outcome: Progressing   Problem: Elimination: Goal: Will not experience complications related to bowel motility Outcome: Progressing   Problem: Elimination: Goal: Will not experience complications related to urinary retention Outcome: Progressing   

## 2024-04-12 NOTE — Progress Notes (Signed)
 Notified provider about holding Hydralazine  due to low dbp. Md advised to hold medication and start tomorrow.  04/12/24 1638  Vitals  BP (!) 139/58  MAP (mmHg) 82  BP Location Right Arm  BP Method Automatic  Patient Position (if appropriate) Sitting  Pulse Rate 65  Pulse Rate Source Monitor  Resp 20  MEWS COLOR  MEWS Score Color Green  MEWS Score  MEWS Temp 0  MEWS Systolic 0  MEWS Pulse 0  MEWS RR 0  MEWS LOC 0  MEWS Score 0

## 2024-04-12 NOTE — Progress Notes (Signed)
 Pts daughter, [name], confirmed contact number on file is current.

## 2024-04-12 NOTE — Progress Notes (Addendum)
 PROGRESS NOTE    Connor Morris  FMW:[id number] DOB: 1934-05-08 DOA: 04/07/2024 PCP: Larnell Hamilton, MD  Subjective: Pt seen and examined. He is confused. He is reclined in bedside recliner. On RA.     Hospital Course: CC: lethargy, shaking chills HPI: Connor Morris is a 89 y.o. male with medical history significant for hypertension, history of DVT and PE on Xarelto , bladder cancer status post TURBT, and dementia who presents for evaluation of lethargy, shaking chills, and loss of appetite.   Patient was too generally weak and fatigued to participate with home PT yesterday.  He became progressively lethargic at home, was noted to be febrile, and developed shaking chills.     ED Course: Upon arrival to the ED, patient is found to be febrile to 40.1 C and saturating well on room air with normal HR and stable BP.  Labs are most notable for creatinine 1.80, albumin 2.8, WBC 16,000, lactic acid 3.6, and INR 6.1.  UA notable for bacteriuria, pyuria, and positive nitrites.  Chest x-ray is negative for acute findings.   Blood and urine cultures were collected and the patient was given 500 mL of LR, acetaminophen , cefepime , and Flagyl .    Significant Events: Admitted 04/07/2024 for UTI, severe sepsis 04-08-2024 blood cx turn positive for E. Coli in less than 24 hours. Pt seen by palliative Care Consult 04-10-2024 pt seen by EP/Cards consult. Started on po Duricef. IV Cefepime  stopped. 04-11-2024 transferred out of progressive care bed to med/surg bed  Admission Labs: WBC 16K, HgB 11.6, plt 216 INR 6.1 Lactic acid 3.6 Na 134, K 5.1, CO2 of 23, BUN 29, Scr 1.8, glu 101  Admission Imaging Studies: CXR Stable cardiomegaly and bibasilar scarring. No acute findings.  CT renal stone Increased right hydroureteronephrosis to the bladder base, without obstructing stone identified. Findings could indicate evidence of an ascending right-sided UTI with pyelonephritis, sequela of a recently passed  stone, or a distal right ureteral stricture or neoplasm causing obstructive uropathy. 2. No urinary stone was seen on the current or prior studies. 3. Asymmetric increased right perinephric stranding and small amount of fluid in the lower posterior right perirenal space. 4. Cystitis versus bladder nondistention. 5. Constipation and diverticulosis. 6. Right inguinal hernia containing the cecum, ileocecal junction and appendix, without evidence of incarceration or up strain obstruction. 7. Aortic and coronary artery atherosclerosis. 8. Emphysema. 9. Osteopenia and degenerative change with chronic compression fractures L1 and L2. 10. 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 Coli 04-09-2024 CRP 21.7  Significant Imaging Studies:   Antibiotic Therapy: Anti-infectives (From admission, onward)    Start     Dose/Rate Route Frequency Ordered Stop   04/10/24 1215  cefadroxil  (DURICEF) capsule 1,000 mg        1,000 mg Oral 2 times daily 04/10/24 1128 04/14/24 0959   04/08/24 1000  cefTRIAXone  (ROCEPHIN ) 2 g in sodium chloride  0.9 % 100 mL IVPB  Status:  Discontinued        2 g 200 mL/hr over 30 Minutes Intravenous Every 24 hours 04/08/24 0722 04/10/24 1128   04/08/24 0600  ceFEPIme  (MAXIPIME ) 2 g in sodium chloride  0.9 % 100 mL IVPB  Status:  Discontinued        2 g 200 mL/hr over 30 Minutes Intravenous Every 12 hours 04/07/24 2129 04/08/24 0722   04/07/24 1800  ceFEPIme  (MAXIPIME ) 2 g in sodium chloride  0.9 % 100 mL IVPB  2 g 200 mL/hr over 30 Minutes Intravenous  Once 04/07/24 1752 04/07/24 1900   04/07/24 1800  metroNIDAZOLE  (FLAGYL ) IVPB 500 mg        500 mg 100 mL/hr over 60 Minutes Intravenous  Once 04/07/24 1752 04/07/24 1949       Procedures:   Consultants: Palliative care EP/Cards    Assessment and Plan: * Sepsis secondary to UTI (HCC) 04-11-2024 severe sepsis  present on admission. Lactic acid 3.6 on admission. WBC 16K on admission. Temp 103F.  Acute pyelonephritis 04-11-2024 causes E. Coli septicemia. E. Coli is pan sensitive. Completed 2 days of IV Rocephin . On PO Duricef Day # 2.  04-12-2024 given his severe sepsis on admission, will treat with a total of 10 days of abx therapy.  He received 2 days of IV rocephin . Today is Day #3/8 of Duricef.  Plan a total of 8 days of po Duricef.  E. coli septicemia (HCC) 04-11-2024 likely caused by pyelonephritis. E. Coli is pan sensitive. Completed 2 days of IV Rocephin . On PO Duricef Day # 2.  04-12-2024 given his severe sepsis on admission, will treat with a total of 10 days of abx therapy.  He received 2 days of IV rocephin . Today is Day #3/8 of Duricef.  Plan a total of 8 days of po Duricef.  Severe sepsis (HCC) 04-11-2024 severe sepsis present on admission. Lactic acid 3.6 on admission. WBC 16K on admission. Temp 103F.  Acute renal failure superimposed on stage 3a chronic kidney disease (HCC)-resolved as of 04/12/2024 04-11-2024 Scr down to 1.01. was admitted with Scr of 1.81 and treated with IVF.  can stop IVF today. AKI is now resolved.  DNR (do not resuscitate)DNI(Do Not Intubate) 04-11-2024 pt made DNR/DNI on admission.    Primary hypertension - Due to bradycardia, betablockers and non-dihydropyridines(I.e. cardizem, verapamil) are contraindicated. 04-11-2024 BP stable. On norvasc  and hydralazine . Due to bradycardia, betablockers and non-dihydropyridines(I.e. cardizem, verapamil) are contraindicated.  04-12-2024 on norvasc  5 mg daily. Will start scheduled hydralazine  25 mg tid to help with HTN control.  Dementia without behavioral disturbance (HCC) 04-11-2024 stable. Will need SNF placement at discharge.  04-12-2024 stable. TOC working on SNF placement.  Current use of long term anticoagulation 04-11-2024 back on xarelto  due to prior hx of PE and DVT  04-12-2024 stable on  Xarelto .  History of pulmonary embolism 04-11-2024 Xarelto  was held on admission due to elevated INR. Now back on Xarelto .  04-12-2024 discussed with anticoagulation specialist pharmacist. INR should not be used to monitor Xarelto  therapy. Pt admission INR of 6.1 and his subsequent INR should not be used to dictate whether his Xarelto  should be stopped or restarted.  Bradycardia 04-11-2024 seen by cardiology.  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 She wishes to only pursue invasive procedures if absolutely necessary, and verbalizes understanding that at this juncture, pacing is not felt to be of a clear benefit. No further workup needed.  04-12-2024 stable. Stop telemetry.  Hypomagnesemia 04-12-2024 give IV Mg today.   DVT prophylaxis: SCDs Start: 04/07/24 2124 rivaroxaban  (XARELTO ) tablet 20 mg     Code Status: Limited: Do not attempt resuscitation (DNR) -DNR-LIMITED -Do Not Intubate/DNI  Family Communication: no family at bedside Disposition Plan: SNF at discharge Reason for continuing need for hospitalization: medically stable for DC. Awaiting SNF bed and ins authorization.  Objective: Vitals:   04/11/24 2234 04/12/24 0011 04/12/24 0557 04/12/24 1046  BP: (!) 158/52 (!) 155/64 (!) 173/58 (!) 163/68  Pulse: 61 63 (!) 57 65  Resp:  17 18 18   Temp:  98.9 F (37.2 C) 98.1 F (36.7 C) 98.6 F (37 C)  TempSrc:  Oral Oral   SpO2:  94% 96% 100%  Weight:      Height:       No intake or output data in the 24 hours ending 04/12/24 1549 Filed Weights   04/09/24 0458 04/10/24 0400 04/11/24 0425  Weight: 70.4 kg 74.3 kg 73.6 kg    Examination:  Physical Exam Vitals and nursing note reviewed.  Constitutional:      General: He is not in acute distress.    Appearance: He is not toxic-appearing or diaphoretic.     Comments: Chronically ill appearing  HENT:     Head:  Normocephalic and atraumatic.     Nose: Nose normal.  Eyes:     General: No scleral icterus. Cardiovascular:     Rate and Rhythm: Normal rate and regular rhythm.  Pulmonary:     Effort: Pulmonary effort is normal.     Breath sounds: Normal breath sounds.  Abdominal:     General: Bowel sounds are normal. There is no distension.     Palpations: Abdomen is soft.     Tenderness: There is no abdominal tenderness.  Musculoskeletal:     Right lower leg: No edema.     Left lower leg: No edema.  Skin:    General: Skin is warm and dry.     Capillary Refill: Capillary refill takes less than 2 seconds.  Neurological:     General: No focal deficit present.     Mental Status: He is alert. He is disoriented.     Data Reviewed: I have personally reviewed following labs and imaging studies  CBC: Recent Labs  Lab 04/07/24 1638 04/08/24 0322 04/09/24 0228 04/10/24 0239 04/11/24 0219  WBC 16.0* 12.6* 6.4 4.6 6.4  NEUTROABS 14.0*  --   --   --   --   HGB 11.6* 9.1* 9.6* 9.4* 9.8*  HCT 37.5* 28.9* 30.5* 30.5* 32.1*  MCV 92.4 88.4 88.9 89.7 90.4  PLT 216 159 149* 171 186   Basic Metabolic Panel: Recent Labs  Lab 04/07/24 1933 04/08/24 0322 04/09/24 0228 04/10/24 0239 04/11/24 0219 04/12/24 0442  NA 134* 135 137 139 140  --   K 5.1 4.5 4.4 4.5 4.5  --   CL 102 105 106 111 108  --   CO2 23 22 22  20* 20*  --   GLUCOSE 101* 104* 95 104* 95  --   BUN 29* 30* 27* 21 15  --   CREATININE 1.80* 1.63* 1.31* 1.21 1.01  --   CALCIUM 9.5 8.8* 8.8* 9.1 8.6*  --   MG  --   --  1.7 1.8 1.7 1.6*   GFR: Estimated Creatinine Clearance: 50.6 mL/min (by C-G formula based on SCr of 1.01 mg/dL). Liver Function Tests: Recent Labs  Lab 04/07/24 1933 04/08/24 0322  AST 20 16  ALT 12 10  ALKPHOS 40 34*  BILITOT 1.4* 1.4*  PROT 7.1 5.7*  ALBUMIN 2.8* 2.2*   Coagulation Profile: Recent Labs  Lab 04/07/24 1638 04/08/24 0322 04/10/24 0239  INR 6.1* 4.8* 1.4*   CBG: Recent Labs  Lab  04/08/24 1421  GLUCAP 89   Sepsis Labs: Recent Labs  Lab 04/07/24 1733 04/07/24 1938 04/07/24 2304 04/08/24 0322  LATICACIDVEN 3.6* 2.4* 4.3* 1.7  Recent Results (from the past 240 hours)  Blood Culture (routine x 2)     Status: Abnormal   Collection Time: 04/07/24  5:00 PM   Specimen: BLOOD RIGHT HAND  Result Value Ref Range Status   Specimen Description BLOOD RIGHT HAND  Final   Special Requests   Final    BOTTLES DRAWN AEROBIC AND ANAEROBIC Blood Culture adequate volume   Culture  Setup Time   Final    GRAM NEGATIVE RODS IN BOTH AEROBIC AND ANAEROBIC BOTTLES Organism ID to follow CRITICAL RESULT CALLED TO, READ BACK BY AND VERIFIED WITH: PHARMD J LEDFORD 04/08/2024 @ 0701 BY AB Performed at [facility], [address] [place], [place] [address]    Culture ESCHERICHIA COLI (A)  Final   Report Status 04/10/2024 FINAL  Final   Organism ID, Bacteria ESCHERICHIA COLI  Final      Susceptibility   Escherichia coli - MIC*    AMPICILLIN  <=2 SENSITIVE Sensitive     CEFAZOLIN  (NON-URINE) 2 SENSITIVE Sensitive     CEFEPIME  <=0.12 SENSITIVE Sensitive     ERTAPENEM <=0.12 SENSITIVE Sensitive     CEFTRIAXONE  <=0.25 SENSITIVE Sensitive     CIPROFLOXACIN <=0.06 SENSITIVE Sensitive     GENTAMICIN <=1 SENSITIVE Sensitive     MEROPENEM <=0.25 SENSITIVE Sensitive     TRIMETH/SULFA <=20 SENSITIVE Sensitive     AMPICILLIN /SULBACTAM <=2 SENSITIVE Sensitive     PIP/TAZO Value in next row Sensitive ug/mL     <=4 SENSITIVEThis is a modified FDA-approved test that has been validated and its performance characteristics determined by the reporting laboratory.  This laboratory is certified under the Clinical Laboratory Improvement Amendments CLIA as qualified to perform high complexity clinical laboratory testing.    * ESCHERICHIA COLI  Blood Culture ID Panel (Reflexed)     Status: Abnormal   Collection Time: 04/07/24  5:00 PM  Result Value Ref Range Status   Enterococcus faecalis NOT  DETECTED NOT DETECTED Final   Enterococcus Faecium NOT DETECTED NOT DETECTED Final   Listeria monocytogenes NOT DETECTED NOT DETECTED Final   Staphylococcus species NOT DETECTED NOT DETECTED Final   Staphylococcus aureus (BCID) NOT DETECTED NOT DETECTED Final   Staphylococcus epidermidis NOT DETECTED NOT DETECTED Final   Staphylococcus lugdunensis NOT DETECTED NOT DETECTED Final   Streptococcus species NOT DETECTED NOT DETECTED Final   Streptococcus agalactiae NOT DETECTED NOT DETECTED Final   Streptococcus pneumoniae NOT DETECTED NOT DETECTED Final   Streptococcus pyogenes NOT DETECTED NOT DETECTED Final   A.calcoaceticus-baumannii NOT DETECTED NOT DETECTED Final   Bacteroides fragilis NOT DETECTED NOT DETECTED Final   Enterobacterales DETECTED (A) NOT DETECTED Final    Comment: Enterobacterales represent a large order of gram negative bacteria, not a single organism. CRITICAL RESULT CALLED TO, READ BACK BY AND VERIFIED WITH: PHARMD J LEDFORD 04/08/2024 @ 0701 BY AB    Enterobacter cloacae complex NOT DETECTED NOT DETECTED Final   Escherichia coli DETECTED (A) NOT DETECTED Final    Comment: CRITICAL RESULT CALLED TO, READ BACK BY AND VERIFIED WITH: PHARMD J LEDFORD 04/08/2024 @ 0701 BY AB    Klebsiella aerogenes NOT DETECTED NOT DETECTED Final   Klebsiella oxytoca NOT DETECTED NOT DETECTED Final   Klebsiella pneumoniae NOT DETECTED NOT DETECTED Final   Proteus species NOT DETECTED NOT DETECTED Final   Salmonella species NOT DETECTED NOT DETECTED Final   Serratia marcescens NOT DETECTED NOT DETECTED Final   Haemophilus influenzae NOT DETECTED NOT DETECTED Final   Neisseria meningitidis  NOT DETECTED NOT DETECTED Final   Pseudomonas aeruginosa NOT DETECTED NOT DETECTED Final   Stenotrophomonas maltophilia NOT DETECTED NOT DETECTED Final   Candida albicans NOT DETECTED NOT DETECTED Final   Candida auris NOT DETECTED NOT DETECTED Final   Candida glabrata NOT DETECTED NOT DETECTED Final    Candida krusei NOT DETECTED NOT DETECTED Final   Candida parapsilosis NOT DETECTED NOT DETECTED Final   Candida tropicalis NOT DETECTED NOT DETECTED Final   Cryptococcus neoformans/gattii NOT DETECTED NOT DETECTED Final   CTX-M ESBL NOT DETECTED NOT DETECTED Final   Carbapenem resistance IMP NOT DETECTED NOT DETECTED Final   Carbapenem resistance KPC NOT DETECTED NOT DETECTED Final   Carbapenem resistance NDM NOT DETECTED NOT DETECTED Final   Carbapenem resist OXA 48 LIKE NOT DETECTED NOT DETECTED Final   Carbapenem resistance VIM NOT DETECTED NOT DETECTED Final    Comment: Performed at [facility], [address], [place], [place] [address]  Blood Culture (routine x 2)     Status: Abnormal   Collection Time: 04/07/24  5:14 PM   Specimen: BLOOD RIGHT FOREARM  Result Value Ref Range Status   Specimen Description BLOOD RIGHT FOREARM  Final   Special Requests   Final    BOTTLES DRAWN AEROBIC ONLY Blood Culture results may not be optimal due to an inadequate volume of blood received in culture bottles   Culture  Setup Time   Final    GRAM NEGATIVE RODS AEROBIC BOTTLE ONLY CRITICAL VALUE NOTED.  VALUE IS CONSISTENT WITH PREVIOUSLY REPORTED AND CALLED VALUE.    Culture (A)  Final    ESCHERICHIA COLI SUSCEPTIBILITIES PERFORMED ON PREVIOUS CULTURE WITHIN THE LAST 5 DAYS. Performed at [facility], [address], [place], [place] [address]    Report Status 04/10/2024 FINAL  Final  Resp panel by RT-PCR (RSV, Flu A&B, Covid) Anterior Nasal Swab     Status: Abnormal   Collection Time: 04/07/24  5:21 PM   Specimen: Anterior Nasal Swab  Result Value Ref Range Status   SARS Coronavirus 2 by RT PCR POSITIVE (A) NEGATIVE Final   Influenza A by PCR NEGATIVE NEGATIVE Final   Influenza B by PCR NEGATIVE NEGATIVE Final    Comment: (NOTE) The Xpert Xpress SARS-CoV-2/FLU/RSV plus assay is intended as an aid in the diagnosis of influenza from Nasopharyngeal swab specimens and should  not be used as a sole basis for treatment. Nasal washings and aspirates are unacceptable for Xpert Xpress SARS-CoV-2/FLU/RSV testing.  Fact Sheet for Patients: BloggerCourse.com  Fact Sheet for Healthcare Providers: SeriousBroker.it  This test is not yet approved or cleared by the [place]  FDA and has been authorized for detection and/or diagnosis of SARS-CoV-2 by FDA under an Emergency Use Authorization (EUA). This EUA will remain in effect (meaning this test can be used) for the duration of the COVID-19 declaration under Section 564(b)(1) of the Act, 21 U.S.C. section 360bbb-3(b)(1), unless the authorization is terminated or revoked.     Resp Syncytial Virus by PCR NEGATIVE NEGATIVE Final    Comment: (NOTE) Fact Sheet for Patients: BloggerCourse.com  Fact Sheet for Healthcare Providers: SeriousBroker.it  This test is not yet approved or cleared by the [place]  FDA and has been authorized for detection and/or diagnosis of SARS-CoV-2 by FDA under an Emergency Use Authorization (EUA). This EUA will remain in effect (meaning this test can be used) for the duration of the COVID-19 declaration under Section 564(b)(1) of the Act, 21 U.S.C. section 360bbb-3(b)(1),  unless the authorization is terminated or revoked.  Performed at [facility], [address] [place], [place] [address]   Urine Culture     Status: Abnormal   Collection Time: 04/07/24  8:38 PM   Specimen: Urine, Random  Result Value Ref Range Status   Specimen Description URINE, RANDOM  Final   Special Requests NONE Reflexed from [id number]  Final   Culture (A)  Final    <10,000 COLONIES/mL INSIGNIFICANT GROWTH Performed at [facility], [address] [place], [place] [address]    Report Status 04/08/2024 FINAL  Final  MRSA Next Gen by PCR, Nasal     Status: None   Collection Time: 04/07/24  11:14 PM   Specimen: Nasal Mucosa; Nasal Swab  Result Value Ref Range Status   MRSA by PCR Next Gen NOT DETECTED NOT DETECTED Final    Comment: (NOTE) The GeneXpert MRSA Assay (FDA approved for NASAL specimens only), is one component of a comprehensive MRSA colonization surveillance program. It is not intended to diagnose MRSA infection nor to guide or monitor treatment for MRSA infections. Test performance is not FDA approved in patients less than 34 years old. Performed at [facility], [address] [place], [place] [address]     Scheduled Meds:  amLODipine   5 mg Oral Daily   cefadroxil   1,000 mg Oral BID   feeding supplement  237 mL Oral TID BM   hydrALAZINE   25 mg Oral Q8H   latanoprost   1 drop Both Eyes QHS   multivitamin with minerals  1 tablet Oral Daily   pantoprazole   40 mg Oral Daily   rivaroxaban   20 mg Oral Q supper   sodium chloride  flush  3 mL Intravenous Q12H   Continuous Infusions:  magnesium  sulfate bolus IVPB 4 g (04/12/24 1508)     LOS: 5 days   Time spent: 60 minutes  Camellia Door, DO  [facility]  04/12/2024, 3:49 PM

## 2024-04-12 NOTE — Plan of Care (Signed)
  Problem: Respiratory: Goal: Ability to maintain adequate ventilation will improve Outcome: Progressing   Problem: Activity: Goal: Risk for activity intolerance will decrease Outcome: Progressing   Problem: Elimination: Goal: Will not experience complications related to urinary retention Outcome: Progressing   Problem: Pain Managment: Goal: General experience of comfort will improve and/or be controlled Outcome: Progressing   Problem: Safety: Goal: Ability to remain free from injury will improve Outcome: Progressing   Problem: Skin Integrity: Goal: Risk for impaired skin integrity will decrease Outcome: Progressing

## 2024-04-12 NOTE — TOC Progression Note (Signed)
 Transition of Care [facility]) - Progression Note    Patient Details  Name: Connor Morris MRN: [id number] Date of Birth: [birth year]  Transition of Care [facility]) CM/SW Contact  Farmer Mccahill LITTIE [name], [name] Phone Number: 04/12/2024, 10:22 AM  Clinical Narrative:    CSW initiated insurance process for [facility] and received auth approval reference [id number] effective 8/29-9/2. CSW confirmed bed availability with [facility], facility can accept pt when medically ready for discharge.   Expected Discharge Plan: Skilled Nursing Facility Barriers to Discharge: Insurance Authorization, Other (must enter comment) (SNF pending bed decision)               Expected Discharge Plan and Services In-house Referral: Clinical Social Work   Post Acute Care Choice: Skilled Nursing Facility Living arrangements for the past 2 months: Single Family Home                                       Social Drivers of Health (SDOH) Interventions SDOH Screenings   Food Insecurity: No Food Insecurity (04/07/2024)  Housing: Low Risk  (04/07/2024)  Transportation Needs: No Transportation Needs (04/07/2024)  Utilities: Not At Risk (04/07/2024)  Social Connections: Socially Isolated (04/07/2024)  Tobacco Use: Medium Risk (04/07/2024)    Readmission Risk Interventions    03/20/2024    3:34 PM 03/10/2022    9:39 AM  Readmission Risk Prevention Plan  Post Dischage Appt  Complete  Medication Screening  Complete  Transportation Screening Complete Complete  PCP or Specialist Appt within 5-7 Days Complete   Home Care Screening Complete   Medication Review (RN CM) Complete

## 2024-04-12 NOTE — Assessment & Plan Note (Signed)
 04-12-2024 give IV Mg today.  04-13-2024 Mg of 2.1 today. Resolved.

## 2024-04-12 NOTE — Progress Notes (Signed)
 Palliative:  HPI: 89 y.o. male with past medical history of sinus bradycardia, hyperlipidemia, abdominal ascending aortic aneurysm, history of DVT/PE on Xarelto , bladder and prostate cancer, dementia, chronic kidney disease, and chronic normocytic anemia admitted on 04/07/2024 with lethargy, shaking chills, loss of appetite, and fever. On presentation, he was febrile. Creatinine was 1.8, WBC of 16,000, lactic acid of 3.6 and INR of 6.1. UA was suggestive of UTI. Chest x-ray negative for acute findings. He was started on IV fluids and antibiotics.   Connor Morris is sleeping - I did not awaken. I called and discussed with daughter, Connor Morris. We reviewed his progress including eating and progress with PT. Reviewed that he continues to require significant assistance with activity and movement. She reports that he was doing very well prior to his last admission but was still hesitant and working with therapy at home prior to current hospitalization. We discussed plans for SNF rehab and this will take time to see how he does. We discussed that dementia can be an obstacle to progress as well. Connor Morris is hopeful that he can continue to progress and improve with more time and work with therapy. I explained that I left her a MOST form at bedside to review and discuss with her family. I explained that the MOST form covers many common decisions that people are faced with as their health declines and changes over time. I encouraged her to review along with Hard Choices and discuss with her family. She has no further questions or concerns at this time. She has palliative contact information and will call us  as needed.   Exam: Sleeping peacefully. Breathing regular, unlabored. Abd flat. No distress.   Plan: - DNR in place - Palliative available as needed - MOST form provided to family for review  25 min  Bernarda Kitty, NP Palliative Medicine Team Pager [phone] (Please see [email or web address] for schedule) Team Phone  [phone]

## 2024-04-13 DIAGNOSIS — A419 Sepsis, unspecified organism: Secondary | ICD-10-CM | POA: Diagnosis not present

## 2024-04-13 DIAGNOSIS — Z7901 Long term (current) use of anticoagulants: Secondary | ICD-10-CM

## 2024-04-13 DIAGNOSIS — A4151 Sepsis due to Escherichia coli [E. coli]: Secondary | ICD-10-CM | POA: Diagnosis not present

## 2024-04-13 DIAGNOSIS — R001 Bradycardia, unspecified: Secondary | ICD-10-CM | POA: Diagnosis not present

## 2024-04-13 DIAGNOSIS — N10 Acute tubulo-interstitial nephritis: Secondary | ICD-10-CM | POA: Diagnosis not present

## 2024-04-13 LAB — MAGNESIUM: Magnesium: 2.1 mg/dL (ref 1.7–2.4)

## 2024-04-13 MED ORDER — CEFADROXIL 500 MG PO CAPS: 1000.0000 mg | ORAL_CAPSULE | Freq: Two times a day (BID) | ORAL | Status: AC

## 2024-04-13 NOTE — Discharge Summary (Signed)
 [facility] Physician Discharge Summary   Patient name: Connor Morris  Admit date:     04/07/2024  Discharge date: 04/13/2024  Attending Physician: CHARLTON EVALENE RAMAN [[id number]]  Discharge Physician: Camellia Door   PCP: Larnell Hamilton, MD  Admitted From: Home  Disposition:  [facility]  Recommendations for Outpatient Follow-up:  Follow up with PCP in 1-2 weeks  Home Health:No Equipment/Devices: None    Discharge Condition:Stable CODE STATUS:DNR/DNI Diet recommendation: mechanical soft diet Fluid Restriction: None  Hospital Summary: CC: lethargy, shaking chills HPI: Connor Morris is a 89 y.o. male with medical history significant for hypertension, history of DVT and PE on Xarelto , bladder cancer status post TURBT, and dementia who presents for evaluation of lethargy, shaking chills, and loss of appetite.   Patient was too generally weak and fatigued to participate with home PT yesterday.  He became progressively lethargic at home, was noted to be febrile, and developed shaking chills.     ED Course: Upon arrival to the ED, patient is found to be febrile to 40.1 C and saturating well on room air with normal HR and stable BP.  Labs are most notable for creatinine 1.80, albumin 2.8, WBC 16,000, lactic acid 3.6, and INR 6.1.  UA notable for bacteriuria, pyuria, and positive nitrites.  Chest x-ray is negative for acute findings.   Blood and urine cultures were collected and the patient was given 500 mL of LR, acetaminophen , cefepime , and Flagyl .    Significant Events: Admitted 04/07/2024 for UTI, severe sepsis 04-08-2024 blood cx turn positive for E. Coli in less than 24 hours. Pt seen by palliative Care Consult 04-10-2024 pt seen by EP/Cards consult. Started on po Duricef. IV Cefepime  stopped. 04-11-2024 transferred out of progressive care bed to med/surg bed  Admission Labs: WBC 16K, HgB 11.6, plt 216 INR 6.1 Lactic acid 3.6 Na 134, K 5.1, CO2 of 23, BUN 29, Scr  1.8, glu 101  Admission Imaging Studies: CXR Stable cardiomegaly and bibasilar scarring. No acute findings.  CT renal stone Increased right hydroureteronephrosis to the bladder base, without obstructing stone identified. Findings could indicate evidence of an ascending right-sided UTI with pyelonephritis, sequela of a recently passed stone, or a distal right ureteral stricture or neoplasm causing obstructive uropathy. 2. No urinary stone was seen on the current or prior studies. 3. Asymmetric increased right perinephric stranding and small amount of fluid in the lower posterior right perirenal space. 4. Cystitis versus bladder nondistention. 5. Constipation and diverticulosis. 6. Right inguinal hernia containing the cecum, ileocecal junction and appendix, without evidence of incarceration or up strain obstruction. 7. Aortic and coronary artery atherosclerosis. 8. Emphysema. 9. Osteopenia and degenerative change with chronic compression fractures L1 and L2. 10. 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 Coli 04-09-2024 CRP 21.7  Significant Imaging Studies:   Antibiotic Therapy: Anti-infectives (From admission, onward)    Start     Dose/Rate Route Frequency Ordered Stop   04/10/24 1215  cefadroxil  (DURICEF) capsule 1,000 mg        1,000 mg Oral 2 times daily 04/10/24 1128 04/14/24 0959   04/08/24 1000  cefTRIAXone  (ROCEPHIN ) 2 g in sodium chloride  0.9 % 100 mL IVPB  Status:  Discontinued        2 g 200 mL/hr over 30 Minutes Intravenous Every 24 hours 04/08/24 0722 04/10/24 1128   04/08/24 0600  ceFEPIme  (MAXIPIME ) 2 g in sodium chloride  0.9 %  100 mL IVPB  Status:  Discontinued        2 g 200 mL/hr over 30 Minutes Intravenous Every 12 hours 04/07/24 2129 04/08/24 0722   04/07/24 1800  ceFEPIme  (MAXIPIME ) 2 g in sodium chloride  0.9 % 100 mL IVPB        2 g 200 mL/hr over 30 Minutes  Intravenous  Once 04/07/24 1752 04/07/24 1900   04/07/24 1800  metroNIDAZOLE  (FLAGYL ) IVPB 500 mg        500 mg 100 mL/hr over 60 Minutes Intravenous  Once 04/07/24 1752 04/07/24 1949       Procedures:   Consultants: Palliative care EP/Cards   Hospital Course by Problem: * Sepsis secondary to UTI (HCC) 04-11-2024 severe sepsis present on admission. Lactic acid 3.6 on admission. WBC 16K on admission. Temp 103F.  Acute pyelonephritis 04-11-2024 causes E. Coli septicemia. E. Coli is pan sensitive. Completed 2 days of IV Rocephin . On PO Duricef Day # 2.  04-12-2024 given his severe sepsis on admission, will treat with a total of 10 days of abx therapy.  He received 2 days of IV rocephin . Today is Day #3/8 of Duricef.  Plan a total of 8 days of po Duricef.  04-13-2024 complete 5 additional days of po Duricef 1000 mg bid at [facility]. This will make a total of 10 days of abx therapy to treat his E. Coli septicemia/pyelonephritis  E. coli septicemia (HCC) 04-11-2024 likely caused by pyelonephritis. E. Coli is pan sensitive. Completed 2 days of IV Rocephin . On PO Duricef Day # 2.  04-12-2024 given his severe sepsis on admission, will treat with a total of 10 days of abx therapy.  He received 2 days of IV rocephin . Today is Day #3/8 of Duricef.  Plan a total of 8 days of po Duricef.  04-13-2024 complete 5 additional days of po Duricef 1000 mg bid at [facility]. This will make a total of 10 days of abx therapy to treat his E. Coli septicemia/pyelonephritis  Severe sepsis (HCC)-resolved as of 04/13/2024 04-11-2024 severe sepsis present on admission. Lactic acid 3.6 on admission. WBC 16K on admission. Temp 103F.  Acute renal failure superimposed on stage 3a chronic kidney disease (HCC)-resolved as of 04/12/2024 04-11-2024 Scr down to 1.01. was admitted with Scr of 1.81 and treated with IVF.  can stop IVF today. AKI is now resolved.  DNR (do not resuscitate)DNI(Do Not Intubate) 04-11-2024 pt made  DNR/DNI on admission.    Primary hypertension - Due to bradycardia, betablockers and non-dihydropyridines(I.e. cardizem, verapamil) are contraindicated. 04-11-2024 BP stable. On norvasc  and hydralazine . Due to bradycardia, betablockers and non-dihydropyridines(I.e. cardizem, verapamil) are contraindicated.  04-12-2024 on norvasc  5 mg daily. Will start scheduled hydralazine  25 mg tid to help with HTN control.  04-13-2024 continue with norvasc  5 mg daily and hydralazine  25 mg tid.    Dementia without behavioral disturbance (HCC) 04-11-2024 stable. Will need SNF placement at discharge.  04-12-2024 stable. TOC working on SNF placement.  04-13-2024 DC to [facility] today.  Current use of long term anticoagulation 04-11-2024 back on xarelto  due to prior hx of PE and DVT  04-12-2024 stable on Xarelto .  04-13-2024 continue xarelto    History of pulmonary embolism 04-11-2024 Xarelto  was held on admission due to elevated INR. Now back on Xarelto .  04-12-2024 discussed with anticoagulation specialist pharmacist. INR should not be used to monitor Xarelto  therapy. Pt admission INR of 6.1 and his subsequent INR should not be used to dictate whether his Xarelto  should be stopped or restarted.  04-13-2024 continue xarelto   Bradycardia 04-11-2024 seen by cardiology.  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 She wishes to only pursue invasive procedures if absolutely necessary, and verbalizes understanding that at this juncture, pacing is not felt to be of a clear benefit. No further workup needed.  04-12-2024 stable. Stop telemetry.  04-13-2024 do not give any betablockers or calcium channel blockers such as cardizem or verapamil.  Hypomagnesemia 04-12-2024 give IV Mg today.  04-13-2024 Mg of 2.1 today. Resolved.    Discharge Diagnoses:  Principal Problem:   Sepsis secondary to UTI  [facility]) Active Problems:   E. coli septicemia (HCC)   Acute pyelonephritis   Bradycardia   History of pulmonary embolism   Current use of long term anticoagulation   Dementia without behavioral disturbance (HCC)   Primary hypertension - Due to bradycardia, betablockers and non-dihydropyridines(I.e. cardizem, verapamil) are contraindicated.   DNR (do not resuscitate)DNI(Do Not Intubate)   Hypomagnesemia   Discharge Instructions  Discharge Instructions     Call MD for:  difficulty breathing, headache or visual disturbances   Complete by: As directed    Call MD for:  extreme fatigue   Complete by: As directed    Call MD for:  hives   Complete by: As directed    Call MD for:  persistant dizziness or light-headedness   Complete by: As directed    Call MD for:  persistant nausea and vomiting   Complete by: As directed    Call MD for:  redness, tenderness, or signs of infection (pain, swelling, redness, odor or green/yellow discharge around incision site)   Complete by: As directed    Call MD for:  severe uncontrolled pain   Complete by: As directed    Call MD for:  temperature >100.4   Complete by: As directed    DIET DYS 3   Complete by: As directed    Fluid consistency: Thin   Discharge instructions   Complete by: As directed    1. Follow up with your primary care provider in 1-2 weeks following discharge from hospital.   Increase activity slowly   Complete by: As directed    No wound care   Complete by: As directed       Allergies as of 04/13/2024       Reactions   Aspirin     Unknown reaction        Medication List     STOP taking these medications    guaiFENesin -dextromethorphan 100-10 MG/5ML syrup Commonly known as: ROBITUSSIN DM   meclizine 25 MG tablet Commonly known as: ANTIVERT   pantoprazole  40 MG tablet Commonly known as: Protonix        TAKE these medications    acetaminophen  325 MG tablet Commonly known as: TYLENOL  Take 2 tablets (650 mg  total) by mouth every 6 (six) hours as needed for mild pain (or Fever >/= 101).   amLODipine  5 MG tablet Commonly known as: NORVASC  Take 1 tablet (5 mg total) by mouth daily.   cefadroxil  500 MG capsule Commonly known as: DURICEF Take 2 capsules (1,000 mg total) by mouth 2 (two) times daily for 5 days.   feeding supplement Liqd Take 237 mLs by mouth 2 (two) times daily between meals. What changed: when to take this   latanoprost  0.005 % ophthalmic solution Commonly known as: XALATAN  Place 1 drop into both eyes at bedtime.  multivitamin with minerals tablet Take 1 tablet by mouth daily.   rivaroxaban  20 MG Tabs tablet Commonly known as: XARELTO  Take 20 mg by mouth in the morning.        Allergies  Allergen Reactions   Aspirin      Unknown reaction    Discharge Exam: Vitals:   04/13/24 0430 04/13/24 0740  BP: (!) 164/67 (!) 173/68  Pulse: 63 64  Resp: 18 16  Temp: 97.9 F (36.6 C) 97.7 F (36.5 C)  SpO2: 94% 96%    Physical Exam Vitals and nursing note reviewed.  Constitutional:      General: He is not in acute distress.    Appearance: He is not toxic-appearing or diaphoretic.  HENT:     Head: Normocephalic and atraumatic.  Eyes:     General: No scleral icterus. Cardiovascular:     Rate and Rhythm: Normal rate and regular rhythm.  Pulmonary:     Effort: Pulmonary effort is normal.  Abdominal:     General: Abdomen is flat. Bowel sounds are normal.  Musculoskeletal:     Right lower leg: No edema.     Left lower leg: No edema.  Skin:    General: Skin is warm and dry.     Capillary Refill: Capillary refill takes less than 2 seconds.  Neurological:     Mental Status: He is disoriented.     Comments: Pleasantly demented.     The results of significant diagnostics from this hospitalization (including imaging, microbiology, ancillary and laboratory) are listed below for reference.    Microbiology: Recent Results (from the past 240 hours)  Blood  Culture (routine x 2)     Status: Abnormal   Collection Time: 04/07/24  5:00 PM   Specimen: BLOOD RIGHT HAND  Result Value Ref Range Status   Specimen Description BLOOD RIGHT HAND  Final   Special Requests   Final    BOTTLES DRAWN AEROBIC AND ANAEROBIC Blood Culture adequate volume   Culture  Setup Time   Final    GRAM NEGATIVE RODS IN BOTH AEROBIC AND ANAEROBIC BOTTLES Organism ID to follow CRITICAL RESULT CALLED TO, READ BACK BY AND VERIFIED WITH: PHARMD J LEDFORD 04/08/2024 @ 0701 BY AB Performed at [facility], [address], [place], [place] [address]    Culture ESCHERICHIA COLI (A)  Final   Report Status 04/10/2024 FINAL  Final   Organism ID, Bacteria ESCHERICHIA COLI  Final      Susceptibility   Escherichia coli - MIC*    AMPICILLIN  <=2 SENSITIVE Sensitive     CEFAZOLIN  (NON-URINE) 2 SENSITIVE Sensitive     CEFEPIME  <=0.12 SENSITIVE Sensitive     ERTAPENEM <=0.12 SENSITIVE Sensitive     CEFTRIAXONE  <=0.25 SENSITIVE Sensitive     CIPROFLOXACIN <=0.06 SENSITIVE Sensitive     GENTAMICIN <=1 SENSITIVE Sensitive     MEROPENEM <=0.25 SENSITIVE Sensitive     TRIMETH/SULFA <=20 SENSITIVE Sensitive     AMPICILLIN /SULBACTAM <=2 SENSITIVE Sensitive     PIP/TAZO Value in next row Sensitive ug/mL     <=4 SENSITIVEThis is a modified FDA-approved test that has been validated and its performance characteristics determined by the reporting laboratory.  This laboratory is certified under the Clinical Laboratory Improvement Amendments CLIA as qualified to perform high complexity clinical laboratory testing.    * ESCHERICHIA COLI  Blood Culture ID Panel (Reflexed)     Status: Abnormal   Collection Time: 04/07/24  5:00 PM  Result Value Ref Range Status  Enterococcus faecalis NOT DETECTED NOT DETECTED Final   Enterococcus Faecium NOT DETECTED NOT DETECTED Final   Listeria monocytogenes NOT DETECTED NOT DETECTED Final   Staphylococcus species NOT DETECTED NOT DETECTED Final    Staphylococcus aureus (BCID) NOT DETECTED NOT DETECTED Final   Staphylococcus epidermidis NOT DETECTED NOT DETECTED Final   Staphylococcus lugdunensis NOT DETECTED NOT DETECTED Final   Streptococcus species NOT DETECTED NOT DETECTED Final   Streptococcus agalactiae NOT DETECTED NOT DETECTED Final   Streptococcus pneumoniae NOT DETECTED NOT DETECTED Final   Streptococcus pyogenes NOT DETECTED NOT DETECTED Final   A.calcoaceticus-baumannii NOT DETECTED NOT DETECTED Final   Bacteroides fragilis NOT DETECTED NOT DETECTED Final   Enterobacterales DETECTED (A) NOT DETECTED Final    Comment: Enterobacterales represent a large order of gram negative bacteria, not a single organism. CRITICAL RESULT CALLED TO, READ BACK BY AND VERIFIED WITH: PHARMD J LEDFORD 04/08/2024 @ 0701 BY AB    Enterobacter cloacae complex NOT DETECTED NOT DETECTED Final   Escherichia coli DETECTED (A) NOT DETECTED Final    Comment: CRITICAL RESULT CALLED TO, READ BACK BY AND VERIFIED WITH: PHARMD J LEDFORD 04/08/2024 @ 0701 BY AB    Klebsiella aerogenes NOT DETECTED NOT DETECTED Final   Klebsiella oxytoca NOT DETECTED NOT DETECTED Final   Klebsiella pneumoniae NOT DETECTED NOT DETECTED Final   Proteus species NOT DETECTED NOT DETECTED Final   Salmonella species NOT DETECTED NOT DETECTED Final   Serratia marcescens NOT DETECTED NOT DETECTED Final   Haemophilus influenzae NOT DETECTED NOT DETECTED Final   Neisseria meningitidis NOT DETECTED NOT DETECTED Final   Pseudomonas aeruginosa NOT DETECTED NOT DETECTED Final   Stenotrophomonas maltophilia NOT DETECTED NOT DETECTED Final   Candida albicans NOT DETECTED NOT DETECTED Final   Candida auris NOT DETECTED NOT DETECTED Final   Candida glabrata NOT DETECTED NOT DETECTED Final   Candida krusei NOT DETECTED NOT DETECTED Final   Candida parapsilosis NOT DETECTED NOT DETECTED Final   Candida tropicalis NOT DETECTED NOT DETECTED Final   Cryptococcus neoformans/gattii NOT  DETECTED NOT DETECTED Final   CTX-M ESBL NOT DETECTED NOT DETECTED Final   Carbapenem resistance IMP NOT DETECTED NOT DETECTED Final   Carbapenem resistance KPC NOT DETECTED NOT DETECTED Final   Carbapenem resistance NDM NOT DETECTED NOT DETECTED Final   Carbapenem resist OXA 48 LIKE NOT DETECTED NOT DETECTED Final   Carbapenem resistance VIM NOT DETECTED NOT DETECTED Final    Comment: Performed at [facility], [address] [place], [place] [address]  Blood Culture (routine x 2)     Status: Abnormal   Collection Time: 04/07/24  5:14 PM   Specimen: BLOOD RIGHT FOREARM  Result Value Ref Range Status   Specimen Description BLOOD RIGHT FOREARM  Final   Special Requests   Final    BOTTLES DRAWN AEROBIC ONLY Blood Culture results may not be optimal due to an inadequate volume of blood received in culture bottles   Culture  Setup Time   Final    GRAM NEGATIVE RODS AEROBIC BOTTLE ONLY CRITICAL VALUE NOTED.  VALUE IS CONSISTENT WITH PREVIOUSLY REPORTED AND CALLED VALUE.    Culture (A)  Final    ESCHERICHIA COLI SUSCEPTIBILITIES PERFORMED ON PREVIOUS CULTURE WITHIN THE LAST 5 DAYS. Performed at [facility], [address] [place], [place] [address]    Report Status 04/10/2024 FINAL  Final  Resp panel by RT-PCR (RSV, Flu A&B, Covid) Anterior Nasal Swab     Status: Abnormal  Collection Time: 04/07/24  5:21 PM   Specimen: Anterior Nasal Swab  Result Value Ref Range Status   SARS Coronavirus 2 by RT PCR POSITIVE (A) NEGATIVE Final   Influenza A by PCR NEGATIVE NEGATIVE Final   Influenza B by PCR NEGATIVE NEGATIVE Final    Comment: (NOTE) The Xpert Xpress SARS-CoV-2/FLU/RSV plus assay is intended as an aid in the diagnosis of influenza from Nasopharyngeal swab specimens and should not be used as a sole basis for treatment. Nasal washings and aspirates are unacceptable for Xpert Xpress SARS-CoV-2/FLU/RSV testing.  Fact Sheet for  Patients: BloggerCourse.com  Fact Sheet for Healthcare Providers: SeriousBroker.it  This test is not yet approved or cleared by the [place]  FDA and has been authorized for detection and/or diagnosis of SARS-CoV-2 by FDA under an Emergency Use Authorization (EUA). This EUA will remain in effect (meaning this test can be used) for the duration of the COVID-19 declaration under Section 564(b)(1) of the Act, 21 U.S.C. section 360bbb-3(b)(1), unless the authorization is terminated or revoked.     Resp Syncytial Virus by PCR NEGATIVE NEGATIVE Final    Comment: (NOTE) Fact Sheet for Patients: BloggerCourse.com  Fact Sheet for Healthcare Providers: SeriousBroker.it  This test is not yet approved or cleared by the [place]  FDA and has been authorized for detection and/or diagnosis of SARS-CoV-2 by FDA under an Emergency Use Authorization (EUA). This EUA will remain in effect (meaning this test can be used) for the duration of the COVID-19 declaration under Section 564(b)(1) of the Act, 21 U.S.C. section 360bbb-3(b)(1), unless the authorization is terminated or revoked.  Performed at [facility], [address] [place], [place] [address]   Urine Culture     Status: Abnormal   Collection Time: 04/07/24  8:38 PM   Specimen: Urine, Random  Result Value Ref Range Status   Specimen Description URINE, RANDOM  Final   Special Requests NONE Reflexed from [id number]  Final   Culture (A)  Final    <10,000 COLONIES/mL INSIGNIFICANT GROWTH Performed at [facility], [address] [place], [place] [address]    Report Status 04/08/2024 FINAL  Final  MRSA Next Gen by PCR, Nasal     Status: None   Collection Time: 04/07/24 11:14 PM   Specimen: Nasal Mucosa; Nasal Swab  Result Value Ref Range Status   MRSA by PCR Next Gen NOT DETECTED NOT DETECTED Final    Comment:  (NOTE) The GeneXpert MRSA Assay (FDA approved for NASAL specimens only), is one component of a comprehensive MRSA colonization surveillance program. It is not intended to diagnose MRSA infection nor to guide or monitor treatment for MRSA infections. Test performance is not FDA approved in patients less than 83 years old. Performed at [facility], [address] [place], [place] [address]      Labs: Basic Metabolic Panel: Recent Labs  Lab 04/07/24 1933 04/08/24 9677 04/09/24 0228 04/10/24 0239 04/11/24 0219 04/12/24 0442 04/13/24 0249  NA 134* 135 137 139 140  --   --   K 5.1 4.5 4.4 4.5 4.5  --   --   CL 102 105 106 111 108  --   --   CO2 23 22 22  20* 20*  --   --   GLUCOSE 101* 104* 95 104* 95  --   --   BUN 29* 30* 27* 21 15  --   --   CREATININE 1.80* 1.63* 1.31* 1.21 1.01  --   --  CALCIUM 9.5 8.8* 8.8* 9.1 8.6*  --   --   MG  --   --  1.7 1.8 1.7 1.6* 2.1   Liver Function Tests: Recent Labs  Lab 04/07/24 1933 04/08/24 0322  AST 20 16  ALT 12 10  ALKPHOS 40 34*  BILITOT 1.4* 1.4*  PROT 7.1 5.7*  ALBUMIN 2.8* 2.2*   CBC: Recent Labs  Lab 04/07/24 1638 04/08/24 0322 04/09/24 0228 04/10/24 0239 04/11/24 0219  WBC 16.0* 12.6* 6.4 4.6 6.4  NEUTROABS 14.0*  --   --   --   --   HGB 11.6* 9.1* 9.6* 9.4* 9.8*  HCT 37.5* 28.9* 30.5* 30.5* 32.1*  MCV 92.4 88.4 88.9 89.7 90.4  PLT 216 159 149* 171 186   CBG: Recent Labs  Lab 04/08/24 1421  GLUCAP 89   Urinalysis    Component Value Date/Time   COLORURINE YELLOW 04/07/2024 2038   APPEARANCEUR CLOUDY (A) 04/07/2024 2038   LABSPEC 1.013 04/07/2024 2038   PHURINE 7.0 04/07/2024 2038   GLUCOSEU NEGATIVE 04/07/2024 2038   HGBUR MODERATE (A) 04/07/2024 2038   BILIRUBINUR NEGATIVE 04/07/2024 2038   KETONESUR NEGATIVE 04/07/2024 2038   PROTEINUR 30 (A) 04/07/2024 2038   NITRITE POSITIVE (A) 04/07/2024 2038   LEUKOCYTESUR LARGE (A) 04/07/2024 2038   Sepsis Labs Recent Labs  Lab 04/08/24 0322  04/09/24 0228 04/10/24 0239 04/11/24 0219  WBC 12.6* 6.4 4.6 6.4    Procedures/Studies: CT RENAL STONE STUDY Result Date: 04/07/2024 CLINICAL DATA:  Abdominal/flank pain.  Stone suspected. EXAM: CT ABDOMEN AND PELVIS WITHOUT CONTRAST TECHNIQUE: Multidetector CT imaging of the abdomen and pelvis was performed following the standard protocol without IV contrast. RADIATION DOSE REDUCTION: This exam was performed according to the departmental dose-optimization program which includes automated exposure control, adjustment of the mA and/or kV according to patient size and/or use of iterative reconstruction technique. COMPARISON:  CT without contrast 03/23/2024, PET-CT 01/29/2022. FINDINGS: Lower chest: Lung bases are emphysematous but clear. Small hiatal hernia. The heart is slightly enlarged. There are scattered three-vessel coronary calcifications. The aortic valve leaflets are heavily calcified. Interval resolved prior trace right pleural effusion. Hepatobiliary: Respiratory motion obscures fine detail. There is no obvious liver mass. Unremarkable gallbladder bile ducts, as visualized. Pancreas: No focal abnormality is seen through the motion artifact. Spleen: No focal abnormality is seen through the motion artifact. Adrenals/Urinary Tract: No adrenal mass. There are bilateral Bosniak 1 renal cysts, more on the left, largest of these is 4.7 cm and 0.9 Hounsfield units left upper pole, on the right the largest is 2.6 cm and 17 Hounsfield units in the superior pole. No follow-up imaging is recommended. There is asymmetric increased right perinephric stranding, small amount of fluid now seen collecting in the lower posterior right perirenal space. Mild hydroureteronephrosis continues to be seen on the right to the bladder base, without obstructing stone identified. No intrarenal stones are seen bilaterally. No nephrolithiasis was seen on either prior study. Findings could indicate evidence of an ascending  right-sided UTI with pyelonephritis, sequela of a recently passed stone, or a distal right ureteral stricture or neoplasm causing obstructive uropathy. The hydronephrosis has slightly increased since August 8. Again noted is mild bladder thickening versus underdistention. Correlate clinically for underlying cystitis. Stomach/Bowel: No dilatation or overt wall thickening. Again the cecum and appendix are contained in a moderate-sized right inguinal hernia sac through a defect of 2.9 cm on 3:64. No upstream obstruction is seen. The ileocecal junction is also within the hernia sac.  There is mild-to-moderate fecal stasis in the distal 2/3 of the colon, increased retained stool in the rectum. No evidence of focal colitis or diverticulitis. Vascular/Lymphatic: Aortic atherosclerosis. No enlarged abdominal or pelvic lymph nodes. Reproductive: Prior prostate brachytherapy. No mass is seen in the prostate bed. Both testicles are in the scrotal sac. Other: Addition to a right inguinal hernia there is a small left inguinal fat hernia and a small umbilical fat hernia. There are no findings of hernia incarceration, no wall thickening in the cecum and terminal ileum within the hernia sac. There is trace posterior deep pelvic ascites. No free hemorrhage, free air or abscess. Musculoskeletal: Osteopenia and degenerative change lumbar spine with bilateral SI joint ankylosis. Chronic compression fractures again noted L1 and 2. There are bridging syndesmophytes in the lower thoracic spine. Does the patient have ankylosing spondylitis? No acute or other significant osseous findings. IMPRESSION: 1. Increased right hydroureteronephrosis to the bladder base, without obstructing stone identified. Findings could indicate evidence of an ascending right-sided UTI with pyelonephritis, sequela of a recently passed stone, or a distal right ureteral stricture or neoplasm causing obstructive uropathy. 2. No urinary stone was seen on the current or  prior studies. 3. Asymmetric increased right perinephric stranding and small amount of fluid in the lower posterior right perirenal space. 4. Cystitis versus bladder nondistention. 5. Constipation and diverticulosis. 6. Right inguinal hernia containing the cecum, ileocecal junction and appendix, without evidence of incarceration or up strain obstruction. 7. Aortic and coronary artery atherosclerosis. 8. Emphysema. 9. Osteopenia and degenerative change with chronic compression fractures L1 and L2. 10. Bridging syndesmophytes in the lower thoracic spine and bilateral SI joint ankylosis. Does the patient have ankylosing spondylitis? Aortic Atherosclerosis (ICD10-I70.0) and Emphysema (ICD10-J43.9). Electronically Signed   By: Francis Quam M.D.   On: 04/07/2024 22:43   DG Chest Port 1 View Result Date: 04/07/2024 CLINICAL DATA:  Sepsis. EXAM: PORTABLE CHEST 1 VIEW COMPARISON:  03/19/2024 FINDINGS: Stable cardiomegaly. Mild scarring seen in both lung bases. No evidence of acute infiltrate or pleural effusion. IMPRESSION: Stable cardiomegaly and bibasilar scarring. No acute findings. Electronically Signed   By: Norleen DELENA Kil M.D.   On: 04/07/2024 17:36   CT RENAL STONE STUDY Result Date: 03/23/2024 CLINICAL DATA:  Right-sided hydronephrosis. EXAM: CT ABDOMEN AND PELVIS WITHOUT CONTRAST TECHNIQUE: Multidetector CT imaging of the abdomen and pelvis was performed following the standard protocol without IV contrast. RADIATION DOSE REDUCTION: This exam was performed according to the departmental dose-optimization program which includes automated exposure control, adjustment of the mA and/or kV according to patient size and/or use of iterative reconstruction technique. COMPARISON:  CT abdomen and pelvis 08/16/2019 FINDINGS: Lower chest: Moderate emphysema present. There is a trace right pleural effusion. Hepatobiliary: No focal liver abnormality is seen. No gallstones, gallbladder wall thickening, or biliary dilatation.  Pancreas: Unremarkable. No pancreatic ductal dilatation or surrounding inflammatory changes. Spleen: Normal in size without focal abnormality. Adrenals/Urinary Tract: There is mild right-sided hydroureteronephrosis to the level of the bladder. No obstructing calculus identified. The bladder appears normal. Bilateral renal cysts are present, left greater than right. The largest simple cyst in the left kidney measures 3.2 cm. There is a rounded mildly hyperdense area in the superior pole the left kidney measuring 4.7 cm. This has mildly increased in size compared to the prior study. The adrenal glands are within normal limits. Stomach/Bowel: No evidence of bowel wall thickening, distention, or inflammatory changes. The appendix is not visualized. There are air-fluid levels scattered throughout the colon. There  is a small hiatal hernia. The stomach is otherwise within normal limits. Vascular/Lymphatic: Aortic atherosclerosis. No enlarged abdominal or pelvic lymph nodes. Reproductive: Prostate radiotherapy seeds are present. Other: There is a large right inguinal hernia containing the cecum. There is a small fat containing left inguinal hernia. There is no ascites or free intraperitoneal air. There is mild presacral edema. Musculoskeletal: The bones are diffusely osteopenic. Degenerative changes affect the spine. Chronic compression deformities of L1 and L2 appear unchanged. IMPRESSION: 1. Mild right-sided hydroureteronephrosis to the level of the bladder. No obstructing calculus identified. Findings are indeterminate and may be related to recently passed calculus or distal stricture or mass involving the ureter. 2. Large right inguinal hernia containing the cecum. No evidence for bowel obstruction. 3. Air-fluid levels in the colon can be seen with diarrheal illness. 4. Trace right pleural effusion. 5. 4.7 cm rounded mildly hyperdense area in the superior pole of the left kidney has mildly increased in size compared to  the prior study. This may represent a hemorrhagic or proteinaceous cyst, but is indeterminate. Recommend further evaluation with ultrasound or MRI. 6. Aortic atherosclerosis. Aortic Atherosclerosis (ICD10-I70.0). Electronically Signed   By: Greig Pique M.D.   On: 03/23/2024 18:49   US  RENAL Result Date: 03/23/2024 CLINICAL DATA:  Acute kidney injury. EXAM: RENAL / URINARY TRACT ULTRASOUND COMPLETE COMPARISON:  Abdomen and pelvis CT dated 08/16/2019. PET-CT dated 01/29/2022. FINDINGS: Right Kidney: Renal measurements: 8.8 x 5.4 x 5.4 cm = volume: 132 mL. 7 mm simple cyst not requiring imaging follow-up. Mildly echogenic. Mild-to-moderate dilatation of the right renal pelvis and proximal ureter, not previously present. Left Kidney: Renal measurements: 8.9 x 5.5 x 5.1 cm = volume: 129 mL. Mildly echogenic. Simple appearing cysts, not requiring imaging follow-up. No hydronephrosis. Bladder: Appears normal for degree of bladder distention. Calculated postvoid residual of 90.1 cc. Other: The examination was limited by the patient's inability to hold his breath and difficulty repositioning for the scan. IMPRESSION: 1. Mild-to-moderate right hydronephrosis and proximal ureteral dilatation, not previously present. This is concerning for a nonvisualized ureteral calculus or mass more distally. This could be further evaluated with an abdomen and pelvis CT without contrast. 2. Mildly echogenic kidneys, consistent with medical renal disease. 3. Calculated postvoid residual of 90.1 cc. Electronically Signed   By: Elspeth Bathe M.D.   On: 03/23/2024 11:33   ECHOCARDIOGRAM COMPLETE Result Date: 03/21/2024    ECHOCARDIOGRAM REPORT   Patient Name:   KACYN SOUDER Date of Exam: 03/21/2024 Medical Rec #:  [id number]      Height:       69.0 in Accession #:    [id number]     Weight:       157.8 lb Date of Birth:  Dec 14, 1933      BSA:          1.868 m Patient Age:    89 years       BP:           163/65 mmHg Patient Gender: M               HR:           101 bpm. Exam Location:  Inpatient Procedure: 2D Echo, Cardiac Doppler, Color Doppler and Intracardiac            Opacification Agent (Both Spectral and Color Flow Doppler were            utilized during procedure). Indications:    Syncope  History:  Patient has prior history of Echocardiogram examinations, most                 recent 04/06/2018. Signs/Symptoms:Syncope; Risk                 Factors:Dyslipidemia.  Sonographer:    Therisa Crouch Referring Phys: [id number] VASUNDHRA RATHORE IMPRESSIONS  1. Left ventricular ejection fraction, by estimation, is 65 to 70%. The left ventricle has normal function. The left ventricle has no regional wall motion abnormalities. There is mild left ventricular hypertrophy. Left ventricular diastolic parameters are consistent with Grade I diastolic dysfunction (impaired relaxation).  2. Right ventricular systolic function is normal. The right ventricular size is normal. Tricuspid regurgitation signal is inadequate for assessing PA pressure.  3. The mitral valve is grossly normal. Trivial mitral valve regurgitation. No evidence of mitral stenosis.  4. The aortic valve is abnormal. There is moderate calcification of the aortic valve. Aortic valve regurgitation is mild. Mild aortic valve stenosis. Aortic valve area, by VTI measures 2.14 cm. Aortic valve mean gradient measures 10.3 mmHg. Aortic valve Vmax measures 2.17 m/s.  5. Aortic dilatation noted. Aneurysm of the ascending aorta, measuring 46 mm. There is moderate dilatation of the ascending aorta.  6. The inferior vena cava is normal in size with greater than 50% respiratory variability, suggesting right atrial pressure of 3 mmHg. FINDINGS  Left Ventricle: Left ventricular ejection fraction, by estimation, is 65 to 70%. The left ventricle has normal function. The left ventricle has no regional wall motion abnormalities. The left ventricular internal cavity size was normal in size. There is  mild left  ventricular hypertrophy. Left ventricular diastolic parameters are consistent with Grade I diastolic dysfunction (impaired relaxation). Right Ventricle: The right ventricular size is normal. No increase in right ventricular wall thickness. Right ventricular systolic function is normal. Tricuspid regurgitation signal is inadequate for assessing PA pressure. Left Atrium: Left atrial size was normal in size. Right Atrium: Right atrial size was normal in size. Pericardium: There is no evidence of pericardial effusion. Mitral Valve: The mitral valve is grossly normal. Trivial mitral valve regurgitation. No evidence of mitral valve stenosis. Tricuspid Valve: The tricuspid valve is normal in structure. Tricuspid valve regurgitation is not demonstrated. No evidence of tricuspid stenosis. Aortic Valve: The aortic valve is abnormal. There is moderate calcification of the aortic valve. Aortic valve regurgitation is mild. Aortic regurgitation PHT measures 564 msec. Mild aortic stenosis is present. Aortic valve mean gradient measures 10.3 mmHg. Aortic valve peak gradient measures 18.8 mmHg. Aortic valve area, by VTI measures 2.14 cm. Pulmonic Valve: The pulmonic valve was normal in structure. Pulmonic valve regurgitation is not visualized. No evidence of pulmonic stenosis. Aorta: Aortic dilatation noted. There is moderate dilatation of the ascending aorta. There is an aneurysm involving the ascending aorta measuring 46 mm. Venous: The inferior vena cava is normal in size with greater than 50% respiratory variability, suggesting right atrial pressure of 3 mmHg. IAS/Shunts: No atrial level shunt detected by color flow Doppler.  LEFT VENTRICLE PLAX 2D LVIDd:         4.30 cm     Diastology LVIDs:         2.90 cm     LV e' medial:    5.75 cm/s LV PW:         1.20 cm     LV E/e' medial:  13.3 LV IVS:        1.00 cm     LV e' lateral:  7.77 cm/s LVOT diam:     2.30 cm     LV E/e' lateral: 9.8 LV SV:         106 LV SV Index:   57 LVOT  Area:     4.15 cm  LV Volumes (MOD) LV vol d, MOD A2C: 60.8 ml LV vol d, MOD A4C: 69.0 ml LV vol s, MOD A2C: 25.4 ml LV vol s, MOD A4C: 25.0 ml LV SV MOD A2C:     35.4 ml LV SV MOD A4C:     69.0 ml LV SV MOD BP:      41.8 ml RIGHT VENTRICLE          IVC RV Basal diam:  3.40 cm  IVC diam: 1.30 cm LEFT ATRIUM             Index LA diam:        3.80 cm 2.03 cm/m LA Vol (A2C):   41.1 ml 22.00 ml/m LA Vol (A4C):   87.4 ml 46.78 ml/m LA Biplane Vol: 60.4 ml 32.33 ml/m  AORTIC VALVE AV Area (Vmax):    1.98 cm AV Area (Vmean):   1.94 cm AV Area (VTI):     2.14 cm AV Vmax:           216.67 cm/s AV Vmean:          148.333 cm/s AV VTI:            0.493 m AV Peak Grad:      18.8 mmHg AV Mean Grad:      10.3 mmHg LVOT Vmax:         103.00 cm/s LVOT Vmean:        69.250 cm/s LVOT VTI:          0.254 m LVOT/AV VTI ratio: 0.52 AI PHT:            564 msec  AORTA Ao Root diam: 3.70 cm Ao Asc diam:  4.60 cm MITRAL VALVE MV Area (PHT): 2.29 cm     SHUNTS MV Decel Time: 332 msec     Systemic VTI:  0.25 m MV E velocity: 76.40 cm/s   Systemic Diam: 2.30 cm MV A velocity: 106.00 cm/s MV E/A ratio:  0.72 Soyla Merck MD Electronically signed by Soyla Merck MD Signature Date/Time: 03/21/2024/4:50:57 PM    Final    EEG adult Result Date: 03/20/2024 Gregg Lek, MD     03/20/2024  9:24 AM Patient Name: Anton Cheramie MRN: [id number] Epilepsy Attending: Lek Gregg Referring Physician/Provider: No ref. provider found     Date: 03/20/2024 Duration: 23 minutes Patient history: 89 y.o. male with medical history significant of sinus bradycardia, hyperlipidemia, thoracic ascending aortic aneurysm, history of DVT/PE on Xarelto , bladder and prostate cancer, dementia, chronic normocytic anemia presents to the ED via EMS for evaluation of generalized weakness and syncope. EEG to evaluate for seizure. Level of alertness: Awake, drowsy, AEDs during EEG study: None Technical aspects: This EEG study was done with scalp electrodes positioned  according to the 10-20 International system of electrode placement. Electrical activity was reviewed with band pass filter of 1-70Hz , sensitivity of 7 uV/mm, display speed of 94mm/sec with a 60Hz  notched filter applied as appropriate. EEG data were recorded continuously and digitally stored.  Video monitoring was available and reviewed as appropriate. Description: The posterior dominant rhythm consists of 6-7 Hz activity of moderate voltage (25-35 uV) seen predominantly in posterior head regions, symmetric and reactive to eye opening and eye closing.  Drowsiness was characterized by attenuation of the posterior background rhythm. Sleep was not seen.  Hyperventilation and photic stimulation were not performed.   ABNORMALITY - Continuous slow, generalized IMPRESSION: This study is suggestive of mild diffuse encephalopathy, nonspecific etiology but likely related to sedation, toxic-metabolic etiology. No seizures or epileptiform discharges were seen throughout the recording. Pastor Falling MD Neurology    CT HEAD WO CONTRAST ( ) Result Date: 03/20/2024 EXAM: CT HEAD WITHOUT 03/20/2024 02:38:25 AM TECHNIQUE: CT of the head was performed without the administration of intravenous contrast. Automated exposure control, iterative reconstruction, and/or weight based adjustment of the mA/kV was utilized to reduce the radiation dose to as low as reasonably achievable. COMPARISON: 03/26/2022 CLINICAL HISTORY: Syncope/presyncope, cerebrovascular cause suspected. FINDINGS: BRAIN AND VENTRICLES: No acute intracranial hemorrhage. No mass effect or midline shift. No extra-axial fluid collection. Gray-white differentiation is maintained. No hydrocephalus. Generalized volume loss. Chronic ischemic white matter changes. ORBITS: No acute abnormality. SINUSES AND MASTOIDS: No acute abnormality. SOFT TISSUES AND SKULL: No acute skull fracture. No acute soft tissue abnormality. IMPRESSION: 1. No acute intracranial abnormality. 2.  Generalized volume loss and chronic ischemic white matter changes. Electronically signed by: Franky Stanford MD 03/20/2024 02:54 AM EDT RP Workstation: [id number]   DG Chest Port 1 View Result Date: 03/19/2024 EXAM: 1 VIEW XRAY OF THE CHEST 03/19/2024 06:58:00 PM COMPARISON: 06/16/2022 CLINICAL HISTORY: Syncope. Triage notes: PT BIB EMS, initially called out for weakness, then had a syncopal episode, but per family is back to baseline, history of dementia. Family advised EMS that he has not been eating and drinking.; BP 152/90; HR 76; O2 96% RA; CBG 121 FINDINGS: LUNGS AND PLEURA: Hazy airspace and interstitial opacities in the bilateral lower lungs, greater on the left than the right, suspicious for pneumonia and/or aspiration. No pleural effusion. No pneumothorax. HEART AND MEDIASTINUM: Stable cardiomediastinal silhouette. BONES AND SOFT TISSUES: No acute osseous abnormality. IMPRESSION: 1. Hazy airspace and interstitial opacities in the bilateral lower lungs, suspicious for pneumonia and/or aspiration. Electronically signed by: Norman Gatlin MD 03/19/2024 07:56 PM EDT RP Workstation: [id number]    Time coordinating discharge: 60 mins  SIGNED:  Camellia Door, DO [facility] 04/13/24, 1:01 PM

## 2024-04-13 NOTE — TOC Transition Note (Signed)
 Transition of Care [facility]) - Discharge Note   Patient Details  Name: Connor Morris MRN: [id number] Date of Birth: 1934/03/16  [facility] CM/SW Contact:  Jeoffrey LITTIE Maranda ISRAEL Phone Number: 04/13/2024, 2:13 PM   Clinical Narrative:    Patient will DC to: [facility]  Anticipated DC date: 04/13/24 Family notified: Yes Transport by: [facility]   Per MD patient ready for DC to [facility]. RN to call report prior to discharge [phone] Room 110. RN, patient, patient's family, and facility notified of DC. Discharge Summary and FL2 sent to facility. DC packet on chart. Ambulance transport requested for patient.   CSW will sign off for now as social work intervention is no longer needed. Please consult us  again if new needs arise.     Final next level of care: Skilled Nursing Facility Barriers to Discharge: Barriers Resolved   Patient Goals and CMS Choice Patient states their goals for this hospitalization and ongoing recovery are:: SNF CMS Medicare.gov Compare Post Acute Care list provided to:: Patient Represenative (must comment) Choice offered to / list presented to : Adult Children      Discharge Placement   Existing PASRR number confirmed : 04/13/24          Patient [facility] bed at: [facility] Patient to be transferred to facility by: [facility] Name of family member notified: [name] Patient and family notified of of transfer: 04/13/24  Discharge Plan and Services Additional resources added to the After Visit Summary for   In-house Referral: Clinical Social Work   Post Acute Care Choice: Skilled Nursing Facility                               Social Drivers of Health (SDOH) Interventions SDOH Screenings   Food Insecurity: No Food Insecurity (04/07/2024)  Housing: Low Risk  (04/07/2024)  Transportation Needs: No Transportation Needs (04/07/2024)  Utilities: Not At Risk (04/07/2024)  Social Connections: Socially Isolated (04/07/2024)  Tobacco Use: Medium Risk  (04/07/2024)     Readmission Risk Interventions    03/20/2024    3:34 PM 03/10/2022    9:39 AM  Readmission Risk Prevention Plan  Post Dischage Appt  Complete  Medication Screening  Complete  Transportation Screening Complete Complete  PCP or Specialist Appt within 5-7 Days Complete   Home Care Screening Complete   Medication Review (RN CM) Complete

## 2024-04-13 NOTE — Progress Notes (Signed)
 PROGRESS NOTE    Connor Morris  FMW:[id number] DOB: [birth year] DOA: 04/07/2024 PCP: Larnell Hamilton, MD  Subjective: Pt seen and examined. Pleasantly demented. TOC says he has bed available at [facility] today.   Hospital Course: CC: lethargy, shaking chills HPI: Connor Morris is a 89 y.o. male with medical history significant for hypertension, history of DVT and PE on Xarelto , bladder cancer status post TURBT, and dementia who presents for evaluation of lethargy, shaking chills, and loss of appetite.   Patient was too generally weak and fatigued to participate with home PT yesterday.  He became progressively lethargic at home, was noted to be febrile, and developed shaking chills.     ED Course: Upon arrival to the ED, patient is found to be febrile to 40.1 C and saturating well on room air with normal HR and stable BP.  Labs are most notable for creatinine 1.80, albumin 2.8, WBC 16,000, lactic acid 3.6, and INR 6.1.  UA notable for bacteriuria, pyuria, and positive nitrites.  Chest x-ray is negative for acute findings.   Blood and urine cultures were collected and the patient was given 500 mL of LR, acetaminophen , cefepime , and Flagyl .    Significant Events: Admitted 04/07/2024 for UTI, severe sepsis 04-08-2024 blood cx turn positive for E. Coli in less than 24 hours. Pt seen by palliative Care Consult 04-10-2024 pt seen by EP/Cards consult. Started on po Duricef. IV Cefepime  stopped. 04-11-2024 transferred out of progressive care bed to med/surg bed  Admission Labs: WBC 16K, HgB 11.6, plt 216 INR 6.1 Lactic acid 3.6 Na 134, K 5.1, CO2 of 23, BUN 29, Scr 1.8, glu 101  Admission Imaging Studies: CXR Stable cardiomegaly and bibasilar scarring. No acute findings.  CT renal stone Increased right hydroureteronephrosis to the bladder base, without obstructing stone identified. Findings could indicate evidence of an ascending right-sided UTI with pyelonephritis, sequela of a  recently passed stone, or a distal right ureteral stricture or neoplasm causing obstructive uropathy. 2. No urinary stone was seen on the current or prior studies. 3. Asymmetric increased right perinephric stranding and small amount of fluid in the lower posterior right perirenal space. 4. Cystitis versus bladder nondistention. 5. Constipation and diverticulosis. 6. Right inguinal hernia containing the cecum, ileocecal junction and appendix, without evidence of incarceration or up strain obstruction. 7. Aortic and coronary artery atherosclerosis. 8. Emphysema. 9. Osteopenia and degenerative change with chronic compression fractures L1 and L2. 10. 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 Coli 04-09-2024 CRP 21.7  Significant Imaging Studies:   Antibiotic Therapy: Anti-infectives (From admission, onward)    Start     Dose/Rate Route Frequency Ordered Stop   04/10/24 1215  cefadroxil  (DURICEF) capsule 1,000 mg        1,000 mg Oral 2 times daily 04/10/24 1128 04/14/24 0959   04/08/24 1000  cefTRIAXone  (ROCEPHIN ) 2 g in sodium chloride  0.9 % 100 mL IVPB  Status:  Discontinued        2 g 200 mL/hr over 30 Minutes Intravenous Every 24 hours 04/08/24 0722 04/10/24 1128   04/08/24 0600  ceFEPIme  (MAXIPIME ) 2 g in sodium chloride  0.9 % 100 mL IVPB  Status:  Discontinued        2 g 200 mL/hr over 30 Minutes Intravenous Every 12 hours 04/07/24 2129 04/08/24 0722   04/07/24 1800  ceFEPIme  (MAXIPIME ) 2 g in sodium chloride  0.9 % 100 mL IVPB  2 g 200 mL/hr over 30 Minutes Intravenous  Once 04/07/24 1752 04/07/24 1900   04/07/24 1800  metroNIDAZOLE  (FLAGYL ) IVPB 500 mg        500 mg 100 mL/hr over 60 Minutes Intravenous  Once 04/07/24 1752 04/07/24 1949       Procedures:   Consultants: Palliative care EP/Cards    Assessment and Plan: * Sepsis secondary to UTI (HCC) 04-11-2024  severe sepsis present on admission. Lactic acid 3.6 on admission. WBC 16K on admission. Temp 103F.  Acute pyelonephritis 04-11-2024 causes E. Coli septicemia. E. Coli is pan sensitive. Completed 2 days of IV Rocephin . On PO Duricef Day # 2.  04-12-2024 given his severe sepsis on admission, will treat with a total of 10 days of abx therapy.  He received 2 days of IV rocephin . Today is Day #3/8 of Duricef.  Plan a total of 8 days of po Duricef.  04-13-2024 complete 5 additional days of po Duricef 1000 mg bid at [facility]. This will make a total of 10 days of abx therapy to treat his E. Coli septicemia/pyelonephritis  E. coli septicemia (HCC) 04-11-2024 likely caused by pyelonephritis. E. Coli is pan sensitive. Completed 2 days of IV Rocephin . On PO Duricef Day # 2.  04-12-2024 given his severe sepsis on admission, will treat with a total of 10 days of abx therapy.  He received 2 days of IV rocephin . Today is Day #3/8 of Duricef.  Plan a total of 8 days of po Duricef.  04-13-2024 complete 5 additional days of po Duricef 1000 mg bid at [facility]. This will make a total of 10 days of abx therapy to treat his E. Coli septicemia/pyelonephritis  Severe sepsis (HCC)-resolved as of 04/13/2024 04-11-2024 severe sepsis present on admission. Lactic acid 3.6 on admission. WBC 16K on admission. Temp 103F.  Acute renal failure superimposed on stage 3a chronic kidney disease (HCC)-resolved as of 04/12/2024 04-11-2024 Scr down to 1.01. was admitted with Scr of 1.81 and treated with IVF.  can stop IVF today. AKI is now resolved.  DNR (do not resuscitate)DNI(Do Not Intubate) 04-11-2024 pt made DNR/DNI on admission.    Primary hypertension - Due to bradycardia, betablockers and non-dihydropyridines(I.e. cardizem, verapamil) are contraindicated. 04-11-2024 BP stable. On norvasc  and hydralazine . Due to bradycardia, betablockers and non-dihydropyridines(I.e. cardizem, verapamil) are contraindicated.  04-12-2024 on norvasc  5  mg daily. Will start scheduled hydralazine  25 mg tid to help with HTN control.  04-13-2024 continue with norvasc  5 mg daily and hydralazine  25 mg tid.    Dementia without behavioral disturbance (HCC) 04-11-2024 stable. Will need SNF placement at discharge.  04-12-2024 stable. TOC working on SNF placement.  04-13-2024 DC to [facility] today.  Current use of long term anticoagulation 04-11-2024 back on xarelto  due to prior hx of PE and DVT  04-12-2024 stable on Xarelto .  04-13-2024 continue xarelto    History of pulmonary embolism 04-11-2024 Xarelto  was held on admission due to elevated INR. Now back on Xarelto .  04-12-2024 discussed with anticoagulation specialist pharmacist. INR should not be used to monitor Xarelto  therapy. Pt admission INR of 6.1 and his subsequent INR should not be used to dictate whether his Xarelto  should be stopped or restarted.  04-13-2024 continue xarelto   Bradycardia 04-11-2024 seen by cardiology.  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 She wishes to only pursue invasive procedures if absolutely necessary, and verbalizes understanding that at this juncture, pacing  is not felt to be of a clear benefit. No further workup needed.  04-12-2024 stable. Stop telemetry.  04-13-2024 do not give any betablockers or calcium channel blockers such as cardizem or verapamil.  Hypomagnesemia 04-12-2024 give IV Mg today.  04-13-2024 Mg of 2.1 today. Resolved.   DVT prophylaxis: SCDs Start: 04/07/24 2124 rivaroxaban  (XARELTO ) tablet 20 mg     Code Status: Limited: Do not attempt resuscitation (DNR) -DNR-LIMITED -Do Not Intubate/DNI  Family Communication: no family at bedside Disposition Plan: SNF Reason for continuing need for hospitalization: stable for DC today.  Objective: Vitals:   04/12/24 2005 04/13/24 0430 04/13/24 0500 04/13/24 0740  BP: 111/60 (!)  164/67  (!) 173/68  Pulse: 77 63  64  Resp: 16 18  16   Temp: 98.1 F (36.7 C) 97.9 F (36.6 C)  97.7 F (36.5 C)  TempSrc: Oral Oral  Oral  SpO2: 97% 94%  96%  Weight:  71.2 kg 71.8 kg   Height:        Intake/Output Summary (Last 24 hours) at 04/13/2024 1258 Last data filed at 04/13/2024 0950 Gross per 24 hour  Intake 460 ml  Output --  Net 460 ml   Filed Weights   04/11/24 0425 04/13/24 0430 04/13/24 0500  Weight: 73.6 kg 71.2 kg 71.8 kg    Examination:  Physical Exam Vitals and nursing note reviewed.  Constitutional:      General: He is not in acute distress.    Appearance: He is not toxic-appearing or diaphoretic.  HENT:     Head: Normocephalic and atraumatic.  Eyes:     General: No scleral icterus. Cardiovascular:     Rate and Rhythm: Normal rate and regular rhythm.  Pulmonary:     Effort: Pulmonary effort is normal.  Abdominal:     General: Abdomen is flat. Bowel sounds are normal.  Musculoskeletal:     Right lower leg: No edema.     Left lower leg: No edema.  Skin:    General: Skin is warm and dry.     Capillary Refill: Capillary refill takes less than 2 seconds.  Neurological:     Mental Status: He is disoriented.     Comments: Pleasantly demented.     Data Reviewed: I have personally reviewed following labs and imaging studies  CBC: Recent Labs  Lab 04/07/24 1638 04/08/24 0322 04/09/24 0228 04/10/24 0239 04/11/24 0219  WBC 16.0* 12.6* 6.4 4.6 6.4  NEUTROABS 14.0*  --   --   --   --   HGB 11.6* 9.1* 9.6* 9.4* 9.8*  HCT 37.5* 28.9* 30.5* 30.5* 32.1*  MCV 92.4 88.4 88.9 89.7 90.4  PLT 216 159 149* 171 186   Basic Metabolic Panel: Recent Labs  Lab 04/07/24 1933 04/08/24 0322 04/09/24 0228 04/10/24 0239 04/11/24 0219 04/12/24 0442 04/13/24 0249  NA 134* 135 137 139 140  --   --   K 5.1 4.5 4.4 4.5 4.5  --   --   CL 102 105 106 111 108  --   --   CO2 23 22 22  20* 20*  --   --   GLUCOSE 101* 104* 95 104* 95  --   --   BUN 29* 30*  27* 21 15  --   --   CREATININE 1.80* 1.63* 1.31* 1.21 1.01  --   --   CALCIUM 9.5 8.8* 8.8* 9.1 8.6*  --   --   MG  --   --  1.7 1.8 1.7  1.6* 2.1   GFR: Estimated Creatinine Clearance: 50.6 mL/min (by C-G formula based on SCr of 1.01 mg/dL). Liver Function Tests: Recent Labs  Lab 04/07/24 1933 04/08/24 0322  AST 20 16  ALT 12 10  ALKPHOS 40 34*  BILITOT 1.4* 1.4*  PROT 7.1 5.7*  ALBUMIN 2.8* 2.2*   Coagulation Profile: Recent Labs  Lab 04/07/24 1638 04/08/24 0322 04/10/24 0239  INR 6.1* 4.8* 1.4*   CBG: Recent Labs  Lab 04/08/24 1421  GLUCAP 89   Sepsis Labs: Recent Labs  Lab 04/07/24 1733 04/07/24 1938 04/07/24 2304 04/08/24 0322  LATICACIDVEN 3.6* 2.4* 4.3* 1.7    Recent Results (from the past 240 hours)  Blood Culture (routine x 2)     Status: Abnormal   Collection Time: 04/07/24  5:00 PM   Specimen: BLOOD RIGHT HAND  Result Value Ref Range Status   Specimen Description BLOOD RIGHT HAND  Final   Special Requests   Final    BOTTLES DRAWN AEROBIC AND ANAEROBIC Blood Culture adequate volume   Culture  Setup Time   Final    GRAM NEGATIVE RODS IN BOTH AEROBIC AND ANAEROBIC BOTTLES Organism ID to follow CRITICAL RESULT CALLED TO, READ BACK BY AND VERIFIED WITH: PHARMD J LEDFORD 04/08/2024 @ 0701 BY AB Performed at [facility], [address] [place], [place] [address]    Culture ESCHERICHIA COLI (A)  Final   Report Status 04/10/2024 FINAL  Final   Organism ID, Bacteria ESCHERICHIA COLI  Final      Susceptibility   Escherichia coli - MIC*    AMPICILLIN  <=2 SENSITIVE Sensitive     CEFAZOLIN  (NON-URINE) 2 SENSITIVE Sensitive     CEFEPIME  <=0.12 SENSITIVE Sensitive     ERTAPENEM <=0.12 SENSITIVE Sensitive     CEFTRIAXONE  <=0.25 SENSITIVE Sensitive     CIPROFLOXACIN <=0.06 SENSITIVE Sensitive     GENTAMICIN <=1 SENSITIVE Sensitive     MEROPENEM <=0.25 SENSITIVE Sensitive     TRIMETH/SULFA <=20 SENSITIVE Sensitive     AMPICILLIN /SULBACTAM <=2  SENSITIVE Sensitive     PIP/TAZO Value in next row Sensitive ug/mL     <=4 SENSITIVEThis is a modified FDA-approved test that has been validated and its performance characteristics determined by the reporting laboratory.  This laboratory is certified under the Clinical Laboratory Improvement Amendments CLIA as qualified to perform high complexity clinical laboratory testing.    * ESCHERICHIA COLI  Blood Culture ID Panel (Reflexed)     Status: Abnormal   Collection Time: 04/07/24  5:00 PM  Result Value Ref Range Status   Enterococcus faecalis NOT DETECTED NOT DETECTED Final   Enterococcus Faecium NOT DETECTED NOT DETECTED Final   Listeria monocytogenes NOT DETECTED NOT DETECTED Final   Staphylococcus species NOT DETECTED NOT DETECTED Final   Staphylococcus aureus (BCID) NOT DETECTED NOT DETECTED Final   Staphylococcus epidermidis NOT DETECTED NOT DETECTED Final   Staphylococcus lugdunensis NOT DETECTED NOT DETECTED Final   Streptococcus species NOT DETECTED NOT DETECTED Final   Streptococcus agalactiae NOT DETECTED NOT DETECTED Final   Streptococcus pneumoniae NOT DETECTED NOT DETECTED Final   Streptococcus pyogenes NOT DETECTED NOT DETECTED Final   A.calcoaceticus-baumannii NOT DETECTED NOT DETECTED Final   Bacteroides fragilis NOT DETECTED NOT DETECTED Final   Enterobacterales DETECTED (A) NOT DETECTED Final    Comment: Enterobacterales represent a large order of gram negative bacteria, not a single organism. CRITICAL RESULT CALLED TO, READ BACK BY AND VERIFIED WITH: PHARMD J LEDFORD 04/08/2024 @ 0701 BY AB  Enterobacter cloacae complex NOT DETECTED NOT DETECTED Final   Escherichia coli DETECTED (A) NOT DETECTED Final    Comment: CRITICAL RESULT CALLED TO, READ BACK BY AND VERIFIED WITH: PHARMD J LEDFORD 04/08/2024 @ 0701 BY AB    Klebsiella aerogenes NOT DETECTED NOT DETECTED Final   Klebsiella oxytoca NOT DETECTED NOT DETECTED Final   Klebsiella pneumoniae NOT DETECTED NOT DETECTED  Final   Proteus species NOT DETECTED NOT DETECTED Final   Salmonella species NOT DETECTED NOT DETECTED Final   Serratia marcescens NOT DETECTED NOT DETECTED Final   Haemophilus influenzae NOT DETECTED NOT DETECTED Final   Neisseria meningitidis NOT DETECTED NOT DETECTED Final   Pseudomonas aeruginosa NOT DETECTED NOT DETECTED Final   Stenotrophomonas maltophilia NOT DETECTED NOT DETECTED Final   Candida albicans NOT DETECTED NOT DETECTED Final   Candida auris NOT DETECTED NOT DETECTED Final   Candida glabrata NOT DETECTED NOT DETECTED Final   Candida krusei NOT DETECTED NOT DETECTED Final   Candida parapsilosis NOT DETECTED NOT DETECTED Final   Candida tropicalis NOT DETECTED NOT DETECTED Final   Cryptococcus neoformans/gattii NOT DETECTED NOT DETECTED Final   CTX-M ESBL NOT DETECTED NOT DETECTED Final   Carbapenem resistance IMP NOT DETECTED NOT DETECTED Final   Carbapenem resistance KPC NOT DETECTED NOT DETECTED Final   Carbapenem resistance NDM NOT DETECTED NOT DETECTED Final   Carbapenem resist OXA 48 LIKE NOT DETECTED NOT DETECTED Final   Carbapenem resistance VIM NOT DETECTED NOT DETECTED Final    Comment: Performed at [facility], [address] [place], [place] [address]  Blood Culture (routine x 2)     Status: Abnormal   Collection Time: 04/07/24  5:14 PM   Specimen: BLOOD RIGHT FOREARM  Result Value Ref Range Status   Specimen Description BLOOD RIGHT FOREARM  Final   Special Requests   Final    BOTTLES DRAWN AEROBIC ONLY Blood Culture results may not be optimal due to an inadequate volume of blood received in culture bottles   Culture  Setup Time   Final    GRAM NEGATIVE RODS AEROBIC BOTTLE ONLY CRITICAL VALUE NOTED.  VALUE IS CONSISTENT WITH PREVIOUSLY REPORTED AND CALLED VALUE.    Culture (A)  Final    ESCHERICHIA COLI SUSCEPTIBILITIES PERFORMED ON PREVIOUS CULTURE WITHIN THE LAST 5 DAYS. Performed at [facility], [address] [place], [place]  [address]    Report Status 04/10/2024 FINAL  Final  Resp panel by RT-PCR (RSV, Flu A&B, Covid) Anterior Nasal Swab     Status: Abnormal   Collection Time: 04/07/24  5:21 PM   Specimen: Anterior Nasal Swab  Result Value Ref Range Status   SARS Coronavirus 2 by RT PCR POSITIVE (A) NEGATIVE Final   Influenza A by PCR NEGATIVE NEGATIVE Final   Influenza B by PCR NEGATIVE NEGATIVE Final    Comment: (NOTE) The Xpert Xpress SARS-CoV-2/FLU/RSV plus assay is intended as an aid in the diagnosis of influenza from Nasopharyngeal swab specimens and should not be used as a sole basis for treatment. Nasal washings and aspirates are unacceptable for Xpert Xpress SARS-CoV-2/FLU/RSV testing.  Fact Sheet for Patients: BloggerCourse.com  Fact Sheet for Healthcare Providers: SeriousBroker.it  This test is not yet approved or cleared by the [place]  FDA and has been authorized for detection and/or diagnosis of SARS-CoV-2 by FDA under an Emergency Use Authorization (EUA). This EUA will remain in effect (meaning this test can be used) for the duration of the COVID-19 declaration  under Section 564(b)(1) of the Act, 21 U.S.C. section 360bbb-3(b)(1), unless the authorization is terminated or revoked.     Resp Syncytial Virus by PCR NEGATIVE NEGATIVE Final    Comment: (NOTE) Fact Sheet for Patients: BloggerCourse.com  Fact Sheet for Healthcare Providers: SeriousBroker.it  This test is not yet approved or cleared by the [place]  FDA and has been authorized for detection and/or diagnosis of SARS-CoV-2 by FDA under an Emergency Use Authorization (EUA). This EUA will remain in effect (meaning this test can be used) for the duration of the COVID-19 declaration under Section 564(b)(1) of the Act, 21 U.S.C. section 360bbb-3(b)(1), unless the authorization is terminated or revoked.  Performed at [facility], [address], [place], [place] [address]   Urine Culture     Status: Abnormal   Collection Time: 04/07/24  8:38 PM   Specimen: Urine, Random  Result Value Ref Range Status   Specimen Description URINE, RANDOM  Final   Special Requests NONE Reflexed from [id number]  Final   Culture (A)  Final    <10,000 COLONIES/mL INSIGNIFICANT GROWTH Performed at [facility], [address] [place], [place] [address]    Report Status 04/08/2024 FINAL  Final  MRSA Next Gen by PCR, Nasal     Status: None   Collection Time: 04/07/24 11:14 PM   Specimen: Nasal Mucosa; Nasal Swab  Result Value Ref Range Status   MRSA by PCR Next Gen NOT DETECTED NOT DETECTED Final    Comment: (NOTE) The GeneXpert MRSA Assay (FDA approved for NASAL specimens only), is one component of a comprehensive MRSA colonization surveillance program. It is not intended to diagnose MRSA infection nor to guide or monitor treatment for MRSA infections. Test performance is not FDA approved in patients less than 82 years old. Performed at [facility], [address] [place], [place] [address]     Scheduled Meds:  amLODipine   5 mg Oral Daily   cefadroxil   1,000 mg Oral BID   feeding supplement  237 mL Oral TID BM   hydrALAZINE   25 mg Oral TID   latanoprost   1 drop Both Eyes QHS   multivitamin with minerals  1 tablet Oral Daily   pantoprazole   40 mg Oral Daily   rivaroxaban   20 mg Oral Q supper   sodium chloride  flush  3 mL Intravenous Q12H   Continuous Infusions:   LOS: 6 days   Time spent: 60 minutes  Camellia Door, DO  [facility]  04/13/2024, 12:58 PM

## 2024-04-13 NOTE — Progress Notes (Signed)
 Pt transefering with [facility] to [facility]. Report given to Desiree,LPN.

## 2024-04-16 DIAGNOSIS — Z86711 Personal history of pulmonary embolism: Secondary | ICD-10-CM | POA: Diagnosis not present

## 2024-04-16 DIAGNOSIS — Z6823 Body mass index (BMI) 23.0-23.9, adult: Secondary | ICD-10-CM | POA: Diagnosis not present

## 2024-04-16 DIAGNOSIS — I4430 Unspecified atrioventricular block: Secondary | ICD-10-CM | POA: Diagnosis not present

## 2024-04-16 DIAGNOSIS — I150 Renovascular hypertension: Secondary | ICD-10-CM | POA: Diagnosis not present

## 2024-04-16 DIAGNOSIS — I700 Atherosclerosis of aorta: Secondary | ICD-10-CM | POA: Diagnosis not present

## 2024-04-16 DIAGNOSIS — Z86718 Personal history of other venous thrombosis and embolism: Secondary | ICD-10-CM | POA: Diagnosis not present

## 2024-04-16 DIAGNOSIS — J449 Chronic obstructive pulmonary disease, unspecified: Secondary | ICD-10-CM | POA: Diagnosis not present

## 2024-04-16 DIAGNOSIS — B9620 Unspecified Escherichia coli [E. coli] as the cause of diseases classified elsewhere: Secondary | ICD-10-CM | POA: Diagnosis not present

## 2024-04-16 DIAGNOSIS — F0390 Unspecified dementia without behavioral disturbance: Secondary | ICD-10-CM | POA: Diagnosis not present

## 2024-04-16 DIAGNOSIS — D6859 Other primary thrombophilia: Secondary | ICD-10-CM | POA: Diagnosis not present

## 2024-04-16 DIAGNOSIS — E440 Moderate protein-calorie malnutrition: Secondary | ICD-10-CM | POA: Diagnosis not present

## 2024-04-16 DIAGNOSIS — Z7901 Long term (current) use of anticoagulants: Secondary | ICD-10-CM | POA: Diagnosis not present

## 2024-04-16 DIAGNOSIS — R5381 Other malaise: Secondary | ICD-10-CM | POA: Diagnosis not present

## 2024-04-16 DIAGNOSIS — N390 Urinary tract infection, site not specified: Secondary | ICD-10-CM | POA: Diagnosis not present

## 2024-04-16 DIAGNOSIS — I10 Essential (primary) hypertension: Secondary | ICD-10-CM | POA: Diagnosis not present

## 2024-04-18 DIAGNOSIS — B9620 Unspecified Escherichia coli [E. coli] as the cause of diseases classified elsewhere: Secondary | ICD-10-CM | POA: Diagnosis not present

## 2024-04-18 DIAGNOSIS — I700 Atherosclerosis of aorta: Secondary | ICD-10-CM | POA: Diagnosis not present

## 2024-04-18 DIAGNOSIS — N390 Urinary tract infection, site not specified: Secondary | ICD-10-CM | POA: Diagnosis not present

## 2024-04-18 DIAGNOSIS — I4430 Unspecified atrioventricular block: Secondary | ICD-10-CM | POA: Diagnosis not present

## 2024-04-18 DIAGNOSIS — R5381 Other malaise: Secondary | ICD-10-CM | POA: Diagnosis not present

## 2024-04-18 DIAGNOSIS — D6859 Other primary thrombophilia: Secondary | ICD-10-CM | POA: Diagnosis not present

## 2024-04-18 DIAGNOSIS — J449 Chronic obstructive pulmonary disease, unspecified: Secondary | ICD-10-CM | POA: Diagnosis not present

## 2024-04-18 DIAGNOSIS — I150 Renovascular hypertension: Secondary | ICD-10-CM | POA: Diagnosis not present

## 2024-04-18 DIAGNOSIS — Z7901 Long term (current) use of anticoagulants: Secondary | ICD-10-CM | POA: Diagnosis not present

## 2024-04-25 DIAGNOSIS — J449 Chronic obstructive pulmonary disease, unspecified: Secondary | ICD-10-CM | POA: Diagnosis not present

## 2024-04-25 DIAGNOSIS — Z86711 Personal history of pulmonary embolism: Secondary | ICD-10-CM | POA: Diagnosis not present

## 2024-04-25 DIAGNOSIS — R5381 Other malaise: Secondary | ICD-10-CM | POA: Diagnosis not present

## 2024-04-25 DIAGNOSIS — Z86718 Personal history of other venous thrombosis and embolism: Secondary | ICD-10-CM | POA: Diagnosis not present

## 2024-04-25 DIAGNOSIS — I150 Renovascular hypertension: Secondary | ICD-10-CM | POA: Diagnosis not present

## 2024-04-25 DIAGNOSIS — Z7901 Long term (current) use of anticoagulants: Secondary | ICD-10-CM | POA: Diagnosis not present

## 2024-04-30 DIAGNOSIS — C679 Malignant neoplasm of bladder, unspecified: Secondary | ICD-10-CM | POA: Diagnosis not present

## 2024-04-30 DIAGNOSIS — F0390 Unspecified dementia without behavioral disturbance: Secondary | ICD-10-CM | POA: Diagnosis not present

## 2024-04-30 DIAGNOSIS — U071 COVID-19: Secondary | ICD-10-CM | POA: Diagnosis not present

## 2024-04-30 DIAGNOSIS — J690 Pneumonitis due to inhalation of food and vomit: Secondary | ICD-10-CM | POA: Diagnosis not present

## 2024-04-30 DIAGNOSIS — N1330 Unspecified hydronephrosis: Secondary | ICD-10-CM | POA: Diagnosis not present

## 2024-04-30 DIAGNOSIS — N179 Acute kidney failure, unspecified: Secondary | ICD-10-CM | POA: Diagnosis not present

## 2024-04-30 DIAGNOSIS — C61 Malignant neoplasm of prostate: Secondary | ICD-10-CM | POA: Diagnosis not present

## 2024-04-30 DIAGNOSIS — D630 Anemia in neoplastic disease: Secondary | ICD-10-CM | POA: Diagnosis not present

## 2024-04-30 DIAGNOSIS — J1282 Pneumonia due to coronavirus disease 2019: Secondary | ICD-10-CM | POA: Diagnosis not present

## 2024-05-01 DIAGNOSIS — J439 Emphysema, unspecified: Secondary | ICD-10-CM | POA: Diagnosis not present

## 2024-05-01 DIAGNOSIS — N1330 Unspecified hydronephrosis: Secondary | ICD-10-CM | POA: Diagnosis not present

## 2024-05-01 DIAGNOSIS — C61 Malignant neoplasm of prostate: Secondary | ICD-10-CM | POA: Diagnosis not present

## 2024-05-01 DIAGNOSIS — J690 Pneumonitis due to inhalation of food and vomit: Secondary | ICD-10-CM | POA: Diagnosis not present

## 2024-05-01 DIAGNOSIS — U071 COVID-19: Secondary | ICD-10-CM | POA: Diagnosis not present

## 2024-05-01 DIAGNOSIS — N179 Acute kidney failure, unspecified: Secondary | ICD-10-CM | POA: Diagnosis not present

## 2024-05-01 DIAGNOSIS — C679 Malignant neoplasm of bladder, unspecified: Secondary | ICD-10-CM | POA: Diagnosis not present

## 2024-05-01 DIAGNOSIS — J1282 Pneumonia due to coronavirus disease 2019: Secondary | ICD-10-CM | POA: Diagnosis not present

## 2024-05-07 DIAGNOSIS — U071 COVID-19: Secondary | ICD-10-CM | POA: Diagnosis not present

## 2024-05-07 DIAGNOSIS — N179 Acute kidney failure, unspecified: Secondary | ICD-10-CM | POA: Diagnosis not present

## 2024-05-07 DIAGNOSIS — J1282 Pneumonia due to coronavirus disease 2019: Secondary | ICD-10-CM | POA: Diagnosis not present

## 2024-05-07 DIAGNOSIS — N1330 Unspecified hydronephrosis: Secondary | ICD-10-CM | POA: Diagnosis not present

## 2024-05-07 DIAGNOSIS — C679 Malignant neoplasm of bladder, unspecified: Secondary | ICD-10-CM | POA: Diagnosis not present

## 2024-05-07 DIAGNOSIS — J690 Pneumonitis due to inhalation of food and vomit: Secondary | ICD-10-CM | POA: Diagnosis not present

## 2024-05-07 DIAGNOSIS — F0390 Unspecified dementia without behavioral disturbance: Secondary | ICD-10-CM | POA: Diagnosis not present

## 2024-05-07 DIAGNOSIS — D630 Anemia in neoplastic disease: Secondary | ICD-10-CM | POA: Diagnosis not present

## 2024-05-10 DIAGNOSIS — C679 Malignant neoplasm of bladder, unspecified: Secondary | ICD-10-CM | POA: Diagnosis not present

## 2024-05-10 DIAGNOSIS — N1330 Unspecified hydronephrosis: Secondary | ICD-10-CM | POA: Diagnosis not present

## 2024-05-10 DIAGNOSIS — J1282 Pneumonia due to coronavirus disease 2019: Secondary | ICD-10-CM | POA: Diagnosis not present

## 2024-05-10 DIAGNOSIS — J690 Pneumonitis due to inhalation of food and vomit: Secondary | ICD-10-CM | POA: Diagnosis not present

## 2024-05-10 DIAGNOSIS — D630 Anemia in neoplastic disease: Secondary | ICD-10-CM | POA: Diagnosis not present

## 2024-05-10 DIAGNOSIS — F0390 Unspecified dementia without behavioral disturbance: Secondary | ICD-10-CM | POA: Diagnosis not present

## 2024-05-10 DIAGNOSIS — N179 Acute kidney failure, unspecified: Secondary | ICD-10-CM | POA: Diagnosis not present

## 2024-05-10 DIAGNOSIS — U071 COVID-19: Secondary | ICD-10-CM | POA: Diagnosis not present

## 2024-05-14 DIAGNOSIS — D630 Anemia in neoplastic disease: Secondary | ICD-10-CM | POA: Diagnosis not present

## 2024-05-14 DIAGNOSIS — C679 Malignant neoplasm of bladder, unspecified: Secondary | ICD-10-CM | POA: Diagnosis not present

## 2024-05-14 DIAGNOSIS — C61 Malignant neoplasm of prostate: Secondary | ICD-10-CM | POA: Diagnosis not present

## 2024-05-14 DIAGNOSIS — U071 COVID-19: Secondary | ICD-10-CM | POA: Diagnosis not present

## 2024-05-14 DIAGNOSIS — J1282 Pneumonia due to coronavirus disease 2019: Secondary | ICD-10-CM | POA: Diagnosis not present

## 2024-05-14 DIAGNOSIS — N1330 Unspecified hydronephrosis: Secondary | ICD-10-CM | POA: Diagnosis not present

## 2024-05-14 DIAGNOSIS — J690 Pneumonitis due to inhalation of food and vomit: Secondary | ICD-10-CM | POA: Diagnosis not present

## 2024-05-14 DIAGNOSIS — F0390 Unspecified dementia without behavioral disturbance: Secondary | ICD-10-CM | POA: Diagnosis not present

## 2024-05-14 DIAGNOSIS — N179 Acute kidney failure, unspecified: Secondary | ICD-10-CM | POA: Diagnosis not present

## 2024-05-15 ENCOUNTER — Ambulatory Visit: Admitting: Podiatry

## 2024-05-17 DIAGNOSIS — J690 Pneumonitis due to inhalation of food and vomit: Secondary | ICD-10-CM | POA: Diagnosis not present

## 2024-05-23 DIAGNOSIS — D630 Anemia in neoplastic disease: Secondary | ICD-10-CM | POA: Diagnosis not present

## 2024-06-16 DEATH — deceased
